# Patient Record
Sex: Female | Born: 1990 | Race: Black or African American | Hispanic: No | Smoking: Never smoker
Health system: Southern US, Community
[De-identification: ages and names within clinical notes are randomized; demographics above are authoritative.]

## PROBLEM LIST (undated history)

## (undated) DIAGNOSIS — E1110 Type 2 diabetes mellitus with ketoacidosis without coma: Secondary | ICD-10-CM

## (undated) DIAGNOSIS — I10 Essential (primary) hypertension: Secondary | ICD-10-CM

## (undated) DIAGNOSIS — T4145XA Adverse effect of unspecified anesthetic, initial encounter: Secondary | ICD-10-CM

## (undated) DIAGNOSIS — T8859XA Other complications of anesthesia, initial encounter: Secondary | ICD-10-CM

## (undated) DIAGNOSIS — N186 End stage renal disease: Secondary | ICD-10-CM

## (undated) DIAGNOSIS — I639 Cerebral infarction, unspecified: Secondary | ICD-10-CM

## (undated) DIAGNOSIS — Z9889 Other specified postprocedural states: Secondary | ICD-10-CM

## (undated) DIAGNOSIS — R112 Nausea with vomiting, unspecified: Secondary | ICD-10-CM

## (undated) DIAGNOSIS — E109 Type 1 diabetes mellitus without complications: Secondary | ICD-10-CM

## (undated) DIAGNOSIS — I469 Cardiac arrest, cause unspecified: Secondary | ICD-10-CM

## (undated) HISTORY — DX: Cardiac arrest, cause unspecified: I46.9

## (undated) HISTORY — PX: CATARACT EXTRACTION: SUR2

## (undated) HISTORY — DX: Type 2 diabetes mellitus with ketoacidosis without coma: E11.10

## (undated) HISTORY — PX: DIALYSIS FISTULA CREATION: SHX611

---

## 2011-05-07 ENCOUNTER — Emergency Department (INDEPENDENT_AMBULATORY_CARE_PROVIDER_SITE_OTHER)
Admission: EM | Admit: 2011-05-07 | Discharge: 2011-05-07 | Disposition: A | Discharge status: Home / Self Care | Payer: BC Managed Care – PPO | Source: Home / Self Care | Attending: Family Medicine | Admitting: Family Medicine

## 2011-05-07 ENCOUNTER — Emergency Department (INDEPENDENT_AMBULATORY_CARE_PROVIDER_SITE_OTHER): Payer: BC Managed Care – PPO

## 2011-05-07 DIAGNOSIS — M79609 Pain in unspecified limb: Secondary | ICD-10-CM

## 2011-05-07 DIAGNOSIS — M79671 Pain in right foot: Secondary | ICD-10-CM

## 2011-05-07 MED ORDER — HYDROCODONE-ACETAMINOPHEN 5-325 MG PO TABS: ORAL_TABLET | ORAL | Status: AC

## 2011-05-07 NOTE — ED Notes (Signed)
Pt states she had high heels on- stepped up on curb and rolled her rt foot last night.  C/o pain, bruising and swelling to rt lateral foot

## 2011-05-07 NOTE — ED Provider Notes (Signed)
History     CSN: [id number] Arrival date & time: 05/07/2011  5:29 PM   First MD Initiated Contact with Patient 05/07/11 1702      Chief Complaint  Patient presents with  . Foot Pain    (Consider location/radiation/quality/duration/timing/severity/associated sxs/prior treatment) Patient is a 20 y.o. female presenting with lower extremity pain. The history is provided by the patient.  Foot Pain This is a new problem. The current episode started 12 to 24 hours ago. The problem occurs constantly. The problem has not changed since onset.The symptoms are aggravated by walking, standing and exertion. The symptoms are relieved by nothing.    Past Medical History  Diagnosis Date  . Diabetes mellitus     Past Surgical History  Procedure Date  . Cataract extraction     No family history on file.  History  Substance Use Topics  . Smoking status: Never Smoker   . Smokeless tobacco: Not on file  . Alcohol Use: Yes     occasional    OB History    Grav Para Term Preterm Abortions TAB SAB Ect Mult Living                  Review of Systems  Constitutional: Negative.   HENT: Negative.   Eyes: Negative.   Respiratory: Negative.   Musculoskeletal: Positive for gait problem.  Neurological: Negative.     Allergies  Review of patient's allergies indicates no known allergies.  Home Medications   Current Outpatient Rx  Name Route Sig Dispense Refill  . IBUPROFEN 200 MG PO TABS Oral Take 400 mg by mouth as needed.        BP 115/79  Pulse 94  Temp(Src) 98.9 F (37.2 C) (Oral)  Resp 16  SpO2 96%  LMP 04/04/2011  Physical Exam  Constitutional: She is oriented to person, place, and time. She appears well-developed and well-nourished.  HENT:  Head: Normocephalic and atraumatic.  Eyes: EOM are normal.  Neck: Normal range of motion.  Musculoskeletal:       Right foot: She exhibits tenderness and bony tenderness. She exhibits normal range of motion.        Feet:  Neurological: She is alert and oriented to person, place, and time.  Skin: Skin is warm and dry.    ED Course  Procedures (including critical care time)  Labs Reviewed - No data to display No results found.   No diagnosis found.    MDM  RIGHT foot xray: negative for fracture or dislocation        Marcell Anger, MD 05/07/11 1836

## 2013-12-05 ENCOUNTER — Inpatient Hospital Stay (HOSPITAL_COMMUNITY)
Admission: EM | Admit: 2013-12-05 | Discharge: 2013-12-09 | DRG: 638 | Disposition: A | Discharge status: Home / Self Care | Payer: Medicaid Other | Attending: Internal Medicine | Admitting: Internal Medicine

## 2013-12-05 ENCOUNTER — Inpatient Hospital Stay (HOSPITAL_COMMUNITY): Payer: No Typology Code available for payment source

## 2013-12-05 ENCOUNTER — Encounter (HOSPITAL_COMMUNITY): Payer: Self-pay | Admitting: Emergency Medicine

## 2013-12-05 DIAGNOSIS — E872 Acidosis, unspecified: Secondary | ICD-10-CM | POA: Diagnosis not present

## 2013-12-05 DIAGNOSIS — E870 Hyperosmolality and hypernatremia: Secondary | ICD-10-CM | POA: Diagnosis present

## 2013-12-05 DIAGNOSIS — E1110 Type 2 diabetes mellitus with ketoacidosis without coma: Secondary | ICD-10-CM | POA: Diagnosis present

## 2013-12-05 DIAGNOSIS — D72829 Elevated white blood cell count, unspecified: Secondary | ICD-10-CM | POA: Diagnosis not present

## 2013-12-05 DIAGNOSIS — E8729 Other acidosis: Secondary | ICD-10-CM

## 2013-12-05 DIAGNOSIS — E1029 Type 1 diabetes mellitus with other diabetic kidney complication: Secondary | ICD-10-CM | POA: Diagnosis present

## 2013-12-05 DIAGNOSIS — E1310 Other specified diabetes mellitus with ketoacidosis without coma: Principal | ICD-10-CM

## 2013-12-05 DIAGNOSIS — R748 Abnormal levels of other serum enzymes: Secondary | ICD-10-CM

## 2013-12-05 DIAGNOSIS — N179 Acute kidney failure, unspecified: Secondary | ICD-10-CM | POA: Diagnosis not present

## 2013-12-05 DIAGNOSIS — E1065 Type 1 diabetes mellitus with hyperglycemia: Secondary | ICD-10-CM

## 2013-12-05 DIAGNOSIS — Z794 Long term (current) use of insulin: Secondary | ICD-10-CM

## 2013-12-05 DIAGNOSIS — N058 Unspecified nephritic syndrome with other morphologic changes: Secondary | ICD-10-CM | POA: Diagnosis present

## 2013-12-05 DIAGNOSIS — Z9119 Patient's noncompliance with other medical treatment and regimen: Secondary | ICD-10-CM

## 2013-12-05 DIAGNOSIS — E860 Dehydration: Secondary | ICD-10-CM

## 2013-12-05 DIAGNOSIS — E1165 Type 2 diabetes mellitus with hyperglycemia: Secondary | ICD-10-CM | POA: Diagnosis present

## 2013-12-05 DIAGNOSIS — N189 Chronic kidney disease, unspecified: Secondary | ICD-10-CM

## 2013-12-05 DIAGNOSIS — R7989 Other specified abnormal findings of blood chemistry: Secondary | ICD-10-CM

## 2013-12-05 DIAGNOSIS — IMO0002 Reserved for concepts with insufficient information to code with codable children: Secondary | ICD-10-CM | POA: Diagnosis present

## 2013-12-05 DIAGNOSIS — E1129 Type 2 diabetes mellitus with other diabetic kidney complication: Secondary | ICD-10-CM

## 2013-12-05 DIAGNOSIS — E876 Hypokalemia: Secondary | ICD-10-CM

## 2013-12-05 DIAGNOSIS — Z91199 Patient's noncompliance with other medical treatment and regimen due to unspecified reason: Secondary | ICD-10-CM

## 2013-12-05 DIAGNOSIS — R945 Abnormal results of liver function studies: Secondary | ICD-10-CM

## 2013-12-05 DIAGNOSIS — R0682 Tachypnea, not elsewhere classified: Secondary | ICD-10-CM | POA: Diagnosis present

## 2013-12-05 LAB — COMPREHENSIVE METABOLIC PANEL
ALBUMIN: 4.2 g/dL (ref 3.5–5.2)
ALK PHOS: 158 U/L — AB (ref 39–117)
ALT: 37 U/L — AB (ref 0–35)
AST: 38 U/L — AB (ref 0–37)
BUN: 17 mg/dL (ref 6–23)
CHLORIDE: 93 meq/L — AB (ref 96–112)
CREATININE: 1.4 mg/dL — AB (ref 0.50–1.10)
Calcium: 10.7 mg/dL — ABNORMAL HIGH (ref 8.4–10.5)
GFR calc Af Amer: 61 mL/min — ABNORMAL LOW (ref >90)
GFR, EST NON AFRICAN AMERICAN: 53 mL/min — AB (ref >90)
Glucose, Bld: 861 mg/dL (ref 70–99)
POTASSIUM: 5.1 meq/L (ref 3.7–5.3)
SODIUM: 142 meq/L (ref 137–147)
Total Protein: 8.8 g/dL — ABNORMAL HIGH (ref 6.0–8.3)

## 2013-12-05 LAB — CBG MONITORING, ED: Glucose-Capillary: >600 mg/dL (ref 70–99)

## 2013-12-05 LAB — URINALYSIS, ROUTINE W REFLEX MICROSCOPIC
LEUKOCYTES UA: NEGATIVE
Nitrite: NEGATIVE
PH: 5 (ref 5.0–8.0)
PROTEIN: 100 mg/dL — AB
Specific Gravity, Urine: 1.027 (ref 1.005–1.030)
Urobilinogen, UA: 0.2 mg/dL (ref 0.0–1.0)

## 2013-12-05 LAB — CBC WITH DIFFERENTIAL/PLATELET
BASOS ABS: 0 10*3/uL (ref 0.0–0.1)
BASOS PCT: 0 % (ref 0–1)
Eosinophils Absolute: 0 10*3/uL (ref 0.0–0.7)
Eosinophils Relative: 0 % (ref 0–5)
HEMATOCRIT: 36.4 % (ref 36.0–46.0)
Hemoglobin: 11.9 g/dL — ABNORMAL LOW (ref 12.0–15.0)
LYMPHS PCT: 7 % — AB (ref 12–46)
Lymphs Abs: 0.8 10*3/uL (ref 0.7–4.0)
MCH: 29.8 pg (ref 26.0–34.0)
MCHC: 32.7 g/dL (ref 30.0–36.0)
MCV: 91 fL (ref 78.0–100.0)
MONO ABS: 1.2 10*3/uL — AB (ref 0.1–1.0)
Monocytes Relative: 10 % (ref 3–12)
NEUTROS ABS: 9.9 10*3/uL — AB (ref 1.7–7.7)
NEUTROS PCT: 83 % — AB (ref 43–77)
Platelets: 431 10*3/uL — ABNORMAL HIGH (ref 150–400)
RBC: 4 MIL/uL (ref 3.87–5.11)
RDW: 13.5 % (ref 11.5–15.5)
WBC: 11.9 10*3/uL — AB (ref 4.0–10.5)

## 2013-12-05 LAB — CBC
HCT: 31.8 % — ABNORMAL LOW (ref 36.0–46.0)
HEMOGLOBIN: 10.3 g/dL — AB (ref 12.0–15.0)
MCH: 29.6 pg (ref 26.0–34.0)
MCHC: 32.4 g/dL (ref 30.0–36.0)
MCV: 91.4 fL (ref 78.0–100.0)
Platelets: 355 10*3/uL (ref 150–400)
RBC: 3.48 MIL/uL — AB (ref 3.87–5.11)
RDW: 13.7 % (ref 11.5–15.5)
WBC: 12.2 10*3/uL — ABNORMAL HIGH (ref 4.0–10.5)

## 2013-12-05 LAB — LIPASE, BLOOD: Lipase: 1455 U/L — ABNORMAL HIGH (ref 11–59)

## 2013-12-05 LAB — BASIC METABOLIC PANEL
BUN: 18 mg/dL (ref 6–23)
CO2: <7 mEq/L — CL (ref 19–32)
Calcium: 9.2 mg/dL (ref 8.4–10.5)
Chloride: 99 mEq/L (ref 96–112)
Creatinine, Ser: 1.29 mg/dL — ABNORMAL HIGH (ref 0.50–1.10)
GFR calc Af Amer: 68 mL/min — ABNORMAL LOW (ref >90)
GFR, EST NON AFRICAN AMERICAN: 58 mL/min — AB (ref >90)
Glucose, Bld: 840 mg/dL (ref 70–99)
POTASSIUM: 5.1 meq/L (ref 3.7–5.3)
Sodium: 144 mEq/L (ref 137–147)

## 2013-12-05 LAB — I-STAT VENOUS BLOOD GAS, ED
Acid-base deficit: 21 mmol/L — ABNORMAL HIGH (ref 0.0–2.0)
BICARBONATE: 5.9 meq/L — AB (ref 20.0–24.0)
O2 Saturation: 71 %
PCO2 VEN: 18 mmHg — AB (ref 45.0–50.0)
PH VEN: 7.126 — AB (ref 7.250–7.300)
TCO2: 6 mmol/L (ref 0–100)
pO2, Ven: 47 mmHg — ABNORMAL HIGH (ref 30.0–45.0)

## 2013-12-05 LAB — URINE MICROSCOPIC-ADD ON

## 2013-12-05 LAB — POC URINE PREG, ED: Preg Test, Ur: NEGATIVE

## 2013-12-05 LAB — ETHANOL: Alcohol, Ethyl (B): <11 mg/dL (ref 0–11)

## 2013-12-05 LAB — KETONES, QUALITATIVE

## 2013-12-05 MED ORDER — ACETAMINOPHEN 325 MG PO TABS: 650.0000 mg | ORAL_TABLET | Freq: Four times a day (QID) | ORAL | Status: DC | PRN

## 2013-12-05 MED ORDER — ONDANSETRON HCL 4 MG PO TABS: 4.0000 mg | ORAL_TABLET | Freq: Four times a day (QID) | ORAL | Status: DC | PRN

## 2013-12-05 MED ORDER — DEXTROSE-NACL 5-0.45 % IV SOLN: INTRAVENOUS | Status: DC

## 2013-12-05 MED ORDER — ONDANSETRON HCL 4 MG/2ML IJ SOLN
4.0000 mg | Freq: Four times a day (QID) | INTRAMUSCULAR | Status: DC | PRN
  Administered 2013-12-06 (×2): 4 mg via INTRAVENOUS
  Filled 2013-12-05 (×2): qty 2

## 2013-12-05 MED ORDER — MORPHINE SULFATE 2 MG/ML IJ SOLN
2.0000 mg | INTRAMUSCULAR | Status: DC | PRN
  Administered 2013-12-05: 2 mg via INTRAVENOUS
  Filled 2013-12-05: qty 1

## 2013-12-05 MED ORDER — SODIUM CHLORIDE 0.9 % IV BOLUS (SEPSIS)
1000.0000 mL | Freq: Once | INTRAVENOUS | Status: AC
  Administered 2013-12-05: 1000 mL via INTRAVENOUS

## 2013-12-05 MED ORDER — DEXTROSE 50 % IV SOLN: 25.0000 mL | INTRAVENOUS | Status: DC | PRN

## 2013-12-05 MED ORDER — SODIUM CHLORIDE 0.9 % IV SOLN
Freq: Once | INTRAVENOUS | Status: AC
  Administered 2013-12-05: 22:00:00 via INTRAVENOUS

## 2013-12-05 MED ORDER — ACETAMINOPHEN 650 MG RE SUPP: 650.0000 mg | Freq: Four times a day (QID) | RECTAL | Status: DC | PRN

## 2013-12-05 MED ORDER — ONDANSETRON HCL 4 MG/2ML IJ SOLN
4.0000 mg | Freq: Once | INTRAMUSCULAR | Status: AC
  Administered 2013-12-05: 4 mg via INTRAVENOUS
  Filled 2013-12-05: qty 2

## 2013-12-05 MED ORDER — SODIUM CHLORIDE 0.9 % IV BOLUS (SEPSIS)
1000.0000 mL | Freq: Once | INTRAVENOUS | Status: AC
Start: 2013-12-05 — End: 2013-12-05
  Administered 2013-12-05: 1000 mL via INTRAVENOUS

## 2013-12-05 MED ORDER — ONDANSETRON 4 MG PO TBDP
8.0000 mg | ORAL_TABLET | Freq: Once | ORAL | Status: AC
  Administered 2013-12-05: 8 mg via ORAL
  Filled 2013-12-05: qty 2

## 2013-12-05 MED ORDER — HYDROCODONE-ACETAMINOPHEN 5-325 MG PO TABS: 1.0000 | ORAL_TABLET | ORAL | Status: DC | PRN

## 2013-12-05 MED ORDER — SODIUM CHLORIDE 0.9 % IV SOLN
INTRAVENOUS | Status: DC
Start: 2013-12-05 — End: 2013-12-06
  Administered 2013-12-05: 23:00:00 via INTRAVENOUS

## 2013-12-05 MED ORDER — METOCLOPRAMIDE HCL 5 MG/ML IJ SOLN
10.0000 mg | Freq: Once | INTRAMUSCULAR | Status: AC
  Administered 2013-12-05: 10 mg via INTRAVENOUS
  Filled 2013-12-05: qty 2

## 2013-12-05 MED ORDER — DEXTROSE-NACL 5-0.45 % IV SOLN
INTRAVENOUS | Status: DC
  Administered 2013-12-06: 05:00:00 via INTRAVENOUS

## 2013-12-05 MED ORDER — SODIUM CHLORIDE 0.9 % IV SOLN
INTRAVENOUS | Status: DC
  Filled 2013-12-05: qty 1

## 2013-12-05 MED ORDER — SODIUM CHLORIDE 0.9 % IJ SOLN
3.0000 mL | Freq: Two times a day (BID) | INTRAMUSCULAR | Status: DC
Start: 2013-12-05 — End: 2013-12-09
  Administered 2013-12-06 – 2013-12-09 (×6): 3 mL via INTRAVENOUS

## 2013-12-05 MED ORDER — SODIUM CHLORIDE 0.9 % IV SOLN
INTRAVENOUS | Status: DC
  Administered 2013-12-05: 7.8 [IU]/h via INTRAVENOUS
  Administered 2013-12-06: 4.5 [IU]/h via INTRAVENOUS
  Filled 2013-12-05 (×3): qty 1

## 2013-12-05 MED ORDER — MORPHINE SULFATE 2 MG/ML IJ SOLN: 1.0000 mg | INTRAMUSCULAR | Status: DC | PRN

## 2013-12-05 NOTE — ED Notes (Signed)
Sent urine rapid drug screen down to the lab as an add on

## 2013-12-05 NOTE — ED Notes (Addendum)
Pt reports being diabetic and having n/v x 12 hours. cbg "high" at triage.

## 2013-12-05 NOTE — ED Notes (Signed)
Attempted blood draw at triage x 1 with no success.

## 2013-12-05 NOTE — ED Notes (Signed)
Pt placed into gown and on monitor upon arrival to room. Pt monitored by 5 lead, blood pressure, and pulse ox.  

## 2013-12-05 NOTE — ED Provider Notes (Signed)
CSN: [id number]     Arrival date & time 12/05/13  1809 History   First MD Initiated Contact with Patient 12/05/13 1902     Chief Complaint  Patient presents with  . Emesis      HPI  Patient presents with vomiting. She has a history of diabetes.  She does not have a local physician. She follows with an endocrinologist at [facility]. She reports not feeling well since yesterday with nausea. Denies pain. Started vomiting yesterday afternoon. The use of insulin last night. States her blood sugars over 500. She gave herself her irregular dosing, plus sliding scale. Awakened this morning after only about 2-3 hours of sleep secondary to refractory nausea and multiple episodes of vomiting. Her blood sugar was "high". She gave herself her morning insulin, plus another unspecified amount of regular insulin via her sliding scale. Nausea has persisted through the day with 3 or 4 episodes of additional vomiting. She is fatigued still nauseated. Said she feels like she has felt in the past when she has been in diabetic ketoacidosis.  Past Medical History  Diagnosis Date  . Diabetes mellitus    Past Surgical History  Procedure Laterality Date  . Cataract extraction     History reviewed. No pertinent family history. History  Substance Use Topics  . Smoking status: Never Smoker   . Smokeless tobacco: Not on file  . Alcohol Use: Yes     Comment: occasional   OB History   Grav Para Term Preterm Abortions TAB SAB Ect Mult Living                 Review of Systems  Constitutional: Positive for fatigue. Negative for fever, chills, diaphoresis and appetite change.  HENT: Negative for mouth sores, sore throat and trouble swallowing.   Eyes: Negative for visual disturbance.  Respiratory: Negative for cough, chest tightness, shortness of breath and wheezing.   Cardiovascular: Negative for chest pain.  Gastrointestinal: Positive for nausea and vomiting. Negative for abdominal pain, diarrhea and abdominal  distention.  Endocrine: Negative for polydipsia, polyphagia and polyuria.  Genitourinary: Negative for dysuria, frequency and hematuria.  Musculoskeletal: Negative for gait problem.  Skin: Negative for color change, pallor and rash.  Neurological: Positive for weakness. Negative for dizziness, syncope, light-headedness and headaches.  Hematological: Does not bruise/bleed easily.  Psychiatric/Behavioral: Negative for behavioral problems and confusion.      Allergies  Review of patient's allergies indicates no known allergies.  Home Medications   Prior to Admission medications   Medication Sig Start Date End Date Taking? Authorizing Provider  ibuprofen (ADVIL,MOTRIN) 200 MG tablet Take 400 mg by mouth as needed.     Yes Historical Provider, MD  insulin aspart (NOVOLOG) 100 UNIT/ML injection Inject into the skin 3 (three) times daily before meals. Sliding scale   Yes Historical Provider, MD  Insulin Aspart Prot & Aspart (NOVOLOG MIX 70/30 [place]) Inject 22-44 Units into the skin 2 (two) times daily. 44 units every morning and 22 units at bed time   Yes Historical Provider, MD   BP 128/77  Pulse 119  Temp(Src) 97.5 F (36.4 C) (Oral)  Resp 22  Ht 5\' 2"  (1.575 m)  SpO2 100%  LMP 12/05/2013 Physical Exam  Constitutional:  Pleasant thin female. Minimally tachypneic. Alert and oriented. Does not appear overtly ill.  Pulmonary/Chest:   Minimal tachypnea.  Abdominal:  Soft benign abdomen. Slightly hypoactive bowel sounds.    ED Course  CRITICAL CARE Performed by: JAMES, [place] by: Jeneen Rinks,  MARK Critical care start time: 12/05/2013 7:57 PM Critical care end time: 12/05/2013 8:57 PM Critical care time was exclusive of separately billable procedures and treating other patients. Critical care was necessary to treat or prevent imminent or life-threatening deterioration of the following conditions: metabolic crisis. Critical care was time spent personally by me on the following  activities: blood draw for specimens, development of treatment plan with patient or surrogate, discussions with consultants, evaluation of patient's response to treatment, examination of patient, obtaining history from patient or surrogate, ordering and review of laboratory studies and re-evaluation of patient's condition.   (including critical care time) Labs Review Labs Reviewed  CBC WITH DIFFERENTIAL - Abnormal; Notable for the following:    WBC 11.9 (*)    Hemoglobin 11.9 (*)    Platelets 431 (*)    Neutrophils Relative % 83 (*)    Neutro Abs 9.9 (*)    Lymphocytes Relative 7 (*)    Monocytes Absolute 1.2 (*)    All other components within normal limits  COMPREHENSIVE METABOLIC PANEL - Abnormal; Notable for the following:    Chloride 93 (*)    CO2 <7 (*)    Glucose, Bld 861 (*)    Creatinine, Ser 1.40 (*)    Calcium 10.7 (*)    Total Protein 8.8 (*)    AST 38 (*)    ALT 37 (*)    Alkaline Phosphatase 158 (*)    Total Bilirubin <0.2 (*)    GFR calc non Af Amer 53 (*)    GFR calc Af Amer 61 (*)    All other components within normal limits  URINALYSIS, ROUTINE W REFLEX MICROSCOPIC - Abnormal; Notable for the following:    Glucose, UA >1000 (*)    Hgb urine dipstick LARGE (*)    Bilirubin Urine SMALL (*)    Ketones, ur >80 (*)    Protein, ur 100 (*)    All other components within normal limits  LIPASE, BLOOD - Abnormal; Notable for the following:    Lipase 1455 (*)    All other components within normal limits  KETONES, QUALITATIVE - Abnormal; Notable for the following:    Acetone, Bld MODERATE (*)    All other components within normal limits  URINE MICROSCOPIC-ADD ON - Abnormal; Notable for the following:    Squamous Epithelial / LPF FEW (*)    Bacteria, UA FEW (*)    All other components within normal limits  CBG MONITORING, ED - Abnormal; Notable for the following:    Glucose-Capillary >600 (*)    All other components within normal limits  I-STAT VENOUS BLOOD GAS, ED  - Abnormal; Notable for the following:    pH, Ven 7.126 (*)    pCO2, Ven 18.0 (*)    pO2, Ven 47.0 (*)    Bicarbonate 5.9 (*)    Acid-base deficit 21.0 (*)    All other components within normal limits  BLOOD GAS, VENOUS  POC URINE PREG, ED    Imaging Review No results found.   EKG Interpretation None      MDM   Final diagnoses:  Ketoacidosis  Dehydration    Clinically patient is mentating well. Not encephalopathic. Not fatigue or altered. Minimally tachypneic. She has an anion gap of 42.  Has been given normal saline 1 L bolus x2, continues on normal saline infusion. Continues on an insulin drip per glucose stabilizer protocol. Plan is admission.    Tanna Furry, MD 12/05/13 2104

## 2013-12-05 NOTE — H&P (Signed)
[facility] History and Physical  [name] [id number] DOB: 02-11-1991 DOA: 12/05/2013  Referring physician: ER physician PCP: No primary provider on file.   Chief Complaint: "not feeling so good"  HPI:  23 year old female with past medical history of diabetes (has an endocrinologist at [facility]) who presented to [facility] ED 12/05/2013 with reports of nausea and vomiting started about 1 day prior to this admission. Her CBG at home was in 500 range when she checked it. She reported she used her insulin as prescribed but her CBG's still read high. She has very minimal PO intake and was severely nauseas. No chest pain, no shortness of breath or palpitations. No reprots of blood in stool or urine. No fevers or chills. NO GU complaints.    In ED, vitals are as follows: BP 114/71, HR 117; RR 18; T max 97.5 F and oxygen saturation 100% on room air. Blood work revealed glucose of 861, AG of 42, CBG > 600, venous blood gas with pH of 7.13 and bicarbonate 5.9; on BMP she was further found to have CO2 of less than 7, creatinine 1.4, calcium 10.7, AST 38, ALT 37, ALP 158, lipase 1455. UA showed glucose >1,000, ketones > 80, negative leukocytes or nitrites.   Assessment & Plan    Principal Problem:   DKA (diabetic ketoacidoses) - DKA order set in place; IV fluids (NS bolus given in ED), continue maintenance IV fluids with CGB's check Q 1 hour and BMP Q 2 hours until AG closes or bicarbonate improves - regular insulin drip started in ED - potassium not required since it is 5.1  - check A1c - admission to stepdown unit for further management of DKA   Active Problems:   Leukocytosis - likely reactive; UA unremarkable; no fevers - follow up CBC in am   Acute renal failure - secondary to prerenal etiology, dehydration - give IV fluids - follow up renal function test in am   Hypercalcemia - likely due to dehydration - will give IV fluids since she is being treated for DKA and see if any  improvement by am   Abnormal LFTs - unclear etiology; obtain abdominal US for further evaluation - follow up acute hepatitis panel; UDS, alcohol level - trend LFT's   Elevated lipase - lipase on admission 1455; did not get CT abdomen due to acute renal failure. Hopefully with hydration renal fxn will improve and allow CT abd with contrast for further evaluation of possible pancreatitis.    DVT prophylaxis: SCD's bilaterally   Radiological Exams on Admission: No results found.  Code Status: Full Family Communication: Plan of care discussed with the patient  Disposition Plan: Admit for further evaluation  Leisa Lenz, MD  [facility] Pager [phone]  Review of Systems:  Constitutional: Negative for fever, chills and positive for malaise/fatigue. Negative for diaphoresis.  HENT: Negative for hearing loss, ear pain, nosebleeds, congestion, sore throat, neck pain, tinnitus and ear discharge.   Eyes: Negative for blurred vision, double vision, photophobia, pain, discharge and redness.  Respiratory: Negative for cough, hemoptysis, sputum production, shortness of breath, wheezing and stridor.   Cardiovascular: Negative for chest pain, palpitations, orthopnea, claudication and leg swelling.  Gastrointestinal: positive for nausea, vomiting but no abdominal pain. Negative for heartburn, constipation, blood in stool and melena.  Genitourinary: Negative for dysuria, urgency, frequency, hematuria and flank pain.  Musculoskeletal: Negative for myalgias, back pain, joint pain and falls.  Skin: Negative for itching and rash.  Neurological: Negative for  dizziness and positive for weakness. Negative for tingling, tremors, sensory change, speech change, focal weakness, loss of consciousness and headaches.  Endo/Heme/Allergies: Negative for environmental allergies and polydipsia. Does not bruise/bleed easily.  Psychiatric/Behavioral: Negative for suicidal ideas. The patient is not nervous/anxious.       Past Medical History  Diagnosis Date  . Diabetes mellitus    Past Surgical History  Procedure Laterality Date  . Cataract extraction     Social History:  reports that she has never smoked. She does not have any smokeless tobacco history on file. She reports that she drinks alcohol. She reports that she does not use illicit drugs.  No Known Allergies  Family History: Family medical history significant for HTN, HLD   Prior to Admission medications   Medication Sig Start Date End Date Taking? Authorizing Provider  ibuprofen (ADVIL,MOTRIN) 200 MG tablet Take 400 mg by mouth as needed.     Yes Historical Provider, MD  insulin aspart (NOVOLOG) 100 UNIT/ML injection Inject into the skin 3 (three) times daily before meals. Sliding scale   Yes Historical Provider, MD  Insulin Aspart Prot & Aspart (NOVOLOG MIX 70/30 [place]) Inject 22-44 Units into the skin 2 (two) times daily. 44 units every morning and 22 units at bed time   Yes Historical Provider, MD   Physical Exam: Filed Vitals:   12/05/13 1849 12/05/13 1912 12/05/13 2045  BP: 114/71 119/76 128/77  Pulse: 120 119 119  Temp: 97.5 F (36.4 C)    TempSrc: Oral    Resp: 18 20 22   Height: 5\' 2"  (1.575 m)    SpO2: 100% 100% 100%    Physical Exam  Constitutional: Appears well-developed and well-nourished. No distress.  HENT: Normocephalic. No tonsillar erythema or exudates Eyes: Conjunctivae and EOM are normal. PERRLA, no scleral icterus.  Neck: Normal ROM. Neck supple. No JVD. No tracheal deviation. No thyromegaly.  CVS: RRR, S1/S2 +, no murmurs, no gallops, no carotid bruit.  Pulmonary: Effort and breath sounds normal, no stridor, rhonchi, wheezes, rales.  Abdominal: Soft. BS +,  no distension, tenderness, rebound or guarding.  Musculoskeletal: Normal range of motion. No edema and no tenderness.  Lymphadenopathy: No lymphadenopathy noted, cervical, inguinal. Neuro: Alert. Normal reflexes, muscle tone coordination. No focal  neurologic deficits. Skin: Skin is warm and dry. No rash noted. Not diaphoretic. No erythema. No pallor.  Psychiatric: Normal mood and affect. Behavior, judgment, thought content normal.   Labs on Admission:  Basic Metabolic Panel:  Recent Labs Lab 12/05/13 1937  NA 142  K 5.1  CL 93*  CO2 <7*  GLUCOSE 861*  BUN 17  CREATININE 1.40*  CALCIUM 10.7*   Liver Function Tests:  Recent Labs Lab 12/05/13 1937  AST 38*  ALT 37*  ALKPHOS 158*  BILITOT <0.2*  PROT 8.8*  ALBUMIN 4.2    Recent Labs Lab 12/05/13 1937  LIPASE 1455*   No results found for this basename: AMMONIA,  in the last 168 hours CBC:  Recent Labs Lab 12/05/13 1937  WBC 11.9*  NEUTROABS 9.9*  HGB 11.9*  HCT 36.4  MCV 91.0  PLT 431*   Cardiac Enzymes: No results found for this basename: CKTOTAL, CKMB, CKMBINDEX, TROPONINI,  in the last 168 hours BNP: No components found with this basename: POCBNP,  CBG:  Recent Labs Lab 12/05/13 1856  GLUCAP >600*    If 7PM-7AM, please contact night-coverage [email or web address] Password [email or web address] 12/05/2013, 9:07 PM

## 2013-12-06 DIAGNOSIS — Z9119 Patient's noncompliance with other medical treatment and regimen: Secondary | ICD-10-CM

## 2013-12-06 DIAGNOSIS — Z91199 Patient's noncompliance with other medical treatment and regimen due to unspecified reason: Secondary | ICD-10-CM

## 2013-12-06 DIAGNOSIS — R7989 Other specified abnormal findings of blood chemistry: Secondary | ICD-10-CM

## 2013-12-06 DIAGNOSIS — N179 Acute kidney failure, unspecified: Secondary | ICD-10-CM | POA: Diagnosis not present

## 2013-12-06 DIAGNOSIS — E872 Acidosis, unspecified: Secondary | ICD-10-CM | POA: Diagnosis not present

## 2013-12-06 DIAGNOSIS — E1310 Other specified diabetes mellitus with ketoacidosis without coma: Principal | ICD-10-CM

## 2013-12-06 DIAGNOSIS — R748 Abnormal levels of other serum enzymes: Secondary | ICD-10-CM

## 2013-12-06 HISTORY — DX: Patient's noncompliance with other medical treatment and regimen due to unspecified reason: Z91.199

## 2013-12-06 LAB — COMPREHENSIVE METABOLIC PANEL
ALBUMIN: 3.6 g/dL (ref 3.5–5.2)
ALK PHOS: 136 U/L — AB (ref 39–117)
ALT: 33 U/L (ref 0–35)
AST: 30 U/L (ref 0–37)
BUN: 18 mg/dL (ref 6–23)
CALCIUM: 9.4 mg/dL (ref 8.4–10.5)
CO2: <7 mEq/L — CL (ref 19–32)
Chloride: 111 mEq/L (ref 96–112)
Creatinine, Ser: 1.36 mg/dL — ABNORMAL HIGH (ref 0.50–1.10)
GFR calc Af Amer: 63 mL/min — ABNORMAL LOW (ref >90)
GFR calc non Af Amer: 55 mL/min — ABNORMAL LOW (ref >90)
Glucose, Bld: 446 mg/dL — ABNORMAL HIGH (ref 70–99)
POTASSIUM: 4.5 meq/L (ref 3.7–5.3)
Sodium: 154 mEq/L — ABNORMAL HIGH (ref 137–147)
Total Bilirubin: <0.2 mg/dL — ABNORMAL LOW (ref 0.3–1.2)
Total Protein: 7.6 g/dL (ref 6.0–8.3)

## 2013-12-06 LAB — BASIC METABOLIC PANEL
BUN: 17 mg/dL (ref 6–23)
BUN: 18 mg/dL (ref 6–23)
BUN: 18 mg/dL (ref 6–23)
BUN: 17 mg/dL (ref 6–23)
BUN: 16 mg/dL (ref 6–23)
BUN: 15 mg/dL (ref 6–23)
BUN: 15 mg/dL (ref 6–23)
BUN: 15 mg/dL (ref 6–23)
BUN: 14 mg/dL (ref 6–23)
BUN: 13 mg/dL (ref 6–23)
CALCIUM: 8.9 mg/dL (ref 8.4–10.5)
CALCIUM: 9.4 mg/dL (ref 8.4–10.5)
CALCIUM: 9.4 mg/dL (ref 8.4–10.5)
CALCIUM: 9.5 mg/dL (ref 8.4–10.5)
CALCIUM: 9.2 mg/dL (ref 8.4–10.5)
CALCIUM: 9.3 mg/dL (ref 8.4–10.5)
CHLORIDE: 115 meq/L — AB (ref 96–112)
CO2: <7 mEq/L — CL (ref 19–32)
CO2: <7 mEq/L — CL (ref 19–32)
CO2: <7 mEq/L — CL (ref 19–32)
CO2: 14 mEq/L — ABNORMAL LOW (ref 19–32)
CO2: 15 meq/L — AB (ref 19–32)
CO2: 19 meq/L (ref 19–32)
CO2: 18 mEq/L — ABNORMAL LOW (ref 19–32)
CO2: 18 mEq/L — ABNORMAL LOW (ref 19–32)
CO2: 16 mEq/L — ABNORMAL LOW (ref 19–32)
CO2: 20 mEq/L (ref 19–32)
CREATININE: 1.13 mg/dL — AB (ref 0.50–1.10)
CREATININE: 1.46 mg/dL — AB (ref 0.50–1.10)
CREATININE: 1.44 mg/dL — AB (ref 0.50–1.10)
CREATININE: 1.53 mg/dL — AB (ref 0.50–1.10)
CREATININE: 1.57 mg/dL — AB (ref 0.50–1.10)
Calcium: 9.4 mg/dL (ref 8.4–10.5)
Calcium: 9.6 mg/dL (ref 8.4–10.5)
Calcium: 9.4 mg/dL (ref 8.4–10.5)
Calcium: 9.2 mg/dL (ref 8.4–10.5)
Chloride: 100 mEq/L (ref 96–112)
Chloride: 105 mEq/L (ref 96–112)
Chloride: 110 mEq/L (ref 96–112)
Chloride: 115 mEq/L — ABNORMAL HIGH (ref 96–112)
Chloride: 115 mEq/L — ABNORMAL HIGH (ref 96–112)
Chloride: 115 mEq/L — ABNORMAL HIGH (ref 96–112)
Chloride: 111 mEq/L (ref 96–112)
Chloride: 111 mEq/L (ref 96–112)
Chloride: 105 mEq/L (ref 96–112)
Creatinine, Ser: 1.29 mg/dL — ABNORMAL HIGH (ref 0.50–1.10)
Creatinine, Ser: 1.39 mg/dL — ABNORMAL HIGH (ref 0.50–1.10)
Creatinine, Ser: 1.53 mg/dL — ABNORMAL HIGH (ref 0.50–1.10)
Creatinine, Ser: 1.58 mg/dL — ABNORMAL HIGH (ref 0.50–1.10)
Creatinine, Ser: 1.59 mg/dL — ABNORMAL HIGH (ref 0.50–1.10)
GFR calc Af Amer: 79 mL/min — ABNORMAL LOW (ref >90)
GFR calc Af Amer: 58 mL/min — ABNORMAL LOW (ref >90)
GFR calc Af Amer: 59 mL/min — ABNORMAL LOW (ref >90)
GFR calc Af Amer: 55 mL/min — ABNORMAL LOW (ref >90)
GFR calc Af Amer: 53 mL/min — ABNORMAL LOW (ref >90)
GFR calc Af Amer: 52 mL/min — ABNORMAL LOW (ref >90)
GFR calc non Af Amer: 68 mL/min — ABNORMAL LOW (ref >90)
GFR calc non Af Amer: 53 mL/min — ABNORMAL LOW (ref >90)
GFR calc non Af Amer: 50 mL/min — ABNORMAL LOW (ref >90)
GFR calc non Af Amer: 51 mL/min — ABNORMAL LOW (ref >90)
GFR calc non Af Amer: 47 mL/min — ABNORMAL LOW (ref >90)
GFR calc non Af Amer: 46 mL/min — ABNORMAL LOW (ref >90)
GFR calc non Af Amer: 45 mL/min — ABNORMAL LOW (ref >90)
GFR, EST AFRICAN AMERICAN: 68 mL/min — AB (ref >90)
GFR, EST AFRICAN AMERICAN: 62 mL/min — AB (ref >90)
GFR, EST AFRICAN AMERICAN: 55 mL/min — AB (ref >90)
GFR, EST AFRICAN AMERICAN: 53 mL/min — AB (ref >90)
GFR, EST NON AFRICAN AMERICAN: 58 mL/min — AB (ref >90)
GFR, EST NON AFRICAN AMERICAN: 46 mL/min — AB (ref >90)
GFR, EST NON AFRICAN AMERICAN: 47 mL/min — AB (ref >90)
GLUCOSE: 238 mg/dL — AB (ref 70–99)
GLUCOSE: 156 mg/dL — AB (ref 70–99)
GLUCOSE: 179 mg/dL — AB (ref 70–99)
GLUCOSE: 166 mg/dL — AB (ref 70–99)
Glucose, Bld: 790 mg/dL (ref 70–99)
Glucose, Bld: 610 mg/dL (ref 70–99)
Glucose, Bld: 452 mg/dL — ABNORMAL HIGH (ref 70–99)
Glucose, Bld: 197 mg/dL — ABNORMAL HIGH (ref 70–99)
Glucose, Bld: 169 mg/dL — ABNORMAL HIGH (ref 70–99)
Glucose, Bld: 197 mg/dL — ABNORMAL HIGH (ref 70–99)
POTASSIUM: 4.5 meq/L (ref 3.7–5.3)
POTASSIUM: 3.7 meq/L (ref 3.7–5.3)
Potassium: 5.4 mEq/L — ABNORMAL HIGH (ref 3.7–5.3)
Potassium: 4.4 mEq/L (ref 3.7–5.3)
Potassium: 3.8 mEq/L (ref 3.7–5.3)
Potassium: 3.7 mEq/L (ref 3.7–5.3)
Potassium: 3.9 mEq/L (ref 3.7–5.3)
Potassium: 3.5 mEq/L — ABNORMAL LOW (ref 3.7–5.3)
Potassium: 3.7 mEq/L (ref 3.7–5.3)
Potassium: 3 mEq/L — ABNORMAL LOW (ref 3.7–5.3)
SODIUM: 144 meq/L (ref 137–147)
Sodium: 141 mEq/L (ref 137–147)
Sodium: 148 mEq/L — ABNORMAL HIGH (ref 137–147)
Sodium: 152 mEq/L — ABNORMAL HIGH (ref 137–147)
Sodium: 153 mEq/L — ABNORMAL HIGH (ref 137–147)
Sodium: 151 mEq/L — ABNORMAL HIGH (ref 137–147)
Sodium: 149 mEq/L — ABNORMAL HIGH (ref 137–147)
Sodium: 149 mEq/L — ABNORMAL HIGH (ref 137–147)
Sodium: 147 mEq/L (ref 137–147)
Sodium: 144 mEq/L (ref 137–147)

## 2013-12-06 LAB — RAPID URINE DRUG SCREEN, HOSP PERFORMED
Amphetamines: NOT DETECTED
Barbiturates: NOT DETECTED
Benzodiazepines: NOT DETECTED
COCAINE: NOT DETECTED
OPIATES: NOT DETECTED
TETRAHYDROCANNABINOL: NOT DETECTED

## 2013-12-06 LAB — GLUCOSE, CAPILLARY
GLUCOSE-CAPILLARY: 141 mg/dL — AB (ref 70–99)
GLUCOSE-CAPILLARY: 149 mg/dL — AB (ref 70–99)
GLUCOSE-CAPILLARY: 163 mg/dL — AB (ref 70–99)
GLUCOSE-CAPILLARY: 210 mg/dL — AB (ref 70–99)
GLUCOSE-CAPILLARY: 219 mg/dL — AB (ref 70–99)
GLUCOSE-CAPILLARY: 429 mg/dL — AB (ref 70–99)
GLUCOSE-CAPILLARY: 546 mg/dL — AB (ref 70–99)
GLUCOSE-CAPILLARY: 564 mg/dL — AB (ref 70–99)
Glucose-Capillary: 342 mg/dL — ABNORMAL HIGH (ref 70–99)
Glucose-Capillary: 258 mg/dL — ABNORMAL HIGH (ref 70–99)
Glucose-Capillary: 207 mg/dL — ABNORMAL HIGH (ref 70–99)
Glucose-Capillary: 181 mg/dL — ABNORMAL HIGH (ref 70–99)
Glucose-Capillary: 149 mg/dL — ABNORMAL HIGH (ref 70–99)
Glucose-Capillary: 170 mg/dL — ABNORMAL HIGH (ref 70–99)
Glucose-Capillary: 133 mg/dL — ABNORMAL HIGH (ref 70–99)
Glucose-Capillary: 122 mg/dL — ABNORMAL HIGH (ref 70–99)
Glucose-Capillary: 162 mg/dL — ABNORMAL HIGH (ref 70–99)
Glucose-Capillary: 137 mg/dL — ABNORMAL HIGH (ref 70–99)
Glucose-Capillary: 169 mg/dL — ABNORMAL HIGH (ref 70–99)
Glucose-Capillary: 141 mg/dL — ABNORMAL HIGH (ref 70–99)
Glucose-Capillary: 174 mg/dL — ABNORMAL HIGH (ref 70–99)

## 2013-12-06 LAB — CBC WITH DIFFERENTIAL/PLATELET
Basophils Absolute: 0 10*3/uL (ref 0.0–0.1)
Basophils Relative: 0 % (ref 0–1)
Eosinophils Absolute: 0 10*3/uL (ref 0.0–0.7)
Eosinophils Relative: 0 % (ref 0–5)
HCT: 34.2 % — ABNORMAL LOW (ref 36.0–46.0)
Hemoglobin: 10.8 g/dL — ABNORMAL LOW (ref 12.0–15.0)
Lymphocytes Relative: 9 % — ABNORMAL LOW (ref 12–46)
Lymphs Abs: 1.5 10*3/uL (ref 0.7–4.0)
MCH: 29.1 pg (ref 26.0–34.0)
MCHC: 31.6 g/dL (ref 30.0–36.0)
MCV: 92.2 fL (ref 78.0–100.0)
MONOS PCT: 8 % (ref 3–12)
Monocytes Absolute: 1.3 10*3/uL — ABNORMAL HIGH (ref 0.1–1.0)
NEUTROS PCT: 83 % — AB (ref 43–77)
Neutro Abs: 13.5 10*3/uL — ABNORMAL HIGH (ref 1.7–7.7)
PLATELETS: 391 10*3/uL (ref 150–400)
RBC: 3.71 MIL/uL — AB (ref 3.87–5.11)
RDW: 13.5 % (ref 11.5–15.5)
WBC MORPHOLOGY: INCREASED
WBC: 16.3 10*3/uL — AB (ref 4.0–10.5)

## 2013-12-06 LAB — LIPID PANEL
CHOLESTEROL: 186 mg/dL (ref 0–200)
HDL: 97 mg/dL (ref >39)
LDL Cholesterol: 69 mg/dL (ref 0–99)
TRIGLYCERIDES: 101 mg/dL (ref <150)
Total CHOL/HDL Ratio: 1.9 RATIO
VLDL: 20 mg/dL (ref 0–40)

## 2013-12-06 LAB — PROTIME-INR
INR: 1.28 (ref 0.00–1.49)
Prothrombin Time: 15.7 seconds — ABNORMAL HIGH (ref 11.6–15.2)

## 2013-12-06 LAB — APTT

## 2013-12-06 LAB — CBC
HEMATOCRIT: 33.6 % — AB (ref 36.0–46.0)
Hemoglobin: 10.8 g/dL — ABNORMAL LOW (ref 12.0–15.0)
MCH: 29.3 pg (ref 26.0–34.0)
MCHC: 32.1 g/dL (ref 30.0–36.0)
MCV: 91.3 fL (ref 78.0–100.0)
Platelets: 378 10*3/uL (ref 150–400)
RBC: 3.68 MIL/uL — ABNORMAL LOW (ref 3.87–5.11)
RDW: 13.6 % (ref 11.5–15.5)
WBC: 15.2 10*3/uL — ABNORMAL HIGH (ref 4.0–10.5)

## 2013-12-06 LAB — HEPATITIS PANEL, ACUTE
HCV Ab: NEGATIVE
Hep A IgM: NONREACTIVE
Hep B C IgM: NONREACTIVE
Hepatitis B Surface Ag: NEGATIVE

## 2013-12-06 LAB — MRSA PCR SCREENING: MRSA by PCR: NEGATIVE

## 2013-12-06 LAB — HEMOGLOBIN A1C
Hgb A1c MFr Bld: 17 % — ABNORMAL HIGH (ref <5.7)
MEAN PLASMA GLUCOSE: 441 mg/dL — AB (ref <117)

## 2013-12-06 LAB — TSH: TSH: 1.23 u[IU]/mL (ref 0.350–4.500)

## 2013-12-06 LAB — LIPASE, BLOOD
LIPASE: 1591 U/L — AB (ref 11–59)
Lipase: 1192 U/L — ABNORMAL HIGH (ref 11–59)

## 2013-12-06 MED ORDER — DEXTROSE 5 % IV SOLN
INTRAVENOUS | Status: DC
  Administered 2013-12-06 (×2): via INTRAVENOUS

## 2013-12-06 MED ORDER — STERILE WATER FOR INJECTION IV SOLN
INTRAVENOUS | Status: DC
  Administered 2013-12-06 – 2013-12-07 (×2): via INTRAVENOUS
  Filled 2013-12-06 (×7): qty 850

## 2013-12-06 MED ORDER — PROMETHAZINE HCL 25 MG/ML IJ SOLN
12.5000 mg | Freq: Once | INTRAMUSCULAR | Status: AC
  Administered 2013-12-06: 12.5 mg via INTRAVENOUS
  Filled 2013-12-06: qty 1

## 2013-12-06 NOTE — Progress Notes (Signed)
Utilization review completed.  

## 2013-12-06 NOTE — Progress Notes (Signed)
Pt transferred From ED on monitor with RN, pt on insulin gtt at this time. Pt is sleepy but arousable and follows commands. Pt VSS, Mother at bedside and updated. Will continue to monitor.

## 2013-12-06 NOTE — Progress Notes (Signed)
[facility] TEAM 1 - Stepdown/ICU TEAM Progress Note  [name] [id number] DOB: 04/30/1991 DOA: 12/05/2013 PCP: No primary [name] on file.  Admit HPI / Brief Narrative: 23 yo BF PMHx Diabetes Type 2(has an endocrinologist at [facility]), noncompliance Presented to [facility] ED 12/05/2013 with reports of nausea and vomiting started about 1 day prior to this admission. Her CBG at home was in 500 range when she checked it. She reported she used her insulin as prescribed but her CBG's still read high. She has very minimal PO intake and was severely nauseas. No chest pain, no shortness of breath or palpitations. No reprots of blood in stool or urine. No fevers or chills. NO GU complaints.  In ED, vitals are as follows: BP 114/71, HR 117; RR 18; T max 97.5 F and oxygen saturation 100% on room air. Blood work revealed glucose of 861, AG of 42, CBG > 600, venous blood gas with pH of 7.13 and bicarbonate 5.9; on BMP she was further found to have CO2 of less than 7, creatinine 1.4, calcium 10.7, AST 38, ALT 37, ALP 158, lipase 1455. UA showed glucose >1,000, ketones > 80, negative leukocytes or nitrites.    HPI/Subjective: DKA (diabetic ketoacidoses)  - DKA order set in place; IV fluids (NS bolus given in ED), continue maintenance IV fluids with CGB's check Q 1 hour and BMP Q 2 hours until AG closes or bicarbonate improves  - regular insulin drip started in ED  - potassium not required since it is 5.1  - 6/18 hemoglobin A1c= 17 - 6/18 Bicarb in Sterile water 170ml/hr   Leukocytosis  - Obtain UA, blood culture -will monitor   Acute renal failure  - secondary to prerenal etiology, dehydration  - give IV fluids  - follow up renal function test in am   Hypercalcemia  - Resolved   Abnormal LFTs  - unclear etiology; obtain abdominal US for further evaluation  - Acute Hepatitis panel (negative) -UDS/alcohol level negative - trend LFT's   Elevated lipase  - lipase on admission 1455; 6/18  lipase = 1192  -Continue hydration, monitor for correction. -Would not obtain CT with contrast daily secondary to acute renal failure. If still concerned for chronic vs acute pancreatitis were obtained as outpatient after allowing patient's kidneys to fully recover.    Hypernatremia - D5W 13ml/hr   Assessment/Plan:    Code Status: FULL Family Communication: no family present at time of exam Disposition Plan: Resolution of DKA   Consultants: NA    Procedure/Significant Events: 6/17 Ultrasound abdomen complete; Hepatomegaly with echogenic liver compared to the right kidney compatible with hepatosteatosis. No mass lesion or intrahepatic biliary ductal dilation.  -Negative for cholelithiasis or cholecystitis.    Culture 6/18 urine pending 6/18 blood pending  Antibiotics: NA  DVT prophylaxis: SCD  Devices NA   LINES / TUBES:  6/17 8ga right antecubital 6/17 22ga right hand    Continuous Infusions: . sodium chloride 100 mL/hr at 12/05/13 2303  . dextrose 5 % and 0.45% NaCl 100 mL/hr at 12/06/13 0520  . insulin (NOVOLIN-R) infusion 2.9 Units/hr (12/06/13 1049)    Objective: VITAL SIGNS: Temp: 98.8 F (37.1 C) (06/18 0744) Temp src: Oral (06/18 0744) BP: 122/83 mmHg (06/18 0744) Pulse Rate: 117 (06/18 0744) SPO2; FIO2:   Intake/Output Summary (Last 24 hours) at 12/06/13 1133 Last data filed at 12/06/13 0451  Gross per 24 hour  Intake    500 ml  Output   1050 ml  Net   -  550 ml     Exam: General: A./O. x4, NAD, No acute respiratory distress Lungs: Clear to auscultation bilaterally without wheezes or crackles Cardiovascular: Regular rate and rhythm without murmur gallop or rub normal S1 and S2 Abdomen: Diffuse tenderness to palpation greatest in the epigastric region, , nondistended, soft, bowel sounds positive, no rebound, no ascites, no appreciable mass Extremities: No significant cyanosis, clubbing, or edema bilateral lower extremities  Data  Reviewed: Basic Metabolic Panel:  Recent Labs Lab 12/05/13 2312 12/06/13 0100 12/06/13 0230 12/06/13 0640 12/06/13 0840  NA 141 148* 152*  154* 153* 151*  K 5.4* 4.4 4.5  4.5 3.7 3.8  CL 100 105 110  111 115* 115*  CO2 <7* <7* <7*  <7* 14* 15*  GLUCOSE 790* 610* 452*  446* 238* 197*  BUN 17 18 18  18 17 16   CREATININE 1.13* 1.29* 1.39*  1.36* 1.46* 1.44*  CALCIUM 8.9 9.4 9.4  9.4 9.6 9.4   Liver Function Tests:  Recent Labs Lab 12/05/13 1937 12/06/13 0230  AST 38* 30  ALT 37* 33  ALKPHOS 158* 136*  BILITOT <0.2* <0.2*  PROT 8.8* 7.6  ALBUMIN 4.2 3.6    Recent Labs Lab 12/05/13 1937 12/06/13 0230  LIPASE 1455* 1591*   No results found for this basename: AMMONIA,  in the last 168 hours CBC:  Recent Labs Lab 12/05/13 1937 12/05/13 2110 12/06/13 0100 12/06/13 0230  WBC 11.9* 12.2* 16.3* 15.2*  NEUTROABS 9.9*  --  13.5*  --   HGB 11.9* 10.3* 10.8* 10.8*  HCT 36.4 31.8* 34.2* 33.6*  MCV 91.0 91.4 92.2 91.3  PLT 431* 355 391 378   Cardiac Enzymes: No results found for this basename: CKTOTAL, CKMB, CKMBINDEX, TROPONINI,  in the last 168 hours BNP (last 3 results) No results found for this basename: PROBNP,  in the last 8760 hours CBG:  Recent Labs Lab 12/06/13 0624 12/06/13 0736 12/06/13 0841 12/06/13 0945 12/06/13 1048  GLUCAP 207* 181* 149* 170* 133*    Recent Results (from the past 240 hour(s))  MRSA PCR SCREENING     Status: None   Collection Time    12/05/13 11:55 PM      Result Value Ref Range Status   MRSA by PCR NEGATIVE  NEGATIVE Final   Comment:            The GeneXpert MRSA Assay (FDA     approved for NASAL specimens     only), is one component of a     comprehensive MRSA colonization     surveillance program. It is not     intended to diagnose MRSA     infection nor to guide or     monitor treatment for     MRSA infections.     Studies:  Recent x-ray studies have been reviewed in detail by the Attending  Physician  Scheduled Meds:  Scheduled Meds: . sodium chloride  3 mL Intravenous Q12H    Time spent on care of this patient: 40 mins   Allie Bossier , MD   [facility] Office  [phone] Pager - [phone]  On-Call/Text Page:      [email or web address]      password [email or web address]  If 7PM-7AM, please contact night-coverage [email or web address] Password [facility] 12/06/2013, 11:33 AM   LOS: 1 day

## 2013-12-06 NOTE — Progress Notes (Signed)
Inpatient Diabetes Program Recommendations  AACE/ADA: New Consensus Statement on Inpatient Glycemic Control (2013)  Target Ranges:  Prepandial:   less than 140 mg/dL      Peak postprandial:   less than 180 mg/dL (1-2 hours)      Critically ill patients:  140 - 180 mg/dL     Results for RAEAH, STOUDER (MRN ID:[id number]) as of 12/06/2013 06:56  Ref. Range 12/05/2013 19:37  Sodium Latest Range: 137-147 mEq/L 142  Potassium Latest Range: 3.7-5.3 mEq/L 5.1  Chloride Latest Range: 96-112 mEq/L 93 (L)  CO2 Latest Range: 19-32 mEq/L <7 (LL)  BUN Latest Range: 6-23 mg/dL 17  Creatinine Latest Range: 0.50-1.10 mg/dL 1.40 (H)  Calcium Latest Range: 8.4-10.5 mg/dL 10.7 (H)  GFR calc non Af Amer Latest Range: >90 mL/min 53 (L)  GFR calc Af Amer Latest Range: >90 mL/min 61 (L)  Glucose Latest Range: 70-99 mg/dL 861 (HH)    Admitted with DKA.  Glucose 861 mg/dl, CO2 <7.  Home DM Meds:  70/30 insulin- 44 units AM/ 22 units PM + Novolog tid per SSI  Endocrinologist at [facility]- A1c pending   **Patient given IVF boluses in ED and started on IV insulin drip.  IVF changed to D5 1/2 NS at 100 cc/hour at 5am per DKA orders.  CO2 still less than 7 at 2:30 am today.    Will follow Wyn Quaker RN, MSN, CDE Diabetes Coordinator Inpatient Diabetes Program Team Pager: [phone] (8a-10p)

## 2013-12-07 DIAGNOSIS — N179 Acute kidney failure, unspecified: Secondary | ICD-10-CM | POA: Diagnosis not present

## 2013-12-07 DIAGNOSIS — R7989 Other specified abnormal findings of blood chemistry: Secondary | ICD-10-CM | POA: Diagnosis not present

## 2013-12-07 DIAGNOSIS — E872 Acidosis, unspecified: Secondary | ICD-10-CM | POA: Diagnosis not present

## 2013-12-07 DIAGNOSIS — E860 Dehydration: Secondary | ICD-10-CM | POA: Diagnosis not present

## 2013-12-07 DIAGNOSIS — E876 Hypokalemia: Secondary | ICD-10-CM | POA: Diagnosis present

## 2013-12-07 LAB — COMPREHENSIVE METABOLIC PANEL
ALT: 20 U/L (ref 0–35)
AST: 23 U/L (ref 0–37)
Albumin: 2.9 g/dL — ABNORMAL LOW (ref 3.5–5.2)
Alkaline Phosphatase: 98 U/L (ref 39–117)
BUN: 11 mg/dL (ref 6–23)
CALCIUM: 8.9 mg/dL (ref 8.4–10.5)
CO2: 23 mEq/L (ref 19–32)
CREATININE: 1.46 mg/dL — AB (ref 0.50–1.10)
Chloride: 100 mEq/L (ref 96–112)
GFR calc non Af Amer: 50 mL/min — ABNORMAL LOW (ref >90)
GFR, EST AFRICAN AMERICAN: 58 mL/min — AB (ref >90)
GLUCOSE: 145 mg/dL — AB (ref 70–99)
Potassium: 3.1 mEq/L — ABNORMAL LOW (ref 3.7–5.3)
SODIUM: 139 meq/L (ref 137–147)
TOTAL PROTEIN: 6.2 g/dL (ref 6.0–8.3)
Total Bilirubin: 0.3 mg/dL (ref 0.3–1.2)

## 2013-12-07 LAB — BASIC METABOLIC PANEL
BUN: 10 mg/dL (ref 6–23)
BUN: 10 mg/dL (ref 6–23)
BUN: 11 mg/dL (ref 6–23)
BUN: 12 mg/dL (ref 6–23)
BUN: 13 mg/dL (ref 6–23)
BUN: 7 mg/dL (ref 6–23)
BUN: 8 mg/dL (ref 6–23)
BUN: 8 mg/dL (ref 6–23)
BUN: 8 mg/dL (ref 6–23)
BUN: 9 mg/dL (ref 6–23)
BUN: 9 mg/dL (ref 6–23)
BUN: 9 mg/dL (ref 6–23)
CALCIUM: 8.6 mg/dL (ref 8.4–10.5)
CALCIUM: 9.1 mg/dL (ref 8.4–10.5)
CALCIUM: 8.6 mg/dL (ref 8.4–10.5)
CALCIUM: 8.8 mg/dL (ref 8.4–10.5)
CHLORIDE: 102 meq/L (ref 96–112)
CHLORIDE: 103 meq/L (ref 96–112)
CHLORIDE: 101 meq/L (ref 96–112)
CHLORIDE: 99 meq/L (ref 96–112)
CHLORIDE: 96 meq/L (ref 96–112)
CHLORIDE: 98 meq/L (ref 96–112)
CHLORIDE: 99 meq/L (ref 96–112)
CO2: 21 mEq/L (ref 19–32)
CO2: 23 mEq/L (ref 19–32)
CO2: 23 mEq/L (ref 19–32)
CO2: 26 mEq/L (ref 19–32)
CO2: 26 mEq/L (ref 19–32)
CO2: 27 mEq/L (ref 19–32)
CO2: 29 mEq/L (ref 19–32)
CO2: 29 meq/L (ref 19–32)
CO2: 29 meq/L (ref 19–32)
CO2: 31 mEq/L (ref 19–32)
CO2: 31 mEq/L (ref 19–32)
CO2: 31 meq/L (ref 19–32)
CREATININE: 1.47 mg/dL — AB (ref 0.50–1.10)
CREATININE: 1.21 mg/dL — AB (ref 0.50–1.10)
CREATININE: 1.2 mg/dL — AB (ref 0.50–1.10)
Calcium: 8.8 mg/dL (ref 8.4–10.5)
Calcium: 8.8 mg/dL (ref 8.4–10.5)
Calcium: 8.9 mg/dL (ref 8.4–10.5)
Calcium: 8.9 mg/dL (ref 8.4–10.5)
Calcium: 8.9 mg/dL (ref 8.4–10.5)
Calcium: 8.9 mg/dL (ref 8.4–10.5)
Calcium: 9 mg/dL (ref 8.4–10.5)
Calcium: 9.2 mg/dL (ref 8.4–10.5)
Chloride: 101 mEq/L (ref 96–112)
Chloride: 103 mEq/L (ref 96–112)
Chloride: 97 mEq/L (ref 96–112)
Chloride: 98 mEq/L (ref 96–112)
Chloride: 99 mEq/L (ref 96–112)
Creatinine, Ser: 1.09 mg/dL (ref 0.50–1.10)
Creatinine, Ser: 1.22 mg/dL — ABNORMAL HIGH (ref 0.50–1.10)
Creatinine, Ser: 1.24 mg/dL — ABNORMAL HIGH (ref 0.50–1.10)
Creatinine, Ser: 1.24 mg/dL — ABNORMAL HIGH (ref 0.50–1.10)
Creatinine, Ser: 1.3 mg/dL — ABNORMAL HIGH (ref 0.50–1.10)
Creatinine, Ser: 1.38 mg/dL — ABNORMAL HIGH (ref 0.50–1.10)
Creatinine, Ser: 1.44 mg/dL — ABNORMAL HIGH (ref 0.50–1.10)
Creatinine, Ser: 1.58 mg/dL — ABNORMAL HIGH (ref 0.50–1.10)
Creatinine, Ser: 1.58 mg/dL — ABNORMAL HIGH (ref 0.50–1.10)
GFR calc Af Amer: 53 mL/min — ABNORMAL LOW (ref >90)
GFR calc Af Amer: 59 mL/min — ABNORMAL LOW (ref >90)
GFR calc Af Amer: 58 mL/min — ABNORMAL LOW (ref >90)
GFR calc Af Amer: 62 mL/min — ABNORMAL LOW (ref >90)
GFR calc Af Amer: 71 mL/min — ABNORMAL LOW (ref >90)
GFR calc Af Amer: 83 mL/min — ABNORMAL LOW (ref >90)
GFR calc Af Amer: 74 mL/min — ABNORMAL LOW (ref >90)
GFR calc non Af Amer: 46 mL/min — ABNORMAL LOW (ref >90)
GFR calc non Af Amer: 51 mL/min — ABNORMAL LOW (ref >90)
GFR calc non Af Amer: 50 mL/min — ABNORMAL LOW (ref >90)
GFR calc non Af Amer: 62 mL/min — ABNORMAL LOW (ref >90)
GFR calc non Af Amer: 61 mL/min — ABNORMAL LOW (ref >90)
GFR calc non Af Amer: 63 mL/min — ABNORMAL LOW (ref >90)
GFR, EST AFRICAN AMERICAN: 53 mL/min — AB (ref >90)
GFR, EST AFRICAN AMERICAN: 67 mL/min — AB (ref >90)
GFR, EST AFRICAN AMERICAN: 71 mL/min — AB (ref >90)
GFR, EST AFRICAN AMERICAN: 72 mL/min — AB (ref >90)
GFR, EST AFRICAN AMERICAN: 73 mL/min — AB (ref >90)
GFR, EST NON AFRICAN AMERICAN: 46 mL/min — AB (ref >90)
GFR, EST NON AFRICAN AMERICAN: 54 mL/min — AB (ref >90)
GFR, EST NON AFRICAN AMERICAN: 58 mL/min — AB (ref >90)
GFR, EST NON AFRICAN AMERICAN: 61 mL/min — AB (ref >90)
GFR, EST NON AFRICAN AMERICAN: 64 mL/min — AB (ref >90)
GFR, EST NON AFRICAN AMERICAN: 71 mL/min — AB (ref >90)
GLUCOSE: 172 mg/dL — AB (ref 70–99)
GLUCOSE: 155 mg/dL — AB (ref 70–99)
Glucose, Bld: 121 mg/dL — ABNORMAL HIGH (ref 70–99)
Glucose, Bld: 125 mg/dL — ABNORMAL HIGH (ref 70–99)
Glucose, Bld: 136 mg/dL — ABNORMAL HIGH (ref 70–99)
Glucose, Bld: 148 mg/dL — ABNORMAL HIGH (ref 70–99)
Glucose, Bld: 158 mg/dL — ABNORMAL HIGH (ref 70–99)
Glucose, Bld: 159 mg/dL — ABNORMAL HIGH (ref 70–99)
Glucose, Bld: 160 mg/dL — ABNORMAL HIGH (ref 70–99)
Glucose, Bld: 195 mg/dL — ABNORMAL HIGH (ref 70–99)
Glucose, Bld: 205 mg/dL — ABNORMAL HIGH (ref 70–99)
Glucose, Bld: 241 mg/dL — ABNORMAL HIGH (ref 70–99)
POTASSIUM: 2.7 meq/L — AB (ref 3.7–5.3)
POTASSIUM: 2.8 meq/L — AB (ref 3.7–5.3)
POTASSIUM: 2.8 meq/L — AB (ref 3.7–5.3)
POTASSIUM: 2.9 meq/L — AB (ref 3.7–5.3)
POTASSIUM: 3.1 meq/L — AB (ref 3.7–5.3)
POTASSIUM: 3.2 meq/L — AB (ref 3.7–5.3)
POTASSIUM: 3.2 meq/L — AB (ref 3.7–5.3)
POTASSIUM: 3.3 meq/L — AB (ref 3.7–5.3)
Potassium: 2.7 mEq/L — CL (ref 3.7–5.3)
Potassium: 3 mEq/L — ABNORMAL LOW (ref 3.7–5.3)
Potassium: 3.1 mEq/L — ABNORMAL LOW (ref 3.7–5.3)
Potassium: 3.4 mEq/L — ABNORMAL LOW (ref 3.7–5.3)
SODIUM: 139 meq/L (ref 137–147)
SODIUM: 141 meq/L (ref 137–147)
SODIUM: 141 meq/L (ref 137–147)
SODIUM: 142 meq/L (ref 137–147)
SODIUM: 142 meq/L (ref 137–147)
SODIUM: 142 meq/L (ref 137–147)
SODIUM: 142 meq/L (ref 137–147)
SODIUM: 143 meq/L (ref 137–147)
Sodium: 140 mEq/L (ref 137–147)
Sodium: 141 mEq/L (ref 137–147)
Sodium: 141 mEq/L (ref 137–147)
Sodium: 142 mEq/L (ref 137–147)

## 2013-12-07 LAB — GLUCOSE, CAPILLARY
GLUCOSE-CAPILLARY: 137 mg/dL — AB (ref 70–99)
GLUCOSE-CAPILLARY: 141 mg/dL — AB (ref 70–99)
GLUCOSE-CAPILLARY: 150 mg/dL — AB (ref 70–99)
GLUCOSE-CAPILLARY: 159 mg/dL — AB (ref 70–99)
GLUCOSE-CAPILLARY: 165 mg/dL — AB (ref 70–99)
GLUCOSE-CAPILLARY: 177 mg/dL — AB (ref 70–99)
GLUCOSE-CAPILLARY: 177 mg/dL — AB (ref 70–99)
GLUCOSE-CAPILLARY: 209 mg/dL — AB (ref 70–99)
Glucose-Capillary: 130 mg/dL — ABNORMAL HIGH (ref 70–99)
Glucose-Capillary: 164 mg/dL — ABNORMAL HIGH (ref 70–99)
Glucose-Capillary: 166 mg/dL — ABNORMAL HIGH (ref 70–99)
Glucose-Capillary: 172 mg/dL — ABNORMAL HIGH (ref 70–99)
Glucose-Capillary: 218 mg/dL — ABNORMAL HIGH (ref 70–99)
Glucose-Capillary: 156 mg/dL — ABNORMAL HIGH (ref 70–99)
Glucose-Capillary: 115 mg/dL — ABNORMAL HIGH (ref 70–99)
Glucose-Capillary: 118 mg/dL — ABNORMAL HIGH (ref 70–99)
Glucose-Capillary: 198 mg/dL — ABNORMAL HIGH (ref 70–99)
Glucose-Capillary: 194 mg/dL — ABNORMAL HIGH (ref 70–99)
Glucose-Capillary: 123 mg/dL — ABNORMAL HIGH (ref 70–99)
Glucose-Capillary: 169 mg/dL — ABNORMAL HIGH (ref 70–99)

## 2013-12-07 LAB — URINE CULTURE
Colony Count: NO GROWTH
Culture: NO GROWTH

## 2013-12-07 LAB — MAGNESIUM: Magnesium: 1.7 mg/dL (ref 1.5–2.5)

## 2013-12-07 LAB — LIPASE, BLOOD: LIPASE: 894 U/L — AB (ref 11–59)

## 2013-12-07 MED ORDER — DEXTROSE-NACL 5-0.45 % IV SOLN
INTRAVENOUS | Status: DC
  Administered 2013-12-07: 19:00:00 via INTRAVENOUS

## 2013-12-07 MED ORDER — POTASSIUM CHLORIDE CRYS ER 20 MEQ PO TBCR
40.0000 meq | EXTENDED_RELEASE_TABLET | Freq: Two times a day (BID) | ORAL | Status: AC
  Administered 2013-12-07 (×2): 40 meq via ORAL
  Filled 2013-12-07 (×2): qty 2

## 2013-12-07 MED ORDER — POTASSIUM CHLORIDE 10 MEQ/100ML IV SOLN
10.0000 meq | INTRAVENOUS | Status: AC
  Administered 2013-12-07 (×5): 10 meq via INTRAVENOUS
  Filled 2013-12-07 (×5): qty 100

## 2013-12-07 MED ORDER — INSULIN GLARGINE 100 UNIT/ML ~~LOC~~ SOLN
26.0000 [IU] | Freq: Every day | SUBCUTANEOUS | Status: DC
  Administered 2013-12-07 – 2013-12-08 (×2): 26 [IU] via SUBCUTANEOUS
  Filled 2013-12-07 (×4): qty 0.26

## 2013-12-07 NOTE — Progress Notes (Signed)
[place] TEAM 1 - Stepdown/ICU TEAM Progress Note  [name] [id number] DOB: [birth year] DOA: 12/05/2013 PCP: No primary [name] on file.  Admit HPI / Brief Narrative: 23 yo BF PMHx Diabetes Type 2(has an endocrinologist at [facility]), noncompliance Presented to [facility] ED 12/05/2013 with reports of nausea and vomiting started about 1 day prior to this admission. Her CBG at home was in 500 range when she checked it. She reported she used her insulin as prescribed but her CBG's still read high. She has very minimal PO intake and was severely nauseas. No chest pain, no shortness of breath or palpitations. No reprots of blood in stool or urine. No fevers or chills. NO GU complaints.  In ED, vitals are as follows: BP 114/71, HR 117; RR 18; T max 97.5 F and oxygen saturation 100% on room air. Blood work revealed glucose of 861, AG of 42, CBG > 600, venous blood gas with pH of 7.13 and bicarbonate 5.9; on BMP she was further found to have CO2 of less than 7, creatinine 1.4, calcium 10.7, AST 38, ALT 37, ALP 158, lipase 1455. UA showed glucose >1,000, ketones > 80, negative leukocytes or nitrites.    HPI/Subjective: 6/19 patient sitting in chair much more comfortable and awake today. Freely admits that she does not stay on a diabetic diet, nor check her CBGs as a structured.    Assessment/Plan: DKA (diabetic ketoacidoses)  - DKA order set in place; IV fluids (NS bolus given in ED), continue maintenance IV fluids with CGB's check Q 1 hour and BMP Q 2 hours until AG closes or bicarbonate improves  - 6/18 hemoglobin A1c= 17 - 6/19 DC Bicarb in Sterile water 132ml/hr   Hypokalemia -K-Dur 40 mEq x2 doses, recheck in Am  Leukocytosis  - UA, blood culture, pending  -will monitor   Acute renal failure  - secondary to prerenal etiology, dehydration  - Continue  IV fluids  - Cr improving but still elevated    Hypercalcemia  - Resolved   Abnormal LFTs  - unclear etiology; abdominal US  nondiagnostic, improving with resolution of DKA, continue to monitor  - Acute Hepatitis panel (negative) -UDS/alcohol level negative - trend LFT's   Elevated lipase  - lipase on admission 1455; 6/18 lipase = 1192, 6/19 lipase = 894 -Continue hydration, monitor for correction. -Would not obtain CT with contrast daily secondary to acute renal failure. If still concerned for chronic vs acute pancreatitis were obtained as outpatient after allowing patient's kidneys to fully recover.    Hypernatremia -switch to D5W  1/2 NS 155ml/hr    Code Status: FULL Family Communication: no family present at time of exam Disposition Plan: Resolution of DKA   Consultants: NA    Procedure/Significant Events: 6/17 Ultrasound abdomen complete; Hepatomegaly with echogenic liver compared to the right kidney compatible with hepatosteatosis. No mass lesion or intrahepatic biliary ductal dilation.  -Negative for cholelithiasis or cholecystitis.    Culture 6/18 urine NTD  6/18 blood pending  Antibiotics: NA  DVT prophylaxis: SCD  Devices NA   LINES / TUBES:  6/17 8ga right antecubital 6/17 22ga right hand    Continuous Infusions: . dextrose 100 mL/hr at 12/06/13 2224  . insulin (NOVOLIN-R) infusion 1.8 Units/hr (12/07/13 1808)    Objective: VITAL SIGNS: Temp: 98.7 F (37.1 C) (06/19 1403) Temp src: Oral (06/19 1403) BP: 120/87 mmHg (06/19 1403) Pulse Rate: 100 (06/19 1403) SPO2; FIO2:   Intake/Output Summary (Last 24 hours) at 12/07/13 1828 Last data  filed at 12/07/13 0600  Gross per 24 hour  Intake 2628.23 ml  Output   1300 ml  Net 1328.23 ml     Exam: General: A./O. x4, NAD, No acute respiratory distress Lungs: Clear to auscultation bilaterally without wheezes or crackles Cardiovascular: Regular rate and rhythm without murmur gallop or rub normal S1 and S2 Abdomen: Nontender to palpation, nondistended, soft, bowel sounds positive, no rebound, no ascites, no  appreciable mass Extremities: No significant cyanosis, clubbing, or edema bilateral lower extremities  Data Reviewed: Basic Metabolic Panel:  Recent Labs Lab 12/07/13 0927 12/07/13 1155 12/07/13 1300 12/07/13 1548 12/07/13 1625  NA 142 142 141 142 141  K 2.9* 3.2* 2.8* 2.7* 3.0*  CL 101 99 98 98 96  CO2 26 29 27 31 29   GLUCOSE 158* 160* 205* 125* 121*  BUN 10 9 9 8 9   CREATININE 1.38* 1.30* 1.22* 1.24* 1.21*  CALCIUM 8.9 9.0 8.6 8.9 9.1  MG  --   --   --   --  1.7   Liver Function Tests:  Recent Labs Lab 12/05/13 1937 12/06/13 0230 12/07/13 0411  AST 38* 30 23  ALT 37* 33 20  ALKPHOS 158* 136* 98  BILITOT <0.2* <0.2* 0.3  PROT 8.8* 7.6 6.2  ALBUMIN 4.2 3.6 2.9*    Recent Labs Lab 12/05/13 1937 12/06/13 0230 12/06/13 1325 12/07/13 0411  LIPASE 1455* 1591* 1192* 894*   No results found for this basename: AMMONIA,  in the last 168 hours CBC:  Recent Labs Lab 12/05/13 1937 12/05/13 2110 12/06/13 0100 12/06/13 0230  WBC 11.9* 12.2* 16.3* 15.2*  NEUTROABS 9.9*  --  13.5*  --   HGB 11.9* 10.3* 10.8* 10.8*  HCT 36.4 31.8* 34.2* 33.6*  MCV 91.0 91.4 92.2 91.3  PLT 431* 355 391 378   Cardiac Enzymes: No results found for this basename: CKTOTAL, CKMB, CKMBINDEX, TROPONINI,  in the last 168 hours BNP (last 3 results) No results found for this basename: PROBNP,  in the last 8760 hours CBG:  Recent Labs Lab 12/07/13 1132 12/07/13 1231 12/07/13 1401 12/07/13 1526 12/07/13 1629  GLUCAP 118* 198* 194* 141* 123*    Recent Results (from the past 240 hour(s))  MRSA PCR SCREENING     Status: None   Collection Time    12/05/13 11:55 PM      Result Value Ref Range Status   MRSA by PCR NEGATIVE  NEGATIVE Final   Comment:            The GeneXpert MRSA Assay (FDA     approved for NASAL specimens     only), is one component of a     comprehensive MRSA colonization     surveillance program. It is not     intended to diagnose MRSA     infection nor to  guide or     monitor treatment for     MRSA infections.  URINE CULTURE     Status: None   Collection Time    12/06/13  4:52 AM      Result Value Ref Range Status   Specimen Description URINE, RANDOM   Final   Special Requests NONE   Final   Culture  Setup Time     Final   Value: 12/06/2013 05:00     Performed at [place]     Final   Value: NO GROWTH     Performed at [facility]  Final   Value: NO GROWTH     Performed at [facility]   Report Status 12/07/2013 FINAL   Final     Studies:  Recent x-ray studies have been reviewed in detail by the Attending Physician  Scheduled Meds:  Scheduled Meds: . potassium chloride  40 mEq Oral BID  . sodium chloride  3 mL Intravenous Q12H    Time spent on care of this patient: 40 mins   Allie Bossier , MD   [facility] Office  [phone] Pager - [phone]  On-Call/Text Page:      [email or web address]      password [email or web address]  If 7PM-7AM, please contact night-coverage [email or web address] Password [email or web address] 12/07/2013, 6:28 PM   LOS: 2 days

## 2013-12-07 NOTE — Progress Notes (Signed)
Inpatient Diabetes Program Recommendations  AACE/ADA: New Consensus Statement on Inpatient Glycemic Control (2013)  Target Ranges:  Prepandial:   less than 140 mg/dL      Peak postprandial:   less than 180 mg/dL (1-2 hours)      Critically ill patients:  140 - 180 mg/dL     Results for [name], [name] (MRN [id number]) as of 12/07/2013 11:10  Ref. Range 12/07/2013 09:27  Sodium Latest Range: 137-147 mEq/L 142  Potassium Latest Range: 3.7-5.3 mEq/L 2.9 (LL)  Chloride Latest Range: 96-112 mEq/L 101  CO2 Latest Range: 19-32 mEq/L 26  BUN Latest Range: 6-23 mg/dL 10  Creatinine Latest Range: 0.50-1.10 mg/dL 1.38 (H)  Calcium Latest Range: 8.4-10.5 mg/dL 8.9  GFR calc non Af Amer Latest Range: >90 mL/min 54 (L)  GFR calc Af Amer Latest Range: >90 mL/min 62 (L)  Glucose Latest Range: 70-99 mg/dL 158 (H)    Admitted with DKA. Glucose 861 mg/dl, CO2 <7.   Home DM Meds: 70/30 insulin- 44 units AM/ 22 units PM + Novolog tid (patient to take 3 units Novolog for every 50 mg/dl for CBG above 200 mg/dl)  Endocrinologist at [facility].      **Spoke with patient and her mother this morning about patient's A1c of 17%.  Patient told me she was seeing an Endocrinologist at [facility] but has made an appointment at an Endocrinology practice here in [place] on July 8th (patient not able to recall the name of the Endocrinology practice here in [place]).  Patient is a Ship broker at [employer] and wanted to establish care here since is it too far for her to drive to [facility] for MD appointments.  Patient did not seem terribly surprised or concerned about her A1c.  She told me she is checking her CBGs bid usually at home and does not have a problem taking her 70/30 insulin.  I asked patient how she doses her Novolog at home.  Patient seemed confused about how to dose her Novolog and would not give me a definite answer.  Mom and patient told me that patient has been on Levemir insulin, Lantus insulin, and Novolog  70/30 insulin before and has also taken a combination of long-acting and 70/30 insulin before as well.  Patient told me she feels like a "[place] pig" when it comes to her insulin regimen.  She also went on to tell me that the doctor seeing her here in the hospital mentioned something about discharging her home on Lantus and Novolog.  **Reminded patient that her goal A1c is 7% or less per ADA standards to prevent both acute and long-term complications.  Encouraged patient to check her CBGs at least tid before all meals and to record all CBGs in a logbook for her PCP/Endo to review.  Asked patient if she has a lot of hypoglycemia at home.  Patient told me she has occasional hypoglycemia but knows what to do when she is low.  Patient stated she is not afraid of having lows b/c she knows how to treat.  Patient went on to tell me that her current A1c is the highest it has ever been.    MD- When patient ready to transition off the IV insulin drip, recommend one of the two below regimens:  1) Start patient back on her Home dose of 70/30 insulin + Novolog Sensitive SSI  OR  2) Start patient on basal/bolus regimen based on her weight with follow up with her new Endo in July (1.0  units/kg total daily dosing with 1/2 basal and 1/2 bolus)  Lantus 26 units daily- Give Lantus 1-2 hours before IV insulin drip stopped Novolog Sensitive SSI Novolog 6 units tid with meals (Meal Coverage)    Will follow Wyn Quaker RN, MSN, CDE Diabetes Coordinator Inpatient Diabetes Program Team Pager: [phone] (8a-10p)

## 2013-12-07 NOTE — Progress Notes (Signed)
CRITICAL VALUE ALERT  Critical value received:  K+=2.7   Date of notification:  12/07/2013  Time of notification:  0220  Critical value read back:yes  Nurse who received alert:  L Hitt RN  MD notified (1st page):  NP Schorr  Time of first page:  0220  MD notified (2nd page):  Time of second page:  Responding MD:  NP Schorr  Time MD responded:  [id number] NP placed new orders for IV potassium. Pt VSS at this time, will continue to monitor.

## 2013-12-08 DIAGNOSIS — IMO0002 Reserved for concepts with insufficient information to code with codable children: Secondary | ICD-10-CM | POA: Diagnosis present

## 2013-12-08 DIAGNOSIS — R7989 Other specified abnormal findings of blood chemistry: Secondary | ICD-10-CM | POA: Diagnosis not present

## 2013-12-08 DIAGNOSIS — E860 Dehydration: Secondary | ICD-10-CM | POA: Diagnosis not present

## 2013-12-08 DIAGNOSIS — N179 Acute kidney failure, unspecified: Secondary | ICD-10-CM | POA: Diagnosis not present

## 2013-12-08 DIAGNOSIS — E872 Acidosis, unspecified: Secondary | ICD-10-CM | POA: Diagnosis not present

## 2013-12-08 DIAGNOSIS — E1065 Type 1 diabetes mellitus with hyperglycemia: Secondary | ICD-10-CM

## 2013-12-08 DIAGNOSIS — E1029 Type 1 diabetes mellitus with other diabetic kidney complication: Secondary | ICD-10-CM | POA: Diagnosis present

## 2013-12-08 DIAGNOSIS — E1129 Type 2 diabetes mellitus with other diabetic kidney complication: Secondary | ICD-10-CM

## 2013-12-08 DIAGNOSIS — E1165 Type 2 diabetes mellitus with hyperglycemia: Secondary | ICD-10-CM

## 2013-12-08 LAB — COMPREHENSIVE METABOLIC PANEL
ALT: 18 U/L (ref 0–35)
AST: 23 U/L (ref 0–37)
Albumin: 2.7 g/dL — ABNORMAL LOW (ref 3.5–5.2)
Alkaline Phosphatase: 104 U/L (ref 39–117)
BILIRUBIN TOTAL: 0.5 mg/dL (ref 0.3–1.2)
BUN: 7 mg/dL (ref 6–23)
CHLORIDE: 103 meq/L (ref 96–112)
CO2: 30 mEq/L (ref 19–32)
Calcium: 9 mg/dL (ref 8.4–10.5)
Creatinine, Ser: 1.11 mg/dL — ABNORMAL HIGH (ref 0.50–1.10)
GFR calc non Af Amer: 70 mL/min — ABNORMAL LOW (ref >90)
GFR, EST AFRICAN AMERICAN: 81 mL/min — AB (ref >90)
GLUCOSE: 104 mg/dL — AB (ref 70–99)
POTASSIUM: 3.8 meq/L (ref 3.7–5.3)
Sodium: 144 mEq/L (ref 137–147)
Total Protein: 6.2 g/dL (ref 6.0–8.3)

## 2013-12-08 LAB — CBC WITH DIFFERENTIAL/PLATELET
Basophils Absolute: 0 10*3/uL (ref 0.0–0.1)
Basophils Relative: 0 % (ref 0–1)
EOS PCT: 1 % (ref 0–5)
Eosinophils Absolute: 0.1 10*3/uL (ref 0.0–0.7)
HEMATOCRIT: 27 % — AB (ref 36.0–46.0)
HEMOGLOBIN: 8.9 g/dL — AB (ref 12.0–15.0)
LYMPHS PCT: 24 % (ref 12–46)
Lymphs Abs: 1.3 10*3/uL (ref 0.7–4.0)
MCH: 29.4 pg (ref 26.0–34.0)
MCHC: 33 g/dL (ref 30.0–36.0)
MCV: 89.1 fL (ref 78.0–100.0)
MONO ABS: 0.5 10*3/uL (ref 0.1–1.0)
Monocytes Relative: 9 % (ref 3–12)
NEUTROS ABS: 3.4 10*3/uL (ref 1.7–7.7)
Neutrophils Relative %: 66 % (ref 43–77)
Platelets: 219 10*3/uL (ref 150–400)
RBC: 3.03 MIL/uL — ABNORMAL LOW (ref 3.87–5.11)
RDW: 13.4 % (ref 11.5–15.5)
WBC: 5.2 10*3/uL (ref 4.0–10.5)

## 2013-12-08 LAB — GLUCOSE, CAPILLARY
GLUCOSE-CAPILLARY: 150 mg/dL — AB (ref 70–99)
GLUCOSE-CAPILLARY: 155 mg/dL — AB (ref 70–99)
GLUCOSE-CAPILLARY: 128 mg/dL — AB (ref 70–99)
GLUCOSE-CAPILLARY: 306 mg/dL — AB (ref 70–99)
Glucose-Capillary: 114 mg/dL — ABNORMAL HIGH (ref 70–99)
Glucose-Capillary: 127 mg/dL — ABNORMAL HIGH (ref 70–99)
Glucose-Capillary: 162 mg/dL — ABNORMAL HIGH (ref 70–99)
Glucose-Capillary: 348 mg/dL — ABNORMAL HIGH (ref 70–99)

## 2013-12-08 LAB — BASIC METABOLIC PANEL
BUN: 7 mg/dL (ref 6–23)
BUN: 7 mg/dL (ref 6–23)
CHLORIDE: 102 meq/L (ref 96–112)
CHLORIDE: 100 meq/L (ref 96–112)
CO2: 30 mEq/L (ref 19–32)
CO2: 30 mEq/L (ref 19–32)
CREATININE: 1.1 mg/dL (ref 0.50–1.10)
Calcium: 8.8 mg/dL (ref 8.4–10.5)
Calcium: 9 mg/dL (ref 8.4–10.5)
Creatinine, Ser: 1.15 mg/dL — ABNORMAL HIGH (ref 0.50–1.10)
GFR calc Af Amer: 78 mL/min — ABNORMAL LOW (ref >90)
GFR calc non Af Amer: 71 mL/min — ABNORMAL LOW (ref >90)
GFR, EST AFRICAN AMERICAN: 82 mL/min — AB (ref >90)
GFR, EST NON AFRICAN AMERICAN: 67 mL/min — AB (ref >90)
GLUCOSE: 110 mg/dL — AB (ref 70–99)
Glucose, Bld: 104 mg/dL — ABNORMAL HIGH (ref 70–99)
POTASSIUM: 3.9 meq/L (ref 3.7–5.3)
Potassium: 3.5 mEq/L — ABNORMAL LOW (ref 3.7–5.3)
SODIUM: 143 meq/L (ref 137–147)
Sodium: 144 mEq/L (ref 137–147)

## 2013-12-08 LAB — LIPASE, BLOOD: LIPASE: 289 U/L — AB (ref 11–59)

## 2013-12-08 LAB — MAGNESIUM: Magnesium: 1.8 mg/dL (ref 1.5–2.5)

## 2013-12-08 MED ORDER — INSULIN ASPART 100 UNIT/ML ~~LOC~~ SOLN
0.0000 [IU] | SUBCUTANEOUS | Status: DC
  Administered 2013-12-08: 11 [IU] via SUBCUTANEOUS
  Administered 2013-12-09: 2 [IU] via SUBCUTANEOUS

## 2013-12-08 MED ORDER — INSULIN ASPART 100 UNIT/ML ~~LOC~~ SOLN: 0.0000 [IU] | Freq: Every day | SUBCUTANEOUS | Status: DC

## 2013-12-08 MED ORDER — INSULIN ASPART 100 UNIT/ML ~~LOC~~ SOLN
6.0000 [IU] | Freq: Three times a day (TID) | SUBCUTANEOUS | Status: DC
  Administered 2013-12-08 – 2013-12-09 (×4): 6 [IU] via SUBCUTANEOUS

## 2013-12-08 MED ORDER — SODIUM CHLORIDE 0.9 % IV SOLN
INTRAVENOUS | Status: DC
  Administered 2013-12-08: 02:00:00 via INTRAVENOUS

## 2013-12-08 MED ORDER — INSULIN ASPART 100 UNIT/ML ~~LOC~~ SOLN
0.0000 [IU] | Freq: Three times a day (TID) | SUBCUTANEOUS | Status: DC
  Administered 2013-12-08: 2 [IU] via SUBCUTANEOUS

## 2013-12-08 NOTE — Progress Notes (Signed)
Pt transferred from 3S into 5W22.  Oriented patient to room.  Pt denied any needs at this time.

## 2013-12-08 NOTE — Progress Notes (Signed)
Patient's dinner tray was late this evening- 6 units of insulin was given at 2012. Patient now has 11 units of short acting and 26 units of lantus due. Np, Rogue Bussing was notified about giving insulin so close together. Will continue to monitor

## 2013-12-08 NOTE — Progress Notes (Addendum)
[place] TEAM 1 - Stepdown/ICU TEAM Progress Note  KIKO FICO [id number] DOB: 08-17-1990 DOA: 12/05/2013 PCP: No primary Andreea Arca on file.  Admit HPI / Brief Narrative: 23 yo BF PMHx Diabetes Type 2(has an endocrinologist at [facility]), noncompliance Presented to [facility] ED 12/05/2013 with reports of nausea and vomiting started about 1 day prior to this admission. Her CBG at home was in 500 range when she checked it. She reported she used her insulin as prescribed but her CBG's still read high. She has very minimal PO intake and was severely nauseas. No chest pain, no shortness of breath or palpitations. No reprots of blood in stool or urine. No fevers or chills. NO GU complaints.  In ED, vitals are as follows: BP 114/71, HR 117; RR 18; T max 97.5 F and oxygen saturation 100% on room air. Blood work revealed glucose of 861, AG of 42, CBG > 600, venous blood gas with pH of 7.13 and bicarbonate 5.9; on BMP she was further found to have CO2 of less than 7, creatinine 1.4, calcium 10.7, AST 38, ALT 37, ALP 158, lipase 1455. UA showed glucose >1,000, ketones > 80, negative leukocytes or nitrites.    HPI/Subjective: 6/20 lying in bed stating she feels significantly improved since admission. States negative N./V. postprandial.    Assessment/Plan: DKA (diabetic ketoacidoses)  - DKA order set in place; IV fluids (NS bolus given in ED), continue maintenance IV fluids with CGB's check Q 1 hour and BMP Q 2 hours until AG closes or bicarbonate improves  - 6/18 hemoglobin A1c= 17 - 6/19 DC Bicarb in Sterile water 162ml/hr -6/20 resolved by: Current AG= 11  Uncontrolled diabetes with renal manifestation -Continue Lantus 28 units daily -Continue NovoLog 6 units QAC - Moderate SSI  Hypokalemia -Resolved, continue to follow  Leukocytosis  - Resolved, UA, blood culture NTD -will monitor   Acute renal failure  - secondary to prerenal etiology, dehydration  - Resolved, Cr still mildly elevated  but significantly improved    Hypercalcemia  - Resolved   Abnormal LFTs  - unclear etiology; abdominal US nondiagnostic, improving with resolution of DKA, continue to monitor  - Acute Hepatitis panel (negative) -UDS/alcohol level negative - 6/20 resolved    Elevated lipase  - lipase on admission 1455; 6/18 lipase = 1192, 6/19 lipase = 894, 6/20 lipase = 289 -monitor for correction. -Would not obtain CT with contrast daily secondary to acute renal failure. If still concerned for chronic vs acute pancreatitis were obtained as outpatient after allowing patient's kidneys to fully recover.    Hypernatremia -resolved     Code Status: FULL Family Communication: no family present at time of exam Disposition Plan: Resolution of DKA   Consultants: NA    Procedure/Significant Events: 6/17 Ultrasound abdomen complete; Hepatomegaly with echogenic liver compared to the right kidney compatible with hepatosteatosis. No mass lesion or intrahepatic biliary ductal dilation.  -Negative for cholelithiasis or cholecystitis.    Culture 6/17 MRSA by PCR negative 6/18 urine NTD (final) 6/18 blood left arm/left hand NTD  Antibiotics: NA  DVT prophylaxis: SCD  Devices NA   LINES / TUBES:  6/17 8ga right antecubital 6/17 22ga right hand    Continuous Infusions: . sodium chloride 75 mL/hr at 12/08/13 0151    Objective: VITAL SIGNS: Temp: 99.1 F (37.3 C) (06/20 1640) Temp src: Oral (06/20 1640) BP: 111/68 mmHg (06/20 1640) Pulse Rate: 117 (06/20 1640) SPO2; FIO2:   Intake/Output Summary (Last 24 hours) at 12/08/13 1703 Last  data filed at 12/08/13 1340  Gross per 24 hour  Intake   3100 ml  Output   1400 ml  Net   1700 ml     Exam: General: A./O. x4, NAD, No acute respiratory distress Lungs: Clear to auscultation bilaterally without wheezes or crackles Cardiovascular: Regular rate and rhythm without murmur gallop or rub normal S1 and S2 Abdomen: Nontender to  palpation, nondistended, soft, bowel sounds positive, no rebound, no ascites, no appreciable mass Extremities: No significant cyanosis, clubbing, or edema bilateral lower extremities  Data Reviewed: Basic Metabolic Panel:  Recent Labs Lab 12/07/13 1548 12/07/13 1625  12/07/13 2108 12/07/13 2255 12/08/13 0056 12/08/13 0501 12/08/13 0502  NA 142 141  < > 142 143 143 144 144  K 2.7* 3.0*  < > 3.1* 3.4* 3.5* 3.9 3.8  CL 98 96  < > 99 99 100 102 103  CO2 31 29  < > 31 31 30 30 30   GLUCOSE 125* 121*  < > 159* 155* 110* 104* 104*  BUN 8 9  < > 8 7 7 7 7   CREATININE 1.24* 1.21*  < > 1.24* 1.20* 1.15* 1.10 1.11*  CALCIUM 8.9 9.1  < > 8.8 8.8 8.8 9.0 9.0  MG  --  1.7  --   --   --   --   --  1.8  < > = values in this interval not displayed. Liver Function Tests:  Recent Labs Lab 12/05/13 1937 12/06/13 0230 12/07/13 0411 12/08/13 0502  AST 38* 30 23 23   ALT 37* 33 20 18  ALKPHOS 158* 136* 98 104  BILITOT <0.2* <0.2* 0.3 0.5  PROT 8.8* 7.6 6.2 6.2  ALBUMIN 4.2 3.6 2.9* 2.7*    Recent Labs Lab 12/05/13 1937 12/06/13 0230 12/06/13 1325 12/07/13 0411 12/08/13 0502  LIPASE 1455* 1591* 1192* 894* 289*   No results found for this basename: AMMONIA,  in the last 168 hours CBC:  Recent Labs Lab 12/05/13 1937 12/05/13 2110 12/06/13 0100 12/06/13 0230 12/08/13 0502  WBC 11.9* 12.2* 16.3* 15.2* 5.2  NEUTROABS 9.9*  --  13.5*  --  3.4  HGB 11.9* 10.3* 10.8* 10.8* 8.9*  HCT 36.4 31.8* 34.2* 33.6* 27.0*  MCV 91.0 91.4 92.2 91.3 89.1  PLT 431* 355 391 378 219   Cardiac Enzymes: No results found for this basename: CKTOTAL, CKMB, CKMBINDEX, TROPONINI,  in the last 168 hours BNP (last 3 results) No results found for this basename: PROBNP,  in the last 8760 hours CBG:  Recent Labs Lab 12/08/13 0033 12/08/13 0137 12/08/13 0745 12/08/13 1105 12/08/13 1649  GLUCAP 127* 128* 162* 114* 306*    Recent Results (from the past 240 hour(s))  MRSA PCR SCREENING     Status:  None   Collection Time    12/05/13 11:55 PM      Result Value Ref Range Status   MRSA by PCR NEGATIVE  NEGATIVE Final   Comment:            The GeneXpert MRSA Assay (FDA     approved for NASAL specimens     only), is one component of a     comprehensive MRSA colonization     surveillance program. It is not     intended to diagnose MRSA     infection nor to guide or     monitor treatment for     MRSA infections.  URINE CULTURE     Status: None   Collection Time  12/06/13  4:52 AM      Result Value Ref Range Status   Specimen Description URINE, RANDOM   Final   Special Requests NONE   Final   Culture  Setup Time     Final   Value: 12/06/2013 05:00     Performed at [facility]     Final   Value: NO GROWTH     Performed at [facility]   Culture     Final   Value: NO GROWTH     Performed at [facility]   Report Status 12/07/2013 FINAL   Final  CULTURE, BLOOD (ROUTINE X 2)     Status: None   Collection Time    12/06/13  8:20 PM      Result Value Ref Range Status   Specimen Description BLOOD LEFT ARM   Final   Special Requests BOTTLES DRAWN AEROBIC ONLY 8CC   Final   Culture  Setup Time     Final   Value: 12/07/2013 00:08     Performed at [facility]   Culture     Final   Value:        BLOOD CULTURE RECEIVED NO GROWTH TO DATE CULTURE WILL BE HELD FOR 5 DAYS BEFORE ISSUING A FINAL NEGATIVE REPORT     Performed at [facility]   Report Status PENDING   Incomplete  CULTURE, BLOOD (ROUTINE X 2)     Status: None   Collection Time    12/06/13  8:30 PM      Result Value Ref Range Status   Specimen Description BLOOD LEFT HAND   Final   Special Requests BOTTLES DRAWN AEROBIC ONLY 6CC   Final   Culture  Setup Time     Final   Value: 12/07/2013 00:08     Performed at [facility]   Culture     Final   Value:        BLOOD CULTURE RECEIVED NO GROWTH TO DATE CULTURE WILL BE HELD FOR 5 DAYS BEFORE ISSUING A FINAL  NEGATIVE REPORT     Performed at [facility]   Report Status PENDING   Incomplete     Studies:  Recent x-ray studies have been reviewed in detail by the Attending Physician  Scheduled Meds:  Scheduled Meds: . insulin aspart  6 Units Subcutaneous TID WC  . insulin glargine  26 Units Subcutaneous QHS  . sodium chloride  3 mL Intravenous Q12H    Time spent on care of this patient: 40 mins   Allie Bossier , MD   [facility] Office  [phone] Pager [phone]  On-Call/Text Page:      [email or web address]      password [email or web address]  If 7PM-7AM, please contact night-coverage [email or web address] Password [email or web address] 12/08/2013, 5:03 PM   LOS: 3 days

## 2013-12-08 NOTE — Progress Notes (Signed)
Patient's right hand is slightly swollen then the left- warm compression has been given and arm has been elevated.

## 2013-12-09 DIAGNOSIS — E860 Dehydration: Secondary | ICD-10-CM | POA: Diagnosis not present

## 2013-12-09 DIAGNOSIS — R7989 Other specified abnormal findings of blood chemistry: Secondary | ICD-10-CM | POA: Diagnosis not present

## 2013-12-09 DIAGNOSIS — N179 Acute kidney failure, unspecified: Secondary | ICD-10-CM | POA: Diagnosis not present

## 2013-12-09 DIAGNOSIS — E872 Acidosis, unspecified: Secondary | ICD-10-CM | POA: Diagnosis not present

## 2013-12-09 LAB — GLUCOSE, CAPILLARY
GLUCOSE-CAPILLARY: 119 mg/dL — AB (ref 70–99)
GLUCOSE-CAPILLARY: 136 mg/dL — AB (ref 70–99)
Glucose-Capillary: 80 mg/dL (ref 70–99)
Glucose-Capillary: 188 mg/dL — ABNORMAL HIGH (ref 70–99)

## 2013-12-09 LAB — LIPASE, BLOOD: LIPASE: 456 U/L — AB (ref 11–59)

## 2013-12-09 MED ORDER — INSULIN GLARGINE 100 UNIT/ML SOLOSTAR PEN: 28.0000 [IU] | PEN_INJECTOR | Freq: Every day | SUBCUTANEOUS | Status: DC

## 2013-12-09 MED ORDER — INSULIN PEN NEEDLE 31G X 6 MM MISC: 28.0000 [IU] | Freq: Every day | Status: DC

## 2013-12-09 MED ORDER — INSULIN ASPART 100 UNIT/ML FLEXPEN: 6.0000 [IU] | PEN_INJECTOR | Freq: Three times a day (TID) | SUBCUTANEOUS | Status: DC

## 2013-12-09 NOTE — Progress Notes (Signed)
Patient was discharged home by MD order; discharged instructions review and give to patient with care notes and prescriptions; IV DIC; skin intact; patient will be escorted to the car by nurse tech via wheelchair.  

## 2013-12-09 NOTE — Discharge Summary (Addendum)
Physician Discharge Summary  KITZIA CAMUS OFB:[id number] DOB: Nov 25, 1990 DOA: 12/05/2013  PCP: No primary Dondra Rhett on file.  Admit date: 12/05/2013 Discharge date: 12/09/2013  Time spent: 40 minutes  Recommendations for Outpatient Follow-up: DKA (diabetic ketoacidoses)  - DKA order set in place; IV fluids (NS bolus given in ED), continue maintenance IV fluids with CGB's check Q 1 hour and BMP Q 2 hours until AG closes or bicarbonate improves  - 6/18 hemoglobin A1c= 17  - 6/19 DC Bicarb in Sterile water 134m/hr  -6/20 resolved by: Current AG= 11 -6/21 patient has been counseled extensively on how to better control her diabetes. This will include keeping a journal of her CBGs (min QAC/QHS), and all food consumed each day. Patient is to take this blood work to her followup appointment with her PCP.   Uncontrolled diabetes with renal manifestation  -Discharge on Lantus 28 units daily  -Discharge on  NovoLog 6 units QAC  -Followup with PCP for further adjustments to medication regimen; NOTE patient states has an appointment on 8 July with a new PCP does not have the name currently available.   Hypokalemia  -Resolved, continue to follow   Leukocytosis  - Resolved, UA, blood culture NTD   Acute renal failure  - secondary to prerenal etiology, dehydration  - Resolved, Cr still mildly elevated but significantly improved  -PCP to trend patient's renal function.  Abnormal LFTs  - unclear etiology; abdominal [place]ondiagnostic, improving with resolution of DKA., - Acute Hepatitis panel (negative)  -UDS/alcohol level negative  - 6/20 resolved -PCP to monitor   Elevated lipase  - lipase on admission 1455; although still elevated at discharge (456) clinically patient improved   -Would not obtain CT with contrast daily secondary to acute renal failure. If still concerned for chronic vs acute pancreatitis were obtained as outpatient after allowing patient's kidneys to fully recover.   -PCP to decide if further workup required an asymptomatic patient  Hypernatremia -resolved    Discharge Diagnoses:  Principal Problem:   DKA (diabetic ketoacidoses) Active Problems:   Leukocytosis   Acute renal failure   Hypercalcemia   Abnormal LFTs   Elevated lipase   Noncompliance   Hypokalemia   Type II or unspecified type diabetes mellitus with renal manifestations, uncontrolled   Discharge Condition: Stable  Diet recommendation: Diabetic  Filed Weights   12/07/13 0425 12/08/13 0400 12/09/13 0421  Weight: 53.1 kg (117 lb 1 oz) 52 kg (114 lb 10.2 oz) 54.885 kg (121 lb)    History of present illness:  23yo BF PMHx Diabetes Type 2[facility]  [facility] with reports of nausea and vomiting started about 1 day prior to this admission. Her CBG at home was in 500 range when she checked it. She reported she used her insulin as prescribed but her CBG's still read high. She has very minimal PO intake and was severely nauseas. No chest pain, no shortness of breath or palpitations. No reprots of blood in stool or urine. No fevers or chills. NO GU complaints.  In ED, vitals are as follows: BP 114/71, HR 117; RR 18; T max 97.5 F and oxygen saturation 100% on room air. Blood work revealed glucose of 861, AG of 42, CBG > 600, venous blood gas with pH of 7.13 and bicarbonate 5.9; on BMP she was further found to have CO2 of less than 7, creatinine 1.4, calcium 10.7, AST 38, ALT 37, ALP 158, lipase 1455. UA  showed glucose >1,000, ketones > 80, negative leukocytes or nitrites.  Patient's DKA has resolved, and a safe for discharge. During the stay patient met with the diabetic coordinator and was counseled on diabetic nutrition, the correct way to monitor her CBG (QAC/QHS).  Consultants:  NA    Procedure/Significant Events:  6/17 Ultrasound abdomen complete; Hepatomegaly with echogenic liver compared to the right kidney compatible with  hepatosteatosis. No mass lesion or intrahepatic biliary ductal dilation.  -Negative for cholelithiasis or cholecystitis.    Culture  6/17 MRSA by PCR negative  6/18 urine NTD (final)  6/18 blood left arm/left hand NTD    Antibiotics:  NA     Discharge Exam: Filed Vitals:   12/09/13 0417 12/09/13 0421 12/09/13 1009 12/09/13 1341  BP: 99/70  100/70 110/78  Pulse: 95  95 101  Temp: 97.9 F (36.6 C)  98.5 F (36.9 C) 98.2 F (36.8 C)  TempSrc: Oral  Oral Oral  Resp: '20  16 18  ' Height:      Weight:  54.885 kg (121 lb)    SpO2: 100%  100% 100%   General: A./O. x4, NAD, No acute respiratory distress  Lungs: Clear to auscultation bilaterally without wheezes or crackles  Cardiovascular: Regular rate and rhythm without murmur gallop or rub normal S1 and S2  Abdomen: Nontender to palpation, nondistended, soft, bowel sounds positive, no rebound, no ascites, no appreciable mass  Extremities: No significant cyanosis, clubbing, or edema bilateral lower extremities  Discharge Instructions     Medication List    STOP taking these medications       insulin aspart 100 UNIT/ML injection  Commonly known as:  novoLOG  Replaced by:  insulin aspart 100 UNIT/ML FlexPen     NOVOLOG MIX 70/30 [place]      TAKE these medications       ibuprofen 200 MG tablet  Commonly known as:  ADVIL,MOTRIN  Take 400 mg by mouth as needed.     insulin aspart 100 UNIT/ML FlexPen  Commonly known as:  NOVOLOG FLEXPEN  Inject 6 Units into the skin 3 (three) times daily with meals.     Insulin Glargine 100 UNIT/ML Solostar Pen  Commonly known as:  LANTUS SOLOSTAR  Inject 28 Units into the skin daily at 10 pm.     Insulin Pen Needle 31G X 6 MM Misc  28 Units by Does not apply route at bedtime.       No Known Allergies    The results of significant diagnostics from this hospitalization (including imaging, microbiology, ancillary and laboratory) are listed below for reference.    Significant  Diagnostic Studies: US Abdomen Complete  12/05/2013   CLINICAL DATA:  Abnormal liver function tests.  EXAM: ULTRASOUND ABDOMEN COMPLETE  COMPARISON:  None.  FINDINGS: Gallbladder:  No gallstones or wall thickening visualized. No sonographic Murphy sign noted.  Common bile duct:  Diameter: 3 mm, normal.  Liver:  Echogenic. Hepatomegaly. No focal mass lesion. Liver span is 20 cm.  IVC:  No abnormality visualized.  Pancreas:  Visualized portion unremarkable.  Spleen:  Size and appearance within normal limits.  Right Kidney:  Length: 12.8 cm. Probable extrarenal pelvis. No stones or hydronephrosis.  Left Kidney:  Length: 11.9 cm. Echogenicity within normal limits. No mass or hydronephrosis visualized.  Abdominal aorta:  No aneurysm visualized.  Other findings:  None.  IMPRESSION: 1. Hepatomegaly with echogenic liver compared to the right kidney compatible with hepatosteatosis. No mass lesion or intrahepatic biliary ductal dilation. 2.  Negative for cholelithiasis or cholecystitis.   Electronically Signed   By: Dereck Ligas M.D.   On: 12/05/2013 22:44    Microbiology: Recent Results (from the past 240 hour(s))  MRSA PCR SCREENING     Status: None   Collection Time    12/05/13 11:55 PM      Result Value Ref Range Status   MRSA by PCR NEGATIVE  NEGATIVE Final   Comment:            The GeneXpert MRSA Assay (FDA     approved for NASAL specimens     only), is one component of a     comprehensive MRSA colonization     surveillance program. It is not     intended to diagnose MRSA     infection nor to guide or     monitor treatment for     MRSA infections.  URINE CULTURE     Status: None   Collection Time    12/06/13  4:52 AM      Result Value Ref Range Status   Specimen Description URINE, RANDOM   Final   Special Requests NONE   Final   Culture  Setup Time     Final   Value: 12/06/2013 05:00     Performed at [facility] Count     Final   Value: NO GROWTH     Performed at  [facility]   Culture     Final   Value: NO GROWTH     Performed at [facility]   Report Status 12/07/2013 FINAL   Final  CULTURE, BLOOD (ROUTINE X 2)     Status: None   Collection Time    12/06/13  8:20 PM      Result Value Ref Range Status   Specimen Description BLOOD LEFT ARM   Final   Special Requests BOTTLES DRAWN AEROBIC ONLY 8CC   Final   Culture  Setup Time     Final   Value: 12/07/2013 00:08     Performed at [facility]   Culture     Final   Value:        BLOOD CULTURE RECEIVED NO GROWTH TO DATE CULTURE WILL BE HELD FOR 5 DAYS BEFORE ISSUING A FINAL NEGATIVE REPORT     Performed at [facility]   Report Status PENDING   Incomplete  CULTURE, BLOOD (ROUTINE X 2)     Status: None   Collection Time    12/06/13  8:30 PM      Result Value Ref Range Status   Specimen Description BLOOD LEFT HAND   Final   Special Requests BOTTLES DRAWN AEROBIC ONLY 6CC   Final   Culture  Setup Time     Final   Value: 12/07/2013 00:08     Performed at [facility]   Culture     Final   Value:        BLOOD CULTURE RECEIVED NO GROWTH TO DATE CULTURE WILL BE HELD FOR 5 DAYS BEFORE ISSUING A FINAL NEGATIVE REPORT     Performed at [facility]   Report Status PENDING   Incomplete     Labs: Basic Metabolic Panel:  Recent Labs Lab 12/07/13 1548 12/07/13 1625  12/07/13 2108 12/07/13 2255 12/08/13 0056 12/08/13 0501 12/08/13 0502  NA 142 141  < > 142 143 143 144 144  K 2.7* 3.0*  < > 3.1* 3.4* 3.5*  3.9 3.8  CL 98 96  < > 99 99 100 102 103  CO2 31 29  < > '31 31 30 30 30  ' GLUCOSE 125* 121*  < > 159* 155* 110* 104* 104*  BUN 8 9  < > '8 7 7 7 7  ' CREATININE 1.24* 1.21*  < > 1.24* 1.20* 1.15* 1.10 1.11*  CALCIUM 8.9 9.1  < > 8.8 8.8 8.8 9.0 9.0  MG  --  1.7  --   --   --   --   --  1.8  < > = values in this interval not displayed. Liver Function Tests:  Recent Labs Lab 12/05/13 1937 12/06/13 0230 12/07/13 0411 12/08/13 0502  AST 38* '30  23 23  ' ALT 37* 33 20 18  ALKPHOS 158* 136* 98 104  BILITOT <0.2* <0.2* 0.3 0.5  PROT 8.8* 7.6 6.2 6.2  ALBUMIN 4.2 3.6 2.9* 2.7*    Recent Labs Lab 12/06/13 0230 12/06/13 1325 12/07/13 0411 12/08/13 0502 12/09/13 0424  LIPASE 1591* 1192* 894* 289* 456*   No results found for this basename: AMMONIA,  in the last 168 hours CBC:  Recent Labs Lab 12/05/13 1937 12/05/13 2110 12/06/13 0100 12/06/13 0230 12/08/13 0502  WBC 11.9* 12.2* 16.3* 15.2* 5.2  NEUTROABS 9.9*  --  13.5*  --  3.4  HGB 11.9* 10.3* 10.8* 10.8* 8.9*  HCT 36.4 31.8* 34.2* 33.6* 27.0*  MCV 91.0 91.4 92.2 91.3 89.1  PLT 431* 355 391 378 219   Cardiac Enzymes: No results found for this basename: CKTOTAL, CKMB, CKMBINDEX, TROPONINI,  in the last 168 hours BNP: BNP (last 3 results) No results found for this basename: PROBNP,  in the last 8760 hours CBG:  Recent Labs Lab 12/08/13 2059 12/09/13 0018 12/09/13 0415 12/09/13 0753 12/09/13 1151  GLUCAP 348* 119* 136* 80 188*       Signed:  Dia Crawford, MD [facility] [phone] pager

## 2013-12-13 LAB — CULTURE, BLOOD (ROUTINE X 2)
Culture: NO GROWTH
Culture: NO GROWTH

## 2014-02-12 ENCOUNTER — Inpatient Hospital Stay (HOSPITAL_COMMUNITY): Payer: Medicaid Other

## 2014-02-12 ENCOUNTER — Encounter (HOSPITAL_COMMUNITY): Payer: Self-pay | Admitting: Emergency Medicine

## 2014-02-12 ENCOUNTER — Inpatient Hospital Stay (HOSPITAL_COMMUNITY)
Admission: EM | Admit: 2014-02-12 | Discharge: 2014-02-15 | DRG: 638 | Disposition: A | Discharge status: Home / Self Care | Payer: Medicaid Other | Attending: Internal Medicine | Admitting: Internal Medicine

## 2014-02-12 DIAGNOSIS — E1140 Type 2 diabetes mellitus with diabetic neuropathy, unspecified: Secondary | ICD-10-CM

## 2014-02-12 DIAGNOSIS — E1029 Type 1 diabetes mellitus with other diabetic kidney complication: Secondary | ICD-10-CM | POA: Diagnosis present

## 2014-02-12 DIAGNOSIS — E1065 Type 1 diabetes mellitus with hyperglycemia: Secondary | ICD-10-CM | POA: Diagnosis present

## 2014-02-12 DIAGNOSIS — E1049 Type 1 diabetes mellitus with other diabetic neurological complication: Secondary | ICD-10-CM | POA: Diagnosis present

## 2014-02-12 DIAGNOSIS — E1165 Type 2 diabetes mellitus with hyperglycemia: Secondary | ICD-10-CM

## 2014-02-12 DIAGNOSIS — Z794 Long term (current) use of insulin: Secondary | ICD-10-CM

## 2014-02-12 DIAGNOSIS — N189 Chronic kidney disease, unspecified: Secondary | ICD-10-CM | POA: Diagnosis present

## 2014-02-12 DIAGNOSIS — R000 Tachycardia, unspecified: Secondary | ICD-10-CM | POA: Diagnosis present

## 2014-02-12 DIAGNOSIS — Z9119 Patient's noncompliance with other medical treatment and regimen: Secondary | ICD-10-CM | POA: Diagnosis not present

## 2014-02-12 DIAGNOSIS — R0989 Other specified symptoms and signs involving the circulatory and respiratory systems: Secondary | ICD-10-CM | POA: Diagnosis present

## 2014-02-12 DIAGNOSIS — E860 Dehydration: Secondary | ICD-10-CM | POA: Diagnosis present

## 2014-02-12 DIAGNOSIS — R748 Abnormal levels of other serum enzymes: Secondary | ICD-10-CM

## 2014-02-12 DIAGNOSIS — R109 Unspecified abdominal pain: Secondary | ICD-10-CM | POA: Diagnosis not present

## 2014-02-12 DIAGNOSIS — N179 Acute kidney failure, unspecified: Secondary | ICD-10-CM | POA: Diagnosis not present

## 2014-02-12 DIAGNOSIS — E1142 Type 2 diabetes mellitus with diabetic polyneuropathy: Secondary | ICD-10-CM | POA: Diagnosis present

## 2014-02-12 DIAGNOSIS — Z91199 Patient's noncompliance with other medical treatment and regimen due to unspecified reason: Secondary | ICD-10-CM | POA: Diagnosis not present

## 2014-02-12 DIAGNOSIS — E1129 Type 2 diabetes mellitus with other diabetic kidney complication: Secondary | ICD-10-CM | POA: Diagnosis not present

## 2014-02-12 DIAGNOSIS — R0609 Other forms of dyspnea: Secondary | ICD-10-CM | POA: Diagnosis present

## 2014-02-12 DIAGNOSIS — E1010 Type 1 diabetes mellitus with ketoacidosis without coma: Principal | ICD-10-CM | POA: Diagnosis present

## 2014-02-12 DIAGNOSIS — R1084 Generalized abdominal pain: Secondary | ICD-10-CM

## 2014-02-12 DIAGNOSIS — E119 Type 2 diabetes mellitus without complications: Secondary | ICD-10-CM

## 2014-02-12 DIAGNOSIS — IMO0002 Reserved for concepts with insufficient information to code with codable children: Secondary | ICD-10-CM | POA: Diagnosis present

## 2014-02-12 DIAGNOSIS — IMO0001 Reserved for inherently not codable concepts without codable children: Secondary | ICD-10-CM | POA: Diagnosis present

## 2014-02-12 DIAGNOSIS — E876 Hypokalemia: Secondary | ICD-10-CM

## 2014-02-12 HISTORY — DX: Type 1 diabetes mellitus without complications: E10.9

## 2014-02-12 LAB — BASIC METABOLIC PANEL
ANION GAP: 18 — AB (ref 5–15)
ANION GAP: 16 — AB (ref 5–15)
ANION GAP: 12 (ref 5–15)
Anion gap: 30 — ABNORMAL HIGH (ref 5–15)
Anion gap: 23 — ABNORMAL HIGH (ref 5–15)
Anion gap: 16 — ABNORMAL HIGH (ref 5–15)
BUN: 34 mg/dL — ABNORMAL HIGH (ref 6–23)
BUN: 28 mg/dL — ABNORMAL HIGH (ref 6–23)
BUN: 25 mg/dL — ABNORMAL HIGH (ref 6–23)
BUN: 21 mg/dL (ref 6–23)
BUN: 20 mg/dL (ref 6–23)
BUN: 19 mg/dL (ref 6–23)
CALCIUM: 10.3 mg/dL (ref 8.4–10.5)
CHLORIDE: 108 meq/L (ref 96–112)
CHLORIDE: 112 meq/L (ref 96–112)
CHLORIDE: 110 meq/L (ref 96–112)
CHLORIDE: 112 meq/L (ref 96–112)
CO2: 11 meq/L — AB (ref 19–32)
CO2: 10 meq/L — AB (ref 19–32)
CO2: 13 mEq/L — ABNORMAL LOW (ref 19–32)
CO2: 15 mEq/L — ABNORMAL LOW (ref 19–32)
CO2: 17 mEq/L — ABNORMAL LOW (ref 19–32)
CO2: 14 mEq/L — ABNORMAL LOW (ref 19–32)
CREATININE: 1.46 mg/dL — AB (ref 0.50–1.10)
CREATININE: 1.31 mg/dL — AB (ref 0.50–1.10)
Calcium: 8.4 mg/dL (ref 8.4–10.5)
Calcium: 8.6 mg/dL (ref 8.4–10.5)
Calcium: 8.7 mg/dL (ref 8.4–10.5)
Calcium: 8.3 mg/dL — ABNORMAL LOW (ref 8.4–10.5)
Calcium: 8.7 mg/dL (ref 8.4–10.5)
Chloride: 88 mEq/L — ABNORMAL LOW (ref 96–112)
Chloride: 112 mEq/L (ref 96–112)
Creatinine, Ser: 1.96 mg/dL — ABNORMAL HIGH (ref 0.50–1.10)
Creatinine, Ser: 1.55 mg/dL — ABNORMAL HIGH (ref 0.50–1.10)
Creatinine, Ser: 1.36 mg/dL — ABNORMAL HIGH (ref 0.50–1.10)
Creatinine, Ser: 1.32 mg/dL — ABNORMAL HIGH (ref 0.50–1.10)
GFR calc Af Amer: 41 mL/min — ABNORMAL LOW (ref >90)
GFR calc Af Amer: 54 mL/min — ABNORMAL LOW (ref >90)
GFR calc Af Amer: 58 mL/min — ABNORMAL LOW (ref >90)
GFR calc non Af Amer: 35 mL/min — ABNORMAL LOW (ref >90)
GFR calc non Af Amer: 47 mL/min — ABNORMAL LOW (ref >90)
GFR calc non Af Amer: 50 mL/min — ABNORMAL LOW (ref >90)
GFR calc non Af Amer: 55 mL/min — ABNORMAL LOW (ref >90)
GFR calc non Af Amer: 57 mL/min — ABNORMAL LOW (ref >90)
GFR calc non Af Amer: 57 mL/min — ABNORMAL LOW (ref >90)
GFR, EST AFRICAN AMERICAN: 63 mL/min — AB (ref >90)
GFR, EST AFRICAN AMERICAN: 66 mL/min — AB (ref >90)
GFR, EST AFRICAN AMERICAN: 66 mL/min — AB (ref >90)
GLUCOSE: 393 mg/dL — AB (ref 70–99)
Glucose, Bld: 618 mg/dL (ref 70–99)
Glucose, Bld: 211 mg/dL — ABNORMAL HIGH (ref 70–99)
Glucose, Bld: 192 mg/dL — ABNORMAL HIGH (ref 70–99)
Glucose, Bld: 147 mg/dL — ABNORMAL HIGH (ref 70–99)
Glucose, Bld: 168 mg/dL — ABNORMAL HIGH (ref 70–99)
POTASSIUM: 4.5 meq/L (ref 3.7–5.3)
POTASSIUM: 3.8 meq/L (ref 3.7–5.3)
POTASSIUM: 4.7 meq/L (ref 3.7–5.3)
POTASSIUM: 4.4 meq/L (ref 3.7–5.3)
POTASSIUM: 4.2 meq/L (ref 3.7–5.3)
Potassium: 4.9 mEq/L (ref 3.7–5.3)
SODIUM: 141 meq/L (ref 137–147)
Sodium: 129 mEq/L — ABNORMAL LOW (ref 137–147)
Sodium: 143 mEq/L (ref 137–147)
Sodium: 143 mEq/L (ref 137–147)
Sodium: 139 mEq/L (ref 137–147)
Sodium: 142 mEq/L (ref 137–147)

## 2014-02-12 LAB — POC URINE PREG, ED: PREG TEST UR: NEGATIVE

## 2014-02-12 LAB — HCG, SERUM, QUALITATIVE: Preg, Serum: NEGATIVE

## 2014-02-12 LAB — CBC
HCT: 40.7 % (ref 36.0–46.0)
HEMOGLOBIN: 13.4 g/dL (ref 12.0–15.0)
MCH: 27.2 pg (ref 26.0–34.0)
MCHC: 32.9 g/dL (ref 30.0–36.0)
MCV: 82.6 fL (ref 78.0–100.0)
Platelets: 407 10*3/uL — ABNORMAL HIGH (ref 150–400)
RBC: 4.93 MIL/uL (ref 3.87–5.11)
RDW: 14.3 % (ref 11.5–15.5)
WBC: 5.5 10*3/uL (ref 4.0–10.5)

## 2014-02-12 LAB — URINALYSIS, ROUTINE W REFLEX MICROSCOPIC
Bilirubin Urine: NEGATIVE
Glucose, UA: >1000 mg/dL — AB
Ketones, ur: >80 mg/dL — AB
Leukocytes, UA: NEGATIVE
NITRITE: NEGATIVE
Protein, ur: 100 mg/dL — AB
SPECIFIC GRAVITY, URINE: 1.021 (ref 1.005–1.030)
Urobilinogen, UA: 0.2 mg/dL (ref 0.0–1.0)
pH: 5.5 (ref 5.0–8.0)

## 2014-02-12 LAB — COMPREHENSIVE METABOLIC PANEL
ALK PHOS: 114 U/L (ref 39–117)
ALT: 6 U/L (ref 0–35)
AST: 7 U/L (ref 0–37)
Albumin: 3.3 g/dL — ABNORMAL LOW (ref 3.5–5.2)
Anion gap: 28 — ABNORMAL HIGH (ref 5–15)
BILIRUBIN TOTAL: 0.2 mg/dL — AB (ref 0.3–1.2)
BUN: 31 mg/dL — ABNORMAL HIGH (ref 6–23)
CHLORIDE: 97 meq/L (ref 96–112)
CO2: 9 meq/L — AB (ref 19–32)
Calcium: 8.9 mg/dL (ref 8.4–10.5)
Creatinine, Ser: 1.69 mg/dL — ABNORMAL HIGH (ref 0.50–1.10)
GFR calc Af Amer: 49 mL/min — ABNORMAL LOW (ref >90)
GFR, EST NON AFRICAN AMERICAN: 42 mL/min — AB (ref >90)
Glucose, Bld: 549 mg/dL — ABNORMAL HIGH (ref 70–99)
Potassium: 5.1 mEq/L (ref 3.7–5.3)
SODIUM: 134 meq/L — AB (ref 137–147)
Total Protein: 7.7 g/dL (ref 6.0–8.3)

## 2014-02-12 LAB — RAPID URINE DRUG SCREEN, HOSP PERFORMED
AMPHETAMINES: NOT DETECTED
BARBITURATES: NOT DETECTED
BENZODIAZEPINES: NOT DETECTED
Cocaine: NOT DETECTED
Opiates: NOT DETECTED
Tetrahydrocannabinol: NOT DETECTED

## 2014-02-12 LAB — I-STAT VENOUS BLOOD GAS, ED
Acid-base deficit: 13 mmol/L — ABNORMAL HIGH (ref 0.0–2.0)
Bicarbonate: 13 mEq/L — ABNORMAL LOW (ref 20.0–24.0)
O2 Saturation: 50 %
PCO2 VEN: 28.8 mmHg — AB (ref 45.0–50.0)
PO2 VEN: 29 mmHg — AB (ref 30.0–45.0)
Patient temperature: 97.5
TCO2: 14 mmol/L (ref 0–100)
pH, Ven: 7.26 (ref 7.250–7.300)

## 2014-02-12 LAB — CBG MONITORING, ED
GLUCOSE-CAPILLARY: 328 mg/dL — AB (ref 70–99)
GLUCOSE-CAPILLARY: 230 mg/dL — AB (ref 70–99)
Glucose-Capillary: 527 mg/dL — ABNORMAL HIGH (ref 70–99)
Glucose-Capillary: 455 mg/dL — ABNORMAL HIGH (ref 70–99)
Glucose-Capillary: 439 mg/dL — ABNORMAL HIGH (ref 70–99)
Glucose-Capillary: 382 mg/dL — ABNORMAL HIGH (ref 70–99)
Glucose-Capillary: 188 mg/dL — ABNORMAL HIGH (ref 70–99)

## 2014-02-12 LAB — GLUCOSE, CAPILLARY
GLUCOSE-CAPILLARY: 192 mg/dL — AB (ref 70–99)
GLUCOSE-CAPILLARY: 154 mg/dL — AB (ref 70–99)
Glucose-Capillary: 185 mg/dL — ABNORMAL HIGH (ref 70–99)
Glucose-Capillary: 132 mg/dL — ABNORMAL HIGH (ref 70–99)
Glucose-Capillary: 154 mg/dL — ABNORMAL HIGH (ref 70–99)

## 2014-02-12 LAB — LACTIC ACID, PLASMA: Lactic Acid, Venous: 1.5 mmol/L (ref 0.5–2.2)

## 2014-02-12 LAB — ACETAMINOPHEN LEVEL: Acetaminophen (Tylenol), Serum: <15 ug/mL (ref 10–30)

## 2014-02-12 LAB — URINE MICROSCOPIC-ADD ON

## 2014-02-12 LAB — CREATININE, URINE, RANDOM: Creatinine, Urine: 38.33 mg/dL

## 2014-02-12 LAB — ETHANOL: Alcohol, Ethyl (B): <11 mg/dL (ref 0–11)

## 2014-02-12 LAB — MRSA PCR SCREENING: MRSA by PCR: NEGATIVE

## 2014-02-12 LAB — OSMOLALITY: Osmolality: 332 mOsm/kg — ABNORMAL HIGH (ref 275–300)

## 2014-02-12 LAB — TROPONIN I: Troponin I: <0.3 ng/mL (ref <0.30)

## 2014-02-12 LAB — KETONES, QUALITATIVE

## 2014-02-12 LAB — MAGNESIUM: MAGNESIUM: 2.1 mg/dL (ref 1.5–2.5)

## 2014-02-12 LAB — SALICYLATE LEVEL: Salicylate Lvl: <2 mg/dL — ABNORMAL LOW (ref 2.8–20.0)

## 2014-02-12 LAB — LIPASE, BLOOD: Lipase: 33 U/L (ref 11–59)

## 2014-02-12 LAB — PHOSPHORUS: Phosphorus: 4.3 mg/dL (ref 2.3–4.6)

## 2014-02-12 LAB — HIV ANTIBODY (ROUTINE TESTING W REFLEX): HIV 1&2 Ab, 4th Generation: NONREACTIVE

## 2014-02-12 LAB — SODIUM, URINE, RANDOM: Sodium, Ur: 46 mEq/L

## 2014-02-12 MED ORDER — SODIUM CHLORIDE 0.9 % IV BOLUS (SEPSIS): 1000.0000 mL | Freq: Once | INTRAVENOUS | Status: DC

## 2014-02-12 MED ORDER — POTASSIUM CHLORIDE 10 MEQ/100ML IV SOLN
10.0000 meq | INTRAVENOUS | Status: AC
  Administered 2014-02-12 (×2): 10 meq via INTRAVENOUS
  Filled 2014-02-12 (×2): qty 100

## 2014-02-12 MED ORDER — DEXTROSE-NACL 5-0.45 % IV SOLN
INTRAVENOUS | Status: DC
  Administered 2014-02-12: 200 mL/h via INTRAVENOUS

## 2014-02-12 MED ORDER — ONDANSETRON HCL 4 MG/2ML IJ SOLN
4.0000 mg | INTRAMUSCULAR | Status: DC | PRN
  Administered 2014-02-12 – 2014-02-13 (×3): 4 mg via INTRAVENOUS
  Filled 2014-02-12 (×3): qty 2

## 2014-02-12 MED ORDER — DEXTROSE 50 % IV SOLN: 25.0000 mL | INTRAVENOUS | Status: DC | PRN

## 2014-02-12 MED ORDER — SODIUM CHLORIDE 0.9 % IJ SOLN
3.0000 mL | Freq: Two times a day (BID) | INTRAMUSCULAR | Status: DC
  Administered 2014-02-13 – 2014-02-15 (×3): 3 mL via INTRAVENOUS

## 2014-02-12 MED ORDER — SODIUM CHLORIDE 0.9 % IV BOLUS (SEPSIS)
1000.0000 mL | Freq: Once | INTRAVENOUS | Status: AC
  Administered 2014-02-12: 1000 mL via INTRAVENOUS

## 2014-02-12 MED ORDER — ACETAMINOPHEN 650 MG RE SUPP: 650.0000 mg | Freq: Four times a day (QID) | RECTAL | Status: DC | PRN

## 2014-02-12 MED ORDER — SODIUM CHLORIDE 0.9 % IV SOLN
INTRAVENOUS | Status: DC
  Administered 2014-02-12: 3.8 [IU]/h via INTRAVENOUS
  Filled 2014-02-12 (×2): qty 2.5

## 2014-02-12 MED ORDER — MORPHINE SULFATE 2 MG/ML IJ SOLN
1.0000 mg | INTRAMUSCULAR | Status: DC | PRN
  Administered 2014-02-12 – 2014-02-13 (×4): 1 mg via INTRAVENOUS
  Filled 2014-02-12 (×4): qty 1

## 2014-02-12 MED ORDER — INSULIN REGULAR BOLUS VIA INFUSION
0.0000 [IU] | Freq: Three times a day (TID) | INTRAVENOUS | Status: DC
  Filled 2014-02-12: qty 10

## 2014-02-12 MED ORDER — ACETAMINOPHEN 325 MG PO TABS
650.0000 mg | ORAL_TABLET | Freq: Four times a day (QID) | ORAL | Status: DC | PRN
  Administered 2014-02-14: 650 mg via ORAL
  Filled 2014-02-12: qty 2

## 2014-02-12 MED ORDER — SODIUM CHLORIDE 0.9 % IV SOLN: INTRAVENOUS | Status: DC

## 2014-02-12 MED ORDER — ACETAMINOPHEN 325 MG PO TABS
650.0000 mg | ORAL_TABLET | Freq: Once | ORAL | Status: AC
  Administered 2014-02-12: 650 mg via ORAL
  Filled 2014-02-12: qty 2

## 2014-02-12 MED ORDER — HEPARIN SODIUM (PORCINE) 5000 UNIT/ML IJ SOLN
5000.0000 [IU] | Freq: Three times a day (TID) | INTRAMUSCULAR | Status: DC
  Administered 2014-02-13 – 2014-02-15 (×6): 5000 [IU] via SUBCUTANEOUS
  Filled 2014-02-12 (×10): qty 1

## 2014-02-12 MED ORDER — SODIUM CHLORIDE 0.9 % IV SOLN
INTRAVENOUS | Status: DC
  Administered 2014-02-12: 200 mL/h via INTRAVENOUS

## 2014-02-12 MED ORDER — DEXTROSE-NACL 5-0.45 % IV SOLN
INTRAVENOUS | Status: DC
  Administered 2014-02-12 – 2014-02-14 (×4): via INTRAVENOUS

## 2014-02-12 NOTE — ED Notes (Signed)
Critical Lab results was given to [place].

## 2014-02-12 NOTE — ED Notes (Signed)
Called floor Nurse assisting another patient.

## 2014-02-12 NOTE — Progress Notes (Signed)
Unit CM UR Completed by [facility] ED CM  W. Modesty Rudy RN  

## 2014-02-12 NOTE — ED Notes (Signed)
Pt stated she is unable to urinate at this time.

## 2014-02-12 NOTE — H&P (Signed)
Date: 02/12/2014               Patient Name:  [name] MRN: [id number]  DOB: 02/26/1991 Age / Sex: 23 y.o., female   PCP: No primary provider on file.         Medical Service: Internal Medicine Teaching Service         Attending Physician: Dr. Madilyn Fireman, MD    First Contact: Dr. Trudee Kuster Pager: [phone]  Second Contact: Dr. Alice Rieger Pager: [phone]       After Hours (After 5p/  First Contact Pager: [phone]  weekends / holidays): Second Contact Pager: [phone]   Chief Complaint: Vomiting x2 days  History of Present Illness: [name] is a 23 year old female with uncontrolled DM1 presenting to the ED today with intermittent nausea and vomiting since Sunday night. She reports feeling well during the day but started vomiting at night that has persisted to today. Her sugars around the time her symptoms started were around 300. Her emesis has been mainly her food that she ate prior to getting sick and clear liquid, but this morning turned green. She also endorses associated abdominal pain, non-radiating to the back, intermittent, 6/10, improved with tylenol but still present, and DOE along with feeling like she will faint while while walking. She denies syncope, headache, chest pain, dysuria, but does have urinate frequently and is thirsty.  She does not recall the name of her PCP office but has not been for a while and is using the lantus prescription from last hospitalization. She has been seen by endocrinology at [facility] in the past and that is where she was diagnosed. She denies recent sick contacts or feeling sick but has some chills today and feels weak. She denies any tobacco or ilicit drug use but does occasionally drink alcohol, none recently however.  She does feel like her heart is racing but feels much better than when she first came to the ER at this time.   She is sexually active, uses condoms, has regular periods that started at the age of 68, last one around this  time last month, denies chance of being pregnant, no prior miscarriages or pregnancies and no hematemesis.   Meds: Current Facility-Administered Medications  Medication Dose Route Frequency Provider Last Rate Last Dose  . dextrose 5 %-0.45 % sodium chloride infusion   Intravenous Continuous Drucilla Schmidt, MD      . insulin regular (NOVOLIN R,HUMULIN R) 250 Units in sodium chloride 0.9 % 250 mL (1 Units/mL) infusion   Intravenous Continuous Drucilla Schmidt, MD      . ondansetron [facility]) injection 4 mg  4 mg Intravenous PRN Drucilla Schmidt, MD   4 mg at 02/12/14 0749  . sodium chloride 0.9 % bolus 1,000 mL  1,000 mL Intravenous Once Wilber Oliphant, MD       Current Outpatient Prescriptions  Medication Sig Dispense Refill  . ibuprofen (ADVIL,MOTRIN) 200 MG tablet Take 400 mg by mouth as needed for headache or cramping.       . insulin aspart (NOVOLOG FLEXPEN) 100 UNIT/ML FlexPen Inject 6 Units into the skin 3 (three) times daily with meals.  15 mL  11  . Insulin Detemir (LEVEMIR FLEXPEN) 100 UNIT/ML Pen Inject 28 Units into the skin daily at 10 pm.      . Insulin Pen Needle 31G X 6 MM MISC 28 Units by Does not apply route at bedtime.  100 each  0   Allergies: Allergies  as of 02/12/2014  . (No Known Allergies)   Past Medical History  Diagnosis Date  . Diabetes mellitus type 1     2005 dx [facility]   Past Surgical History  Procedure Laterality Date  . Cataract extraction      6 years ago   Family History  Problem Relation Age of Onset  . Diabetes Maternal Grandmother   . Hypertension Mother   . Stroke Maternal Grandmother   . Cancer      aunt, passed last month, ?RCC   History   Social History  . Marital Status: Single    Spouse Name: N/A    Number of Children: N/A  . Years of Education: N/A   Occupational History  . student     business administration   Social History Main Topics  . Smoking status: Never Smoker   . Smokeless tobacco: Not on file  . Alcohol Use:  Yes     Comment: occasional  . Drug Use: No  . Sexual Activity: Yes    Partners: Male    Birth Control/ Protection: Condom     Comment: last encounter 4 months ago   Other Topics Concern  . Not on file   Social History Narrative  . No narrative on file   Review of Systems:  Constitutional:  Chills  HEENT:  Denies congestion, sore throat  Respiratory:  DOE  Cardiovascular:  Denies chest pain and leg swelling. Feels like heart is racing  Gastrointestinal:  Nausea, vomiting, and abdominal pain. Denies diarrhea.   Genitourinary:  Frequency but denies dysuria or hematuria  Musculoskeletal:  Leg pain b/l  Skin:  Denies pallor, rash and wound.   Neurological:  Lightheadedness and dizziness, loss of vision before she feels like she will faint briefly that resolves when she sits down   Physical Exam: Blood pressure 121/72, pulse 122, temperature 97.5 F (36.4 C), temperature source Oral, resp. rate 16, SpO2 100.00%. Vitals reviewed. General: resting in bed, NAD HEENT: pupils reactive to light, EOMI, +thrush Cardiac: tachycardia Pulm: clear to auscultation bilaterally Abd: soft, diffuse tenderness to light palpation, nondistended, BS present Ext: warm and well perfused, no pedal edema or  Tenderness to palpation, moving all 4 extremities Neuro: alert and oriented X3  Lab results: Basic Metabolic Panel:  Recent Labs  02/12/14 0732  NA 129*  K 4.9  CL 88*  CO2 11*  GLUCOSE 618*  BUN 34*  CREATININE 1.96*  CALCIUM 10.3   CBC:  Recent Labs  02/12/14 0732  WBC 5.5  HGB 13.4  HCT 40.7  MCV 82.6  PLT 407*   CBG:  Recent Labs  02/12/14 0727  GLUCAP 527*   Urine Drug Screen: Drugs of Abuse     Component Value Date/Time   LABOPIA NONE DETECTED 12/05/2013 2007   COCAINSCRNUR NONE DETECTED 12/05/2013 2007   LABBENZ NONE DETECTED 12/05/2013 2007   AMPHETMU NONE DETECTED 12/05/2013 2007   THCU NONE DETECTED 12/05/2013 2007   LABBARB NONE DETECTED 12/05/2013 2007      Imaging results:  CXR pending  Other results: EKG: Sinus tachycardia, HR 122, qtc 466, non-specific t wave changes   Assessment & Plan by Problem: Active Problems:   Acute on chronic renal failure   Type II or unspecified type diabetes mellitus with renal manifestations, uncontrolled   DKA, type 1   Tachycardia   DOE (dyspnea on exertion)   Abdominal pain  [name] is a 24 year old female with uncontrolled DM1 admitted  for DKA and acute on chronic renal failure.   DKA in uncontrolled type 1 DM with acute on chronic renal failure--AG 30 on admission with Bicarb of 11. VBG 7.2/28.02/17/12. Endorses intermittent nausea, vomiting, abdominal pain, polyuria, and polydipsia. Does admit to non-compliance with insulin in the recent past which is likely cause along with possible incorrect dosing of insulin as she is not regularly followed up by a pcp or endocrinologist. Of note, she does endorse hypoglycemia in the middle of the night with current dose of Lantus 28 units at 10pm every night and novolog 6 units with meals. Last HbA1C 17 12/06/13. Ultrasound on last admission in June showed R kidney 12.8cm with probable extra-renal pelvis and L kidney 11.9cm.  -admit to SDU -already received 2L NS bolus, will start 3rd liter NS and start insulin gtt protocol -bmet's q2h, start with cmet now, replace electrolytes as needed -cbg monitoring -transition to [place] insulin when AG closed -u/a, urine preg -serum osm, ketones, lactic acid, ethanol, salicylate, acetaminophen pending; calculate osmolar gap given renal failure and very high AG. -HIV -check mag and phos -add Kcl 80meq once insulin gtt started for now -urine Na and Cr -NPO, can tolerate ice chips -consider nystatin swish and swallow for thrush when tolerating po  Abdominal pain--likely in setting of DKA and vomiting. Acute on chronic pancreatitis in setting of uncontrolled DM is also included in the differential. Passing gas and last BM  over the weekend (?saturday) making SBO less likely. Denies being pregnant. Denies any dysuria or abnormal discharge making UTI or pelvic infections possibly less likely, however, she does wish to be screened. Will check lactic acid incase of mesenteric ischemia but lower suspicion at this time.  -check lipase (hx of severely elevated lipase in the past (June 1591-->456).  -NPO and IVF -pregnancy testing pending -checking HIV -will add gonorrhea and chlamydia urine probe -defer pelvic exam/pap-smear to primary team -serial abdominal exams -prn pain control, received some relief with tylenol, will allow day team to reassess and adjust pain medications accordingly  DOE--reports over the past week. Possible source of DKA if underlying infection, however no cough, fever, or leukocytosis at this time. She does endorse some leg soreness but no visible swelling or tenderness to palpation on exam and is tachycardic, thus PE could be in differential and will be intermediate probability based on geneva score due to HR, thus should be considered in the differential but less likely at this time.  However, if the dyspnea gets worse, then certainly will warrant further evaluation.  -start with CXR   Tachycardia--likely in setting of dehydration and DKA. Sinus tachycardia on EKG with non-specific t wave changes.  -AM EKG -trop x1  Diet: NPO, ice chips okay DVT Ppx: Heparin Dispo: Disposition is deferred at this time, awaiting improvement of current medical problems. Anticipated discharge in approximately 1-2 day(s).   The patient does have a current PCP (dpes not recall name) and does not need an [facility] hospital follow-up appointment after discharge.  The patient does not have transportation limitations that hinder transportation to clinic appointments.  Signed: Wilber Oliphant, MD 02/12/2014, 9:57 AM

## 2014-02-12 NOTE — ED Notes (Signed)
Admit Doctors at bedside. 

## 2014-02-12 NOTE — ED Notes (Signed)
Pt c/o N/V and possible hyperglycemia; pt with hx of DM and DKA

## 2014-02-12 NOTE — ED Provider Notes (Signed)
CSN: [id number]     Arrival date & time 02/12/14  0714 History   First MD Initiated Contact with Patient 02/12/14 0720     Chief Complaint  Patient presents with  . 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 The vomiting woke her from sleep on Sunday night. She has not been able to eat much since that time; she has continued to take her nightly lantus, but has skipped her pre-meal novolog since that time. She has been having some lower abdominal pain and cramping. She denies sick contacts, recent travel, changes in bowel movements.   Of note, her insulin regimen changed about 2 months ago; she states that her FS have been better since that time.  Past Medical History  Diagnosis Date  . Diabetes mellitus    Past Surgical History  Procedure Laterality Date  . Cataract extraction     History reviewed. No pertinent family history. History  Substance Use Topics  . Smoking status: Never Smoker   . Smokeless tobacco: Not on file  . Alcohol Use: Yes     Comment: occasional   OB History   Grav Para Term Preterm Abortions TAB SAB Ect Mult Living                 Review of Systems General: no recent illness Skin: no rashes or lesions HEENT: no headaches, cataract surgery in both eyes several years ago, no recent vision changes Cardiac: no chest pain, no palpitations Respiratory: no shortness of breath GI: see HPI, lower abdominal pain, vomiting, no changes in BMs Urinary: no dysuria or hematuria Msk: no joint or muscular pain Endocrine: last A1c 17% in June, 2015, no temperature intolerance, no weight change Psychiatric: about to start senior year at [employer], unclear whether this is a major stressor   Allergies  Review of patient's allergies indicates no known allergies.  Home Medications   Prior to Admission medications   Medication Sig Start Date End Date Taking? Authorizing Provider  ibuprofen (ADVIL,MOTRIN) 200 MG  tablet Take 400 mg by mouth as needed.      Historical Provider, MD  insulin aspart (NOVOLOG FLEXPEN) 100 UNIT/ML FlexPen Inject 6 Units into the skin 3 (three) times daily with meals. 12/09/13   Allie Bossier, MD  Insulin Glargine (LANTUS SOLOSTAR) 100 UNIT/ML Solostar Pen Inject 28 Units into the skin daily at 10 pm. 12/09/13   Allie Bossier, MD  Insulin Pen Needle 31G X 6 MM MISC 28 Units by Does not apply route at bedtime. 12/09/13   Allie Bossier, MD   BP 111/81  Pulse 128  Temp(Src) 97.5 F (36.4 C) (Oral)  Resp 20  SpO2 100% Physical Exam Appearance: in NAD, sitting up in bed with cousins at bedside HEENT: PERRL, EOMi, lens opacification OS Heart: tachycardic, NSR, no MRG Lungs: CTAB Abdomen: BS+, thin abdomen, diffusely tender to palpation  Musculoskeletal: no joint swelling Extremities: no edema Neurologic: A&Ox3, grossly intact Skin: no changes, no acanthosis nigricans   ED Course  Procedures (including critical care time) Labs Review Labs Reviewed  CBG MONITORING, ED - Abnormal; Notable for the following:    Glucose-Capillary 527 (*)    All other components within normal limits    Imaging Review No results found.   EKG Interpretation None      MDM   Final diagnoses:  None    Ms. Vanderkolk is a 23 yo  woman with type 1 DM who is presenting in DKA. Her finger stick was 527, anion gap was 30 and her venous blood gas showed 7.26 / 28.8 / 29.0 / 13.0. She was admitted to inpatient medicine for resolution of her DKA.    Drucilla Schmidt, MD 02/15/14 [phone]

## 2014-02-12 NOTE — ED Notes (Signed)
CBG 230 

## 2014-02-12 NOTE — ED Notes (Signed)
Pain general lower middle abdomen 6/10 achy.

## 2014-02-12 NOTE — ED Notes (Signed)
Admitting Doctor at bedside 

## 2014-02-12 NOTE — Discharge Planning (Signed)
[place] [facility] [place] to patient regarding primary care resources and the importance of establishing care with a provider. Patient states she has coventry but coverage is coming up inactive. This liaison informed patient to call insurance carrier to find out about current benefits. Patient was given the orange card application and resource guide. Patient was instructed to contact me with assistance in finding a provider once aware of coverage. My contact information provided for any future questions or concerns. No other Community Liaison needs identified at this time.

## 2014-02-12 NOTE — ED Notes (Signed)
Per admitting Dr. Alice Rieger increase NS fluids to 200 ml per hour for 8hr and monitor I/O strictly.

## 2014-02-13 DIAGNOSIS — R109 Unspecified abdominal pain: Secondary | ICD-10-CM

## 2014-02-13 LAB — GC/CHLAMYDIA PROBE AMP
CT Probe RNA: NEGATIVE
GC PROBE AMP APTIMA: NEGATIVE

## 2014-02-13 LAB — BASIC METABOLIC PANEL
ANION GAP: 13 (ref 5–15)
ANION GAP: 16 — AB (ref 5–15)
ANION GAP: 13 (ref 5–15)
ANION GAP: 13 (ref 5–15)
Anion gap: 13 (ref 5–15)
Anion gap: 17 — ABNORMAL HIGH (ref 5–15)
Anion gap: 14 (ref 5–15)
BUN: 17 mg/dL (ref 6–23)
BUN: 15 mg/dL (ref 6–23)
BUN: 13 mg/dL (ref 6–23)
BUN: 12 mg/dL (ref 6–23)
BUN: 11 mg/dL (ref 6–23)
BUN: 10 mg/dL (ref 6–23)
BUN: 9 mg/dL (ref 6–23)
CALCIUM: 8.9 mg/dL (ref 8.4–10.5)
CALCIUM: 9.1 mg/dL (ref 8.4–10.5)
CALCIUM: 9.3 mg/dL (ref 8.4–10.5)
CALCIUM: 8.8 mg/dL (ref 8.4–10.5)
CHLORIDE: 109 meq/L (ref 96–112)
CHLORIDE: 112 meq/L (ref 96–112)
CHLORIDE: 113 meq/L — AB (ref 96–112)
CO2: 17 mEq/L — ABNORMAL LOW (ref 19–32)
CO2: 14 mEq/L — ABNORMAL LOW (ref 19–32)
CO2: 18 mEq/L — ABNORMAL LOW (ref 19–32)
CO2: 17 mEq/L — ABNORMAL LOW (ref 19–32)
CO2: 14 mEq/L — ABNORMAL LOW (ref 19–32)
CO2: 17 mEq/L — ABNORMAL LOW (ref 19–32)
CO2: 17 mEq/L — ABNORMAL LOW (ref 19–32)
CREATININE: 1.28 mg/dL — AB (ref 0.50–1.10)
CREATININE: 1.23 mg/dL — AB (ref 0.50–1.10)
CREATININE: 1.22 mg/dL — AB (ref 0.50–1.10)
CREATININE: 1.24 mg/dL — AB (ref 0.50–1.10)
CREATININE: 1.22 mg/dL — AB (ref 0.50–1.10)
Calcium: 8.6 mg/dL (ref 8.4–10.5)
Calcium: 8.9 mg/dL (ref 8.4–10.5)
Calcium: 9 mg/dL (ref 8.4–10.5)
Chloride: 112 mEq/L (ref 96–112)
Chloride: 109 mEq/L (ref 96–112)
Chloride: 111 mEq/L (ref 96–112)
Chloride: 110 mEq/L (ref 96–112)
Creatinine, Ser: 1.27 mg/dL — ABNORMAL HIGH (ref 0.50–1.10)
Creatinine, Ser: 1.2 mg/dL — ABNORMAL HIGH (ref 0.50–1.10)
GFR calc Af Amer: 69 mL/min — ABNORMAL LOW (ref >90)
GFR calc Af Amer: 68 mL/min — ABNORMAL LOW (ref >90)
GFR calc Af Amer: 72 mL/min — ABNORMAL LOW (ref >90)
GFR calc Af Amer: 72 mL/min — ABNORMAL LOW (ref >90)
GFR calc Af Amer: 74 mL/min — ABNORMAL LOW (ref >90)
GFR calc non Af Amer: 59 mL/min — ABNORMAL LOW (ref >90)
GFR calc non Af Amer: 61 mL/min — ABNORMAL LOW (ref >90)
GFR calc non Af Amer: 62 mL/min — ABNORMAL LOW (ref >90)
GFR, EST AFRICAN AMERICAN: 72 mL/min — AB (ref >90)
GFR, EST AFRICAN AMERICAN: 71 mL/min — AB (ref >90)
GFR, EST NON AFRICAN AMERICAN: 59 mL/min — AB (ref >90)
GFR, EST NON AFRICAN AMERICAN: 62 mL/min — AB (ref >90)
GFR, EST NON AFRICAN AMERICAN: 62 mL/min — AB (ref >90)
GFR, EST NON AFRICAN AMERICAN: 64 mL/min — AB (ref >90)
GLUCOSE: 140 mg/dL — AB (ref 70–99)
Glucose, Bld: 219 mg/dL — ABNORMAL HIGH (ref 70–99)
Glucose, Bld: 124 mg/dL — ABNORMAL HIGH (ref 70–99)
Glucose, Bld: 122 mg/dL — ABNORMAL HIGH (ref 70–99)
Glucose, Bld: 229 mg/dL — ABNORMAL HIGH (ref 70–99)
Glucose, Bld: 177 mg/dL — ABNORMAL HIGH (ref 70–99)
Glucose, Bld: 119 mg/dL — ABNORMAL HIGH (ref 70–99)
POTASSIUM: 3.7 meq/L (ref 3.7–5.3)
POTASSIUM: 4 meq/L (ref 3.7–5.3)
POTASSIUM: 3.7 meq/L (ref 3.7–5.3)
Potassium: 3.5 mEq/L — ABNORMAL LOW (ref 3.7–5.3)
Potassium: 3.8 mEq/L (ref 3.7–5.3)
Potassium: 4.3 mEq/L (ref 3.7–5.3)
Potassium: 3.9 mEq/L (ref 3.7–5.3)
SODIUM: 142 meq/L (ref 137–147)
SODIUM: 141 meq/L (ref 137–147)
Sodium: 139 mEq/L (ref 137–147)
Sodium: 143 mEq/L (ref 137–147)
Sodium: 143 mEq/L (ref 137–147)
Sodium: 140 mEq/L (ref 137–147)
Sodium: 141 mEq/L (ref 137–147)

## 2014-02-13 LAB — GLUCOSE, CAPILLARY
GLUCOSE-CAPILLARY: 115 mg/dL — AB (ref 70–99)
GLUCOSE-CAPILLARY: 119 mg/dL — AB (ref 70–99)
GLUCOSE-CAPILLARY: 137 mg/dL — AB (ref 70–99)
GLUCOSE-CAPILLARY: 145 mg/dL — AB (ref 70–99)
GLUCOSE-CAPILLARY: 161 mg/dL — AB (ref 70–99)
GLUCOSE-CAPILLARY: 181 mg/dL — AB (ref 70–99)
GLUCOSE-CAPILLARY: 182 mg/dL — AB (ref 70–99)
GLUCOSE-CAPILLARY: 186 mg/dL — AB (ref 70–99)
GLUCOSE-CAPILLARY: 192 mg/dL — AB (ref 70–99)
GLUCOSE-CAPILLARY: 195 mg/dL — AB (ref 70–99)
Glucose-Capillary: 115 mg/dL — ABNORMAL HIGH (ref 70–99)
Glucose-Capillary: 125 mg/dL — ABNORMAL HIGH (ref 70–99)
Glucose-Capillary: 125 mg/dL — ABNORMAL HIGH (ref 70–99)
Glucose-Capillary: 129 mg/dL — ABNORMAL HIGH (ref 70–99)
Glucose-Capillary: 130 mg/dL — ABNORMAL HIGH (ref 70–99)
Glucose-Capillary: 137 mg/dL — ABNORMAL HIGH (ref 70–99)
Glucose-Capillary: 139 mg/dL — ABNORMAL HIGH (ref 70–99)
Glucose-Capillary: 143 mg/dL — ABNORMAL HIGH (ref 70–99)
Glucose-Capillary: 153 mg/dL — ABNORMAL HIGH (ref 70–99)
Glucose-Capillary: 157 mg/dL — ABNORMAL HIGH (ref 70–99)
Glucose-Capillary: 177 mg/dL — ABNORMAL HIGH (ref 70–99)
Glucose-Capillary: 177 mg/dL — ABNORMAL HIGH (ref 70–99)
Glucose-Capillary: 188 mg/dL — ABNORMAL HIGH (ref 70–99)
Glucose-Capillary: 194 mg/dL — ABNORMAL HIGH (ref 70–99)

## 2014-02-13 LAB — CBC
HEMATOCRIT: 29.3 % — AB (ref 36.0–46.0)
Hemoglobin: 9.6 g/dL — ABNORMAL LOW (ref 12.0–15.0)
MCH: 26.4 pg (ref 26.0–34.0)
MCHC: 32.8 g/dL (ref 30.0–36.0)
MCV: 80.7 fL (ref 78.0–100.0)
PLATELETS: 306 10*3/uL (ref 150–400)
RBC: 3.63 MIL/uL — ABNORMAL LOW (ref 3.87–5.11)
RDW: 14.2 % (ref 11.5–15.5)
WBC: 6 10*3/uL (ref 4.0–10.5)

## 2014-02-13 MED ORDER — MORPHINE SULFATE 2 MG/ML IJ SOLN
1.0000 mg | Freq: Four times a day (QID) | INTRAMUSCULAR | Status: DC | PRN
  Administered 2014-02-13 – 2014-02-14 (×3): 1 mg via INTRAVENOUS
  Filled 2014-02-13 (×3): qty 1

## 2014-02-13 MED ORDER — POTASSIUM CHLORIDE 10 MEQ/100ML IV SOLN: 10.0000 meq | INTRAVENOUS | Status: DC

## 2014-02-13 MED ORDER — POTASSIUM CHLORIDE 10 MEQ/100ML IV SOLN
10.0000 meq | INTRAVENOUS | Status: AC
  Administered 2014-02-13: 10 meq via INTRAVENOUS
  Filled 2014-02-13 (×2): qty 100

## 2014-02-13 MED ORDER — WHITE PETROLATUM GEL
Status: AC
  Administered 2014-02-13: 18:00:00
  Filled 2014-02-13: qty 5

## 2014-02-13 MED ORDER — POTASSIUM CHLORIDE 10 MEQ/100ML IV SOLN
10.0000 meq | INTRAVENOUS | Status: AC
  Administered 2014-02-13 (×2): 10 meq via INTRAVENOUS
  Filled 2014-02-13: qty 100

## 2014-02-13 NOTE — Progress Notes (Addendum)
Inpatient Diabetes Program Recommendations  AACE/ADA: New Consensus Statement on Inpatient Glycemic Control (2013)  Target Ranges:  Prepandial:   less than 140 mg/dL      Peak postprandial:   less than 180 mg/dL (1-2 hours)      Critically ill patients:  140 - 180 mg/dL   Reason for Assessment:  Patient admitted with DKA.  Note that she was here in June 2015 and A1C was 17.0%.  Patient states that her CBG's had been better since starting Levemir/ Novolog regimen which was started during last admission.  She states that she ate something that did not agree with her and therefore began having abdominal pain/nausea and vomiting.   She states that she does have insurance but that her co-pay has been $400 for her insulin.  Based on note from the ED, there is questions regarding whether the patient has insurance coverage.  She states that she did see a Dr. In July at "the clinic".  She states that she plans to follow-up at the internal medicine clinic.  It would be helpful to know her A1C to determine how her new basal/bolus regimen has been working.  She likely still needs adjustments and possibly the addition of a Novolog correction scale.  Also she would benefit from outpatient follow-up with CDE for further diabetes education.   Consider Levemir 20 units 2 hours prior to discontinuation of insulin drip (once AG closed).  Also consider Novolog sensitive correction q 4 hours and Novolog meal coverage 4 units tid with meals (Hold if patient eats less than 50%). Will follow.  Thanks, Adah Perl, RN, BC-ADM Inpatient Diabetes Coordinator Pager [phone]   8/27- Correction: Note that patient was taking Lantus prior to admit and not Levemir.

## 2014-02-13 NOTE — Progress Notes (Signed)
Subjective:    Currently, the patient reports feeling significantly better with resolution of her abdominal pain and nausea.  She is no longer feeling lightheaded or dizzy and is starting to get hungry.  She reports that her heart rate is consistently elevated in the 100s at baseline.  She denies chest pain, SOB, or headache.  Interval Events: -AG 13 early this morning and glucose was 137, so insulin drip was turned off by nursing staff. -At 6 am, AG 16 with bicarb of 14 and insulin drip resumed. -On D5 1/2 NS at 100 cc/h -Repeat EKG unchanged from prior.   Objective:    Vital Signs:   Temp:  [98 F (36.7 C)-98.7 F (37.1 C)] 98 F (36.7 C) (08/26 0332) Pulse Rate:  [108-125] 110 (08/26 0900) Resp:  [10-23] 13 (08/26 0900) BP: (107-130)/(63-86) 119/82 mmHg (08/26 0843) SpO2:  [98 %-100 %] 100 % (08/26 0900) Weight:  [110 lb 10.7 oz (50.2 kg)] 110 lb 10.7 oz (50.2 kg) (08/25 1730)    24-hour weight change: Weight change:   Intake/Output:   Intake/Output Summary (Last 24 hours) at 02/13/14 1033 Last data filed at 02/13/14 0830  Gross per 24 hour  Intake 1460.33 ml  Output   1350 ml  Net 110.33 ml      Physical Exam: General: Vital signs reviewed and noted. Well-developed, well-nourished, in no acute distress; alert, appropriate and cooperative throughout examination.  Lungs:  Normal respiratory effort. Clear to auscultation BL without crackles or wheezes.  Heart: Tachycardic. S1 and S2 normal without gallop, murmur, or rubs.  Abdomen:  BS normoactive. Soft, Nondistended, non-tender.  No masses or organomegaly.  Extremities: No pretibial edema.     Labs:  Basic Metabolic Panel:  Recent Labs Lab 02/12/14 0732 02/12/14 0910  02/12/14 1939 02/12/14 2130 02/12/14 2319 02/13/14 0235 02/13/14 0610  NA 129*  --   < > 143 139 142 142 139  K 4.9  --   < > 4.7 4.4 4.2 3.7 4.0  CL 88*  --   < > 112 110 112 112 109  CO2 11*  --   < > 15* 17* 14* 17* 14*  GLUCOSE  618*  --   < > 192* 147* 168* 140* 219*  BUN 34*  --   < > 21 20 19 17 15   CREATININE 1.96*  --   < > 1.36* 1.32* 1.31* 1.27* 1.28*  CALCIUM 10.3  --   < > 8.7 8.3* 8.7 8.6 8.9  MG  --  2.1  --   --   --   --   --   --   PHOS  --  4.3  --   --   --   --   --   --   < > = values in this interval not displayed.  Liver Function Tests:  Recent Labs Lab 02/12/14 0939  AST 7  ALT 6  ALKPHOS 114  BILITOT 0.2*  PROT 7.7  ALBUMIN 3.3*    Recent Labs Lab 02/12/14 0910  LIPASE 33   CBC:  Recent Labs Lab 02/12/14 0732 02/13/14 0109  WBC 5.5 6.0  HGB 13.4 9.6*  HCT 40.7 29.3*  MCV 82.6 80.7  PLT 407* 306    Cardiac Enzymes:  Recent Labs Lab 02/12/14 0939  TROPONINI <0.30   CBG:  Recent Labs Lab 02/13/14 0533 02/13/14 0628 02/13/14 0744 02/13/14 0834 02/13/14 0941  GLUCAP 177* 182* 195* 177* 129*    Microbiology: Results  for orders placed during the hospital encounter of 02/12/14  GC/CHLAMYDIA PROBE AMP     Status: None   Collection Time    02/12/14 10:05 AM      Result Value Ref Range Status   CT Probe RNA NEGATIVE  NEGATIVE Final   GC Probe RNA NEGATIVE  NEGATIVE Final   Comment: (NOTE)                                                                                               **Normal Reference Range: Negative**          Assay performed using the Gen-Probe APTIMA COMBO2 (R) Assay.     Acceptable specimen types for this assay include APTIMA Swabs (Unisex,     endocervical, urethral, or vaginal), first void urine, and ThinPrep     liquid based cytology samples.     Performed at [facility] PCR SCREENING     Status: None   Collection Time    02/12/14  5:31 PM      Result Value Ref Range Status   MRSA by PCR NEGATIVE  NEGATIVE Final   Comment:            The GeneXpert MRSA Assay (FDA     approved for NASAL specimens     only), is one component of a     comprehensive MRSA colonization     surveillance program. It is not      intended to diagnose MRSA     infection nor to guide or     monitor treatment for     MRSA infections.   Other Labs: HIV unreactive, lactate, ASA, acetaminophen negative.  Other results: EKG: unchanged from previous tracings, sinus tachycardia, non-specific TWI in III, V1, V2.  Imaging: Dg Chest 2 View  02/12/2014   CLINICAL DATA:  Difficulty breathing  EXAM: CHEST  2 VIEW  COMPARISON:  None.  FINDINGS: Lungs are clear. Heart size and pulmonary vascularity are normal. No adenopathy. No adenopathy. There is mild lower thoracic dextroscoliosis.  IMPRESSION: No edema or consolidation.   Electronically Signed   By: Lowella Grip M.D.   On: 02/12/2014 10:42     Medications:    Infusions: . sodium chloride    . dextrose 5 % and 0.45% NaCl Stopped (02/12/14 1846)  . dextrose 5 % and 0.45% NaCl 100 mL/hr at 02/13/14 0833  . insulin (NOVOLIN-R) infusion 2.9 Units/hr (02/13/14 0830)    Scheduled Medications: . heparin  5,000 Units Subcutaneous 3 times per day  . insulin regular  0-10 Units Intravenous TID WC  . potassium chloride  10 mEq Intravenous Q1 Hr x 2  . sodium chloride  1,000 mL Intravenous Once  . sodium chloride  3 mL Intravenous Q12H    PRN Medications: acetaminophen, acetaminophen, dextrose, morphine injection, ondansetron (ZOFRAN) IV   Assessment/ Plan:    Principal Problem:   DKA, type 1 Active Problems:   Acute on chronic renal failure   Type II or unspecified type diabetes mellitus with renal manifestations, uncontrolled   Tachycardia   DOE (dyspnea on exertion)  Abdominal pain   DKA, type 1, not at goal  #Diabetic ketoacidosis in uncontrolled DM1 Anion gap closing, but still elevated at 16 and acidotic with CO2 of 14.  This will take awhile given her large gap and acidosis on presentation, but she is improving today.  The cause is likely med non-compliance because she reports missing her meal time insulin. She does associate her nausea and vomiting with  a frozen dinner, so food poisoning could have contributed as well.  Also reports hypoglycemia during the night, which is symptomatic.  The majority of her insulin is being administered as Lantus 28 units, so could decrease Lantus and increase meal-time insulin (only getting 6 units with meals = 18).  This will increase her compliance because she will have fewer hypoglycemic episodes.  She will need close outpatient follow up and to be set up with an endocrinologist. K 4.0 this morning, will supplement. -NPO. -Continue insulin gtt until bicarb improves and gap closes. -Will start Lantus at reduced dose at that time (~23 units) and resume diet. -Continue D5 1/2 NS at 100 ml/hr until eating. -BMP q2h. -CBG hourly. -20 mEq K IV supplementation (2 10 mEq in 100 ml).  #Acute on chronic renal failure Baseline creatinine 1.1, up to 1.96 on admission.  Chronic renal failure due to diabetic nephropathy with acute failure due to dehydration from nausea and vomiting plus DKA. Creatinine improved to 1.28 today. -Continue to rehydrate as above. -Monitor BMP.  #Tachycardia  Reports baseline HR in the 100s. EKG with non-specific T wave inversion, unchanged today.  Dizziness, pre-syncope, and DOE has resolved, and was likely related to dehydration from vomiting. -Orthostatics this morning. -Continue to rehydrate as above.  #Abdominal pain Now resolved.  Likely was from DKA with possible contribution of food poisoning.  No vaginal discharge or complaints currently, so no need for pelvic exam.  GC/CT RNA probes negative.  No nausea or vomiting currently. -No further work-up unless symptoms return. -Zofran 4 mg q4h PRN. -Morphine 1 mg q6h PRN.  #White tongue Likely related to recent vomiting, does not scrape off tongue.  Unlikely to be thrush since patient is not immunocompromised. -No treatment required.    DVT PPX - heparin  CODE STATUS - Full code.  CONSULTS PLACED - None.  DISPO - Disposition is  deferred at this time, awaiting improvement of DKA.   Anticipated discharge in approximately 1-2 day(s).   The patient does not have a current PCP (No primary provider on file.) and does need an [facility] hospital follow-up appointment after discharge.    Is the [facility] hospital follow-up appointment a one-time only appointment? yes.  Does the patient have transportation limitations that hinder transportation to clinic appointments? yes   SERVICE NEEDED AT [place]         Y = Yes, Blank = No PT:   OT:   RN:   Equipment:   Other:      Length of Stay: 1 day(s)   Signed: Arman Filter, MD  PGY-1, Internal Medicine Resident Pager: [phone] (7AM-5PM) 02/13/2014, 10:33 AM

## 2014-02-13 NOTE — Progress Notes (Signed)
I have reviewed and discussed the plan with Darcus Austin, MS3.  Please see my separate note.  Arman Filter, MD, PhD Internal Medicine Intern Pager: [phone] 02/13/2014,2:46 PM

## 2014-02-13 NOTE — H&P (Signed)
INTERNAL MEDICINE TEACHING ATTENDING NOTE  Day 1 of stay  Patient name: [name]  MRN: [id number] Date of birth: 1990/08/05   Key clinical points and exam                                                           23 y.o.with DKA secondary to non-compliance with correct dosing of her insulin. I have read the HPI by Dr Eula Fried and I concur with her findings. Overnight, most of her blood work has trended favorably. She reports feeling better, has no nausea or vomiting or abdominal pain. She does not complain of shortness of breath. She does not complain of dizziness while sitting or standing up. Vitals stable.   General: Resting in bed. HEENT: PERRL, EOMI, no scleral icterus. Heart: RRR, no rubs, murmurs or gallops. Lungs: Clear to auscultation bilaterally, no wheezes, rales, or rhonchi. Abdomen: Soft, nontender, nondistended, BS present. Extremities: Warm, no pedal edema. Neuro: Alert and oriented X3, grossly intact.    I have reviewed the chart, lab results, EKG, imaging and relevant notes of this patient.   Assessment and Plan                                                                       DKA - gap not closed, still acidotic. Continue insulin drip. Overlap with subcu and begin trial of food when CO2>18, gap <12.  Acute on CKD - hydrate, continue to monitor.   Dyspnea - No objective evidence, patient without oxygen and comfortable. EKG no evidence of acute anomaly. No signs of CHF. CXR clear. Would consider the temporary symptoms likely due to vomiting and overall exhaustion.   I have seen and evaluated this patient and discussed it with my IM resident team.  Please see the rest of the plan per resident note from today.   [place], [place] 02/13/2014, 12:28 PM.

## 2014-02-13 NOTE — Progress Notes (Signed)
Subjective: [name] is a 23 YO woman with a history of DM1 who presented to the ED yesterday with a 2 day history of nausea, vomiting, dizziness, and abdominal pain. She was found to be hyperglycemic with a blood glucose of 618 and an anion gap of 30. She was admitted for DKA. This morning, she reports feeling much better. Her nausea, vomiting, dizziness, and abdominal pain have completely resolved. She does complain of BL painful tingling starting in her toes and extending to the mid shin. This began in June after she was switched to Lantus insulin therapy. She denies SOB, chest pain, and fever. She is ambulating to the bathroom and around the room.  Objective: Vital signs in last 24 hours: Filed Vitals:   02/13/14 0700 02/13/14 0800 02/13/14 0843 02/13/14 0900  BP:   119/82   Pulse: 118 109 108 110  Temp:      TempSrc:      Resp: 23 10 13 13   Height:      Weight:      SpO2: 98% 100% 100% 100%   Weight change:   Intake/Output Summary (Last 24 hours) at 02/13/14 1026 Last data filed at 02/13/14 0830  Gross per 24 hour  Intake 1460.33 ml  Output   1350 ml  Net 110.33 ml   BP 119/82  Pulse 110  Temp(Src) 98 F (36.7 C) (Oral)  Resp 13  Ht 5\' 1"  (1.549 m)  Wt 50.2 kg (110 lb 10.7 oz)  BMI 20.92 kg/m2  SpO2 100%  LMP 01/09/2014 General appearance: alert, cooperative and no distress Lungs: clear to auscultation bilaterally Heart: Tachycardic, NL S1/S2, no murmurs, rubs, or gallops Abdomen: soft, non-tender; bowel sounds normal; no masses,  no organomegaly Extremities: extremities normal, atraumatic, no cyanosis or edema Neurologic: Grossly normal with tingling present from distal foot to mid-shin. No loss of sensation.  Lab Results: Basic Metabolic Panel:  Recent Labs Lab 02/12/14 0732 02/12/14 0910  02/13/14 0235 02/13/14 0610  NA 129*  --   < > 142 139  K 4.9  --   < > 3.7 4.0  CL 88*  --   < > 112 109  CO2 11*  --   < > 17* 14*  GLUCOSE 618*  --   < >  140* 219*  BUN 34*  --   < > 17 15  CREATININE 1.96*  --   < > 1.27* 1.28*  CALCIUM 10.3  --   < > 8.6 8.9  MG  --  2.1  --   --   --   PHOS  --  4.3  --   --   --   < > = values in this interval not displayed. Liver Function Tests:  Recent Labs Lab 02/12/14 0939  AST 7  ALT 6  ALKPHOS 114  BILITOT 0.2*  PROT 7.7  ALBUMIN 3.3*    Recent Labs Lab 02/12/14 0910  LIPASE 33   CBC:  Recent Labs Lab 02/12/14 0732 02/13/14 0109  WBC 5.5 6.0  HGB 13.4 9.6*  HCT 40.7 29.3*  MCV 82.6 80.7  PLT 407* 306   CBG:  Recent Labs Lab 02/13/14 0431 02/13/14 0533 02/13/14 0628 02/13/14 0744 02/13/14 0834 02/13/14 0941  GLUCAP 139* 177* 182* 195* 177* 129*   Urine Drug Screen: Drugs of Abuse     Component Value Date/Time   LABOPIA NONE DETECTED 02/12/2014 1005   COCAINSCRNUR NONE DETECTED 02/12/2014 1005   LABBENZ NONE DETECTED 02/12/2014 1005  AMPHETMU NONE DETECTED 02/12/2014 1005   THCU NONE DETECTED 02/12/2014 1005   LABBARB NONE DETECTED 02/12/2014 1005    Alcohol Level:  Recent Labs Lab 02/12/14 0910  ETH <11   Urinalysis:  Recent Labs Lab 02/12/14 1005  COLORURINE COLORLESS*  LABSPEC 1.021  PHURINE 5.5  GLUCOSEU >1000*  HGBUR TRACE*  BILIRUBINUR NEGATIVE  KETONESUR >80*  PROTEINUR 100*  UROBILINOGEN 0.2  NITRITE NEGATIVE  LEUKOCYTESUR NEGATIVE   Misc. Labs: UPT negative  Micro Results: Recent Results (from the past 240 hour(s))  GC/CHLAMYDIA PROBE AMP     Status: None   Collection Time    02/12/14 10:05 AM      Result Value Ref Range Status   CT Probe RNA NEGATIVE  NEGATIVE Final   GC Probe RNA NEGATIVE  NEGATIVE Final   Comment: (NOTE)                                                                                               **Normal Reference Range: Negative**          Assay performed using the Gen-Probe APTIMA COMBO2 (R) Assay.     Acceptable specimen types for this assay include APTIMA Swabs (Unisex,     endocervical, urethral,  or vaginal), first void urine, and ThinPrep     liquid based cytology samples.     Performed at [place] PCR SCREENING     Status: None   Collection Time    02/12/14  5:31 PM      Result Value Ref Range Status   MRSA by PCR NEGATIVE  NEGATIVE Final   Comment:            The GeneXpert MRSA Assay (FDA     approved for NASAL specimens     only), is one component of a     comprehensive MRSA colonization     surveillance program. It is not     intended to diagnose MRSA     infection nor to guide or     monitor treatment for     MRSA infections.   Studies/Results: Dg Chest 2 View  02/12/2014   CLINICAL DATA:  Difficulty breathing  EXAM: CHEST  2 VIEW  COMPARISON:  None.  FINDINGS: Lungs are clear. Heart size and pulmonary vascularity are normal. No adenopathy. No adenopathy. There is mild lower thoracic dextroscoliosis.  IMPRESSION: No edema or consolidation.   Electronically Signed   By: Lowella Grip M.D.   On: 02/12/2014 10:42   Medications: I have reviewed the patient's current medications. Scheduled Meds: . heparin  5,000 Units Subcutaneous 3 times per day  . insulin regular  0-10 Units Intravenous TID WC  . potassium chloride  10 mEq Intravenous Q1 Hr x 2  . sodium chloride  1,000 mL Intravenous Once  . sodium chloride  3 mL Intravenous Q12H   Continuous Infusions: . sodium chloride    . dextrose 5 % and 0.45% NaCl Stopped (02/12/14 1846)  . dextrose 5 % and 0.45% NaCl 100 mL/hr at 02/13/14 0833  . insulin (NOVOLIN-R) infusion 2.9 Units/hr (02/13/14  0830)   PRN Meds:.acetaminophen, acetaminophen, dextrose, morphine injection, ondansetron (ZOFRAN) IV Assessment/Plan: Active Problems:   Acute on chronic renal failure   Type II or unspecified type diabetes mellitus with renal manifestations, uncontrolled   DKA, type 1   Tachycardia   DOE (dyspnea on exertion)   Abdominal pain   DKA, type 1, not at goal  DKA in uncontrolled type 1 DM with acute on  chronic renal failure--AG 30, bicarb of 11, VBG 7.2/28.02/17/12 on admission. Currently, patient's symptoms of nausea, vomiting, abdominal pain, and weakness have resolved. Most recent AG 16, bicarb of 14. She received 3L NS bolus, 1L NS at 200 mL/hr, and 1L D5 at 200 mL/hr yesterday. Currently, patient is on D5/NS 0.45 at 100 mL/hr and 250 units regular insulin in 0.9% NaCl continuous IV since her BG levels are less than 250. Most recent BG level is 129 at 0941. In the past 12 hours, patient's BG levels ranged from 125-195. AG remains elevated at 16 and bicarb is 14. Will continue to administer fluids and collect BMET q2h. Patient reports bilateral tingling and pain in her feet to her mid-calves. She says this pain began in June, around the time she was switched to Lantus. Likely diabetic neuropathy. Will recommend that the patient work on obtaining better glucose control prior to initiation of medication. If glucose control does not resolve the pain, consider starting amitriptyline.   Prior to presentation, patient endorses hypoglycemia in the middle of the night with current dose of Lantus 28 units at 10pm every night and novolog 6 units with meals. Last HbA1C 17 12/06/13. Will consult with diabetes educator while inpatient and look to schedule follow up with endocrine to better control her average glucose levels. -3rd liter NS and start insulin gtt protocol  -bmet's q2h, replace electrolytes as needed  -cbg monitoring  -transition to [place] insulin when AG closed  -calculate osmolar gap given renal failure and very high AG.  -Kcl 74meq added once insulin gtt started for now  -urine Na and Cr  -NPO, can tolerate ice chips until AG as closed -Consult diabetic coordinator  Abdominal pain-- completely resolved. Likely due to vomiting.  DOE-- resolved. CXR negative. Likely due to hypovolemic state.  Tachycardia--likely in setting of dehydration and DKA. Sinus tachycardia on EKG with non-specific t wave  changes. HR ~110's this morning. Patient reports that her baseline HR is tachycardic, normally averaging around 110 bpm. -repeat AM EKG  -orthostatics ordered  Diet: NPO, ice chips okay   DVT Ppx: Heparin 5000 U subcut q8h  Dispo: Disposition is deferred at this time, awaiting improvement of current medical problems. Anticipated discharge in approximately 1-2 day(s).   The patient does have a current PCP (dpes not recall name) and does not need an [facility] hospital follow-up appointment after discharge.   The patient does not have transportation limitations that hinder transportation to clinic appointments.  This is a Careers information officer Note.  The care of the patient was discussed with Dr. Trudee Kuster and the assessment and plan formulated with their assistance.  Please see their attached note for official documentation of the daily encounter.   LOS: 1 day   Darcus Austin, Med Student 02/13/2014, 10:26 AM

## 2014-02-13 NOTE — Care Management Note (Signed)
    Page 1 of 2   02/15/2014     11:41:24 AM CARE MANAGEMENT NOTE 02/15/2014  Patient:  [name], [name]   Account Number:  [id number]  Date Initiated:  02/13/2014  Documentation initiated by:  Marvetta Gibbons  Subjective/Objective Assessment:   Pt admitted with DKA     Action/Plan:   PTA pt lived at home alone   Anticipated DC Date:  02/15/2014   Anticipated DC Plan:  [place]  CM consult  Follow-up appt scheduled  [facility]      Choice offered to / List presented to:             Status of service:  Completed, signed off Medicare Important Message given?  NO (If response is "NO", the following Medicare IM given date fields will be blank) Date Medicare IM given:   Medicare IM given by:   Date Additional Medicare IM given:   Additional Medicare IM given by:    Discharge Disposition:    Per UR Regulation:  Reviewed for med. necessity/level of care/duration of stay  If discussed at [place] of Stay Meetings, dates discussed:    Comments:  Medicare IM- N/A  02/15/14- 73- Marvetta Gibbons RN, BSN [phone] Pt for d/c today, have arranged f/u with [facility]- for Tues. Sept. 1 at 9:00- pt given info on clinic and appointment along with info on dm test supplies that may be cheaper for her. Pt has vouchers for insulin- and will stay on same insulin that she has at home- so will not need MATCH assistance at discharge.  02/14/14- 1530- Marvetta Gibbons RN, BSN [phone] Pt's mother here at bedside- confirmed that pt does not have insurance coverage at this time- pt may need assistance with medications at discharge- will provide pt with [facility] letter if needed- NCM to continue to follow-   02/13/14- [place] RN, BSN [phone] referral received regarding insurance and insulin needs- pt was showing that she had [employer] primary with [employer] secondary- now not showing any coverage in epic- spoke with FC-Michele to verify  coverage- and per conversation pt's insurance shows inactive when ran for verification- primary inactive as of June/2015 and secondary inactive as of May/2015- spoke with pt to confirm with her insurance situation- pt's dad also at bedside per conversation with pt she states that her copay for insulin use to be less until this summer when her coverage stopped- she states that she was on her mothers insurance which her mom is changing- and she has been paying $400 for insulin- believe that this is due to pt having no coverage since May and she has been paying out of pocket cost for insulin- pt and her dad report that mom could have a new card for coverage- encourage them to check with mom to see and if so to bring new insurance card in so that we can verify coverage. If pt does indeed not have any insurance coverage then she could be eligible for [facility] program at discharge for medication assistance- plan is to f/u with the outpt  clinic- and she could apply for orange card through clinic-  per pt she has 2 boxes of both Lantus and Novolog insulin at home already along with syringes. NCM to continue to follow for d/c needs

## 2014-02-13 NOTE — Progress Notes (Signed)
Nutrition Brief Note  Patient identified on the Malnutrition Screening Tool (MST) Report for unintentional weight loss of ~10 lb. Patient reports that she has been eating adequately at home. Suspect weight loss related to uncontrolled blood glucose. Patient declined diet education, states that she has had diabetes for 10 years and has no questions regarding diet or CHO counting.  Wt Readings from Last 15 Encounters:  02/12/14 110 lb 10.7 oz (50.2 kg)  12/09/13 121 lb (54.885 kg)    Body mass index is 20.92 kg/(m^2). Patient meets criteria for normal weight based on current BMI.   Current diet order is NPO, insulin drip was just discontinued. Labs and medications reviewed.   No nutrition interventions warranted at this time. If nutrition issues arise, please consult RD.   Molli Barrows, RD, LDN, [place] Pager [phone] After Hours Pager [phone]

## 2014-02-14 DIAGNOSIS — E1149 Type 2 diabetes mellitus with other diabetic neurological complication: Secondary | ICD-10-CM

## 2014-02-14 DIAGNOSIS — E109 Type 1 diabetes mellitus without complications: Secondary | ICD-10-CM | POA: Insufficient documentation

## 2014-02-14 DIAGNOSIS — R110 Nausea: Secondary | ICD-10-CM

## 2014-02-14 DIAGNOSIS — E1140 Type 2 diabetes mellitus with diabetic neuropathy, unspecified: Secondary | ICD-10-CM

## 2014-02-14 DIAGNOSIS — E1142 Type 2 diabetes mellitus with diabetic polyneuropathy: Secondary | ICD-10-CM

## 2014-02-14 LAB — BASIC METABOLIC PANEL
ANION GAP: 12 (ref 5–15)
ANION GAP: 14 (ref 5–15)
ANION GAP: 14 (ref 5–15)
Anion gap: 16 — ABNORMAL HIGH (ref 5–15)
Anion gap: 10 (ref 5–15)
BUN: 7 mg/dL (ref 6–23)
BUN: 6 mg/dL (ref 6–23)
BUN: 7 mg/dL (ref 6–23)
BUN: 7 mg/dL (ref 6–23)
BUN: 6 mg/dL (ref 6–23)
CALCIUM: 9.1 mg/dL (ref 8.4–10.5)
CHLORIDE: 104 meq/L (ref 96–112)
CHLORIDE: 107 meq/L (ref 96–112)
CO2: 19 mEq/L (ref 19–32)
CO2: 17 mEq/L — ABNORMAL LOW (ref 19–32)
CO2: 17 mEq/L — ABNORMAL LOW (ref 19–32)
CO2: 17 mEq/L — ABNORMAL LOW (ref 19–32)
CO2: 20 meq/L (ref 19–32)
CREATININE: 1.12 mg/dL — AB (ref 0.50–1.10)
CREATININE: 1.08 mg/dL (ref 0.50–1.10)
Calcium: 9.1 mg/dL (ref 8.4–10.5)
Calcium: 9.1 mg/dL (ref 8.4–10.5)
Calcium: 8.9 mg/dL (ref 8.4–10.5)
Calcium: 8.9 mg/dL (ref 8.4–10.5)
Chloride: 110 mEq/L (ref 96–112)
Chloride: 104 mEq/L (ref 96–112)
Chloride: 104 mEq/L (ref 96–112)
Creatinine, Ser: 1.11 mg/dL — ABNORMAL HIGH (ref 0.50–1.10)
Creatinine, Ser: 1.14 mg/dL — ABNORMAL HIGH (ref 0.50–1.10)
Creatinine, Ser: 1.17 mg/dL — ABNORMAL HIGH (ref 0.50–1.10)
GFR calc Af Amer: 81 mL/min — ABNORMAL LOW (ref >90)
GFR calc Af Amer: 78 mL/min — ABNORMAL LOW (ref >90)
GFR calc Af Amer: 76 mL/min — ABNORMAL LOW (ref >90)
GFR calc Af Amer: 84 mL/min — ABNORMAL LOW (ref >90)
GFR calc non Af Amer: 70 mL/min — ABNORMAL LOW (ref >90)
GFR calc non Af Amer: 72 mL/min — ABNORMAL LOW (ref >90)
GFR, EST AFRICAN AMERICAN: 80 mL/min — AB (ref >90)
GFR, EST NON AFRICAN AMERICAN: 69 mL/min — AB (ref >90)
GFR, EST NON AFRICAN AMERICAN: 68 mL/min — AB (ref >90)
GFR, EST NON AFRICAN AMERICAN: 66 mL/min — AB (ref >90)
GLUCOSE: 410 mg/dL — AB (ref 70–99)
GLUCOSE: 164 mg/dL — AB (ref 70–99)
Glucose, Bld: 163 mg/dL — ABNORMAL HIGH (ref 70–99)
Glucose, Bld: 391 mg/dL — ABNORMAL HIGH (ref 70–99)
Glucose, Bld: 428 mg/dL — ABNORMAL HIGH (ref 70–99)
POTASSIUM: 3.7 meq/L (ref 3.7–5.3)
Potassium: 3.4 mEq/L — ABNORMAL LOW (ref 3.7–5.3)
Potassium: 4.1 mEq/L (ref 3.7–5.3)
Potassium: 4 mEq/L (ref 3.7–5.3)
Potassium: 4.4 mEq/L (ref 3.7–5.3)
SODIUM: 135 meq/L — AB (ref 137–147)
Sodium: 141 mEq/L (ref 137–147)
Sodium: 135 mEq/L — ABNORMAL LOW (ref 137–147)
Sodium: 137 mEq/L (ref 137–147)
Sodium: 137 mEq/L (ref 137–147)

## 2014-02-14 LAB — GLUCOSE, CAPILLARY
GLUCOSE-CAPILLARY: 174 mg/dL — AB (ref 70–99)
GLUCOSE-CAPILLARY: 165 mg/dL — AB (ref 70–99)
GLUCOSE-CAPILLARY: 165 mg/dL — AB (ref 70–99)
GLUCOSE-CAPILLARY: 137 mg/dL — AB (ref 70–99)
GLUCOSE-CAPILLARY: 165 mg/dL — AB (ref 70–99)
GLUCOSE-CAPILLARY: 403 mg/dL — AB (ref 70–99)
GLUCOSE-CAPILLARY: 167 mg/dL — AB (ref 70–99)
GLUCOSE-CAPILLARY: 136 mg/dL — AB (ref 70–99)
Glucose-Capillary: 156 mg/dL — ABNORMAL HIGH (ref 70–99)
Glucose-Capillary: 158 mg/dL — ABNORMAL HIGH (ref 70–99)
Glucose-Capillary: 160 mg/dL — ABNORMAL HIGH (ref 70–99)
Glucose-Capillary: 163 mg/dL — ABNORMAL HIGH (ref 70–99)
Glucose-Capillary: 266 mg/dL — ABNORMAL HIGH (ref 70–99)
Glucose-Capillary: 401 mg/dL — ABNORMAL HIGH (ref 70–99)

## 2014-02-14 MED ORDER — INSULIN ASPART 100 UNIT/ML ~~LOC~~ SOLN
0.0000 [IU] | SUBCUTANEOUS | Status: DC | PRN
  Administered 2014-02-14: 9 [IU] via SUBCUTANEOUS

## 2014-02-14 MED ORDER — SODIUM CHLORIDE 0.9 % IV SOLN: INTRAVENOUS | Status: DC

## 2014-02-14 MED ORDER — TRAMADOL HCL 50 MG PO TABS
50.0000 mg | ORAL_TABLET | Freq: Four times a day (QID) | ORAL | Status: DC | PRN
  Administered 2014-02-14: 50 mg via ORAL
  Filled 2014-02-14: qty 1

## 2014-02-14 MED ORDER — INSULIN GLARGINE 100 UNIT/ML ~~LOC~~ SOLN
5.0000 [IU] | Freq: Once | SUBCUTANEOUS | Status: AC
  Administered 2014-02-14: 5 [IU] via SUBCUTANEOUS
  Filled 2014-02-14: qty 0.05

## 2014-02-14 MED ORDER — POTASSIUM CHLORIDE 10 MEQ/100ML IV SOLN
10.0000 meq | INTRAVENOUS | Status: AC
  Administered 2014-02-14 (×2): 10 meq via INTRAVENOUS

## 2014-02-14 MED ORDER — INSULIN GLARGINE 100 UNIT/ML ~~LOC~~ SOLN
10.0000 [IU] | Freq: Every day | SUBCUTANEOUS | Status: DC
Start: 2014-02-14 — End: 2014-02-14
  Filled 2014-02-14: qty 0.1

## 2014-02-14 MED ORDER — INSULIN ASPART 100 UNIT/ML ~~LOC~~ SOLN
0.0000 [IU] | SUBCUTANEOUS | Status: DC
Start: 2014-02-14 — End: 2014-02-15
  Administered 2014-02-14 – 2014-02-15 (×4): 2 [IU] via SUBCUTANEOUS

## 2014-02-14 MED ORDER — POTASSIUM CHLORIDE 10 MEQ/100ML IV SOLN
10.0000 meq | INTRAVENOUS | Status: AC
  Filled 2014-02-14: qty 100

## 2014-02-14 MED ORDER — SODIUM CHLORIDE 0.9 % IV SOLN
INTRAVENOUS | Status: AC
  Administered 2014-02-14 – 2014-02-15 (×2): via INTRAVENOUS

## 2014-02-14 MED ORDER — INSULIN GLARGINE 100 UNIT/ML ~~LOC~~ SOLN
15.0000 [IU] | Freq: Every day | SUBCUTANEOUS | Status: DC
  Filled 2014-02-14 (×2): qty 0.15

## 2014-02-14 MED ORDER — INSULIN ASPART 100 UNIT/ML ~~LOC~~ SOLN: 0.0000 [IU] | Freq: Three times a day (TID) | SUBCUTANEOUS | Status: DC

## 2014-02-14 MED ORDER — INSULIN GLARGINE 100 UNIT/ML ~~LOC~~ SOLN
10.0000 [IU] | Freq: Every day | SUBCUTANEOUS | Status: DC
  Administered 2014-02-14: 10 [IU] via SUBCUTANEOUS
  Filled 2014-02-14: qty 0.1

## 2014-02-14 NOTE — Progress Notes (Signed)
Subjective:    [name] continues to do well this morning without abdominal pain, nausea, or light-headedness.  She feels hungry and says she is ready to eat.  She has been having numbness and pain in her bilateral lower extremities to the mid-calf since June.  She says that she does not currently have insurance because her mother has not been paying for it, but that her mother is planning on reinitiating the insurance soon. Her mother will be here today to visit her in the hospital.  Interval Events: -Insulin drip stopped twice yesterday due to protocol with low glucose, AG had closed then reopened. -This morning AG 12 with bicarb of 10. -K supplemented with 3 10 mEq runs yesterday, K 3.4 this morning. -On D5 1/2 NS at 100 cc/h   Objective:    Vital Signs:   Temp:  [98.4 F (36.9 C)-98.7 F (37.1 C)] 98.4 F (36.9 C) (08/27 0700) Pulse Rate:  [101-114] 101 (08/27 0712) Resp:  [9-24] 13 (08/27 0712) BP: (92-126)/(49-88) 115/85 mmHg (08/27 0712) SpO2:  [99 %-100 %] 100 % (08/27 0712) Last BM Date: 02/09/14  24-hour weight change: Weight change:   Intake/Output:   Intake/Output Summary (Last 24 hours) at 02/14/14 0929 Last data filed at 02/14/14 0900  Gross per 24 hour  Intake 2915.3 ml  Output    250 ml  Net 2665.3 ml      Physical Exam: General: Vital signs reviewed and noted. Well-developed, well-nourished, in no acute distress; alert, appropriate and cooperative throughout examination.  Lungs:  Normal respiratory effort. Clear to auscultation BL without crackles or wheezes.  Heart: Tachycardic. S1 and S2 normal without gallop, murmur, or rubs.  Abdomen:  BS normoactive. Soft, Nondistended, non-tender.  No masses or organomegaly.  Extremities: No pretibial edema.     Labs:  Basic Metabolic Panel:  Recent Labs Lab 02/12/14 0732 02/12/14 0910  02/13/14 1312 02/13/14 1500 02/13/14 1708 02/13/14 1935 02/14/14 0555  NA 129*  --   < > 143 140 141 141  141  K 4.9  --   < > 3.8 4.3 3.9 3.7 3.4*  CL 88*  --   < > 113* 109 111 110 110  CO2 11*  --   < > 17* 14* 17* 17* 19  GLUCOSE 618*  --   < > 122* 229* 177* 119* 163*  BUN 34*  --   < > 12 11 10 9 7   CREATININE 1.96*  --   < > 1.22* 1.24* 1.22* 1.20* 1.12*  CALCIUM 10.3  --   < > 9.1 9.3 8.8 9.0 9.1  MG  --  2.1  --   --   --   --   --   --   PHOS  --  4.3  --   --   --   --   --   --   < > = values in this interval not displayed.  Liver Function Tests:  Recent Labs Lab 02/12/14 0939  AST 7  ALT 6  ALKPHOS 114  BILITOT 0.2*  PROT 7.7  ALBUMIN 3.3*    Recent Labs Lab 02/12/14 0910  LIPASE 33   CBC:  Recent Labs Lab 02/12/14 0732 02/13/14 0109  WBC 5.5 6.0  HGB 13.4 9.6*  HCT 40.7 29.3*  MCV 82.6 80.7  PLT 407* 306    Cardiac Enzymes:  Recent Labs Lab 02/12/14 0939  TROPONINI <0.30   CBG:  Recent Labs Lab 02/14/14 0302 02/14/14  0411 02/14/14 0511 02/14/14 0617 02/14/14 0715  GLUCAP 165* 160* 165* 9* 165*    Microbiology: Results for orders placed during the hospital encounter of 02/12/14  GC/CHLAMYDIA PROBE AMP     Status: None   Collection Time    02/12/14 10:05 AM      Result Value Ref Range Status   CT Probe RNA NEGATIVE  NEGATIVE Final   GC Probe RNA NEGATIVE  NEGATIVE Final   Comment: (NOTE)                                                                                               **Normal Reference Range: Negative**          Assay performed using the Gen-Probe APTIMA COMBO2 (R) Assay.     Acceptable specimen types for this assay include APTIMA Swabs (Unisex,     endocervical, urethral, or vaginal), first void urine, and ThinPrep     liquid based cytology samples.     Performed at [place] PCR SCREENING     Status: None   Collection Time    02/12/14  5:31 PM      Result Value Ref Range Status   MRSA by PCR NEGATIVE  NEGATIVE Final   Comment:            The GeneXpert MRSA Assay (FDA     approved for NASAL  specimens     only), is one component of a     comprehensive MRSA colonization     surveillance program. It is not     intended to diagnose MRSA     infection nor to guide or     monitor treatment for     MRSA infections.   Imaging: Dg Chest 2 View  02/12/2014   CLINICAL DATA:  Difficulty breathing  EXAM: CHEST  2 VIEW  COMPARISON:  None.  FINDINGS: Lungs are clear. Heart size and pulmonary vascularity are normal. No adenopathy. No adenopathy. There is mild lower thoracic dextroscoliosis.  IMPRESSION: No edema or consolidation.   Electronically Signed   By: Lowella Grip M.D.   On: 02/12/2014 10:42     Medications:    Infusions: . dextrose 5 % and 0.45% NaCl 100 mL/hr at 02/14/14 0900  . insulin (NOVOLIN-R) infusion 0.1 Units/hr (02/13/14 2251)    Scheduled Medications: . heparin  5,000 Units Subcutaneous 3 times per day  . insulin regular  0-10 Units Intravenous TID WC  . potassium chloride  10 mEq Intravenous Q1 Hr x 2  . 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  Will wait for one more BMP  before transitioning to subq insulin, given wide gap at presentation and risk of gap reopening if transitioned too early.  Access to medications appears to be an issue for her diabetes control as she does not currently have insurance.  Her mother will be here today to discuss with case management, but we will have to consider cost when choosing her insulin regimen.  Given her poor compliance, we will start low with  long-acting insulin to avoid hypoglycemia.  Patient is very thin and likely very insulin responsive. -NPO until transitioned off insulin gtt. -Continue insulin gtt until gap closed and bicarb ~20. -Will start Lantus at 10 units subq when comes off with SSI sensitive. -Continue D5 1/2 NS at 100 ml/hr until transitioned, then switch to NS. -BMP q2h. -CBG hourly. -20 mEq K IV supplementation (2 10 mEq in 100 ml).  #Acute on chronic renal failure Back to baseline of 1.12 this morning.  Was likely elevated due to dehydration with CKD due to diabetic nephropathy. -Monitor BMP. -Improve glucose control.  #Tachycardia  Back to baseline with HR 102 this morning.  Patient not volume down currently with resolution presyncope symptoms. -No further treatment.  #Abdominal pain and nausea No Zofran in last 24 hours, used morphine twice in last 24 hours, but likely taking for leg pain.  Does not complain of symptoms this morning -No further work-up unless symptoms return. -Zofran 4 mg q4h PRN. -Morphine 1 mg q6h PRN.  #Peripheral neuropathy Leg symptoms likely due to diabetic neuropathy. She has had very poor glucose control. Her symptoms may resolve with improved blood sugars.  Since this has been going on for a couple of months, we will try to control her sugars and see if that improves her symptoms.  If she continues to have problems, may be worth trying amitriptyline or Lyrica as an outpatient. -Improve glucose control. -Possibly start amitriptyline as an outpatient if symptoms continue with better sugar control.   DVT PPX - heparin  CODE STATUS - Full code.  CONSULTS PLACED - None.  DISPO - Disposition is deferred at this time, awaiting improvement of DKA.   Anticipated discharge in approximately 1-2 day(s).   The patient does not have a current PCP (No primary provider on file.) and does need an [facility] hospital follow-up appointment after discharge.    Is the [facility] hospital follow-up  appointment a one-time only appointment? yes.  Does the patient have transportation limitations that hinder transportation to clinic appointments? yes   SERVICE NEEDED AT [place]         Y = Yes, Blank = No PT:   OT:   RN:   Equipment:   Other:      Length of Stay: 2 day(s)   Signed: Arman Filter, MD  PGY-1, Internal Medicine Resident Pager: [phone] (7AM-5PM) 02/14/2014, 9:29 AM

## 2014-02-14 NOTE — Progress Notes (Signed)
Inpatient Diabetes Program Recommendations  AACE/ADA: New Consensus Statement on Inpatient Glycemic Control (2013)  Target Ranges:  Prepandial:   less than 140 mg/dL      Peak postprandial:   less than 180 mg/dL (1-2 hours)      Critically ill patients:  140 - 180 mg/dL   Reason for Assessment:  Spoke to RN regarding patient's CBG's and drip rate on GlucoStabilizer.  Patient transitioning off insulin drip today.  It appears that insulin drip was at 0.1 units/hr from 0800 til 1300 due to drip rate being 0 on GlucoStabilizer.   Note that patient received Lantus 10 units at 1148 pm. Blood sugar up to 401 at 1310 and patient received 9 units of Novolog.  At 1444, CBG down to 266 mg/dL.  Patient received an additional 5 units of Lantus at 1438. Discussed with MD and medical student.  Agree with the increase in Lantus and Novolog sensitive q 4 hours.  Once patient is eating, she will also need additional meal coverage 4 units tid with meals (hold if patient eats less than 50%).  Spoke at length to patient, mother and father.  Patient states that sometimes she does not take her Lantus because she is not eating or CBG is low.  She has had some lows in the middle of the night and early morning.  Explained that this could be due to too much basal insulin, however told her that she should need basal insulin daily due to Type 1 diabetes.  Briefly discussed physiology of Type 1 diabetes and the concept of thinking like a pancreas.  She is a very intelligent young lady, and would likely do well with CHO counting, correction etc. In order to have more control of her diabetes.  Mom states that she was diagnosed at age 69 and went to [facility] endocrinology.  She has not been able to afford to pay her medical insurance premiums due to the high cost of her insulin this summer ($700).  Patient states that she still has 4 pens left of each type of insulin.  Discussed with patient and MD's possibility of her going to [facility] so she  can get assistance with medications and see MD.  She states that she went to a clinic in July but that they were not as familiar with her diabetes and referred her to a specialist.  Will request case manager to see patient regarding being set up at clinic.  Also discussed that she may need generic glucose meter so that she can monitor more frequently.  She admits that she does not monitor often due to cost of strips.  Mom states that patient has had a rough summer and has been sad due to the pain in her legs, feet and back.  She states that the patient is usually very active but that she did very little this summer.  Explained importance of improved glycemic control.  Mother states that she has tried to allow patient to take responsibility of her health, however a lack of resources has been very difficult.  Will give them vouchers for future insulin purchase which is only good for one prescription of Lantus and Humalog.  Will also order outpatient diabetes education follow-up due to history of Type 1 diabetes.    Thanks, Adah Perl, RN, BC-ADM Inpatient Diabetes Coordinator Pager [phone]

## 2014-02-14 NOTE — Progress Notes (Signed)
I have reviewed and discussed the plan with Darcus Austin, MS3.  Please see my separate note.  Arman Filter, MD, PhD Internal Medicine Intern Pager: [phone] 02/14/2014,11:47 AM

## 2014-02-14 NOTE — Progress Notes (Signed)
Subjective: [name] is a 23 YO woman with a history of DM1 on hospital day 2 who was admitted for DKA with a BG of 619 and anion gap of 30. This morning, she states that she feels better. Her nausea, vomiting, dizziness, and abdominal pain have completely resolved. Her BL tingling leg pain is her only complaint. She denies SOB, chest pain, and fever. She is ambulating to the bathroom and around the room. She states that she feels hungry and hopes to eat sometime today.   Objective: Vital signs in last 24 hours: Filed Vitals:   02/13/14 1924 02/13/14 2358 02/14/14 0413 02/14/14 0700  BP: 122/84 117/74 117/80   Pulse: 114 105 102   Temp: 98.7 F (37.1 C) 98.5 F (36.9 C) 98.7 F (37.1 C) 98.4 F (36.9 C)  TempSrc: Oral Oral Oral Oral  Resp: 15 22    Height:      Weight:      SpO2: 100% 100% 100%    Weight change:   Intake/Output Summary (Last 24 hours) at 02/14/14 0852 Last data filed at 02/13/14 1924  Gross per 24 hour  Intake 1465.3 ml  Output    250 ml  Net 1215.3 ml   BP 117/80  Pulse 102  Temp(Src) 98.4 F (36.9 C) (Oral)  Resp 22  Ht 5\' 1"  (1.549 m)  Wt 50.2 kg (110 lb 10.7 oz)  BMI 20.92 kg/m2  SpO2 100%  LMP 01/09/2014 General appearance: alert, cooperative and no distress Head: Normocephalic, without obvious abnormality, atraumatic Lungs: clear to auscultation bilaterally Heart: tachycardic, NL S1/S2, no murmurs, rubs, or gallops Abdomen: soft, non-tender; bowel sounds normal; no masses,  no organomegaly Extremities: extremities normal, atraumatic, no cyanosis or edema with reported tingling and discomfort up to the mid-shin bilaterally  Lab Results: Basic Metabolic Panel:  Recent Labs Lab 02/12/14 0732 02/12/14 0910  02/13/14 1935 02/14/14 0555  NA 129*  --   < > 141 141  K 4.9  --   < > 3.7 3.4*  CL 88*  --   < > 110 110  CO2 11*  --   < > 17* 19  GLUCOSE 618*  --   < > 119* 163*  BUN 34*  --   < > 9 7  CREATININE 1.96*  --   < > 1.20*  1.12*  CALCIUM 10.3  --   < > 9.0 9.1  MG  --  2.1  --   --   --   PHOS  --  4.3  --   --   --   < > = values in this interval not displayed.  CBC:  Recent Labs Lab 02/12/14 0732 02/13/14 0109  WBC 5.5 6.0  HGB 13.4 9.6*  HCT 40.7 29.3*  MCV 82.6 80.7  PLT 407* 306   CBG:  Recent Labs Lab 02/14/14 0156 02/14/14 0302 02/14/14 0411 02/14/14 0511 02/14/14 0617 02/14/14 0715  GLUCAP 174* 165* 160* 165* 137* 165*    Medications: I have reviewed the patient's current medications. Scheduled Meds: . heparin  5,000 Units Subcutaneous 3 times per day  . insulin regular  0-10 Units Intravenous TID WC  . potassium chloride  10 mEq Intravenous Q1 Hr x 2  . sodium chloride  3 mL Intravenous Q12H   Continuous Infusions: . dextrose 5 % and 0.45% NaCl 100 mL/hr at 02/14/14 0417  . insulin (NOVOLIN-R) infusion 0.1 Units/hr (02/13/14 2251)   PRN Meds:.acetaminophen, acetaminophen, dextrose, morphine injection, ondansetron (ZOFRAN)  IV Assessment/Plan: Principal Problem:   DKA, type 1 Active Problems:   Acute on chronic renal failure   Type II or unspecified type diabetes mellitus with renal manifestations, uncontrolled   Tachycardia   DOE (dyspnea on exertion)   Abdominal pain   DKA, type 1, not at goal  DKA in uncontrolled type 1 DM with acute on chronic renal failure--AG 30, bicarb of 11, VBG 7.2/28.02/17/12 on admission. Currently, patient's symptoms of nausea, vomiting, abdominal pain, and weakness have resolved. Most recent AG 12, bicarb of 19 at [facility]. Currently, patient is on D5/ 1/2 NS at 100 mL/hr and 250 units regular insulin in 0.9% NaCl continuous IV since her BG levels are less than 250. Most recent BG level is 165 at 0715. In the past 12 hours, patient's BG levels ranged from 137-192. Waiting for next BMET to see if AG remains closed and bicarb is improving. If AG is closed and HCO3 is improving or stable, we will stop her insulin drip and start subcut long acting  insulin 10-15 units. 1 hour after the drip has stopped, we will transition her to NS. She will be on sliding scale insulin with meals as we advance her diet. After she starts eating, we will discontinue fluids and monitor her BG levels closely. BUN:Crt 7/1.12 this morning.  Prior to presentation, patient endorses hypoglycemic episodes in the middle of the night with current dose of Lantus 28 units at 10pm every night and novolog 6 units with meals. Last HbA1C 17 12/06/13. Will consult with diabetes educator while inpatient and look to schedule follow up with endocrine to better control her average glucose levels. Patient states that she sometimes has trouble getting her medications at home because they are expensive. She is going to start a new insurance plan soon, so we will follow up with her to see if she knows more information about the policy and coverage of diabetic supplies. -bmet's q2h, replace electrolytes as needed  -cbg monitoring  -transition to [place] insulin 10-15 U when AG closed  -1 hour after insulin drip stopped, switch to NS -ADAT once AG is closed -Sliding scale insulin with meals -Once patient is eating, stop fluids and monitor BG levels closely -Kcl 68meq added once insulin gtt started for now  -NPO, can tolerate ice chips until AG as closed  -Consult diabetic coordinator    Diabetic neuropathy - Patient states that her BL leg tingling/pain started in June and that sometimes the pain wakes her up at night. Pain begins at toes and radiates up to the mid-shin bilaterally. Discussed patient's diabetic neuropathy with patient and Dr. Trudee Kuster this morning. Recommended that the patient work on obtaining better glucose control prior to initiation of medication. Patient agreed with this plan. If glucose control does not resolve the pain, consider starting amitriptyline with PCP.  Abdominal pain-- completely resolved. Likely due to vomiting.   DOE-- resolved. CXR negative. Likely due to  hypovolemic state.   Tachycardia--likely in setting of dehydration and DKA. Sinus tachycardia on EKG with non-specific t wave changes. HR ~100's this morning. Patient reports that her baseline HR is tachycardic, normally averaging around 110 bpm.   Diet: NPO, ice chips okay   DVT Ppx: Heparin 5000 U subcut q8h   Dispo: Disposition is deferred at this time, awaiting improvement of current medical problems. Anticipated discharge in approximately 1-2 day(s).   The patient does have a current PCP (dpes not recall name) and does not need an [facility] hospital follow-up appointment after  discharge.   The patient does not have transportation limitations that hinder transportation to clinic appointments.  This is a Careers information officer Note.  The care of the patient was discussed with Dr. Trudee Kuster and the assessment and plan formulated with their assistance.  Please see their attached note for official documentation of the daily encounter.   LOS: 2 days   Darcus Austin, Med Student 02/14/2014, 8:52 AM

## 2014-02-15 DIAGNOSIS — G609 Hereditary and idiopathic neuropathy, unspecified: Secondary | ICD-10-CM

## 2014-02-15 DIAGNOSIS — E1165 Type 2 diabetes mellitus with hyperglycemia: Secondary | ICD-10-CM

## 2014-02-15 DIAGNOSIS — E1129 Type 2 diabetes mellitus with other diabetic kidney complication: Secondary | ICD-10-CM

## 2014-02-15 LAB — BASIC METABOLIC PANEL
ANION GAP: 9 (ref 5–15)
BUN: 6 mg/dL (ref 6–23)
CO2: 21 meq/L (ref 19–32)
CREATININE: 1.18 mg/dL — AB (ref 0.50–1.10)
Calcium: 8.3 mg/dL — ABNORMAL LOW (ref 8.4–10.5)
Chloride: 109 mEq/L (ref 96–112)
GFR calc Af Amer: 75 mL/min — ABNORMAL LOW (ref >90)
GFR calc non Af Amer: 65 mL/min — ABNORMAL LOW (ref >90)
GLUCOSE: 119 mg/dL — AB (ref 70–99)
Potassium: 3.7 mEq/L (ref 3.7–5.3)
Sodium: 139 mEq/L (ref 137–147)

## 2014-02-15 LAB — HEMOGLOBIN A1C
Hgb A1c MFr Bld: 12.3 % — ABNORMAL HIGH (ref <5.7)
MEAN PLASMA GLUCOSE: 306 mg/dL — AB (ref <117)

## 2014-02-15 LAB — GLUCOSE, CAPILLARY
Glucose-Capillary: 161 mg/dL — ABNORMAL HIGH (ref 70–99)
Glucose-Capillary: 110 mg/dL — ABNORMAL HIGH (ref 70–99)
Glucose-Capillary: 266 mg/dL — ABNORMAL HIGH (ref 70–99)
Glucose-Capillary: 235 mg/dL — ABNORMAL HIGH (ref 70–99)

## 2014-02-15 MED ORDER — INSULIN ASPART 100 UNIT/ML ~~LOC~~ SOLN
0.0000 [IU] | Freq: Three times a day (TID) | SUBCUTANEOUS | Status: DC
  Administered 2014-02-15: 5 [IU] via SUBCUTANEOUS

## 2014-02-15 MED ORDER — POLYETHYLENE GLYCOL 3350 17 G PO PACK
17.0000 g | PACK | Freq: Once | ORAL | Status: AC
  Administered 2014-02-15: 17 g via ORAL
  Filled 2014-02-15 (×2): qty 1

## 2014-02-15 MED ORDER — INSULIN GLARGINE 100 UNIT/ML ~~LOC~~ SOLN
15.0000 [IU] | Freq: Every day | SUBCUTANEOUS | Status: DC
  Administered 2014-02-15: 15 [IU] via SUBCUTANEOUS
  Filled 2014-02-15: qty 0.15

## 2014-02-15 MED ORDER — INSULIN ASPART 100 UNIT/ML ~~LOC~~ SOLN: 3.0000 [IU] | Freq: Three times a day (TID) | SUBCUTANEOUS | Status: DC

## 2014-02-15 MED ORDER — INSULIN GLARGINE 100 UNIT/ML ~~LOC~~ SOLN: 15.0000 [IU] | Freq: Every morning | SUBCUTANEOUS | Status: DC

## 2014-02-15 NOTE — Progress Notes (Signed)
Called and spoke to patient by phone regarding A1C which is down to 12.3%.  Also noted that patient is going home on less insulin then she was on at admit.  Called and discussed with resident.  May benefit from Novolog correction scale that starts at 151 mg/dL or at least a phone number for a MD so that if CBG goes up she can get instructions on what to do about her insulin doses.  Note that she will follow-up at [facility] on Tuesday.  Will follow.   Adah Perl, RN, BC-ADM Inpatient Diabetes Coordinator Pager [phone]

## 2014-02-15 NOTE — Progress Notes (Signed)
Subjective:    [name] continues to do well this morning without nausea or light-headedness.  She had some abdominal cramping last night because she started her period.  She at dinner last night without issue.  She denies headaches, SOB, or chest tightness.  Interval Events: -Transitioned to subq insulin. -Received 15 units Lantus and has received 17 units of Aspart as sliding scale. -AG 9 with CO2 21 this morning.   Objective:    Vital Signs:   Temp:  [98.3 F (36.8 C)-99 F (37.2 C)] 99 F (37.2 C) (08/28 0432) Pulse Rate:  [103-108] 103 (08/28 0432) Resp:  [12-26] 23 (08/28 0432) BP: (106-133)/(63-96) 106/63 mmHg (08/28 0432) SpO2:  [100 %] 100 % (08/28 0432) Last BM Date: 02/09/14  24-hour weight change: Weight change:   Intake/Output:   Intake/Output Summary (Last 24 hours) at 02/15/14 [id number] Last data filed at 02/15/14 0300  Gross per 24 hour  Intake   3860 ml  Output    900 ml  Net   2960 ml      Physical Exam: General: Vital signs reviewed and noted. Well-developed, well-nourished, in no acute distress; alert, appropriate and cooperative throughout examination.  Lungs:  Normal respiratory effort. Clear to auscultation BL without crackles or wheezes.  Heart: Tachycardic. S1 and S2 normal without gallop, murmur, or rubs.  Abdomen:  Mild tenderness to suprapubic palpation. BS normoactive. Soft, Nondistended, non-tender.  No masses or organomegaly.  Extremities: No pretibial edema.     Labs:  Basic Metabolic Panel:  Recent Labs Lab 02/12/14 0732 02/12/14 0910  02/14/14 1040 02/14/14 1130 02/14/14 1330 02/14/14 1900 02/15/14 0105  NA 129*  --   < > 135* 135* 137 137 139  K 4.9  --   < > 4.1 4.0 4.4 3.7 3.7  CL 88*  --   < > 104 104 104 107 109  CO2 11*  --   < > 17* 17* 17* 20 21  GLUCOSE 618*  --   < > 391* 428* 410* 164* 119*  BUN 34*  --   < > 6 7 7 6 6   CREATININE 1.96*  --   < > 1.11* 1.14* 1.17* 1.08 1.18*  CALCIUM 10.3  --   < > 9.1  9.1 8.9 8.9 8.3*  MG  --  2.1  --   --   --   --   --   --   PHOS  --  4.3  --   --   --   --   --   --   < > = values in this interval not displayed.  Liver Function Tests:  Recent Labs Lab 02/12/14 0939  AST 7  ALT 6  ALKPHOS 114  BILITOT 0.2*  PROT 7.7  ALBUMIN 3.3*    Recent Labs Lab 02/12/14 0910  LIPASE 33   CBC:  Recent Labs Lab 02/12/14 0732 02/13/14 0109  WBC 5.5 6.0  HGB 13.4 9.6*  HCT 40.7 29.3*  MCV 82.6 80.7  PLT 407* 306    Cardiac Enzymes:  Recent Labs Lab 02/12/14 0939  TROPONINI <0.30   CBG:  Recent Labs Lab 02/14/14 1444 02/14/14 1611 02/14/14 1752 02/14/14 1931 02/14/14 2346  GLUCAP 266* 167* 136* 158* 156*    Microbiology: Results for orders placed during the hospital encounter of 02/12/14  GC/CHLAMYDIA PROBE AMP     Status: None   Collection Time    02/12/14 10:05 AM      Result  Value Ref Range Status   CT Probe RNA NEGATIVE  NEGATIVE Final   GC Probe RNA NEGATIVE  NEGATIVE Final   Comment: (NOTE)                                                                                               **Normal Reference Range: Negative**          Assay performed using the Gen-Probe APTIMA COMBO2 (R) Assay.     Acceptable specimen types for this assay include APTIMA Swabs (Unisex,     endocervical, urethral, or vaginal), first void urine, and ThinPrep     liquid based cytology samples.     Performed at [facility] PCR SCREENING     Status: None   Collection Time    02/12/14  5:31 PM      Result Value Ref Range Status   MRSA by PCR NEGATIVE  NEGATIVE Final   Comment:            The GeneXpert MRSA Assay (FDA     approved for NASAL specimens     only), is one component of a     comprehensive MRSA colonization     surveillance program. It is not     intended to diagnose MRSA     infection nor to guide or     monitor treatment for     MRSA infections.     Medications:    Infusions:    Scheduled  Medications: . heparin  5,000 Units Subcutaneous 3 times per day  . insulin aspart  0-9 Units Subcutaneous 6 times per day  . insulin glargine  15 Units Subcutaneous QHS  . 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  Diabetes coordinator will provide with vouchers for Lantus and Humalog upon discharge. Will establish care at [facility] where they will be able to help with insulin needs. -Switch to home insulin regimen, Lantus 15 units in the morning with 3 units Novalog with meals. -Stop IVF. -Carb modified diet. -CBG ACHS. -SSI sensitive AC.  #Acute on chronic renal failure Creatinine at baseline with chronic renal failure due to diabetes. -Monitor BMP. -Improve glucose control.  #Tachycardia  At her baseline of 103. -Continue to monitor.  #Abdominal pain and nausea New menstrual cramping unresponsive to Tylenol.  Given tramadol 50 mg q6h PRN by night team. Nausea has resolved, not taking Zofran. -Continue tramadol while inpatient, will not write on discharge.  #Peripheral neuropathy Stable, will try to treat with improved glucose control. -Improve glucose control. -Possibly start amitriptyline as an  outpatient if symptoms continue with better sugar control.   DVT PPX - heparin  CODE STATUS - Full code.  CONSULTS PLACED - None.  DISPO - Discharge home today.  The patient does not have a current PCP (No primary provider on file.) and does need  an [facility] hospital follow-up appointment after discharge.    Is the [facility] hospital follow-up appointment a one-time only appointment? yes.  Does the patient have transportation limitations that hinder transportation to clinic appointments? yes   SERVICE NEEDED AT [facility]         Y = Yes, Blank = No PT:   OT:   RN:   Equipment:   Other:      Length of Stay: 3 day(s)  Signed: Arman Filter, MD  PGY-1, Internal Medicine Resident Pager: [phone] (7AM-5PM) 02/15/2014, 8:22 AM

## 2014-02-15 NOTE — Discharge Summary (Signed)
Name: [name] MRN: [id number] DOB: 12-10-1990 23 y.o. PCP: No primary provider on file.  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     insulin aspart 100 UNIT/ML injection  Commonly known as:  novoLOG  Inject 3 Units into the skin 3 (three) times daily with meals. Do not use if your blood sugar is <100 or you eat <50% of your meal.     insulin glargine 100 UNIT/ML injection  Commonly known as:  LANTUS  Inject 0.15 mLs (15 Units total) into the skin every morning.        Disposition and follow-up:   [name] was discharged from [facility] in Good condition.  At the hospital follow up visit please address:  1.  Review blood sugars and adjust insulin regimen, make sure she has established with a PCP and is able to get her insulin, consider prescribing amitriptyline for peripheral neuropathy.  2.  Labs / imaging needed at time of follow-up: BMP.  3.  Pending labs/ test needing follow-up: None.  Follow-up Appointments:     Follow-up Information   Follow up with [facility]    . (Schedule an appointment in the next week.)    Contact information:   [place] [address] [phone]      Discharge Instructions:  Thank you for allowing [place] to be involved in your healthcare while you were hospitalized at [facility].   Please note that there have been changes to your home medications.  --> PLEASE LOOK AT YOUR DISCHARGE MEDICATION LIST FOR DETAILS.   Please call your PCP if you have any questions or concerns, or any difficulty getting any of your medications.  Please return to the ER if you have worsening of your symptoms or new severe symptoms arise.  Make sure to get the insulin vouchers from the diabetes coordinator before your leave.  Pick up you insulin.  We recommend that you use your Lantus in the morning so it is easier to use.  Check your sugar 4 times a day so you can see if your new insulin regimen is working.  Make sure to establish with a new PCP in the [place], they will be able to help you get your insulin until you get your insurance.  Call our clinic at [phone] if you have any trouble getting in to see a doctor or if your blood sugars start running too high and are not responding to your insulin regimen. Discharge Instructions   Ambulatory referral  to Nutrition and Diabetic Education    Complete by:  As directed   A1C=17 in June, 2015.  Type 1 diabetes.     Call MD for:  difficulty breathing, headache or visual disturbances    Complete by:  As directed      Call MD for:  extreme fatigue    Complete by:  As directed      Call MD for:  persistant dizziness or light-headedness    Complete by:  As directed      Call MD for:  persistant nausea and vomiting    Complete by:  As directed      Call MD for:  severe uncontrolled pain    Complete by:  As directed      Call MD for:  temperature >100.4    Complete by:  As directed      Diet - low sodium heart healthy    Complete by:  As directed      Increase activity slowly    Complete by:  As  directed            Consultations: Diabetic coordinator.  Procedures Performed:  Dg Chest 2 View  02/12/2014   CLINICAL DATA:  Difficulty breathing  EXAM: CHEST  2 VIEW  COMPARISON:  None.  FINDINGS: Lungs are clear. Heart size and pulmonary vascularity are normal. No adenopathy. No adenopathy. There is mild lower thoracic dextroscoliosis.  IMPRESSION: No edema or consolidation.   Electronically Signed   By: Lowella Grip M.D.   On: 02/12/2014 10:42   Admission HPI:  [name] is a 23 year old female with uncontrolled DM1 presenting to the ED today with intermittent nausea and vomiting since Sunday night. She reports feeling well during the day but started vomiting at night that has persisted to today. Her sugars around the time her symptoms started were around 300. Her emesis has been mainly her food that she ate prior to getting sick and clear liquid, but this morning turned green. She also endorses associated abdominal pain, non-radiating to the back, intermittent, 6/10, improved with tylenol but still present, and DOE along with feeling like she will faint while while walking. She denies syncope, headache, chest pain, dysuria, but does have urinate frequently and is thirsty. She does not recall the name of her PCP office but has not been for a while and is using the lantus prescription from last hospitalization. She has been seen by endocrinology at [facility] in the past and that is where she was diagnosed. She denies recent sick contacts or feeling sick but has some chills today and feels weak. She denies any tobacco or ilicit drug use but does occasionally drink alcohol, none recently however. She does feel like her heart is racing but feels much better than when she first came to the ER at this time.   She is sexually active, uses condoms, has regular periods that started at the age of 73, last one around this time last month, denies chance of being pregnant, no prior miscarriages or  pregnancies and no hematemesis.   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  She reports eating a frozen dinner that may have given her food poisoning, but also admitted to missing some of her insulin doses.  She has had trouble accessing her insulin lately because her insurance coverage has stopped, and her mother has a difficult time paying for the insulin.  She reported taking Lantus 28 units at bedtime and Novolog 6 units with meals.  Her Hgb A1C was noted to be 12.3 during this admission, down from 17.2 months ago.  On admission, she was noted to have an anion gap of 30 with a bicarb of 11.  She was given normal saline boluses and started on an insulin drip.  When her blood sugar improved to <250, she was transitioned to D5 1/2 normal saline with potassium supplementation as needed.  Her gap was slowly closed over the course of two days, and she was transitioned to subcutaneous insulin. Her diet was advanced as tolerated, and she was discharged home on Lantus 15 units with 3 units Novalog with meals.  Diabetes management was consulted and provided the patient with insulin vouchers.  Case management arranged for Mr. Valentine to establish a PCP at the [place] where they can help her get access to the medications that she needs.  At discharge, her abdominal symptoms and light-headedness had completely resolved.  #Acute on chronic renal failure  [name] has chronic renal failure due to her diabetes with a baseline creatinine of 1.1. On admission, her creatinine  was elevated to 1.96.  This was thought to be due to dehydration from nausea and vomiting along with her DKA.  After rehydration with normal saline and correction of her anion gap, her creatinine came back to her baseline.  #Peripheral neuropathy [name] reports numbness and tingling in her lower extremities bilaterally to the mid calf since June.  She was told that this is most likely neuropathy due to poor blood sugar control.  It is likely that this will improve with better glucose control, so we decided not to initiate treatment at this time.  Amitriptyline or Lyrica could be considered as an outpatient if her neuropathy doesn't improve with better control of her blood sugar.  #Tachycardia [name] was noted to have tachycardia to the 120s on admission.  This improved slightly to the 100s with fluid replacement.  She reports having tachycardia at baseline, and this is consistent with her previous hospitalizations.  Discharge Vitals:   BP 106/63  Pulse 103  Temp(Src) 99 F (37.2 C) (Oral)  Resp 23  Ht 5\' 1"  (1.549 m)  Wt 110 lb 10.7 oz (50.2 kg)  BMI 20.92 kg/m2  SpO2 100%  LMP 01/09/2014  Discharge Labs:  Results for orders placed during the hospital encounter of 02/12/14 (from the past 24 hour(s))  BASIC METABOLIC PANEL     Status: Abnormal   Collection Time    02/14/14 11:30 AM      Result Value Ref Range   Sodium 135 (*) 137 - 147 mEq/L   Potassium 4.0  3.7 - 5.3 mEq/L   Chloride 104  96 - 112 mEq/L   CO2 17 (*) 19 - 32 mEq/L   Glucose, Bld 428 (*) 70 - 99 mg/dL   BUN 7  6 - 23 mg/dL   Creatinine, Ser 1.14 (*) 0.50 - 1.10 mg/dL   Calcium 9.1  8.4 - 10.5 mg/dL   GFR calc non Af Amer 68 (*) >90 mL/min  GFR calc Af Amer 78 (*) >90 mL/min   Anion gap 14  5 - 15  GLUCOSE, CAPILLARY     Status: Abnormal   Collection Time    02/14/14 12:43 PM      Result Value Ref Range   Glucose-Capillary 403 (*) 70 - 99 mg/dL   Comment 1 Documented in Chart     Comment 2  Notify RN    GLUCOSE, CAPILLARY     Status: Abnormal   Collection Time    02/14/14  1:10 PM      Result Value Ref Range   Glucose-Capillary 401 (*) 70 - 99 mg/dL   Comment 1 Documented in Chart     Comment 2 Notify RN    BASIC METABOLIC PANEL     Status: Abnormal   Collection Time    02/14/14  1:30 PM      Result Value Ref Range   Sodium 137  137 - 147 mEq/L   Potassium 4.4  3.7 - 5.3 mEq/L   Chloride 104  96 - 112 mEq/L   CO2 17 (*) 19 - 32 mEq/L   Glucose, Bld 410 (*) 70 - 99 mg/dL   BUN 7  6 - 23 mg/dL   Creatinine, Ser 1.17 (*) 0.50 - 1.10 mg/dL   Calcium 8.9  8.4 - 10.5 mg/dL   GFR calc non Af Amer 66 (*) >90 mL/min   GFR calc Af Amer 76 (*) >90 mL/min   Anion gap 16 (*) 5 - 15  GLUCOSE, CAPILLARY     Status: Abnormal   Collection Time    02/14/14  2:44 PM      Result Value Ref Range   Glucose-Capillary 266 (*) 70 - 99 mg/dL   Comment 1 Documented in Chart     Comment 2 Notify RN    GLUCOSE, CAPILLARY     Status: Abnormal   Collection Time    02/14/14  4:11 PM      Result Value Ref Range   Glucose-Capillary 167 (*) 70 - 99 mg/dL   Comment 1 Documented in Chart     Comment 2 Notify RN    GLUCOSE, CAPILLARY     Status: Abnormal   Collection Time    02/14/14  5:52 PM      Result Value Ref Range   Glucose-Capillary 136 (*) 70 - 99 mg/dL   Comment 1 Documented in Chart     Comment 2 Notify RN    BASIC METABOLIC PANEL     Status: Abnormal   Collection Time    02/14/14  7:00 PM      Result Value Ref Range   Sodium 137  137 - 147 mEq/L   Potassium 3.7  3.7 - 5.3 mEq/L   Chloride 107  96 - 112 mEq/L   CO2 20  19 - 32 mEq/L   Glucose, Bld 164 (*) 70 - 99 mg/dL   BUN 6  6 - 23 mg/dL   Creatinine, Ser 1.08  0.50 - 1.10 mg/dL   Calcium 8.9  8.4 - 10.5 mg/dL   GFR calc non Af Amer 72 (*) >90 mL/min   GFR calc Af Amer 84 (*) >90 mL/min   Anion gap 10  5 - 15  GLUCOSE, CAPILLARY     Status: Abnormal   Collection Time    02/14/14  7:31 PM      Result Value Ref Range     Glucose-Capillary 158 (*) 70 - 99 mg/dL  GLUCOSE, CAPILLARY     Status: Abnormal   Collection Time    02/14/14 11:46 PM      Result Value Ref Range   Glucose-Capillary 156 (*) 70 - 99 mg/dL  BASIC METABOLIC PANEL     Status: Abnormal   Collection Time    02/15/14  1:05 AM      Result Value Ref Range   Sodium 139  137 - 147 mEq/L   Potassium 3.7  3.7 - 5.3 mEq/L   Chloride 109  96 - 112 mEq/L   CO2 21  19 - 32 mEq/L   Glucose, Bld 119 (*) 70 - 99 mg/dL   BUN 6  6 - 23 mg/dL   Creatinine, Ser 1.18 (*) 0.50 - 1.10 mg/dL   Calcium 8.3 (*) 8.4 - 10.5 mg/dL   GFR calc non Af Amer 65 (*) >90 mL/min   GFR calc Af Amer 75 (*) >90 mL/min   Anion gap 9  5 - 15  GLUCOSE, CAPILLARY     Status: Abnormal   Collection Time    02/15/14  4:31 AM      Result Value Ref Range   Glucose-Capillary 161 (*) 70 - 99 mg/dL  GLUCOSE, CAPILLARY     Status: Abnormal   Collection Time    02/15/14  8:10 AM      Result Value Ref Range   Glucose-Capillary 110 (*) 70 - 99 mg/dL    Signed: Arman Filter, MD 02/15/2014, 10:48 AM    Services Ordered on Discharge: None. Equipment Ordered on Discharge: None.

## 2014-02-15 NOTE — Progress Notes (Signed)
Spoke with Dr. Trudee Kuster via phone, advised LBM 02/09/14, requested medication for assistance in moving bowels. Orders to come today per Dr. Trudee Kuster.

## 2014-02-15 NOTE — Progress Notes (Signed)
Reviewed dc instructions with pt, she verbalized understanding along with mom. All belongings returned to pt, VSS. Pt discharged to home with mom.

## 2014-02-15 NOTE — Progress Notes (Addendum)
Inpatient Diabetes Program Recommendations  AACE/ADA: New Consensus Statement on Inpatient Glycemic Control (2013)  Target Ranges:  Prepandial:   less than 140 mg/dL      Peak postprandial:   less than 180 mg/dL (1-2 hours)      Critically ill patients:  140 - 180 mg/dL   Reason for Assessment:  CBG's improved over night  Diabetes history: Type 1 diabetes Outpatient Diabetes medications: Lantus 28 units q HS, Novolog 6 units tid with meals. Current orders for Inpatient glycemic control: Lantus 15 units q HS, Novolog sensitive q 4 hours  Note CBG's improved.  Patient did receive Novolog correction q 4 hours last night.  Please consider changing correction schedule to tid with meals with HS/bedtime scale.  Also consider adding Novolog meal coverage 3 units tid with meals (hold if patient eats less than 50%).    Will talk with patient again today and give her vouchers for insulin.  Will need close follow-up at [facility].    Thanks, Adah Perl, RN, BC-ADM Inpatient Diabetes Coordinator Pager [phone]     Addendum:  Spoke with patient and discussed again the importance of getting CBG's in control.  Discussed the goal for A1C levels and reduction of complications.  She needs "problem solving" skills regarding decreasing and increasing insulin doses, etc.  She states that her A1C has been as low as 10% but never at goal.  She worries about the amount of money that her Mom spends on her insulin.  She reports a 15 pound weight loss.  Discussed the action of insulin and the effects of high CBG's on weight loss.

## 2014-02-15 NOTE — Discharge Instructions (Addendum)
·   Thank you for allowing [place] to be involved in your healthcare while you were hospitalized at [facility].   Please note that there have been changes to your home medications.  --> PLEASE LOOK AT YOUR DISCHARGE MEDICATION LIST FOR DETAILS.   Please call your PCP if you have any questions or concerns, or any difficulty getting any of your medications.  Please return to the ER if you have worsening of your symptoms or new severe symptoms arise.  Make sure to get the insulin vouchers from the diabetes coordinator before your leave.  Pick up you insulin.  We recommend that you use your Lantus in the morning so it is easier to use.  Check your sugar 4 times a day so you can see if your new insulin regimen is working.  Make sure to establish with a new PCP in the [place], they will be able to help you get your insulin until you get your insurance.  Call our clinic at [phone] if you have any trouble getting in to see a doctor or if your blood sugars start running too high and are not responding to your insulin regimen.

## 2014-02-19 NOTE — ED Provider Notes (Signed)
I saw and evaluated the patient, reviewed the resident's note and I agree with the findings and plan.   EKG Interpretation   Date/Time:  Tuesday February 12 2014 09:32:42 EDT Ventricular Rate:  122 PR Interval:  113 QRS Duration: 76 QT Interval:  327 QTC Calculation: 466 R Axis:   84 Text Interpretation:  Sinus tachycardia Borderline T wave abnormalities  Baseline wander in lead(s) V1 No old tracing to compare Confirmed by  Rupinder Livingston  MD, Kahlie Deutscher ([id number]) on 02/12/2014 9:59:06 AM        Patient with recurrent DKA, pH 7.25. Given fluid boluses and will start insulin drip and admit to medicine.  CRITICAL CARE Performed by: Sherwood Gambler T   Total critical care time: 30 minutes  Critical care time was exclusive of separately billable procedures and treating other patients.  Critical care was necessary to treat or prevent imminent or life-threatening deterioration.  Critical care was time spent personally by me on the following activities: development of treatment plan with patient and/or surrogate as well as nursing, discussions with consultants, evaluation of patient's response to treatment, examination of patient, obtaining history from patient or surrogate, ordering and performing treatments and interventions, ordering and review of laboratory studies, ordering and review of radiographic studies, pulse oximetry and re-evaluation of patient's condition.   Ephraim Hamburger, MD 02/19/14 [phone]

## 2014-02-22 ENCOUNTER — Ambulatory Visit: Payer: No Typology Code available for payment source | Admitting: Internal Medicine

## 2014-03-06 ENCOUNTER — Ambulatory Visit: Payer: Medicaid Other | Attending: Internal Medicine | Admitting: Internal Medicine

## 2014-03-06 ENCOUNTER — Encounter: Payer: Self-pay | Admitting: Internal Medicine

## 2014-03-06 VITALS — BP 120/80 | HR 110 | Temp 98.1°F | Resp 18 | Ht 62.0 in | Wt 109.6 lb

## 2014-03-06 DIAGNOSIS — R945 Abnormal results of liver function studies: Secondary | ICD-10-CM

## 2014-03-06 DIAGNOSIS — Z794 Long term (current) use of insulin: Secondary | ICD-10-CM | POA: Insufficient documentation

## 2014-03-06 DIAGNOSIS — E089 Diabetes mellitus due to underlying condition without complications: Secondary | ICD-10-CM

## 2014-03-06 DIAGNOSIS — N179 Acute kidney failure, unspecified: Secondary | ICD-10-CM | POA: Diagnosis not present

## 2014-03-06 DIAGNOSIS — E1142 Type 2 diabetes mellitus with diabetic polyneuropathy: Secondary | ICD-10-CM | POA: Insufficient documentation

## 2014-03-06 DIAGNOSIS — E139 Other specified diabetes mellitus without complications: Secondary | ICD-10-CM

## 2014-03-06 DIAGNOSIS — E1049 Type 1 diabetes mellitus with other diabetic neurological complication: Secondary | ICD-10-CM | POA: Insufficient documentation

## 2014-03-06 DIAGNOSIS — R7989 Other specified abnormal findings of blood chemistry: Secondary | ICD-10-CM | POA: Diagnosis not present

## 2014-03-06 DIAGNOSIS — E1065 Type 1 diabetes mellitus with hyperglycemia: Principal | ICD-10-CM

## 2014-03-06 DIAGNOSIS — Z139 Encounter for screening, unspecified: Secondary | ICD-10-CM

## 2014-03-06 DIAGNOSIS — N189 Chronic kidney disease, unspecified: Secondary | ICD-10-CM

## 2014-03-06 LAB — COMPLETE METABOLIC PANEL WITH GFR
ALBUMIN: 4 g/dL (ref 3.5–5.2)
AST: 11 U/L (ref 0–37)
Alkaline Phosphatase: 95 U/L (ref 39–117)
BILIRUBIN TOTAL: 0.4 mg/dL (ref 0.2–1.2)
BUN: 11 mg/dL (ref 6–23)
CALCIUM: 10.1 mg/dL (ref 8.4–10.5)
CHLORIDE: 101 meq/L (ref 96–112)
CO2: 26 mEq/L (ref 19–32)
Creat: 1.15 mg/dL — ABNORMAL HIGH (ref 0.50–1.10)
GFR, EST AFRICAN AMERICAN: 78 mL/min
GFR, Est Non African American: 68 mL/min
Glucose, Bld: 301 mg/dL — ABNORMAL HIGH (ref 70–99)
Potassium: 4.7 mEq/L (ref 3.5–5.3)
Sodium: 136 mEq/L (ref 135–145)
Total Protein: 7.4 g/dL (ref 6.0–8.3)

## 2014-03-06 LAB — GLUCOSE, POCT (MANUAL RESULT ENTRY): POC GLUCOSE: 260 mg/dL — AB (ref 70–99)

## 2014-03-06 MED ORDER — GABAPENTIN 100 MG PO CAPS: 100.0000 mg | ORAL_CAPSULE | Freq: Every day | ORAL | Status: DC

## 2014-03-06 NOTE — Progress Notes (Signed)
Pt here for hospital follow-up Pt is diabetic and complains of aching nerve pain in both calves

## 2014-03-06 NOTE — Progress Notes (Signed)
Patient Demographics  [name], is a 23 y.o. female  [id number]  [id number]  DOB - 31-Oct-1990  CC:  Chief Complaint  Patient presents with  . Hospitalization Follow-up       HPI: [name] is a 23 y.o. female here today to establish medical care. Patient has history of type 1 diabetes, she was recently hospitalized with nausea vomiting dizziness, EMR reviewed her patient was found to be in DKA, she was treated with IV normal saline insulin drip her, she symptomatically improved and then she was transitioned to subcutaneous insulin currently patient is on Lantus 15 units each bedtime and NovoLog 3 units with meals, blood work also showed acute on chronic renal insufficiency which improved after IV hydration patient also has peripheral neuropathy secondary to uncontrolled diabetes, last hemoglobin A1c was 12.3%. As per patient prior to this hospitalization she used to be on NovoLog 70/30 but now since she has been switched to Lantus she reports improvement in her sugar level but still her fasting sugar is more than 1 50 mg/dL and her postprandial blood sugars more than 2 50 mg/dL. Patient has No headache, No chest pain, No abdominal pain - No Nausea, No new weakness tingling or numbness, No Cough - SOB.  No Known Allergies Past Medical History  Diagnosis Date  . Diabetes mellitus type 1     2005 dx [facility]   Current Outpatient Prescriptions on File Prior to Visit  Medication Sig Dispense Refill  . ibuprofen (ADVIL,MOTRIN) 200 MG tablet Take 400 mg by mouth as needed for headache or cramping.       . insulin aspart (NOVOLOG) 100 UNIT/ML injection Inject 3 Units into the skin 3 (three) times daily with meals. Do not use if your blood sugar is <100 or you eat <50% of your meal.  10 mL  3  . insulin glargine (LANTUS) 100 UNIT/ML injection Inject 0.15 mLs (15 Units total) into the skin every morning.  10 mL  3   No current facility-administered  medications on file prior to visit.   Family History  Problem Relation Age of Onset  . Diabetes Maternal Grandmother   . Stroke Maternal Grandmother   . Cancer      aunt, passed last month, ?RCC   History   Social History  . Marital Status: Single    Spouse Name: N/A    Number of Children: N/A  . Years of Education: N/A   Occupational History  . student     business administration   Social History Main Topics  . Smoking status: Never Smoker   . Smokeless tobacco: Not on file  . Alcohol Use: Yes     Comment: occasional  . Drug Use: No  . Sexual Activity: Yes    Partners: Male    Birth Control/ Protection: Condom     Comment: last encounter 4 months ago   Other Topics Concern  . Not on file   Social History Narrative  . No narrative on file    Review of Systems: Constitutional: Negative for fever, chills, diaphoresis, activity change, appetite change and fatigue. HENT: Negative for ear pain, nosebleeds, congestion, facial swelling, rhinorrhea, neck pain, neck stiffness and ear discharge.  Eyes: Negative for pain, discharge, redness, itching and visual disturbance. Respiratory: Negative for cough, choking, chest tightness, shortness of breath, wheezing and stridor.  Cardiovascular: Negative for chest pain, palpitations and leg swelling. Gastrointestinal: Negative for abdominal distention. Genitourinary: Negative for dysuria, urgency, frequency, hematuria,  flank pain, decreased urine volume, difficulty urinating and dyspareunia.  Musculoskeletal: Negative for back pain, joint swelling, arthralgia and gait problem. Neurological: Negative for dizziness, tremors, seizures, syncope, facial asymmetry, speech difficulty, weakness, light-headedness, numbness and headaches.  Hematological: Negative for adenopathy. Does not bruise/bleed easily. Psychiatric/Behavioral: Negative for hallucinations, behavioral problems, confusion, dysphoric mood, decreased concentration and  agitation.    Objective:   Filed Vitals:   03/06/14 1122  BP: 120/80  Pulse:   Temp:   Resp:     Physical Exam: Constitutional: Patient appears well-developed and well-nourished. No distress. HENT: Normocephalic, atraumatic, External right and left ear normal. Oropharynx is clear and moist.  Eyes: Conjunctivae and EOM are normal. PERRLA, no scleral icterus. Neck: Normal ROM. Neck supple. No JVD. No tracheal deviation. No thyromegaly. CVS: RRR, S1/S2 +, no murmurs, no gallops, no carotid bruit.  Pulmonary: Effort and breath sounds normal, no stridor, rhonchi, wheezes, rales.  Abdominal: Soft. BS +, no distension, tenderness, rebound or guarding.  Musculoskeletal: Normal range of motion. No edema and no tenderness. Bilateral feet no ulcer or callus Neuro: Alert. Normal reflexes, muscle tone coordination. No cranial nerve deficit. Skin: Skin is warm and dry. No rash noted. Not diaphoretic. No erythema. No pallor. Psychiatric: Normal mood and affect. Behavior, judgment, thought content normal.  Lab Results  Component Value Date   WBC 6.0 02/13/2014   HGB 9.6* 02/13/2014   HCT 29.3* 02/13/2014   MCV 80.7 02/13/2014   PLT 306 02/13/2014   Lab Results  Component Value Date   CREATININE 1.18* 02/15/2014   BUN 6 02/15/2014   NA 139 02/15/2014   K 3.7 02/15/2014   CL 109 02/15/2014   CO2 21 02/15/2014    Lab Results  Component Value Date   HGBA1C 12.3* 02/14/2014   Lipid Panel     Component Value Date/Time   CHOL 186 12/06/2013 1325   TRIG 101 12/06/2013 1325   HDL 97 12/06/2013 1325   CHOLHDL 1.9 12/06/2013 1325   VLDL 20 12/06/2013 1325   LDLCALC 69 12/06/2013 1325       Assessment and plan:    2. 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  - COMPLETE METABOLIC PANEL WITH GFR - Prescribed her gabapentin (NEURONTIN) 100 MG capsule; Take 1 capsule (100 mg total) by mouth at bedtime.  Dispense: 90 capsule; Refill: 3  3. Acute on chronic renal failure Will repeat blood chemistry.  4. Abnormal LFTs Hepatitis panel is negative her abdominal ultrasound reported hepatosteatosis.  5. Screening  - Vit D  25 hydroxy (rtn osteoporosis monitoring)  Patient declined for flu shot   Return in about 3 months (around 06/05/2014) for diabetes.   Lorayne Marek, MD

## 2014-03-06 NOTE — Patient Instructions (Signed)
Diabetes Mellitus and Food It is important for you to manage your blood sugar (glucose) level. Your blood glucose level can be greatly affected by what you eat. Eating healthier foods in the appropriate amounts throughout the day at about the same time each day will help you control your blood glucose level. It can also help slow or prevent worsening of your diabetes mellitus. Healthy eating may even help you improve the level of your blood pressure and reach or maintain a healthy weight.  HOW CAN FOOD AFFECT ME? Carbohydrates Carbohydrates affect your blood glucose level more than any other type of food. Your dietitian will help you determine how many carbohydrates to eat at each meal and teach you how to count carbohydrates. Counting carbohydrates is important to keep your blood glucose at a healthy level, especially if you are using insulin or taking certain medicines for diabetes mellitus. Alcohol Alcohol can cause sudden decreases in blood glucose (hypoglycemia), especially if you use insulin or take certain medicines for diabetes mellitus. Hypoglycemia can be a life-threatening condition. Symptoms of hypoglycemia (sleepiness, dizziness, and disorientation) are similar to symptoms of having too much alcohol.  If your health care provider has given you approval to drink alcohol, do so in moderation and use the following guidelines:  Women should not have more than one drink per day, and men should not have more than two drinks per day. One drink is equal to:  12 oz of beer.  5 oz of wine.  1 oz of hard liquor.  Do not drink on an empty stomach.  Keep yourself hydrated. Have water, diet soda, or unsweetened iced tea.  Regular soda, juice, and other mixers might contain a lot of carbohydrates and should be counted. WHAT FOODS ARE NOT RECOMMENDED? As you make food choices, it is important to remember that all foods are not the same. Some foods have fewer nutrients per serving than other  foods, even though they might have the same number of calories or carbohydrates. It is difficult to get your body what it needs when you eat foods with fewer nutrients. Examples of foods that you should avoid that are high in calories and carbohydrates but low in nutrients include:  Trans fats (most processed foods list trans fats on the Nutrition Facts label).  Regular soda.  Juice.  Candy.  Sweets, such as cake, pie, doughnuts, and cookies.  Fried foods. WHAT FOODS CAN I EAT? Have nutrient-rich foods, which will nourish your body and keep you healthy. The food you should eat also will depend on several factors, including:  The calories you need.  The medicines you take.  Your weight.  Your blood glucose level.  Your blood pressure level.  Your cholesterol level. You also should eat a variety of foods, including:  Protein, such as meat, poultry, fish, tofu, nuts, and seeds (lean animal proteins are best).  Fruits.  Vegetables.  Dairy products, such as milk, cheese, and yogurt (low fat is best).  Breads, grains, pasta, cereal, rice, and beans.  Fats such as olive oil, trans fat-free margarine, canola oil, avocado, and olives. DOES EVERYONE WITH DIABETES MELLITUS HAVE THE SAME MEAL PLAN? Because every person with diabetes mellitus is different, there is not one meal plan that works for everyone. It is very important that you meet with a dietitian who will help you create a meal plan that is just right for you. Document Released: 03/04/2005 Document Revised: 06/12/2013 Document Reviewed: 05/04/2013 Exit[employer] Patient Information 2015 [employer]. This   information is not intended to replace advice given to you by your health care provider. Make sure you discuss any questions you have with your health care provider.  

## 2014-03-07 LAB — VITAMIN D 25 HYDROXY (VIT D DEFICIENCY, FRACTURES): VIT D 25 HYDROXY: 13 ng/mL — AB (ref 30–89)

## 2014-03-08 ENCOUNTER — Telehealth: Payer: Self-pay | Admitting: *Deleted

## 2014-03-08 NOTE — Telephone Encounter (Signed)
Left message to return my call.  

## 2014-03-21 DIAGNOSIS — I469 Cardiac arrest, cause unspecified: Secondary | ICD-10-CM

## 2014-03-21 DIAGNOSIS — I639 Cerebral infarction, unspecified: Secondary | ICD-10-CM

## 2014-03-21 HISTORY — DX: Cerebral infarction, unspecified: I63.9

## 2014-03-21 HISTORY — DX: Cardiac arrest, cause unspecified: I46.9

## 2014-04-07 ENCOUNTER — Encounter (HOSPITAL_COMMUNITY): Payer: Self-pay | Admitting: Emergency Medicine

## 2014-04-07 ENCOUNTER — Inpatient Hospital Stay (HOSPITAL_COMMUNITY)
Admission: EM | Admit: 2014-04-07 | Discharge: 2014-04-16 | DRG: 637 | Disposition: A | Discharge status: Home Health | Payer: Medicaid Other | Attending: Internal Medicine | Admitting: Internal Medicine

## 2014-04-07 ENCOUNTER — Emergency Department (HOSPITAL_COMMUNITY): Payer: Medicaid Other

## 2014-04-07 DIAGNOSIS — I10 Essential (primary) hypertension: Secondary | ICD-10-CM | POA: Diagnosis present

## 2014-04-07 DIAGNOSIS — K5900 Constipation, unspecified: Secondary | ICD-10-CM | POA: Diagnosis not present

## 2014-04-07 DIAGNOSIS — I4940 Unspecified premature depolarization: Secondary | ICD-10-CM | POA: Diagnosis present

## 2014-04-07 DIAGNOSIS — R059 Cough, unspecified: Secondary | ICD-10-CM

## 2014-04-07 DIAGNOSIS — Z9119 Patient's noncompliance with other medical treatment and regimen: Secondary | ICD-10-CM

## 2014-04-07 DIAGNOSIS — R7989 Other specified abnormal findings of blood chemistry: Secondary | ICD-10-CM

## 2014-04-07 DIAGNOSIS — E1029 Type 1 diabetes mellitus with other diabetic kidney complication: Secondary | ICD-10-CM

## 2014-04-07 DIAGNOSIS — E1040 Type 1 diabetes mellitus with diabetic neuropathy, unspecified: Secondary | ICD-10-CM | POA: Diagnosis present

## 2014-04-07 DIAGNOSIS — Z9114 Patient's other noncompliance with medication regimen: Secondary | ICD-10-CM | POA: Diagnosis present

## 2014-04-07 DIAGNOSIS — N189 Chronic kidney disease, unspecified: Secondary | ICD-10-CM | POA: Diagnosis not present

## 2014-04-07 DIAGNOSIS — I469 Cardiac arrest, cause unspecified: Secondary | ICD-10-CM

## 2014-04-07 DIAGNOSIS — G931 Anoxic brain damage, not elsewhere classified: Secondary | ICD-10-CM | POA: Diagnosis present

## 2014-04-07 DIAGNOSIS — Z9229 Personal history of other drug therapy: Secondary | ICD-10-CM | POA: Diagnosis not present

## 2014-04-07 DIAGNOSIS — R52 Pain, unspecified: Secondary | ICD-10-CM

## 2014-04-07 DIAGNOSIS — J9690 Respiratory failure, unspecified, unspecified whether with hypoxia or hypercapnia: Secondary | ICD-10-CM | POA: Diagnosis not present

## 2014-04-07 DIAGNOSIS — N179 Acute kidney failure, unspecified: Secondary | ICD-10-CM | POA: Diagnosis present

## 2014-04-07 DIAGNOSIS — Z823 Family history of stroke: Secondary | ICD-10-CM | POA: Diagnosis not present

## 2014-04-07 DIAGNOSIS — I639 Cerebral infarction, unspecified: Secondary | ICD-10-CM

## 2014-04-07 DIAGNOSIS — J9601 Acute respiratory failure with hypoxia: Secondary | ICD-10-CM

## 2014-04-07 DIAGNOSIS — J690 Pneumonitis due to inhalation of food and vomit: Secondary | ICD-10-CM | POA: Diagnosis not present

## 2014-04-07 DIAGNOSIS — E875 Hyperkalemia: Secondary | ICD-10-CM | POA: Diagnosis present

## 2014-04-07 DIAGNOSIS — J9600 Acute respiratory failure, unspecified whether with hypoxia or hypercapnia: Secondary | ICD-10-CM | POA: Diagnosis present

## 2014-04-07 DIAGNOSIS — E861 Hypovolemia: Secondary | ICD-10-CM | POA: Diagnosis not present

## 2014-04-07 DIAGNOSIS — E876 Hypokalemia: Secondary | ICD-10-CM | POA: Diagnosis not present

## 2014-04-07 DIAGNOSIS — I959 Hypotension, unspecified: Secondary | ICD-10-CM | POA: Diagnosis present

## 2014-04-07 DIAGNOSIS — R05 Cough: Secondary | ICD-10-CM

## 2014-04-07 DIAGNOSIS — E1011 Type 1 diabetes mellitus with ketoacidosis with coma: Secondary | ICD-10-CM

## 2014-04-07 DIAGNOSIS — R509 Fever, unspecified: Secondary | ICD-10-CM

## 2014-04-07 DIAGNOSIS — Z833 Family history of diabetes mellitus: Secondary | ICD-10-CM

## 2014-04-07 DIAGNOSIS — R0789 Other chest pain: Secondary | ICD-10-CM

## 2014-04-07 DIAGNOSIS — Z91199 Patient's noncompliance with other medical treatment and regimen due to unspecified reason: Secondary | ICD-10-CM

## 2014-04-07 DIAGNOSIS — D6489 Other specified anemias: Secondary | ICD-10-CM | POA: Diagnosis not present

## 2014-04-07 DIAGNOSIS — D509 Iron deficiency anemia, unspecified: Secondary | ICD-10-CM | POA: Diagnosis present

## 2014-04-07 DIAGNOSIS — IMO0002 Reserved for concepts with insufficient information to code with codable children: Secondary | ICD-10-CM

## 2014-04-07 DIAGNOSIS — R531 Weakness: Secondary | ICD-10-CM

## 2014-04-07 DIAGNOSIS — R195 Other fecal abnormalities: Secondary | ICD-10-CM | POA: Diagnosis not present

## 2014-04-07 DIAGNOSIS — E1010 Type 1 diabetes mellitus with ketoacidosis without coma: Secondary | ICD-10-CM | POA: Diagnosis not present

## 2014-04-07 DIAGNOSIS — E1065 Type 1 diabetes mellitus with hyperglycemia: Secondary | ICD-10-CM

## 2014-04-07 DIAGNOSIS — E1110 Type 2 diabetes mellitus with ketoacidosis without coma: Secondary | ICD-10-CM | POA: Diagnosis present

## 2014-04-07 DIAGNOSIS — E1140 Type 2 diabetes mellitus with diabetic neuropathy, unspecified: Secondary | ICD-10-CM | POA: Diagnosis present

## 2014-04-07 DIAGNOSIS — M25469 Effusion, unspecified knee: Secondary | ICD-10-CM

## 2014-04-07 DIAGNOSIS — R945 Abnormal results of liver function studies: Secondary | ICD-10-CM

## 2014-04-07 DIAGNOSIS — M6289 Other specified disorders of muscle: Secondary | ICD-10-CM | POA: Diagnosis not present

## 2014-04-07 LAB — POCT I-STAT, CHEM 8
BUN: 50 mg/dL — ABNORMAL HIGH (ref 6–23)
CALCIUM ION: 1.2 mmol/L (ref 1.12–1.23)
CREATININE: 2.7 mg/dL — AB (ref 0.50–1.10)
Chloride: 107 mEq/L (ref 96–112)
Glucose, Bld: >700 mg/dL (ref 70–99)
HCT: 31 % — ABNORMAL LOW (ref 36.0–46.0)
Hemoglobin: 10.5 g/dL — ABNORMAL LOW (ref 12.0–15.0)
Potassium: 3.8 mEq/L (ref 3.7–5.3)
Sodium: 134 mEq/L — ABNORMAL LOW (ref 137–147)
TCO2: 10 mmol/L (ref 0–100)

## 2014-04-07 LAB — BASIC METABOLIC PANEL
ANION GAP: 33 — AB (ref 5–15)
ANION GAP: 34 — AB (ref 5–15)
Anion gap: 30 — ABNORMAL HIGH (ref 5–15)
Anion gap: 23 — ABNORMAL HIGH (ref 5–15)
BUN: 61 mg/dL — AB (ref 6–23)
BUN: 60 mg/dL — AB (ref 6–23)
BUN: 58 mg/dL — AB (ref 6–23)
BUN: 53 mg/dL — AB (ref 6–23)
CALCIUM: 7.3 mg/dL — AB (ref 8.4–10.5)
CALCIUM: 7.4 mg/dL — AB (ref 8.4–10.5)
CO2: 9 mEq/L — CL (ref 19–32)
CO2: 7 mEq/L — CL (ref 19–32)
CO2: 10 mEq/L — CL (ref 19–32)
CO2: 14 mEq/L — ABNORMAL LOW (ref 19–32)
CREATININE: 2.56 mg/dL — AB (ref 0.50–1.10)
CREATININE: 2.53 mg/dL — AB (ref 0.50–1.10)
Calcium: 7.7 mg/dL — ABNORMAL LOW (ref 8.4–10.5)
Calcium: 7.4 mg/dL — ABNORMAL LOW (ref 8.4–10.5)
Chloride: 94 mEq/L — ABNORMAL LOW (ref 96–112)
Chloride: 93 mEq/L — ABNORMAL LOW (ref 96–112)
Chloride: 97 mEq/L (ref 96–112)
Chloride: 103 mEq/L (ref 96–112)
Creatinine, Ser: 2.57 mg/dL — ABNORMAL HIGH (ref 0.50–1.10)
Creatinine, Ser: 2.41 mg/dL — ABNORMAL HIGH (ref 0.50–1.10)
GFR calc Af Amer: 29 mL/min — ABNORMAL LOW (ref >90)
GFR calc Af Amer: 32 mL/min — ABNORMAL LOW (ref >90)
GFR calc non Af Amer: 26 mL/min — ABNORMAL LOW (ref >90)
GFR calc non Af Amer: 27 mL/min — ABNORMAL LOW (ref >90)
GFR, EST AFRICAN AMERICAN: 29 mL/min — AB (ref >90)
GFR, EST AFRICAN AMERICAN: 30 mL/min — AB (ref >90)
GFR, EST NON AFRICAN AMERICAN: 25 mL/min — AB (ref >90)
GFR, EST NON AFRICAN AMERICAN: 25 mL/min — AB (ref >90)
Glucose, Bld: 1274 mg/dL (ref 70–99)
Glucose, Bld: 1371 mg/dL (ref 70–99)
Glucose, Bld: 1110 mg/dL (ref 70–99)
Glucose, Bld: 797 mg/dL (ref 70–99)
POTASSIUM: 4.3 meq/L (ref 3.7–5.3)
Potassium: 4.4 mEq/L (ref 3.7–5.3)
Potassium: 3.8 mEq/L (ref 3.7–5.3)
Potassium: 3.7 mEq/L (ref 3.7–5.3)
Sodium: 136 mEq/L — ABNORMAL LOW (ref 137–147)
Sodium: 134 mEq/L — ABNORMAL LOW (ref 137–147)
Sodium: 137 mEq/L (ref 137–147)
Sodium: 140 mEq/L (ref 137–147)

## 2014-04-07 LAB — POCT I-STAT 3, VENOUS BLOOD GAS (G3P V)
Acid-base deficit: 16 mmol/L — ABNORMAL HIGH (ref 0.0–2.0)
Bicarbonate: 9.8 mEq/L — ABNORMAL LOW (ref 20.0–24.0)
O2 SAT: 63 %
TCO2: 10 mmol/L (ref 0–100)
pCO2, Ven: 22.6 mmHg — ABNORMAL LOW (ref 45.0–50.0)
pH, Ven: 7.238 — ABNORMAL LOW (ref 7.250–7.300)
pO2, Ven: 35 mmHg (ref 30.0–45.0)

## 2014-04-07 LAB — I-STAT ARTERIAL BLOOD GAS, ED
ACID-BASE DEFICIT: 21 mmol/L — AB (ref 0.0–2.0)
Bicarbonate: 9.2 mEq/L — ABNORMAL LOW (ref 20.0–24.0)
O2 Saturation: 100 %
PCO2 ART: 36.6 mmHg (ref 35.0–45.0)
PO2 ART: 567 mmHg — AB (ref 80.0–100.0)
Patient temperature: 98.6
TCO2: 10 mmol/L (ref 0–100)
pH, Arterial: 7.006 — CL (ref 7.350–7.450)

## 2014-04-07 LAB — URINALYSIS, ROUTINE W REFLEX MICROSCOPIC
Bilirubin Urine: NEGATIVE
KETONES UR: 40 mg/dL — AB
LEUKOCYTES UA: NEGATIVE
NITRITE: NEGATIVE
PH: 5 (ref 5.0–8.0)
Protein, ur: NEGATIVE mg/dL
Specific Gravity, Urine: 1.025 (ref 1.005–1.030)
Urobilinogen, UA: 0.2 mg/dL (ref 0.0–1.0)

## 2014-04-07 LAB — I-STAT CHEM 8, ED
BUN: 74 mg/dL — AB (ref 6–23)
CALCIUM ION: 2.15 mmol/L — AB (ref 1.12–1.23)
CHLORIDE: 92 meq/L — AB (ref 96–112)
Creatinine, Ser: 3.4 mg/dL — ABNORMAL HIGH (ref 0.50–1.10)
HCT: 34 % — ABNORMAL LOW (ref 36.0–46.0)
Hemoglobin: 11.6 g/dL — ABNORMAL LOW (ref 12.0–15.0)
Potassium: 8 mEq/L (ref 3.7–5.3)
Sodium: 122 mEq/L — ABNORMAL LOW (ref 137–147)
TCO2: 16 mmol/L (ref 0–100)

## 2014-04-07 LAB — CBG MONITORING, ED
Glucose-Capillary: >600 mg/dL (ref 70–99)
Glucose-Capillary: >600 mg/dL (ref 70–99)

## 2014-04-07 LAB — URINE MICROSCOPIC-ADD ON

## 2014-04-07 LAB — I-STAT VENOUS BLOOD GAS, ED
Acid-base deficit: 23 mmol/L — ABNORMAL HIGH (ref 0.0–2.0)
Bicarbonate: 10.3 mEq/L — ABNORMAL LOW (ref 20.0–24.0)
O2 Saturation: 66 %
PH VEN: 6.896 — AB (ref 7.250–7.300)
PO2 VEN: 57 mmHg — AB (ref 30.0–45.0)
TCO2: 12 mmol/L (ref 0–100)
pCO2, Ven: 53.1 mmHg — ABNORMAL HIGH (ref 45.0–50.0)

## 2014-04-07 LAB — COMPREHENSIVE METABOLIC PANEL
ALBUMIN: 2.3 g/dL — AB (ref 3.5–5.2)
ALT: 198 U/L — ABNORMAL HIGH (ref 0–35)
AST: 337 U/L — AB (ref 0–37)
Alkaline Phosphatase: 113 U/L (ref 39–117)
Anion gap: 39 — ABNORMAL HIGH (ref 5–15)
BUN: 71 mg/dL — AB (ref 6–23)
CO2: 10 mEq/L — CL (ref 19–32)
CREATININE: 3.11 mg/dL — AB (ref 0.50–1.10)
Chloride: 73 mEq/L — ABNORMAL LOW (ref 96–112)
GFR calc Af Amer: 23 mL/min — ABNORMAL LOW (ref >90)
GFR calc non Af Amer: 20 mL/min — ABNORMAL LOW (ref >90)
Glucose, Bld: 2000 mg/dL (ref 70–99)
Potassium: >7.7 mEq/L (ref 3.7–5.3)
Sodium: 122 mEq/L — ABNORMAL LOW (ref 137–147)
TOTAL PROTEIN: 5.7 g/dL — AB (ref 6.0–8.3)
Total Bilirubin: <0.2 mg/dL — ABNORMAL LOW (ref 0.3–1.2)

## 2014-04-07 LAB — CBC WITH DIFFERENTIAL/PLATELET
BASOS ABS: 0 10*3/uL (ref 0.0–0.1)
BASOS PCT: 0 % (ref 0–1)
EOS ABS: 0 10*3/uL (ref 0.0–0.7)
EOS PCT: 0 % (ref 0–5)
HEMATOCRIT: 38.7 % (ref 36.0–46.0)
HEMOGLOBIN: 10.2 g/dL — AB (ref 12.0–15.0)
Lymphocytes Relative: 34 % (ref 12–46)
Lymphs Abs: 4 10*3/uL (ref 0.7–4.0)
MCH: 26.4 pg (ref 26.0–34.0)
MCHC: 26.4 g/dL — AB (ref 30.0–36.0)
MCV: 100.3 fL — AB (ref 78.0–100.0)
MONO ABS: 0.5 10*3/uL (ref 0.1–1.0)
MONOS PCT: 4 % (ref 3–12)
Neutro Abs: 7.1 10*3/uL (ref 1.7–7.7)
Neutrophils Relative %: 62 % (ref 43–77)
Platelets: 235 10*3/uL (ref 150–400)
RBC: 3.86 MIL/uL — ABNORMAL LOW (ref 3.87–5.11)
RDW: 13.6 % (ref 11.5–15.5)
WBC: 11.7 10*3/uL — ABNORMAL HIGH (ref 4.0–10.5)

## 2014-04-07 LAB — CBC
HCT: 25.5 % — ABNORMAL LOW (ref 36.0–46.0)
HEMOGLOBIN: 9.2 g/dL — AB (ref 12.0–15.0)
MCH: 26.4 pg (ref 26.0–34.0)
MCHC: 36.1 g/dL — AB (ref 30.0–36.0)
MCV: 73.1 fL — ABNORMAL LOW (ref 78.0–100.0)
PLATELETS: 210 10*3/uL (ref 150–400)
RBC: 3.49 MIL/uL — ABNORMAL LOW (ref 3.87–5.11)
WBC: 11.8 10*3/uL — ABNORMAL HIGH (ref 4.0–10.5)

## 2014-04-07 LAB — RAPID URINE DRUG SCREEN, HOSP PERFORMED
Amphetamines: NOT DETECTED
BENZODIAZEPINES: NOT DETECTED
Barbiturates: NOT DETECTED
Cocaine: NOT DETECTED
Opiates: NOT DETECTED
Tetrahydrocannabinol: NOT DETECTED

## 2014-04-07 LAB — LACTIC ACID, PLASMA: Lactic Acid, Venous: 4.1 mmol/L — ABNORMAL HIGH (ref 0.5–2.2)

## 2014-04-07 LAB — MRSA PCR SCREENING: MRSA by PCR: NEGATIVE

## 2014-04-07 LAB — PREGNANCY, URINE: PREG TEST UR: NEGATIVE

## 2014-04-07 LAB — I-STAT CG4 LACTIC ACID, ED: LACTIC ACID, VENOUS: 6.87 mmol/L — AB (ref 0.5–2.2)

## 2014-04-07 MED ORDER — POTASSIUM CHLORIDE 10 MEQ/100ML IV SOLN: 10.0000 meq | INTRAVENOUS | Status: AC

## 2014-04-07 MED ORDER — MIDAZOLAM HCL 2 MG/2ML IJ SOLN
INTRAMUSCULAR | Status: AC
  Administered 2014-04-07: 2 mg via INTRAVENOUS
  Filled 2014-04-07: qty 4

## 2014-04-07 MED ORDER — DEXTROSE 50 % IV SOLN
25.0000 mL | INTRAVENOUS | Status: DC | PRN
  Administered 2014-04-08: 13 mL via INTRAVENOUS
  Filled 2014-04-07 (×2): qty 50

## 2014-04-07 MED ORDER — PANTOPRAZOLE SODIUM 40 MG IV SOLR
40.0000 mg | INTRAVENOUS | Status: DC
  Administered 2014-04-07 – 2014-04-08 (×2): 40 mg via INTRAVENOUS
  Filled 2014-04-07 (×4): qty 40

## 2014-04-07 MED ORDER — SODIUM BICARBONATE 8.4 % IV SOLN
INTRAVENOUS | Status: DC
  Administered 2014-04-07: 16:00:00 via INTRAVENOUS
  Filled 2014-04-07 (×2): qty 850

## 2014-04-07 MED ORDER — INSULIN ASPART 100 UNIT/ML IV SOLN
INTRAVENOUS | Status: AC
  Filled 2014-04-07: qty 1

## 2014-04-07 MED ORDER — PHENYLEPHRINE HCL 10 MG/ML IJ SOLN
30.0000 ug/min | INTRAMUSCULAR | Status: DC
  Administered 2014-04-08: 30 ug/min via INTRAVENOUS
  Filled 2014-04-07: qty 1

## 2014-04-07 MED ORDER — SODIUM BICARBONATE 8.4 % IV SOLN
INTRAVENOUS | Status: AC | PRN
  Administered 2014-04-07 (×2): 50 meq via INTRAVENOUS

## 2014-04-07 MED ORDER — MIDAZOLAM HCL 2 MG/2ML IJ SOLN
INTRAMUSCULAR | Status: AC
  Administered 2014-04-07: 2 mg via INTRAVENOUS
  Filled 2014-04-07: qty 2

## 2014-04-07 MED ORDER — CALCIUM CHLORIDE 10 % IV SOLN
INTRAVENOUS | Status: AC | PRN
  Administered 2014-04-07: 1 g via INTRAVENOUS

## 2014-04-07 MED ORDER — INSULIN ASPART 100 UNIT/ML ~~LOC~~ SOLN
10.0000 [IU] | Freq: Once | SUBCUTANEOUS | Status: AC
  Administered 2014-04-07: 10 [IU] via SUBCUTANEOUS

## 2014-04-07 MED ORDER — STERILE WATER FOR INJECTION IV SOLN
Freq: Once | INTRAVENOUS | Status: AC
  Administered 2014-04-07: 15:00:00 via INTRAVENOUS
  Filled 2014-04-07: qty 850

## 2014-04-07 MED ORDER — SODIUM CHLORIDE 0.9 % IV SOLN
INTRAVENOUS | Status: AC | PRN
  Administered 2014-04-07 (×2): 999 mL/h via INTRAVENOUS

## 2014-04-07 MED ORDER — DEXTROSE-NACL 5-0.45 % IV SOLN: INTRAVENOUS | Status: DC

## 2014-04-07 MED ORDER — MIDAZOLAM HCL 2 MG/2ML IJ SOLN
2.0000 mg | INTRAMUSCULAR | Status: DC | PRN
  Filled 2014-04-07: qty 2

## 2014-04-07 MED ORDER — CETYLPYRIDINIUM CHLORIDE 0.05 % MT LIQD
7.0000 mL | Freq: Four times a day (QID) | OROMUCOSAL | Status: DC
  Administered 2014-04-08 – 2014-04-09 (×7): 7 mL via OROMUCOSAL

## 2014-04-07 MED ORDER — CHLORHEXIDINE GLUCONATE 0.12 % MT SOLN
15.0000 mL | Freq: Two times a day (BID) | OROMUCOSAL | Status: DC
  Administered 2014-04-07 – 2014-04-09 (×4): 15 mL via OROMUCOSAL
  Filled 2014-04-07 (×4): qty 15

## 2014-04-07 MED ORDER — FENTANYL CITRATE 0.05 MG/ML IJ SOLN
INTRAMUSCULAR | Status: AC
  Administered 2014-04-07: 50 ug via INTRAVENOUS
  Filled 2014-04-07: qty 2

## 2014-04-07 MED ORDER — FENTANYL CITRATE 0.05 MG/ML IJ SOLN
100.0000 ug | INTRAMUSCULAR | Status: DC | PRN
  Filled 2014-04-07: qty 2

## 2014-04-07 MED ORDER — MIDAZOLAM HCL 2 MG/2ML IJ SOLN
2.0000 mg | INTRAMUSCULAR | Status: DC | PRN
  Administered 2014-04-07 (×4): 2 mg via INTRAVENOUS
  Filled 2014-04-07: qty 2

## 2014-04-07 MED ORDER — POTASSIUM CHLORIDE IN NACL 20-0.9 MEQ/L-% IV SOLN
INTRAVENOUS | Status: DC
  Administered 2014-04-07 – 2014-04-08 (×2): 125 mL via INTRAVENOUS
  Filled 2014-04-07 (×4): qty 1000

## 2014-04-07 MED ORDER — SODIUM CHLORIDE 0.9 % IV SOLN
INTRAVENOUS | Status: DC
  Administered 2014-04-07: 10.8 [IU]/h via INTRAVENOUS
  Filled 2014-04-07 (×2): qty 2.5

## 2014-04-07 MED ORDER — SODIUM CHLORIDE 0.9 % IV SOLN
INTRAVENOUS | Status: DC
  Administered 2014-04-07: 5.4 [IU]/h via INTRAVENOUS
  Filled 2014-04-07: qty 2.5

## 2014-04-07 MED ORDER — LACTATED RINGERS IV BOLUS (SEPSIS)
1000.0000 mL | Freq: Once | INTRAVENOUS | Status: AC
  Administered 2014-04-07: 1000 mL via INTRAVENOUS

## 2014-04-07 MED ORDER — EPINEPHRINE HCL 0.1 MG/ML IJ SOSY
PREFILLED_SYRINGE | INTRAMUSCULAR | Status: AC | PRN
  Administered 2014-04-07 (×2): 1 via INTRAVENOUS

## 2014-04-07 MED ORDER — SODIUM CHLORIDE 0.9 % IV SOLN: INTRAVENOUS | Status: DC

## 2014-04-07 MED ORDER — FENTANYL CITRATE 0.05 MG/ML IJ SOLN
100.0000 ug | INTRAMUSCULAR | Status: DC | PRN
  Administered 2014-04-07: 50 ug via INTRAVENOUS
  Filled 2014-04-07 (×2): qty 2

## 2014-04-07 MED ORDER — FENTANYL CITRATE 0.05 MG/ML IJ SOLN
50.0000 ug | INTRAMUSCULAR | Status: DC | PRN
  Administered 2014-04-07 (×4): 50 ug via INTRAVENOUS

## 2014-04-07 MED ORDER — SODIUM CHLORIDE 0.9 % IV BOLUS (SEPSIS)
1000.0000 mL | Freq: Once | INTRAVENOUS | Status: AC
  Administered 2014-04-07: 1000 mL via INTRAVENOUS

## 2014-04-07 NOTE — Progress Notes (Deleted)
[facility] Progress Note Patient Name: SAYLOR SOLIZ DOB: 04-27-1991 MRN: [id number]   Date of Service  04/07/2014  HPI/Events of Note  Called by bedside nurse to review abd film after ng placement.  eICU Interventions  Initial film shot did not show the stomach, asked x-ray to reshoot the film.  2nd x-ray showed tube in place in stomach.         Reginia Forts K. 04/07/2014, 6:03 PM

## 2014-04-07 NOTE — ED Notes (Signed)
Critical Care at bedside.  

## 2014-04-07 NOTE — Progress Notes (Signed)
Attempted to place arterial line once pt arrived to 2S01. I attempted L radial x2. First attempt I was able to get a flash, but unable to thread catheter. Second attempt, I was unable to hit artery, withdrew catheter, and held pressure. A second RT attempted line placement R radial, with same results. RN notified, will also notify MD. RT will continue to monitor.

## 2014-04-07 NOTE — Procedures (Signed)
PROCEDURE NOTE: CVL PLACEMENT  INDICATION:    Monitoring of central venous pressures and/or administration of medications optimally administered in central vein  CONSENT:   Procedure performed emergently  PROCEDURE  Sterile technique was used including antiseptics, cap, gloves, gown, hand hygiene, mask and full body sheet.  Skin prep: Chlorhexidine; local anesthetic administered  A triple lumen catheter was placed in the L [place] vein using the Seldinger technique.   EVALUATION:  Blood flow good  Complications: No apparent complications  Patient tolerated the procedure well.  Chest X-ray revealed proper position without pneumothorax   Merton Border, MD PCCM service

## 2014-04-07 NOTE — Progress Notes (Signed)
Called to ED for CPR in progress. Located  pt's family cousins and roommates in waiting room. Called pt's mom, in [place], [place], and explained that pt was in ED and asked her to come to hospital. She had rec'd a call from family and was on the way. Pt's mom arrived a couple hours later. Upon arrival, physical updated mom and family of pt's condition. Pt's mom and family members were very expressively distraught after hearing of pt's critical status. After gaining composure, pt's family gathered in a circle of prayer and sang a song. Chaplain escorted pt's mother to pt bedside for a brief visit in ED until pt was transported to ICU. Pt's family was very appreciated of Chaplain support, updates, nutrition provided, help and prayers. Ernest Haber Chaplain  04/07/14 1700  Clinical Encounter Type  Visited With Patient

## 2014-04-07 NOTE — ED Notes (Signed)
Per [facility]: Pt from home. Per family, pt last seen yesterday. At 1350 today family "kicked down door because she wouldn't answer". Patient was found unresponsive on bed. On EMS arrival, pt found with weak thready carotid pulses. Pt initial rhythm was irregular rhythm. Pt had agonal respirations. Pt then moved to truck and at 1400 pulses were lost, CPR initiated. Pt noted to be asystole on monitor. 6 epi's given. 2 Narcan found with no response. On arrival to ED, pt on [place]. Pt intubated with 7.0 ETT. CBG >600. Hx: diabetes. Per family, pt hasn't been feeling well for last several days.

## 2014-04-07 NOTE — ED Notes (Signed)
Pt noted to be blinking. Not making any purposeful movement.

## 2014-04-07 NOTE — Code Documentation (Signed)
CPR paused. No pulses. Compressions resumed.

## 2014-04-07 NOTE — ED Notes (Signed)
CRITICAL VALUE ALERT  Critical value received:   K: >7.7 CO2: 10 Glucose: 2,000 Calcium: 16.3  Date of notification:  04/07/2014  Time of notification:  1602  Critical value read back: Yes  Nurse who received alert: Elyn Peers   MD notified : Dr. Rosita Fire

## 2014-04-07 NOTE — Code Documentation (Addendum)
CPR paused. Pt placed on [place]. Intermittent right femoral pulse per MD Wofford. Cardiac activity noted on ultrasound. ROSC.

## 2014-04-07 NOTE — ED Notes (Signed)
Pt noted to be moving all extremities. Md Wofford made aware.

## 2014-04-07 NOTE — ED Notes (Signed)
Mother at bedside updated on patients status.

## 2014-04-07 NOTE — ED Provider Notes (Signed)
CSN: [id number]     Arrival date & time 04/07/14  1418 History   First MD Initiated Contact with Patient 04/07/14 1443     Chief Complaint  Patient presents with  . Cardiac Arrest     (Consider location/radiation/quality/duration/timing/severity/associated sxs/prior Treatment) HPI Comments: 23 yo female with a history of diabetes who presented via EMS after being found unresponsive.  According to EMS, patient was last seen by family last night. They had to kick open her door today, and when they did they found her to be unresponsive. At time of EMS arrival, she was found to be agonal he breathing and pulseless with asystole on the monitor. They administered epinephrine and Narcan and performed CPR for approximately 25 minutes without a change in her status.  No additional information is available at this time.  Level V caveat applies secondary to patient's unresponsiveness   Past Medical History  Diagnosis Date  . Diabetes mellitus type 1     2005 dx [facility]   Past Surgical History  Procedure Laterality Date  . Cataract extraction      6 years ago   Family History  Problem Relation Age of Onset  . Diabetes Maternal Grandmother   . Stroke Maternal Grandmother   . Cancer      aunt, passed last month, ?RCC   History  Substance Use Topics  . Smoking status: Never Smoker   . Smokeless tobacco: Not on file  . Alcohol Use: Yes     Comment: occasional   OB History   Grav Para Term Preterm Abortions TAB SAB Ect Mult Living                 Review of Systems  Unable to perform ROS: Mental status change      Allergies  Review of patient's allergies indicates no known allergies.  Home Medications   Prior to Admission medications   Medication Sig Start Date End Date Taking? Authorizing Provider  gabapentin (NEURONTIN) 100 MG capsule Take 1 capsule (100 mg total) by mouth at bedtime. 03/06/14  Yes Deepak Advani, MD  insulin glargine (LANTUS) 100 UNIT/ML injection  Inject 0.15 mLs (15 Units total) into the skin every morning. 02/15/14  Yes Arman Filter, MD  ibuprofen (ADVIL,MOTRIN) 200 MG tablet Take 400 mg by mouth as needed for headache or cramping.     Historical Provider, MD  insulin aspart (NOVOLOG) 100 UNIT/ML injection Inject 3 Units into the skin 3 (three) times daily with meals. Do not use if your blood sugar is <100 or you eat <50% of your meal. 02/15/14   Arman Filter, MD   BP 80/42  Pulse 99  Resp 22  SpO2 100% Physical Exam  Nursing note and vitals reviewed. Constitutional: She appears well-developed and well-nourished.  HENT:  Head: Normocephalic and atraumatic.  ET Tube in oropharynx  Eyes: No scleral icterus.  Pupils 77mm and unreactive  Neck: Neck supple.  Cardiovascular:  No peripheral or central pulses  Pulmonary/Chest: No stridor.  Course mechanical breath sounds  Abdominal: Soft. She exhibits no distension.  Musculoskeletal: Normal range of motion.  IO in place in left proximal tibia  Neurological: She is unresponsive. GCS eye subscore is 1. GCS verbal subscore is 1. GCS motor subscore is 1.  Skin: Skin is warm and dry. No rash noted.  Psychiatric: She has a normal mood and affect. Her behavior is normal.    ED Course  CRITICAL CARE Performed by: Artis Delay Authorized by: Artis Delay  Total critical care time: 60 minutes Critical care time was exclusive of separately billable procedures and treating other patients. Critical care was necessary to treat or prevent imminent or life-threatening deterioration of the following conditions: metabolic crisis, circulatory failure and CNS failure or compromise. Critical care was time spent personally by me on the following activities: development of treatment plan with patient or surrogate, discussions with consultants, evaluation of patient's response to treatment, examination of patient, obtaining history from patient or surrogate, ordering and performing treatments and  interventions, ordering and review of laboratory studies, ordering and review of radiographic studies, pulse oximetry, re-evaluation of patient's condition and review of old charts.  Angiocath insertion Date/Time: 04/07/2014 4:55 PM Performed by: Artis Delay Authorized by: Artis Delay Consent: The procedure was performed in an emergent situation. Comments: 18g angiocath inserted by me into her left external jugular vein with good return of dark, nonpulsatile blood.  Flushed easily.     Cardiopulmonary Resuscitation (CPR) Procedure Note Directed/Performed by: Serita Grit DAVID III I personally directed ancillary staff and/or performed CPR in an effort to regain return of spontaneous circulation and to maintain cardiac, neuro and systemic perfusion.      (including critical care time) Labs Review Labs Reviewed  I-STAT CG4 LACTIC ACID, ED - Abnormal; Notable for the following:    Lactic Acid, Venous 6.87 (*)    All other components within normal limits  I-STAT CHEM 8, ED - Abnormal; Notable for the following:    Sodium 122 (*)    Potassium 8.0 (*)    Chloride 92 (*)    BUN 74 (*)    Creatinine, Ser 3.40 (*)    Glucose, Bld >700 (*)    Calcium, Ion 2.15 (*)    Hemoglobin 11.6 (*)    HCT 34.0 (*)    All other components within normal limits  I-STAT VENOUS BLOOD GAS, ED - Abnormal; Notable for the following:    pH, Ven 6.896 (*)    pCO2, Ven 53.1 (*)    pO2, Ven 57.0 (*)    Bicarbonate 10.3 (*)    Acid-base deficit 23.0 (*)    All other components within normal limits  I-STAT ARTERIAL BLOOD GAS, ED - Abnormal; Notable for the following:    pH, Arterial 7.006 (*)    pO2, Arterial 567.0 (*)    Bicarbonate 9.2 (*)    Acid-base deficit 21.0 (*)    All other components within normal limits  CBC WITH DIFFERENTIAL  COMPREHENSIVE METABOLIC PANEL  URINALYSIS, ROUTINE W REFLEX MICROSCOPIC  URINE RAPID DRUG SCREEN (HOSP PERFORMED)  PREGNANCY, URINE    Imaging Review  Dg  Chest Portable 1 View  04/07/2014   CLINICAL DATA:  Status post seed P are.  Status post intubation.  EXAM: PORTABLE CHEST - 1 VIEW  COMPARISON:  02/12/2014  FINDINGS: The ET tube tip is in satisfactory position above the carina. The nasogastric tube is in the stomach. Heart size is normal. No pleural effusion or edema.  IMPRESSION: Satisfactory position of the ET tube and nasogastric tube.   Electronically Signed   By: Kerby Moors M.D.   On: 04/07/2014 15:28   All radiology studies independently viewed by me.      EKG Interpretation   Date/Time:  Sunday April 07 2014 14:34:09 EDT Ventricular Rate:  121 PR Interval:    QRS Duration: 93 QT Interval:  409 QTC Calculation: 580 R Axis:   76 Text Interpretation:  Atrial fibrillation Repol abnrm suggests ischemia,  diffuse  leads Prolonged QT interval Baseline wander in lead(s) V2 compared  to prior, now in a fib with diffuse st/t changes Confirmed by [facility]  MD,  TREY ([id number]) on 04/07/2014 4:50:03 PM      MDM   Final diagnoses:  Cardiac arrest  Diabetic ketoacidosis with coma associated with type 1 diabetes mellitus    23 yo female presenting in cardiac arrest.  She had approximately 20 minutes of asystole and CPR prior to arrival.  She was given narcan and epi prior to arrival. She was intubated prior to arrival.  Blood sugar was undetectably high and she has had multiple admissions for DKA per her chart.  She was given bicarb, insulin, calcium, fluids, and epi during her code once she got here.  Ultimately, she regained pulses.  She was very acidotic.  She was started on bicarb gtt and insulin gtt.  She was fluid resuscitated.  Ultimately, she began to move her extremities.  Critical care consulted, evaluated in the ED, and will admit.      Artis Delay, MD 04/07/14 [phone]

## 2014-04-07 NOTE — Progress Notes (Signed)
Dr. Doy Mince MD notified of panic ABG values at 1450

## 2014-04-07 NOTE — Progress Notes (Signed)
[facility] Progress Note Patient Name: [name] DOB: October 16, 1990 MRN: [id number]   Date of Service  04/07/2014  HPI/Events of Note  Potassium 4.4  eICU Interventions  Stopped bicarb fluids.  Changed to NS +79mEqKCl     Intervention Category Major Interventions: Electrolyte abnormality - evaluation and management  Rodert Hinch K. 04/07/2014, 6:40 PM

## 2014-04-07 NOTE — H&P (Signed)
PULMONARY / CRITICAL CARE MEDICINE   Name: [name] MRN: [id number] DOB: Mar 01, 1991    ADMISSION DATE:  04/07/2014   REFERRING MD :  EDP  CHIEF COMPLAINT:  Post arrest   INITIAL PRESENTATION:  73F admitted with very severe DKA and s/p prolonged cardiac arrest (reportedly > 20 mins CPR) due to hyperkalemia  STUDIES:    SIGNIFICANT EVENTS:   INDWELLING DEVICES:: ETT 10/18 >>  L [place] CVL 10/18 >>  Radial A line (ordered) 10/18 >>   MICRO DATA:   ANTIMICROBIALS:    HISTORY OF PRESENT ILLNESS:   23yo female with type 1 DM and prior episodes of DKA reportedly feeling poorly for 5-6 days PTA. She was last seen by family and friends on the evening prior to admission. Her roommates were unable to get in touch with her in the morning and ultimately had to force their way into her room where they found her on the floor unresponsive with agonal respirations. A friend initiated chest compressions and EMS was dispatched. Upon their arrival, she was pulseless with initial rhythm of asystole. She was intubated and reportedly required > 20 mins CPR prior to restoration of spontaneous circulation. In the ED, she was found to be severely acidotic, hyperglycemic and hyperkalemic. A bicarbonate infusion and an insulin infusion were initiated by the ER MD. PCCM was asked to admit.  PAST MEDICAL HISTORY :   has a past medical history of Diabetes mellitus type 1.  has past surgical history that includes Cataract extraction. Prior to Admission medications   Medication Sig Start Date End Date Taking? Authorizing Provider  gabapentin (NEURONTIN) 100 MG capsule Take 1 capsule (100 mg total) by mouth at bedtime. 03/06/14   Lorayne Marek, MD  ibuprofen (ADVIL,MOTRIN) 200 MG tablet Take 400 mg by mouth as needed for headache or cramping.     Historical Provider, MD  insulin aspart (NOVOLOG) 100 UNIT/ML injection Inject 3 Units into the skin 3 (three) times daily with meals. Do not use if your  blood sugar is <100 or you eat <50% of your meal. 02/15/14   Arman Filter, MD  insulin glargine (LANTUS) 100 UNIT/ML injection Inject 0.15 mLs (15 Units total) into the skin every morning. 02/15/14   Arman Filter, MD   No Known Allergies  FAMILY HISTORY:  has no family status information on file.  SOCIAL HISTORY:  reports that she has never smoked. She does not have any smokeless tobacco history on file. She reports that she drinks alcohol. She reports that she does not use illicit drugs.  REVIEW OF SYSTEMS:  Unable   SUBJECTIVE:   VITAL SIGNS: Pulse Rate:  [77-117] 77 (10/18 1510) Resp:  [22-28] 22 (10/18 1505) BP: (59-117)/(38-87) 91/38 mmHg (10/18 1510) SpO2:  [95 %-100 %] 100 % (10/18 1505) FiO2 (%):  [100 %] 100 % (10/18 1445) HEMODYNAMICS:   VENTILATOR SETTINGS: Vent Mode:  [-] PRVC FiO2 (%):  [100 %] 100 % Set Rate:  [22 bmp] 22 bmp Vt Set:  [500 mL] 500 mL PEEP:  [5 cmH20] 5 cmH20 Plateau Pressure:  [23 cmH20] 23 cmH20 INTAKE / OUTPUT:  Intake/Output Summary (Last 24 hours) at 04/07/14 1512 Last data filed at 04/07/14 1505  Gross per 24 hour  Intake    600 ml  Output      0 ml  Net    600 ml    PHYSICAL EXAMINATION: General: WDWN, intubated, RASS -4 Neuro: nonpurposeful movements, MAEs, PERRL HEENT: NCAT Cardiovascular: Reg with extrasystoles,  hyperdynamic, no M noted Lungs: clear to auscultation Abdomen: Soft, NT, diminished BS Ext: no edema, symmetric pulses Skin: no lesions noted  LABS:  CBC  Recent Labs Lab 04/07/14 1433  HGB 11.6*  HCT 34.0*   Coag's No results found for this basename: APTT, INR,  in the last 168 hours BMET  Recent Labs Lab 04/07/14 1433  NA 122*  K 8.0*  CL 92*  BUN 74*  CREATININE 3.40*  GLUCOSE >700*   Electrolytes No results found for this basename: CALCIUM, MG, PHOS,  in the last 168 hours Sepsis Markers  Recent Labs Lab 04/07/14 1433  LATICACIDVEN 6.87*   ABG  Recent Labs Lab 04/07/14 1442   PHART 7.006*  PCO2ART 36.6  PO2ART 567.0*   Liver Enzymes No results found for this basename: AST, ALT, ALKPHOS, BILITOT, ALBUMIN,  in the last 168 hours Cardiac Enzymes No results found for this basename: TROPONINI, PROBNP,  in the last 168 hours Glucose No results found for this basename: GLUCAP,  in the last 168 hours  Imaging No results found.   ASSESSMENT / PLAN: ENDOCRINE A: DM 1 Very severe DKA suspect due to poor DM management P:   DKA protocol  CARDIOVASCULAR Prolonged asystolic arrest - likely due to hyperkalemia Transient AF after ROSC > sinus rhythm with extrasystoles P:  Monitor Echocardiogram ordered  RENAL Severe AG acidosis due to DKA, lactic acidosis AKI, nonoliguric Severe hyperkalemia Hypercalcemia P:   Monitor BMET per DKA protocol Monitor I/Os Correct electrolytes as indicated HCO3 gtt ordered due to hyperK+  PULMONARY Acute respiratory failure post cardiac arrest High Ve demands due to severe met acidosis P:   Cont full vent support - settings reviewed and/or adjusted Cont vent bundle Daily SBT if/when meets criteria Frequent ABGs  GASTROINTESTINAL A: ? Bloody gastric aspirate  P:   SUP: IV PPI   HEMATOLOGIC A: Very mild anemia P:  DVT px: SCDs Monitor CBC intermittently Transfuse per usual ICU guidelines  INFECTIOUS A: No overt infection P:   Monitor off abx  NEUROLOGIC A: Anoxic encephalopathy P:   RASS goal: -1, -2 Sedation protocol while on vent  Daily WUA  Further eval to be dictated by clinical course  Family updated: mother and several cousins/friends updated in detail 10/18    TODAY'S SUMMARY:   I have personally obtained a history, examined the patient, evaluated laboratory and imaging results, formulated the assessment and plan and placed orders. CRITICAL CARE: The patient is critically ill with multiple organ systems failure and requires high complexity decision making for assessment and support,  frequent evaluation and titration of therapies, application of advanced monitoring technologies and extensive interpretation of multiple databases. Critical Care Time devoted to patient care services described in this note is 60 minutes.    Merton Border, MD ; [facility] [phone].  After 5:30 PM or weekends, call [phone]

## 2014-04-07 NOTE — ED Notes (Signed)
POC lab results reported to Dr.Wofford. ED-Lab.

## 2014-04-08 ENCOUNTER — Inpatient Hospital Stay (HOSPITAL_COMMUNITY): Payer: Medicaid Other

## 2014-04-08 DIAGNOSIS — I469 Cardiac arrest, cause unspecified: Secondary | ICD-10-CM

## 2014-04-08 LAB — POCT I-STAT 3, VENOUS BLOOD GAS (G3P V)
ACID-BASE DEFICIT: 6 mmol/L — AB (ref 0.0–2.0)
Acid-base deficit: 8 mmol/L — ABNORMAL HIGH (ref 0.0–2.0)
BICARBONATE: 18 meq/L — AB (ref 20.0–24.0)
Bicarbonate: 16.2 mEq/L — ABNORMAL LOW (ref 20.0–24.0)
O2 SAT: 49 %
O2 Saturation: 45 %
PH VEN: 7.39 — AB (ref 7.250–7.300)
Patient temperature: 98.9
TCO2: 17 mmol/L (ref 0–100)
TCO2: 19 mmol/L (ref 0–100)
pCO2, Ven: 26.9 mmHg — ABNORMAL LOW (ref 45.0–50.0)
pCO2, Ven: 27.3 mmHg — ABNORMAL LOW (ref 45.0–50.0)
pH, Ven: 7.427 — ABNORMAL HIGH (ref 7.250–7.300)
pO2, Ven: 27 mmHg — CL (ref 30.0–45.0)
pO2, Ven: 24 mmHg — CL (ref 30.0–45.0)

## 2014-04-08 LAB — GLUCOSE, CAPILLARY
GLUCOSE-CAPILLARY: 397 mg/dL — AB (ref 70–99)
GLUCOSE-CAPILLARY: 225 mg/dL — AB (ref 70–99)
GLUCOSE-CAPILLARY: 180 mg/dL — AB (ref 70–99)
Glucose-Capillary: >600 mg/dL (ref 70–99)
Glucose-Capillary: >600 mg/dL (ref 70–99)
Glucose-Capillary: >600 mg/dL (ref 70–99)
Glucose-Capillary: >600 mg/dL (ref 70–99)
Glucose-Capillary: >600 mg/dL (ref 70–99)
Glucose-Capillary: 290 mg/dL — ABNORMAL HIGH (ref 70–99)
Glucose-Capillary: 298 mg/dL — ABNORMAL HIGH (ref 70–99)
Glucose-Capillary: 325 mg/dL — ABNORMAL HIGH (ref 70–99)
Glucose-Capillary: 421 mg/dL — ABNORMAL HIGH (ref 70–99)
Glucose-Capillary: 494 mg/dL — ABNORMAL HIGH (ref 70–99)

## 2014-04-08 LAB — BASIC METABOLIC PANEL
ANION GAP: 19 — AB (ref 5–15)
Anion gap: 20 — ABNORMAL HIGH (ref 5–15)
Anion gap: 11 (ref 5–15)
Anion gap: 12 (ref 5–15)
Anion gap: 13 (ref 5–15)
Anion gap: 11 (ref 5–15)
BUN: 50 mg/dL — ABNORMAL HIGH (ref 6–23)
BUN: 48 mg/dL — AB (ref 6–23)
BUN: 46 mg/dL — ABNORMAL HIGH (ref 6–23)
BUN: 38 mg/dL — ABNORMAL HIGH (ref 6–23)
BUN: 35 mg/dL — ABNORMAL HIGH (ref 6–23)
BUN: 32 mg/dL — AB (ref 6–23)
CALCIUM: 7.6 mg/dL — AB (ref 8.4–10.5)
CALCIUM: 7.3 mg/dL — AB (ref 8.4–10.5)
CALCIUM: 7.4 mg/dL — AB (ref 8.4–10.5)
CO2: 17 mEq/L — ABNORMAL LOW (ref 19–32)
CO2: 19 mEq/L (ref 19–32)
CO2: 24 mEq/L (ref 19–32)
CO2: 20 mEq/L (ref 19–32)
CO2: 19 mEq/L (ref 19–32)
CO2: 20 mEq/L (ref 19–32)
CREATININE: 2.27 mg/dL — AB (ref 0.50–1.10)
Calcium: 7.4 mg/dL — ABNORMAL LOW (ref 8.4–10.5)
Calcium: 7.7 mg/dL — ABNORMAL LOW (ref 8.4–10.5)
Calcium: 7.6 mg/dL — ABNORMAL LOW (ref 8.4–10.5)
Chloride: 110 mEq/L (ref 96–112)
Chloride: 112 mEq/L (ref 96–112)
Chloride: 120 mEq/L — ABNORMAL HIGH (ref 96–112)
Chloride: 118 mEq/L — ABNORMAL HIGH (ref 96–112)
Chloride: 115 mEq/L — ABNORMAL HIGH (ref 96–112)
Chloride: 115 mEq/L — ABNORMAL HIGH (ref 96–112)
Creatinine, Ser: 2.33 mg/dL — ABNORMAL HIGH (ref 0.50–1.10)
Creatinine, Ser: 2.27 mg/dL — ABNORMAL HIGH (ref 0.50–1.10)
Creatinine, Ser: 1.94 mg/dL — ABNORMAL HIGH (ref 0.50–1.10)
Creatinine, Ser: 1.89 mg/dL — ABNORMAL HIGH (ref 0.50–1.10)
Creatinine, Ser: 1.76 mg/dL — ABNORMAL HIGH (ref 0.50–1.10)
GFR calc Af Amer: 34 mL/min — ABNORMAL LOW (ref >90)
GFR calc Af Amer: 41 mL/min — ABNORMAL LOW (ref >90)
GFR calc Af Amer: 42 mL/min — ABNORMAL LOW (ref >90)
GFR calc Af Amer: 46 mL/min — ABNORMAL LOW (ref >90)
GFR, EST AFRICAN AMERICAN: 33 mL/min — AB (ref >90)
GFR, EST AFRICAN AMERICAN: 34 mL/min — AB (ref >90)
GFR, EST NON AFRICAN AMERICAN: 28 mL/min — AB (ref >90)
GFR, EST NON AFRICAN AMERICAN: 29 mL/min — AB (ref >90)
GFR, EST NON AFRICAN AMERICAN: 29 mL/min — AB (ref >90)
GFR, EST NON AFRICAN AMERICAN: 35 mL/min — AB (ref >90)
GFR, EST NON AFRICAN AMERICAN: 36 mL/min — AB (ref >90)
GFR, EST NON AFRICAN AMERICAN: 40 mL/min — AB (ref >90)
GLUCOSE: 129 mg/dL — AB (ref 70–99)
GLUCOSE: 127 mg/dL — AB (ref 70–99)
GLUCOSE: 201 mg/dL — AB (ref 70–99)
GLUCOSE: 146 mg/dL — AB (ref 70–99)
Glucose, Bld: 586 mg/dL (ref 70–99)
Glucose, Bld: 427 mg/dL — ABNORMAL HIGH (ref 70–99)
POTASSIUM: 3.5 meq/L — AB (ref 3.7–5.3)
POTASSIUM: 3.5 meq/L — AB (ref 3.7–5.3)
Potassium: 3.2 mEq/L — ABNORMAL LOW (ref 3.7–5.3)
Potassium: 4.2 mEq/L (ref 3.7–5.3)
Potassium: 3.9 mEq/L (ref 3.7–5.3)
Potassium: 3.3 mEq/L — ABNORMAL LOW (ref 3.7–5.3)
SODIUM: 150 meq/L — AB (ref 137–147)
SODIUM: 147 meq/L (ref 137–147)
SODIUM: 146 meq/L (ref 137–147)
Sodium: 147 mEq/L (ref 137–147)
Sodium: 150 mEq/L — ABNORMAL HIGH (ref 137–147)
Sodium: 155 mEq/L — ABNORMAL HIGH (ref 137–147)

## 2014-04-08 LAB — TROPONIN I: Troponin I: 0.47 ng/mL (ref <0.30)

## 2014-04-08 LAB — CBC
HCT: 22.8 % — ABNORMAL LOW (ref 36.0–46.0)
Hemoglobin: 8 g/dL — ABNORMAL LOW (ref 12.0–15.0)
MCH: 25.6 pg — AB (ref 26.0–34.0)
MCHC: 35.1 g/dL (ref 30.0–36.0)
MCV: 72.8 fL — ABNORMAL LOW (ref 78.0–100.0)
Platelets: 178 10*3/uL (ref 150–400)
RBC: 3.13 MIL/uL — ABNORMAL LOW (ref 3.87–5.11)
RDW: 12.9 % (ref 11.5–15.5)
WBC: 7 10*3/uL (ref 4.0–10.5)

## 2014-04-08 MED ORDER — SODIUM CHLORIDE 0.45 % IV BOLUS
1000.0000 mL | INTRAVENOUS | Status: AC
  Administered 2014-04-08 (×2): 1000 mL via INTRAVENOUS

## 2014-04-08 MED ORDER — FENTANYL CITRATE 0.05 MG/ML IJ SOLN
25.0000 ug | INTRAMUSCULAR | Status: DC | PRN
  Administered 2014-04-08: 100 ug via INTRAVENOUS

## 2014-04-08 MED ORDER — MIDAZOLAM HCL 2 MG/2ML IJ SOLN
1.0000 mg | INTRAMUSCULAR | Status: DC | PRN
  Administered 2014-04-08 (×2): 1 mg via INTRAVENOUS
  Administered 2014-04-08: 2 mg via INTRAVENOUS
  Filled 2014-04-08 (×2): qty 2

## 2014-04-08 MED ORDER — POTASSIUM CHLORIDE 10 MEQ/50ML IV SOLN
10.0000 meq | INTRAVENOUS | Status: AC
  Administered 2014-04-08 (×3): 10 meq via INTRAVENOUS
  Filled 2014-04-08 (×3): qty 50

## 2014-04-08 MED ORDER — DEXTROSE-NACL 5-0.45 % IV SOLN
INTRAVENOUS | Status: DC
  Administered 2014-04-08: 10:00:00 via INTRAVENOUS

## 2014-04-08 MED ORDER — FENTANYL CITRATE 0.05 MG/ML IJ SOLN
12.5000 ug | INTRAMUSCULAR | Status: DC | PRN
  Administered 2014-04-09 (×3): 25 ug via INTRAVENOUS
  Filled 2014-04-08 (×2): qty 2

## 2014-04-08 MED ORDER — PHENYLEPHRINE HCL 10 MG/ML IJ SOLN
30.0000 ug/min | INTRAVENOUS | Status: DC
  Administered 2014-04-08: 45 ug/min via INTRAVENOUS
  Filled 2014-04-08: qty 4

## 2014-04-08 MED ORDER — DEXTROSE 50 % IV SOLN
12.0000 mL | Freq: Once | INTRAVENOUS | Status: AC | PRN
  Administered 2014-04-08: 12 mL via INTRAVENOUS

## 2014-04-08 MED FILL — Medication: Qty: 1 | Status: AC

## 2014-04-08 NOTE — Progress Notes (Signed)
eLink Physician-Brief Progress Note Patient Name: [name] DOB: 1990-07-23 MRN: [id number]   Date of Service  04/08/2014  HPI/Events of Note    eICU Interventions  Periods of hypoglycemia, will increase d5 0.45ns to 125cc/h. Expect that she will come off insulin gtt at her next blood draw     Intervention Category Minor Interventions: Routine modifications to care plan (e.g. PRN medications for pain, fever)  Jillene Wehrenberg S. 04/08/2014, 4:21 PM

## 2014-04-08 NOTE — Progress Notes (Signed)
  Hypoglycemic Event  CBG: 69  Treatment: D50 71ml  Symptoms: None  Follow-up CBG: Time:1230 CBG Result:137  Possible Reasons for Event: Other: insulin drip     Lenox Ahr  Remember to initiate Hypoglycemia Order Set & complete

## 2014-04-08 NOTE — Progress Notes (Signed)
Echocardiogram 2D Echocardiogram has been performed.  Takashi Korol 04/08/2014, 1:56 PM

## 2014-04-08 NOTE — Progress Notes (Signed)
PULMONARY / CRITICAL CARE MEDICINE   Name: [name] MRN: [id number] DOB: [birth year]    ADMISSION DATE:  04/07/2014   REFERRING MD :  EDP  CHIEF COMPLAINT:  Post arrest   INITIAL PRESENTATION:  80F admitted with very severe DKA and s/p prolonged cardiac arrest (reportedly > 20 mins CPR) due to hyperkalemia  STUDIES:   SIGNIFICANT EVENTS:  INDWELLING DEVICES:: ETT 10/18 >>  L [place] CVL 10/18 >>   MICRO DATA:   ANTIMICROBIALS:    SUBJECTIVE:  Following commands on WUA tolerating PSV Off Na Bicarb Remains on phenylephrine    VITAL SIGNS: Temp:  [91.7 F (33.2 C)-99.7 F (37.6 C)] 98.4 F (36.9 C) (10/19 0845) Pulse Rate:  [75-125] 114 (10/19 0845) Resp:  [14-33] 16 (10/19 0845) BP: (59-125)/(33-87) 125/69 mmHg (10/19 0845) SpO2:  [95 %-100 %] 100 % (10/19 0845) FiO2 (%):  [30 %-100 %] 30 % (10/19 0845) Weight:  [52.4 kg (115 lb 8.3 oz)] 52.4 kg (115 lb 8.3 oz) (10/19 0600) HEMODYNAMICS:   VENTILATOR SETTINGS: Vent Mode:  [-] CPAP;PSV FiO2 (%):  [30 %-100 %] 30 % Set Rate:  [20 bmp-22 bmp] 20 bmp Vt Set:  [500 mL] 500 mL PEEP:  [5 cmH20] 5 cmH20 Pressure Support:  [5 cmH20] 5 cmH20 Plateau Pressure:  [13 cmH20-23 cmH20] 13 cmH20 INTAKE / OUTPUT:  Intake/Output Summary (Last 24 hours) at 04/08/14 0907 Last data filed at 04/08/14 0800  Gross per 24 hour  Intake 5022.37 ml  Output   3810 ml  Net 1212.37 ml    PHYSICAL EXAMINATION: General: WDWN, intubated, RASS 0 Neuro:  Awake and following commands, MAEs, PERRL HEENT: NCAT Cardiovascular: Regular, hyperdynamic, no M Lungs: clear to auscultation Abdomen: tender to palp in all quad's, no rebound  Ext: no edema, symmetric pulses; L LE tender to palp pre-tibial region.  Skin: no lesions noted  LABS:  CBC  Recent Labs Lab 04/07/14 1436 04/07/14 1554 04/07/14 1941 04/08/14 0400  WBC 11.7* 11.8*  --  7.0  HGB 10.2* 9.2* 10.5* 8.0*  HCT 38.7 25.5* 31.0* 22.8*  PLT 235 210  --  178    Coag's No results found for this basename: APTT, INR,  in the last 168 hours BMET  Recent Labs Lab 04/07/14 2314 04/08/14 0200 04/08/14 0400  NA 140 147 150*  K 3.7 3.5* 3.5*  CL 103 110 112  CO2 14* 17* 19  BUN 53* 50* 48*  CREATININE 2.41* 2.33* 2.27*  GLUCOSE 797* 586* 427*   Electrolytes  Recent Labs Lab 04/07/14 2314 04/08/14 0200 04/08/14 0400  CALCIUM 7.4* 7.4* 7.7*   Sepsis Markers  Recent Labs Lab 04/07/14 1433 04/07/14 2314  LATICACIDVEN 6.87* 4.1*   ABG  Recent Labs Lab 04/07/14 1442  PHART 7.006*  PCO2ART 36.6  PO2ART 567.0*   Liver Enzymes  Recent Labs Lab 04/07/14 1436  AST 337*  ALT 198*  ALKPHOS 113  BILITOT <0.2*  ALBUMIN 2.3*   Cardiac Enzymes  Recent Labs Lab 04/08/14 0400  TROPONINI 0.47*   Glucose  Recent Labs Lab 04/08/14 0314 04/08/14 0423 04/08/14 0524 04/08/14 0617 04/08/14 0701 04/08/14 0805  GLUCAP 421* 397* 325* 290* 298* 225*    Imaging Dg Chest Portable 1 View  04/07/2014   CLINICAL DATA:  Status post cardiac arrest.  Central line placement.  EXAM: PORTABLE CHEST - 1 VIEW  COMPARISON:  Earlier today.  FINDINGS: Interval left subclavian catheter with its tip in the upper right atrium. No pneumothorax. Nasogastric  tube tip in the mid to distal stomach. Normal sized heart. Clear lungs. Endotracheal tube in satisfactory position. No acute bony abnormality.  IMPRESSION: 1. Left subclavian catheter tip in the upper right atrium. It is recommended that this be retracted 2 cm for placement in the superior vena cava. 2. No pneumothorax.   Electronically Signed   By: Enrique Sack M.D.   On: 04/07/2014 16:00   Dg Chest Portable 1 View  04/07/2014   CLINICAL DATA:  Status post seed P are.  Status post intubation.  EXAM: PORTABLE CHEST - 1 VIEW  COMPARISON:  02/12/2014  FINDINGS: The ET tube tip is in satisfactory position above the carina. The nasogastric tube is in the stomach. Heart size is normal. No pleural  effusion or edema.  IMPRESSION: Satisfactory position of the ET tube and nasogastric tube.   Electronically Signed   By: Kerby Moors M.D.   On: 04/07/2014 15:28     ASSESSMENT / PLAN: ENDOCRINE A: DM 1 Very severe DKA suspect due to poor DM management P:   DKA protocol initiated Continue aggressive fluids, will change to 0.45NS boluses + D5 0.45NS '@100'   CARDIOVASCULAR Prolonged asystolic arrest - likely due to hyperkalemia Transient AF after ROSC > sinus rhythm with extrasystoles Shock, suspect hypovolemic but consider cardiogenic given arrest and possible ischemic injury P:  Volume resuscitation as above Set up CVP's to guide volume Echocardiogram ordered and pending  RENAL Severe AG acidosis due to DKA, lactic acidosis AKI, nonoliguric Severe hyperkalemia Hypercalcemia P:   Monitor BMET per DKA protocol, currently q4h Monitor I/Os Correct electrolytes as indicated; will take KCL out of IVF 10/19 am since pH corrected and K 3.5 HCO3 gtt stopped 10/18 pm  PULMONARY Acute respiratory failure post cardiac arrest High Ve demands due to severe met acidosis P:   Cont full vent support - settings reviewed and/or adjusted Cont vent bundle Daily SBT, suspect she will extubate 10/19  GASTROINTESTINAL A: ? Bloody gastric aspirate  P:   SUP: IV PPI   HEMATOLOGIC A: Very mild anemia P:  DVT px: SCDs Monitor CBC intermittently Transfuse per usual ICU guidelines  INFECTIOUS A: No overt infection P:   Monitor off abx  NEUROLOGIC A: Anoxic encephalopathy P:   RASS goal: 0 Sedation protocol while on vent    Family updated: mother and several cousins/friends updated in detail by Dr Alva Garnet 10/18   TODAY'S SUMMARY: Severe DKA with improving acidosis and MS. Will extubate 10/19, continue to get frequent labs and correct metabolic disarray. Continue insulin until we meet AG and CO2 goals.   I have personally obtained a history, examined the patient, evaluated  laboratory and imaging results, formulated the assessment and plan and placed orders.  CRITICAL CARE: The patient is critically ill with multiple organ systems failure and requires high complexity decision making for assessment and support, frequent evaluation and titration of therapies, application of advanced monitoring technologies and extensive interpretation of multiple databases. Critical Care Time devoted to patient care services by me described in this note is 45 minutes.   Baltazar Apo, MD, PhD 04/08/2014, 9:23 AM [facility] [phone] or if no answer [phone]

## 2014-04-08 NOTE — Progress Notes (Signed)
Nutrition Brief Note  Patient identified on the Malnutrition Screening Tool (MST) Report for recent weight lost without trying (patient is unsure).  Per wt readings below, patient's weight has trended up.  Wt Readings from Last 15 Encounters:  04/08/14 115 lb 8.3 oz (52.4 kg)  03/06/14 109 lb 9.6 oz (49.714 kg)  02/12/14 110 lb 10.7 oz (50.2 kg)  12/09/13 121 lb (54.885 kg)    Body mass index is 21.84 kg/(m^2). Patient meets criteria for Normal based on current BMI.   Current diet order is NPO.  Labs and medications reviewed.   No nutrition interventions warranted at this time. If nutrition issues arise, please consult RD.   Arthur Holms, RD, LDN Pager #: [phone] After-Hours Pager #: [phone]

## 2014-04-08 NOTE — Clinical Documentation Improvement (Signed)
Possible Clinical Conditions?     >Hypernatremia                                >Other Condition               >Cannot Clinically Determine   Lab: 10/19:  Sodium: 155.   Thank You, Theron Arista, Clinical Documentation Specialist:  [phone]  [facility] Information [facility]

## 2014-04-08 NOTE — Procedures (Signed)
Extubation Procedure Note  Patient Details:   Name: [name] DOB: Jul 07, 1990 MRN: [id number]   Airway Documentation:     Evaluation  O2 sats: stable throughout Complications: No apparent complications Patient did tolerate procedure well. Bilateral Breath Sounds: Clear;Diminished Suctioning: Airway Yes Patient extubated to [name]. No complications. Vital signs stable at this time. RT will continue to monitor.  Mcneil Sober 04/08/2014, 9:54 AM

## 2014-04-08 NOTE — Progress Notes (Signed)
Inpatient Diabetes Program Recommendations  AACE/ADA: New Consensus Statement on Inpatient Glycemic Control (2013)  Target Ranges:  Prepandial:   less than 140 mg/dL      Peak postprandial:   less than 180 mg/dL (1-2 hours)      Critically ill patients:  140 - 180 mg/dL   Consult received for poor diabetes control. Pt in [place]. Have reviewed home meds. Typically according to her weight, she should need at least 20 units lantus unless she has some renal issues. Noted pt has renal insufficiency presently, but this most probably due to the acidosis and dehydration. Pt was seen on last admission by one of our diabetes coordinators. At that time, pt was having financial problems with buying her insulin, however our coordinator had her set-up [facility] on Sept 16th. Pt was given vouchers for getting her insulin as well. Coordinator spent countless hours with this patient and her mother.  Will see patient this admission and attempt assessment of potential underlying issues.  Will follow as well. Thank you, Rosita Kea, RN, CNS, Diabetes Coordinator [phone])

## 2014-04-08 NOTE — Progress Notes (Signed)
CRITICAL VALUE ALERT  Critical value received:  Troponin .47 Date of notification:  04-08-14  Time of notification:  0510  Critical value read back: yes  Nurse who received alert:  Patricia Nettle MD notified (1st page):  MD Nelda Marseille  Time of first page:  [phone]  MD notified (2nd page):  Time of second page:  Responding MD:  MD Nelda Marseille  Time MD responded:  [phone]

## 2014-04-08 NOTE — Progress Notes (Signed)
Called to place pt on sitter list. Pt confused and attempting to get out of bed. Currently mother at bedside attempting to keep pt in bed. Bed alarm is on. Will cont to monitor and assess patient

## 2014-04-08 NOTE — Progress Notes (Signed)
Hypoglycemic Event  CBG: 67   Treatment: D50 IV 26ml  Symptoms: None  Follow-up CBG: Time:1505 CBG Result:106  Possible Reasons for Event: Medication regimen: insulin drip      Lenox Ahr  Remember to initiate Hypoglycemia Order Set & complete

## 2014-04-09 ENCOUNTER — Inpatient Hospital Stay (HOSPITAL_COMMUNITY): Payer: Medicaid Other

## 2014-04-09 LAB — BASIC METABOLIC PANEL
ANION GAP: 10 (ref 5–15)
ANION GAP: 10 (ref 5–15)
ANION GAP: 15 (ref 5–15)
Anion gap: 11 (ref 5–15)
BUN: 29 mg/dL — ABNORMAL HIGH (ref 6–23)
BUN: 25 mg/dL — ABNORMAL HIGH (ref 6–23)
BUN: 22 mg/dL (ref 6–23)
BUN: 21 mg/dL (ref 6–23)
CHLORIDE: 120 meq/L — AB (ref 96–112)
CO2: 20 mEq/L (ref 19–32)
CO2: 20 mEq/L (ref 19–32)
CO2: 20 meq/L (ref 19–32)
CO2: 15 meq/L — AB (ref 19–32)
CREATININE: 1.71 mg/dL — AB (ref 0.50–1.10)
CREATININE: 1.61 mg/dL — AB (ref 0.50–1.10)
CREATININE: 1.49 mg/dL — AB (ref 0.50–1.10)
Calcium: 7.6 mg/dL — ABNORMAL LOW (ref 8.4–10.5)
Calcium: 7.7 mg/dL — ABNORMAL LOW (ref 8.4–10.5)
Calcium: 7.9 mg/dL — ABNORMAL LOW (ref 8.4–10.5)
Calcium: 7.9 mg/dL — ABNORMAL LOW (ref 8.4–10.5)
Chloride: 117 mEq/L — ABNORMAL HIGH (ref 96–112)
Chloride: 120 mEq/L — ABNORMAL HIGH (ref 96–112)
Chloride: 113 mEq/L — ABNORMAL HIGH (ref 96–112)
Creatinine, Ser: 1.65 mg/dL — ABNORMAL HIGH (ref 0.50–1.10)
GFR calc Af Amer: 48 mL/min — ABNORMAL LOW (ref >90)
GFR calc Af Amer: 51 mL/min — ABNORMAL LOW (ref >90)
GFR calc non Af Amer: 41 mL/min — ABNORMAL LOW (ref >90)
GFR calc non Af Amer: 44 mL/min — ABNORMAL LOW (ref >90)
GFR calc non Af Amer: 49 mL/min — ABNORMAL LOW (ref >90)
GFR, EST AFRICAN AMERICAN: 50 mL/min — AB (ref >90)
GFR, EST AFRICAN AMERICAN: 56 mL/min — AB (ref >90)
GFR, EST NON AFRICAN AMERICAN: 43 mL/min — AB (ref >90)
GLUCOSE: 117 mg/dL — AB (ref 70–99)
Glucose, Bld: 118 mg/dL — ABNORMAL HIGH (ref 70–99)
Glucose, Bld: 115 mg/dL — ABNORMAL HIGH (ref 70–99)
Glucose, Bld: 262 mg/dL — ABNORMAL HIGH (ref 70–99)
POTASSIUM: 3.6 meq/L — AB (ref 3.7–5.3)
Potassium: 3.6 mEq/L — ABNORMAL LOW (ref 3.7–5.3)
Potassium: 3.5 mEq/L — ABNORMAL LOW (ref 3.7–5.3)
Potassium: 4.2 mEq/L (ref 3.7–5.3)
SODIUM: 148 meq/L — AB (ref 137–147)
Sodium: 150 mEq/L — ABNORMAL HIGH (ref 137–147)
Sodium: 150 mEq/L — ABNORMAL HIGH (ref 137–147)
Sodium: 143 mEq/L (ref 137–147)

## 2014-04-09 LAB — CBC
HCT: 20.1 % — ABNORMAL LOW (ref 36.0–46.0)
HEMOGLOBIN: 7.2 g/dL — AB (ref 12.0–15.0)
MCH: 26.8 pg (ref 26.0–34.0)
MCHC: 35.8 g/dL (ref 30.0–36.0)
MCV: 74.7 fL — ABNORMAL LOW (ref 78.0–100.0)
PLATELETS: 136 10*3/uL — AB (ref 150–400)
RBC: 2.69 MIL/uL — ABNORMAL LOW (ref 3.87–5.11)
RDW: 13.3 % (ref 11.5–15.5)
WBC: 8.8 10*3/uL (ref 4.0–10.5)

## 2014-04-09 LAB — GLUCOSE, CAPILLARY
GLUCOSE-CAPILLARY: 137 mg/dL — AB (ref 70–99)
GLUCOSE-CAPILLARY: 70 mg/dL (ref 70–99)
GLUCOSE-CAPILLARY: 136 mg/dL — AB (ref 70–99)
GLUCOSE-CAPILLARY: 154 mg/dL — AB (ref 70–99)
GLUCOSE-CAPILLARY: 156 mg/dL — AB (ref 70–99)
GLUCOSE-CAPILLARY: 119 mg/dL — AB (ref 70–99)
GLUCOSE-CAPILLARY: 106 mg/dL — AB (ref 70–99)
GLUCOSE-CAPILLARY: 198 mg/dL — AB (ref 70–99)
Glucose-Capillary: 101 mg/dL — ABNORMAL HIGH (ref 70–99)
Glucose-Capillary: 106 mg/dL — ABNORMAL HIGH (ref 70–99)
Glucose-Capillary: 110 mg/dL — ABNORMAL HIGH (ref 70–99)
Glucose-Capillary: 112 mg/dL — ABNORMAL HIGH (ref 70–99)
Glucose-Capillary: 113 mg/dL — ABNORMAL HIGH (ref 70–99)
Glucose-Capillary: 114 mg/dL — ABNORMAL HIGH (ref 70–99)
Glucose-Capillary: 119 mg/dL — ABNORMAL HIGH (ref 70–99)
Glucose-Capillary: 121 mg/dL — ABNORMAL HIGH (ref 70–99)
Glucose-Capillary: 122 mg/dL — ABNORMAL HIGH (ref 70–99)
Glucose-Capillary: 128 mg/dL — ABNORMAL HIGH (ref 70–99)
Glucose-Capillary: 131 mg/dL — ABNORMAL HIGH (ref 70–99)
Glucose-Capillary: 132 mg/dL — ABNORMAL HIGH (ref 70–99)
Glucose-Capillary: 136 mg/dL — ABNORMAL HIGH (ref 70–99)
Glucose-Capillary: 141 mg/dL — ABNORMAL HIGH (ref 70–99)
Glucose-Capillary: 153 mg/dL — ABNORMAL HIGH (ref 70–99)
Glucose-Capillary: 165 mg/dL — ABNORMAL HIGH (ref 70–99)
Glucose-Capillary: 166 mg/dL — ABNORMAL HIGH (ref 70–99)
Glucose-Capillary: 173 mg/dL — ABNORMAL HIGH (ref 70–99)
Glucose-Capillary: 183 mg/dL — ABNORMAL HIGH (ref 70–99)
Glucose-Capillary: 67 mg/dL — ABNORMAL LOW (ref 70–99)
Glucose-Capillary: 69 mg/dL — ABNORMAL LOW (ref 70–99)
Glucose-Capillary: 73 mg/dL (ref 70–99)
Glucose-Capillary: 98 mg/dL (ref 70–99)

## 2014-04-09 MED ORDER — INSULIN ASPART 100 UNIT/ML ~~LOC~~ SOLN
4.0000 [IU] | Freq: Three times a day (TID) | SUBCUTANEOUS | Status: DC
  Administered 2014-04-09 – 2014-04-10 (×3): 4 [IU] via SUBCUTANEOUS

## 2014-04-09 MED ORDER — SODIUM CHLORIDE 0.9 % IJ SOLN
10.0000 mL | Freq: Two times a day (BID) | INTRAMUSCULAR | Status: DC
  Administered 2014-04-09 (×2): 10 mL

## 2014-04-09 MED ORDER — INFLUENZA VAC SPLIT QUAD 0.5 ML IM SUSY
0.5000 mL | PREFILLED_SYRINGE | INTRAMUSCULAR | Status: AC
  Administered 2014-04-11: 0.5 mL via INTRAMUSCULAR
  Filled 2014-04-09: qty 0.5

## 2014-04-09 MED ORDER — INSULIN ASPART 100 UNIT/ML ~~LOC~~ SOLN
0.0000 [IU] | Freq: Three times a day (TID) | SUBCUTANEOUS | Status: DC
  Administered 2014-04-09: 3 [IU] via SUBCUTANEOUS
  Administered 2014-04-10: 8 [IU] via SUBCUTANEOUS
  Administered 2014-04-10: 15 [IU] via SUBCUTANEOUS

## 2014-04-09 MED ORDER — POTASSIUM CHLORIDE 10 MEQ/50ML IV SOLN
10.0000 meq | INTRAVENOUS | Status: AC
  Administered 2014-04-09 (×4): 10 meq via INTRAVENOUS
  Filled 2014-04-09 (×3): qty 50

## 2014-04-09 MED ORDER — SODIUM CHLORIDE 0.9 % IJ SOLN
10.0000 mL | INTRAMUSCULAR | Status: DC | PRN
  Administered 2014-04-10: 10 mL
  Administered 2014-04-10: 30 mL
  Administered 2014-04-10 – 2014-04-14 (×2): 10 mL

## 2014-04-09 MED ORDER — INSULIN GLARGINE 100 UNIT/ML ~~LOC~~ SOLN
9.0000 [IU] | Freq: Every day | SUBCUTANEOUS | Status: DC
  Administered 2014-04-10: 9 [IU] via SUBCUTANEOUS
  Filled 2014-04-09: qty 0.09

## 2014-04-09 MED ORDER — INSULIN GLARGINE 100 UNIT/ML ~~LOC~~ SOLN
14.0000 [IU] | Freq: Every day | SUBCUTANEOUS | Status: DC
  Administered 2014-04-09: 14 [IU] via SUBCUTANEOUS
  Filled 2014-04-09: qty 0.14

## 2014-04-09 NOTE — Care Management Note (Signed)
    Page 1 of 1   04/09/2014     3:28:34 PM CARE MANAGEMENT NOTE 04/09/2014  Patient:  [name], [name]   Account Number:  [id number]  Date Initiated:  04/09/2014  Documentation initiated by:  Montrice Montuori  Subjective/Objective Assessment:   dx DKA/hyperkalemia/cardiac arrest    PCP  [place]  CM consult      Status of service:  In process, will continue to follow  Per UR Regulation:  Reviewed for med. 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  Provided note as requested.

## 2014-04-09 NOTE — Progress Notes (Signed)
  Spoke with patient regarding sequence of events leading up to this incident/admission for DKA.[name] Pt very quiet and answers pretty much, "I don;t know" to my questions. When asked how often she checks her cbg's, she states usually 4-5 times per day. She states they usually run in the 300's to 400's. Asked her about using correction insulin, and she answered "I don't know."  Asked her if she had all her insulins, and she stated she used the discount card given to her last admission. She states she has all she needs. Asked her if she had been feeling bad the previous days and did her glucose rise higher-she stated she 'didn't know'. Asked who she calls when she feels bad, and she answers that she calls her mother. Her " boyfriend"  (as he states himself to be) states that she didn't say anything about feeling bad prior to admission. Boyfriend acted annoyed with my questions and looked away from me without further conversation.  Asked pt if she went to her appt with [facility] and she states that she did. Chart review shows that she did go to her appt on 04/04/14. Followed by telephone appt the following day.  If pt is running in 300's to 400's continuously without correction,  she would be very apt to develop DKA rather easily and quickly. Noted pt ordered lantus 15 units and 3 units meal coverage tidwc. No note of any instruction or correction scale for elevated glucose. Mother not in the room. Truly unable to obtain much information at all. Will follow and assist in any way. Glad to assist with a home correction insulin scale if she truly does not have one or use one.  Thank you, Rosita Kea, RN, CNS, Diabetes Coordinator [phone])

## 2014-04-09 NOTE — Progress Notes (Signed)
Pt was lying in bed and awake when I arrived. Pt's father was bedside. Spoke briefly w/pt introducing myself and told her I was on-call when she came in and that her mother and I grew up in church together. Celebrated with pt that she is better (w/o vent) and encouraged her to continue to get btr and take care of herself. Pt's father shook his head in agreement. Pt shook her head, ok. Visit was brief as pt seemed tired. Ernest Haber Chaplain  04/09/14 1800  Clinical Encounter Type  Visited With Patient and family together

## 2014-04-09 NOTE — Progress Notes (Signed)
Dr Melvyn Novas called with anion gap 15 .orders to hold on transfer

## 2014-04-09 NOTE — Progress Notes (Signed)
Received report @ 2150. Patient transfer was held up because MD wanted to make sure patient was suitable for 5west floor.

## 2014-04-09 NOTE — Progress Notes (Signed)
  Pt admitted to the unit. Pt is stable, alert and oriented per baseline. Oriented to room, staff, and call bell. Educated to call for any assistance. Bed in lowest position, call bell within reach- will continue to monitor. 

## 2014-04-09 NOTE — Progress Notes (Signed)
[facility] Progress Note Patient Name: [name] DOB: [birth year] MRN: [id number]   Date of Service  04/09/2014  HPI/Events of Note  Hypokalemia  eICU Interventions  Potassium replaced     Intervention Category Intermediate Interventions: Electrolyte abnormality - evaluation and management  DETERDING,ELIZABETH 04/09/2014, 12:25 AM

## 2014-04-09 NOTE — Progress Notes (Signed)
[facility] ADULT ICU REPLACEMENT PROTOCOL FOR AM LAB REPLACEMENT ONLY  The patient does not apply for the [facility] Adult ICU Electrolyte Replacment Protocol based on the criteria listed below:   IV Potassium infusion still in process, total to give incomplete at time of draw.    Abnormal electrolyte(s):K3.6   If a panic level lab has been reported, has the CCM MD in charge been notified? Yes.  .   Physician:  E Deterding,MD  Vear Clock 04/09/2014 5:33 AM

## 2014-04-09 NOTE — Progress Notes (Signed)
PULMONARY / CRITICAL CARE MEDICINE   Name: [name] MRN: [id number] DOB: Nov 11, 1990    ADMISSION DATE:  04/07/2014   REFERRING MD :  EDP  CHIEF COMPLAINT:  Post arrest   INITIAL PRESENTATION:  73F admitted with very severe DKA and s/p prolonged cardiac arrest (reportedly > 20 mins CPR) due to hyperkalemia  STUDIES:  TTE 10/19 >> normal LV fxn, normal PAP's  SIGNIFICANT EVENTS:  INDWELLING DEVICES:: ETT 10/18 >>  L [place] CVL 10/18 >>   MICRO DATA:   ANTIMICROBIALS:    SUBJECTIVE:  More awake Hungry Denies pain   VITAL SIGNS: Temp:  [96.7 F (35.9 C)-99.5 F (37.5 C)] 99.5 F (37.5 C) (10/20 0900) Pulse Rate:  [104-115] 108 (10/20 0900) Resp:  [10-31] 12 (10/20 0900) BP: (72-119)/(44-89) 102/57 mmHg (10/20 0900) SpO2:  [92 %-100 %] 97 % (10/20 0900) Weight:  [52.1 kg (114 lb 13.8 oz)] 52.1 kg (114 lb 13.8 oz) (10/20 0600) HEMODYNAMICS: CVP:  [0 mmHg-9 mmHg] 3 mmHg VENTILATOR SETTINGS:   INTAKE / OUTPUT:  Intake/Output Summary (Last 24 hours) at 04/09/14 0946 Last data filed at 04/09/14 0900  Gross per 24 hour  Intake 3116.31 ml  Output   1110 ml  Net 2006.31 ml    PHYSICAL EXAMINATION: General: WDWN, comfortable, more interactive Neuro:  Awake and following commands, MAEs, PERRL HEENT: NCAT Cardiovascular: Regular, hyperdynamic, no M Lungs: clear to auscultation Abdomen: tender to palp in all quad's, no rebound  Ext: no edema, symmetric pulses; L LE tender to palp pre-tibial region.  Skin: no lesions noted  LABS:  CBC  Recent Labs Lab 04/07/14 1554 04/07/14 1941 04/08/14 0400 04/09/14 0600  WBC 11.8*  --  7.0 8.8  HGB 9.2* 10.5* 8.0* 7.2*  HCT 25.5* 31.0* 22.8* 20.1*  PLT 210  --  178 136*   Coag's No results found for this basename: APTT, INR,  in the last 168 hours BMET  Recent Labs Lab 04/08/14 2200 04/09/14 0200 04/09/14 0600  NA 146 150* 148*  K 3.3* 3.6* 3.6*  CL 115* 120* 117*  CO2 20 20 20   BUN 32* 29*  25*  CREATININE 1.76* 1.71* 1.65*  GLUCOSE 146* 118* 117*   Electrolytes  Recent Labs Lab 04/08/14 2200 04/09/14 0200 04/09/14 0600  CALCIUM 7.6* 7.6* 7.7*   Sepsis Markers  Recent Labs Lab 04/07/14 1433 04/07/14 2314  LATICACIDVEN 6.87* 4.1*   ABG  Recent Labs Lab 04/07/14 1442  PHART 7.006*  PCO2ART 36.6  PO2ART 567.0*   Liver Enzymes  Recent Labs Lab 04/07/14 1436  AST 337*  ALT 198*  ALKPHOS 113  BILITOT <0.2*  ALBUMIN 2.3*   Cardiac Enzymes  Recent Labs Lab 04/08/14 0400  TROPONINI 0.47*   Glucose  Recent Labs Lab 04/09/14 0105 04/09/14 0207 04/09/14 0310 04/09/14 0408 04/09/14 0517 04/09/14 0709  GLUCAP 128* 122* 110* 119* 119* 106*    Imaging Dg Chest Port 1 View  04/08/2014   CLINICAL DATA:  Respiratory failure, followup, personal history of diabetes mellitus  EXAM: PORTABLE CHEST - 1 VIEW  COMPARISON:  Portable exam 0551 hr compared to 04/07/2014  FINDINGS: Tip of endotracheal tube projects 4.8 cm above carina.  Nasogastric tube extends into stomach.  LEFT subclavian central venous catheter tip projects over mid SVC.  Numerous EKG leads and external pacing leads project over chest.  Normal heart size, mediastinal contours, and pulmonary vascularity.  Lungs clear.  Skin fold projects over LEFT chest.  No pleural effusion or pneumothorax.  IMPRESSION: Line and tube positions as above.  No acute abnormalities.   Electronically Signed   By: Lavonia Dana M.D.   On: 04/08/2014 07:53     ASSESSMENT / PLAN: ENDOCRINE A: DM 1 Very severe DKA suspect due to poor DM management, improved P:   DKA protocol finishing, AG closed > transition to 1/2 of her home regimen (usually on lantus 17u) until good PO intake Decrease IVF and start diet 10/20  CARDIOVASCULAR Prolonged asystolic arrest - likely due to hyperkalemia Transient AF after ROSC > sinus rhythm with extrasystoles Shock, suspect hypovolemic. TTE reassuring 10/19 P:  Volume  resuscitation as above Turn phenylephrine off 10/20  RENAL Severe AG acidosis due to DKA, lactic acidosis AKI, nonoliguric >  Severe hyperkalemia, resolved Hypercalcemia P:   Monitor BMET q12h Monitor I/Os  PULMONARY Acute respiratory failure post cardiac arrest P:   Tolerated extubation, pulm toilet  GASTROINTESTINAL A: ? Bloody gastric aspirate, resolved P:   SUP: IV PPI   HEMATOLOGIC A: Very mild anemia P:  DVT px: SCDs until ambulating Monitor CBC intermittently Transfuse per usual ICU guidelines  INFECTIOUS A: No overt infection P:   Monitoring off abx  NEUROLOGIC A: Anoxic encephalopathy, improved.  P:   RASS goal: 0   Family updated: spoke with parents on 10/19   TODAY'S SUMMARY: Severe DKA, hyperkalemia, s/p arrest. Markedly improved. Transitioning off insulin gtt 10/20, transfer to floor bed 10/20 and hopefully home soon.   I have personally obtained a history, examined the patient, evaluated laboratory and imaging results, formulated the assessment and plan and placed orders.     Baltazar Apo, MD, PhD 04/09/2014, 9:46 AM [facility] [phone] or if no answer [phone]

## 2014-04-09 NOTE — Evaluation (Signed)
Physical Therapy Evaluation Patient Details Name: [name] MRN: [id number] DOB: 1991-03-24 Today's Date: 04/09/2014   History of Present Illness  14F admitted with very severe DKA and s/p prolonged cardiac arrest (reportedly > 20 mins CPR) due to hyperkalemia  Clinical Impression  Pt very soft spoken with limited desire for mobility at this time coupled with symptomatic orthostatic hypotension. Pt with decreased mobility, strength, gait and function who will benefit from acute therapy to maximize mobility and independence to decrease burden of care. Pt encouraged to be OOB for meals and use of RW to off weight painful RLE. Hopeful for quick progression with mobility.     Follow Up Recommendations Home health PT;Supervision/Assistance - 24 hour    Equipment Recommendations  Rolling walker with 5" wheels    Recommendations for Other Services OT consult     Precautions / Restrictions Precautions Precautions: Fall Precaution Comments: watch bp      Mobility  Bed Mobility Overal bed mobility: Needs Assistance Bed Mobility: Sit to Supine       Sit to supine: Min assist   General bed mobility comments: cues for sequence with assist to clear bil LE into bed  Transfers Overall transfer level: Needs assistance   Transfers: Sit to/from Stand;Stand Pivot Transfers Sit to Stand: Min guard         General transfer comment: cues for hand placement, posture, position in RW and use of RW to off weight RLE . Pt with orthostatic hypotension in standing  Ambulation/Gait Ambulation/Gait assistance:  (unable secondary to bP)              Stairs            Wheelchair Mobility    Modified Rankin (Stroke Patients Only)       Balance Overall balance assessment: Needs assistance   Sitting balance-Leahy Scale: Good       Standing balance-Leahy Scale: Fair                               Pertinent Vitals/Pain Pain Assessment: 0-10 Pain  Score: 4  Pain Location: chest pain and right ankle in standing Pain Descriptors / Indicators: Aching Pain Intervention(s): Repositioned;Premedicated before session BP in sitting 98/60 Standing 85/43 Standing after 2 min 81/50 Return to sitting 92/56    Home Living Family/patient expects to be discharged to:: Private residence Living Arrangements: Non-relatives/Friends Available Help at Discharge: Family;Available 24 hours/day (for limited time) Type of Home: Apartment Home Access: Level entry     Home Layout: One level Home Equipment: None Additional Comments: Pt lives in an apt with roommates first floor, parents house is single story with 2-3 steps    Prior Function Level of Independence: Independent               Hand Dominance        Extremity/Trunk Assessment   Upper Extremity Assessment: Generalized weakness           Lower Extremity Assessment: Generalized weakness      Cervical / Trunk Assessment: Normal  Communication   Communication: No difficulties (very soft spoken)  Cognition Arousal/Alertness: Awake/alert Behavior During Therapy: Flat affect Overall Cognitive Status: Impaired/Different from baseline Area of Impairment: Orientation Orientation Level: Time                  General Comments      Exercises        Assessment/Plan  PT Assessment Patient needs continued PT services  PT Diagnosis Difficulty walking;Generalized weakness   PT Problem List Decreased strength;Decreased activity tolerance;Decreased mobility;Cardiopulmonary status limiting activity;Pain;Decreased knowledge of use of DME  PT Treatment Interventions Gait training;DME instruction;Functional mobility training;Stair training;Therapeutic activities;Therapeutic exercise;Patient/family education   PT Goals (Current goals can be found in the Care Plan section) Acute Rehab PT Goals Patient Stated Goal: return to finish school PT Goal Formulation: With  patient Time For Goal Achievement: 04/23/14 Potential to Achieve Goals: Good    Frequency Min 3X/week   Barriers to discharge Decreased caregiver support      Co-evaluation               End of Session Equipment Utilized During Treatment: Gait belt Activity Tolerance: Patient limited by fatigue;Treatment limited secondary to medical complications (Comment) Patient left: in bed;with call bell/phone within [name];with family/visitor present Nurse Communication: Mobility status;Precautions         Time: 1230-1250 PT Time Calculation (min): 20 min   Charges:   PT Evaluation $Initial PT Evaluation Tier I: 1 Procedure PT Treatments $Therapeutic Activity: 8-22 mins   PT G CodesMelford [name] 04/09/2014, 12:59 PM [name], [place]

## 2014-04-10 LAB — BASIC METABOLIC PANEL
ANION GAP: 16 — AB (ref 5–15)
ANION GAP: 14 (ref 5–15)
Anion gap: 14 (ref 5–15)
Anion gap: 14 (ref 5–15)
Anion gap: 10 (ref 5–15)
BUN: 19 mg/dL (ref 6–23)
BUN: 19 mg/dL (ref 6–23)
BUN: 18 mg/dL (ref 6–23)
BUN: 17 mg/dL (ref 6–23)
BUN: 15 mg/dL (ref 6–23)
CALCIUM: 8.4 mg/dL (ref 8.4–10.5)
CHLORIDE: 110 meq/L (ref 96–112)
CHLORIDE: 112 meq/L (ref 96–112)
CHLORIDE: 114 meq/L — AB (ref 96–112)
CO2: 18 mEq/L — ABNORMAL LOW (ref 19–32)
CO2: 17 mEq/L — ABNORMAL LOW (ref 19–32)
CO2: 18 meq/L — AB (ref 19–32)
CO2: 20 meq/L (ref 19–32)
CO2: 22 meq/L (ref 19–32)
Calcium: 8.1 mg/dL — ABNORMAL LOW (ref 8.4–10.5)
Calcium: 8.5 mg/dL (ref 8.4–10.5)
Calcium: 8.1 mg/dL — ABNORMAL LOW (ref 8.4–10.5)
Calcium: 8.3 mg/dL — ABNORMAL LOW (ref 8.4–10.5)
Chloride: 111 mEq/L (ref 96–112)
Chloride: 111 mEq/L (ref 96–112)
Creatinine, Ser: 1.45 mg/dL — ABNORMAL HIGH (ref 0.50–1.10)
Creatinine, Ser: 1.43 mg/dL — ABNORMAL HIGH (ref 0.50–1.10)
Creatinine, Ser: 1.4 mg/dL — ABNORMAL HIGH (ref 0.50–1.10)
Creatinine, Ser: 1.38 mg/dL — ABNORMAL HIGH (ref 0.50–1.10)
Creatinine, Ser: 1.34 mg/dL — ABNORMAL HIGH (ref 0.50–1.10)
GFR calc Af Amer: 61 mL/min — ABNORMAL LOW (ref >90)
GFR calc Af Amer: 62 mL/min — ABNORMAL LOW (ref >90)
GFR calc Af Amer: 64 mL/min — ABNORMAL LOW (ref >90)
GFR calc non Af Amer: 52 mL/min — ABNORMAL LOW (ref >90)
GFR calc non Af Amer: 53 mL/min — ABNORMAL LOW (ref >90)
GFR calc non Af Amer: 55 mL/min — ABNORMAL LOW (ref >90)
GFR, EST AFRICAN AMERICAN: 58 mL/min — AB (ref >90)
GFR, EST AFRICAN AMERICAN: 59 mL/min — AB (ref >90)
GFR, EST NON AFRICAN AMERICAN: 50 mL/min — AB (ref >90)
GFR, EST NON AFRICAN AMERICAN: 51 mL/min — AB (ref >90)
GLUCOSE: 315 mg/dL — AB (ref 70–99)
GLUCOSE: 269 mg/dL — AB (ref 70–99)
GLUCOSE: 144 mg/dL — AB (ref 70–99)
Glucose, Bld: 356 mg/dL — ABNORMAL HIGH (ref 70–99)
Glucose, Bld: 411 mg/dL — ABNORMAL HIGH (ref 70–99)
POTASSIUM: 4.1 meq/L (ref 3.7–5.3)
POTASSIUM: 3.6 meq/L — AB (ref 3.7–5.3)
Potassium: 4.4 mEq/L (ref 3.7–5.3)
Potassium: 3.9 mEq/L (ref 3.7–5.3)
Potassium: 4 mEq/L (ref 3.7–5.3)
SODIUM: 144 meq/L (ref 137–147)
SODIUM: 142 meq/L (ref 137–147)
SODIUM: 146 meq/L (ref 137–147)
SODIUM: 146 meq/L (ref 137–147)
Sodium: 143 mEq/L (ref 137–147)

## 2014-04-10 LAB — GLUCOSE, CAPILLARY
GLUCOSE-CAPILLARY: 411 mg/dL — AB (ref 70–99)
GLUCOSE-CAPILLARY: 317 mg/dL — AB (ref 70–99)
Glucose-Capillary: 343 mg/dL — ABNORMAL HIGH (ref 70–99)
Glucose-Capillary: 282 mg/dL — ABNORMAL HIGH (ref 70–99)
Glucose-Capillary: 269 mg/dL — ABNORMAL HIGH (ref 70–99)

## 2014-04-10 MED ORDER — OXYCODONE-ACETAMINOPHEN 5-325 MG PO TABS
1.0000 | ORAL_TABLET | Freq: Four times a day (QID) | ORAL | Status: DC | PRN
  Administered 2014-04-10 – 2014-04-15 (×8): 1 via ORAL
  Filled 2014-04-10 (×8): qty 1

## 2014-04-10 MED ORDER — DEXTROSE 50 % IV SOLN: 25.0000 mL | INTRAVENOUS | Status: DC | PRN

## 2014-04-10 MED ORDER — HEPARIN SODIUM (PORCINE) 5000 UNIT/ML IJ SOLN
5000.0000 [IU] | Freq: Three times a day (TID) | INTRAMUSCULAR | Status: DC
  Administered 2014-04-10 – 2014-04-11 (×2): 5000 [IU] via SUBCUTANEOUS
  Filled 2014-04-10 (×4): qty 1

## 2014-04-10 MED ORDER — SODIUM CHLORIDE 0.9 % IV SOLN
INTRAVENOUS | Status: DC
  Administered 2014-04-10: 2.6 [IU]/h via INTRAVENOUS
  Filled 2014-04-10 (×2): qty 2.5

## 2014-04-10 MED ORDER — POTASSIUM CHLORIDE 10 MEQ/100ML IV SOLN
10.0000 meq | INTRAVENOUS | Status: AC
  Filled 2014-04-10 (×2): qty 100

## 2014-04-10 MED ORDER — PANTOPRAZOLE SODIUM 40 MG IV SOLR
40.0000 mg | INTRAVENOUS | Status: DC
  Filled 2014-04-10 (×2): qty 40

## 2014-04-10 MED ORDER — ACETAMINOPHEN 325 MG PO TABS
650.0000 mg | ORAL_TABLET | Freq: Four times a day (QID) | ORAL | Status: DC | PRN
  Administered 2014-04-10 – 2014-04-12 (×2): 650 mg via ORAL
  Filled 2014-04-10 (×2): qty 2

## 2014-04-10 MED ORDER — SODIUM CHLORIDE 0.9 % IV SOLN
INTRAVENOUS | Status: DC
  Administered 2014-04-10: 17:00:00 via INTRAVENOUS

## 2014-04-10 MED ORDER — DEXTROSE-NACL 5-0.45 % IV SOLN
INTRAVENOUS | Status: DC
  Administered 2014-04-10 – 2014-04-12 (×3): via INTRAVENOUS

## 2014-04-10 NOTE — Progress Notes (Addendum)
Inpatient Diabetes Program Recommendations  AACE/ADA: New Consensus Statement on Inpatient Glycemic Control (2013)  Target Ranges:  Prepandial:   less than 140 mg/dL      Peak postprandial:   less than 180 mg/dL (1-2 hours)      Critically ill patients:  140 - 180 mg/dL   Reason for Assessment:  Results for [name], [name] (MRN ID:[id number]) as of 04/10/2014 15:06  Ref. Range 04/10/2014 05:31 04/10/2014 08:06 04/10/2014 09:31 04/10/2014 12:14  Glucose-Capillary Latest Range: 70-99 mg/dL 343 (H) 411 (H)  282 (H)  Note plans for patient to be placed back on insulin drip due to widening of AG.  Spoke to patient and mother at bedside regarding events that led up too DKA/arrest.  According to patient's mother, [name] had not been able to eat for a week or more and thus did not take insulin?  After further questioning the patient said that she did take the Lantus but not the Novolog because she could not eat.  She states that her CBG's were up and down.  Patient was vague with answers and made very little eye contact.  Mother states that patient will be taking a leave of absence from school and going home with her family to [place] until next semester.  She states that she did see patient on Saturday, October 17 and that she was very weak.  She states that she was unable to get a blood sugar on her daughter bc she could not get any blood.  Note that patient was found on Sunday, October 18. Briefly discussed importance of monitoring and taking insulin even when sick. Patient still does not have insurance and therefor plans to follow-up at the [facility].  Mother states that they did call the clinic last week when [name] was unable to keep food down, however did not get a call back? She has 2 Lantus pens left.  Will need to make sure that she has access to medications at discharge also. May benefit from psych consult while in the hospital?  Discussed with PA.  Will follow.  This family needs lots of support and  education to make sure that patient is caring for self appropriately.    Adah Perl, RN, BC-ADM Inpatient Diabetes Coordinator Pager [phone]   Addendum: Upon transition off insulin drip, consider adding another dose of Lantus 10 units 2 hours prior to discontinuation insulin drip (this would total 19 units of Lantus for 04/10/14).  Also consider q 4 hour sensitive correction once insulin drip stopped.  Also will need Novolog meal coverage for meals.

## 2014-04-10 NOTE — Progress Notes (Signed)
PULMONARY / CRITICAL CARE MEDICINE   Name: [name] MRN: [id number] DOB: 05-29-1991    ADMISSION DATE:  04/07/2014   REFERRING MD :  EDP  CHIEF COMPLAINT:  Post arrest   INITIAL PRESENTATION:  54F admitted with very severe DKA and s/p prolonged cardiac arrest (reportedly > 20 mins CPR) due to hyperkalemia.  STUDIES:  TTE 10/19 >> normal LV fxn, normal PAP's  SIGNIFICANT EVENTS: 10/18 - admitted after cardiac arrest in setting hyperkalemia secondary to severe DKA 10/19 - extubated 10/20 - transferred out of ICU to regular floor. 10/21 - started back on insulin gtt due to AG increasing  INDWELLING DEVICES:: ETT 10/18 >>  L [place] CVL 10/18 >>   MICRO DATA:   ANTIMICROBIALS:    SUBJECTIVE:  Awake, no complaints.  Slightly hypotensive (90/40) manually.  Pt asymptomatic.  Some chest discomfort after CPR.  Denies SOB, N/V/D, abdominal pain.  Does report some ankle pain after suffering fall prior to admit (Xray negative for fracture).  VITAL SIGNS: Temp:  [98.6 F (37 C)-100.5 F (38.1 C)] 100.5 F (38.1 C) (10/21 1344) Pulse Rate:  [105-119] 105 (10/21 1344) Resp:  [16-26] 20 (10/21 1344) BP: (76-109)/(34-71) 90/40 mmHg (10/21 1353) SpO2:  [95 %-99 %] 97 % (10/21 1344) Weight:  [52.1 kg (114 lb 13.8 oz)] 52.1 kg (114 lb 13.8 oz) (10/20 2240) HEMODYNAMICS:   VENTILATOR SETTINGS:   INTAKE / OUTPUT:  Intake/Output Summary (Last 24 hours) at 04/10/14 1405 Last data filed at 04/10/14 0950  Gross per 24 hour  Intake    370 ml  Output   1202 ml  Net   -832 ml    PHYSICAL EXAMINATION: General: Young female, WDWN, in NAD, flat affect. Neuro:  Awake and following commands, MAEs HEENT: NCAT, PERRL, MM dry Cardiovascular: Tachy but regular, no M/R/G. Lungs: CTA, no W/R/R. Abdomen: BS x 4, soft, NT/ND. Ext: no edema, symmetric pulses; R LE tender at lateral malleolus Skin: Warm, dry, no rashes.  LABS:  CBC  Recent Labs Lab 04/07/14 1554 04/07/14 1941  04/08/14 0400 04/09/14 0600  WBC 11.8*  --  7.0 8.8  HGB 9.2* 10.5* 8.0* 7.2*  HCT 25.5* 31.0* 22.8* 20.1*  PLT 210  --  178 136*   Coag's No results found for this basename: APTT, INR,  in the last 168 hours BMET  Recent Labs Lab 04/09/14 1716 04/10/14 0420 04/10/14 0931  NA 143 143 144  K 4.2 4.4 3.9  CL 113* 111 111  CO2 15* 18* 17*  BUN 21 19 19   CREATININE 1.49* 1.45* 1.43*  GLUCOSE 262* 356* 411*   Electrolytes  Recent Labs Lab 04/09/14 1716 04/10/14 0420 04/10/14 0931  CALCIUM 7.9* 8.1* 8.5   Sepsis Markers  Recent Labs Lab 04/07/14 1433 04/07/14 2314  LATICACIDVEN 6.87* 4.1*   ABG  Recent Labs Lab 04/07/14 1442  PHART 7.006*  PCO2ART 36.6  PO2ART 567.0*   Liver Enzymes  Recent Labs Lab 04/07/14 1436  AST 337*  ALT 198*  ALKPHOS 113  BILITOT <0.2*  ALBUMIN 2.3*   Cardiac Enzymes  Recent Labs Lab 04/08/14 0400  TROPONINI 0.47*   Glucose  Recent Labs Lab 04/09/14 1211 04/09/14 1618 04/09/14 2157 04/10/14 0531 04/10/14 0806 04/10/14 1214  GLUCAP 165* 198* 183* 343* 411* 282*    Imaging Dg Ankle Right Port  04/09/2014   CLINICAL DATA:  Pain and swelling involving lateral malleolus of right ankle after fall 2 days ago.  EXAM: PORTABLE RIGHT ANKLE -  2 VIEW  COMPARISON:  None.  FINDINGS: No acute fracture or dislocation is identified. The ankle mortise shows normal alignment. Mild soft tissue swelling present. No significant arthropathy. No bony lesions or destruction.  IMPRESSION: No acute fracture.   Electronically Signed   By: Aletta Edouard M.D.   On: 04/09/2014 20:21    ASSESSMENT / PLAN:  ENDOCRINE A: DM 1 Very severe DKA suspect due to poor DM management - had been improving; however, AG increasing as of 10/21. P:   Needs to be placed back on insulin gtt so will restart DKA protocol. Fluids per DKA protocol.  CARDIOVASCULAR Prolonged asystolic arrest - likely due to severe hyperkalemia secondary to  DKA Transient AF after ROSC > sinus rhythm with extrasystoles Shock, suspect hypovolemic. TTE reassuring 10/19 Hypotension 10/21 - suspect likely from hypovolemia P:  Fluids per DKA protocol. Monitor hemodynamics.  RENAL Severe AG acidosis due to DKA, lactic acidosis AKI, nonoliguric  Severe hyperkalemia, resolved Hypercalcemia P:   BMP's q2hrs while on DKA protocol. Monitor I/Os.  PULMONARY Acute respiratory failure post cardiac arrest - resolved P:   Continue pulmonary toiletry / pulmonary hygiene.  GASTROINTESTINAL A: ? Bloody gastric aspirate, resolved GI prophylaxis P:   Protonix.  HEMATOLOGIC A: Anemia VTE prophylaxis P:  Transfuse for Hgb < 7. SCDs / heparin. CBC in AM.  INFECTIOUS A: Fever  P:   Monitor clinically, no abx for now.  NEUROLOGIC A: Anoxic encephalopathy, improving though still has very flat affect on exam. P:   Consider CT if no improvement (per diabetes educator, pt appears very different this admission compared to when she last saw her).   Family updated: spoke with mother and sisters at bedside 10/21.   TODAY'S SUMMARY: Severe DKA, hyperkalemia, s/p arrest. Markedly improved 10/20 and transferred to floor.  AG started to increase again and worse on 10/21 therefore placed back on insulin gtt and DKA protocol.  Will tx back to SDU. Home regimen needs to be adjusted prior to d/c.   Montey Hora, [facility] Pgr: [phone]  or [phone] 04/10/2014, 2:34 PM  Baltazar Apo, MD, PhD 04/10/2014, 3:06 PM [facility] [phone] or if no answer [phone]

## 2014-04-10 NOTE — Progress Notes (Signed)
CARE MANAGEMENT NOTE 04/10/2014  Patient:  [name], [name]   Account Number:  [id number]  Date Initiated:  04/09/2014  Documentation initiated by:  MAYO,HENRIETTA  Subjective/Objective Assessment:   dx DKA/hyperkalemia/cardiac arrest    PCP  Lorayne Marek     Action/Plan:   Anticipated DC Date:  04/11/2014   Anticipated DC Plan:  [place]  CM consult  Follow-up appt scheduled      Choice offered to / List presented to:             Status of service:  In process, will continue to follow Medicare Important Message given?  NO (If response is "NO", the following Medicare IM given date fields will be blank) Date Medicare IM given:   Medicare IM given by:   Date Additional Medicare IM given:   Additional Medicare IM given by:    Discharge Disposition:    Per UR Regulation:  Reviewed for med. necessity/level of care/duration of stay  If discussed at [place] of Stay Meetings, dates discussed:    Comments:  04/10/14 2022 Tomi Bamberger RN BSN [phone] patient has f/u apt at [facility] clinic.  She will be able to pick up mediations at the [facility] clinic at dc if before weekend.  04/09/14 1200 [place] MSN BSN CCM Pt is a student @ [employer] and mother requests note on [facility] letterhead re pt's hospitalization so she will be excused from classes.  Provided note as requested.

## 2014-04-10 NOTE — Progress Notes (Signed)
Inpatient Diabetes Program Recommendations  AACE/ADA: New Consensus Statement on Inpatient Glycemic Control (2013)  Target Ranges:  Prepandial:   less than 140 mg/dL      Peak postprandial:   less than 180 mg/dL (1-2 hours)      Critically ill patients:  140 - 180 mg/dL   Reason for Assessment:  Results for [name], [name] (MRN [id number]) as of 04/10/2014 09:09  Ref. Range 04/09/2014 21:57 04/10/2014 04:20 04/10/2014 05:31 04/10/2014 08:06  Glucose-Capillary Latest Range: 70-99 mg/dL 183 (H)  343 (H) 411 (H)   Note that patient was transitioned off insulin drip on 04/09/14.  She received 14 units of Lantus 2 hours prior to d/c insulin drip.  Please consider increasing Lantus to 20 units daily (Consider starting this morning).  Also consider reducing correction to sensitive but increase frequency to every 4 hours. Will speak with patient and hopefully mother today regarding plan of care for diabetes.  Thanks, Adah Perl, RN, BC-ADM Inpatient Diabetes Coordinator Pager [phone]

## 2014-04-10 NOTE — Clinical Documentation Improvement (Signed)
04/10/14 Possible Clinical Conditions?    Expected Acute Blood Loss Anemia  Acute Blood Loss Anemia  Acute on chronic blood loss anemia  Chronic blood loss anemia  Precipitous drop in Hematocrit  Other Condition  Cannot Clinically Determine   Risk Factors:  Very mild anemia per 10/20 progress notes.   Diagnostics: H&H on 10/20:  7.2/20.1. H&H on 10/18: 11.6/34.0    Thank You, Darrel Hoover, BSN, Clinical Documentation Specialist:  [place] [facility]

## 2014-04-10 NOTE — Progress Notes (Signed)
Pt transferred to 2c01 via bed. Pt transferred with belongings. Luellen Pucker, dayshift RN called report to [facility] nurse. Ranelle Oyster, RN Hydrographic surveyor)

## 2014-04-10 NOTE — Progress Notes (Signed)
Paged pccm regarding update on cbg

## 2014-04-10 NOTE — Progress Notes (Signed)
Utilization review completed.  

## 2014-04-11 DIAGNOSIS — M25469 Effusion, unspecified knee: Secondary | ICD-10-CM

## 2014-04-11 DIAGNOSIS — R509 Fever, unspecified: Secondary | ICD-10-CM

## 2014-04-11 LAB — MAGNESIUM: Magnesium: 1.7 mg/dL (ref 1.5–2.5)

## 2014-04-11 LAB — GLUCOSE, CAPILLARY
GLUCOSE-CAPILLARY: 160 mg/dL — AB (ref 70–99)
GLUCOSE-CAPILLARY: 215 mg/dL — AB (ref 70–99)
GLUCOSE-CAPILLARY: 152 mg/dL — AB (ref 70–99)
GLUCOSE-CAPILLARY: 137 mg/dL — AB (ref 70–99)
GLUCOSE-CAPILLARY: 166 mg/dL — AB (ref 70–99)
GLUCOSE-CAPILLARY: 263 mg/dL — AB (ref 70–99)
GLUCOSE-CAPILLARY: 225 mg/dL — AB (ref 70–99)
Glucose-Capillary: 128 mg/dL — ABNORMAL HIGH (ref 70–99)
Glucose-Capillary: 129 mg/dL — ABNORMAL HIGH (ref 70–99)
Glucose-Capillary: 154 mg/dL — ABNORMAL HIGH (ref 70–99)
Glucose-Capillary: 175 mg/dL — ABNORMAL HIGH (ref 70–99)
Glucose-Capillary: 193 mg/dL — ABNORMAL HIGH (ref 70–99)
Glucose-Capillary: 196 mg/dL — ABNORMAL HIGH (ref 70–99)
Glucose-Capillary: 191 mg/dL — ABNORMAL HIGH (ref 70–99)
Glucose-Capillary: 206 mg/dL — ABNORMAL HIGH (ref 70–99)
Glucose-Capillary: 261 mg/dL — ABNORMAL HIGH (ref 70–99)

## 2014-04-11 LAB — BASIC METABOLIC PANEL
ANION GAP: 12 (ref 5–15)
ANION GAP: 10 (ref 5–15)
Anion gap: 9 (ref 5–15)
BUN: 14 mg/dL (ref 6–23)
BUN: 13 mg/dL (ref 6–23)
BUN: 13 mg/dL (ref 6–23)
CALCIUM: 8.1 mg/dL — AB (ref 8.4–10.5)
CALCIUM: 8 mg/dL — AB (ref 8.4–10.5)
CHLORIDE: 114 meq/L — AB (ref 96–112)
CHLORIDE: 110 meq/L (ref 96–112)
CO2: 22 meq/L (ref 19–32)
CO2: 20 mEq/L (ref 19–32)
CO2: 22 mEq/L (ref 19–32)
CREATININE: 1.24 mg/dL — AB (ref 0.50–1.10)
Calcium: 8.2 mg/dL — ABNORMAL LOW (ref 8.4–10.5)
Chloride: 109 mEq/L (ref 96–112)
Creatinine, Ser: 1.19 mg/dL — ABNORMAL HIGH (ref 0.50–1.10)
Creatinine, Ser: 1.2 mg/dL — ABNORMAL HIGH (ref 0.50–1.10)
GFR calc Af Amer: 70 mL/min — ABNORMAL LOW (ref >90)
GFR calc Af Amer: 74 mL/min — ABNORMAL LOW (ref >90)
GFR calc Af Amer: 73 mL/min — ABNORMAL LOW (ref >90)
GFR calc non Af Amer: 61 mL/min — ABNORMAL LOW (ref >90)
GFR calc non Af Amer: 64 mL/min — ABNORMAL LOW (ref >90)
GFR calc non Af Amer: 63 mL/min — ABNORMAL LOW (ref >90)
Glucose, Bld: 165 mg/dL — ABNORMAL HIGH (ref 70–99)
Glucose, Bld: 296 mg/dL — ABNORMAL HIGH (ref 70–99)
Glucose, Bld: 227 mg/dL — ABNORMAL HIGH (ref 70–99)
Potassium: 3.5 mEq/L — ABNORMAL LOW (ref 3.7–5.3)
Potassium: 3.6 mEq/L — ABNORMAL LOW (ref 3.7–5.3)
Potassium: 3.9 mEq/L (ref 3.7–5.3)
SODIUM: 142 meq/L (ref 137–147)
SODIUM: 141 meq/L (ref 137–147)
Sodium: 145 mEq/L (ref 137–147)

## 2014-04-11 LAB — CBC
HEMATOCRIT: 18.5 % — AB (ref 36.0–46.0)
HEMOGLOBIN: 6.3 g/dL — AB (ref 12.0–15.0)
MCH: 25.8 pg — ABNORMAL LOW (ref 26.0–34.0)
MCHC: 34.1 g/dL (ref 30.0–36.0)
MCV: 75.8 fL — AB (ref 78.0–100.0)
Platelets: 103 10*3/uL — ABNORMAL LOW (ref 150–400)
RBC: 2.44 MIL/uL — AB (ref 3.87–5.11)
RDW: 14.5 % (ref 11.5–15.5)
WBC: 5.7 10*3/uL (ref 4.0–10.5)

## 2014-04-11 LAB — SAVE SMEAR

## 2014-04-11 LAB — RETICULOCYTES
RBC.: 2.35 MIL/uL — ABNORMAL LOW (ref 3.87–5.11)
Retic Count, Absolute: 16.5 10*3/uL — ABNORMAL LOW (ref 19.0–186.0)
Retic Ct Pct: 0.7 % (ref 0.4–3.1)

## 2014-04-11 LAB — ABO/RH: ABO/RH(D): B POS

## 2014-04-11 LAB — PREPARE RBC (CROSSMATCH)

## 2014-04-11 LAB — FERRITIN: Ferritin: 514 ng/mL — ABNORMAL HIGH (ref 10–291)

## 2014-04-11 LAB — PHOSPHORUS: PHOSPHORUS: 2.7 mg/dL (ref 2.3–4.6)

## 2014-04-11 MED ORDER — SODIUM CHLORIDE 0.9 % IV SOLN: Freq: Once | INTRAVENOUS | Status: DC

## 2014-04-11 MED ORDER — FERROUS SULFATE 300 (60 FE) MG/5ML PO SYRP
300.0000 mg | ORAL_SOLUTION | Freq: Three times a day (TID) | ORAL | Status: DC
  Administered 2014-04-11 – 2014-04-16 (×16): 300 mg via ORAL
  Filled 2014-04-11 (×19): qty 5

## 2014-04-11 MED ORDER — INSULIN GLARGINE 100 UNIT/ML ~~LOC~~ SOLN
17.0000 [IU] | Freq: Every day | SUBCUTANEOUS | Status: DC
  Administered 2014-04-11 – 2014-04-12 (×2): 17 [IU] via SUBCUTANEOUS
  Filled 2014-04-11 (×2): qty 0.17

## 2014-04-11 MED ORDER — INSULIN ASPART 100 UNIT/ML ~~LOC~~ SOLN
0.0000 [IU] | Freq: Three times a day (TID) | SUBCUTANEOUS | Status: DC
  Administered 2014-04-11: 5 [IU] via SUBCUTANEOUS
  Administered 2014-04-11 (×2): 3 [IU] via SUBCUTANEOUS
  Administered 2014-04-12: 5 [IU] via SUBCUTANEOUS
  Administered 2014-04-12: 3 [IU] via SUBCUTANEOUS
  Administered 2014-04-12: 9 [IU] via SUBCUTANEOUS

## 2014-04-11 MED ORDER — PANTOPRAZOLE SODIUM 40 MG PO TBEC
40.0000 mg | DELAYED_RELEASE_TABLET | Freq: Every day | ORAL | Status: DC
  Filled 2014-04-11: qty 1

## 2014-04-11 NOTE — Progress Notes (Signed)
Physical Therapy Treatment Patient Details Name: [name] MRN: [id number] DOB: 08/30/1990 Today's Date: 04/11/2014    History of Present Illness 68F admitted with very severe DKA and s/p prolonged cardiac arrest (reportedly > 20 mins CPR) due to hyperkalemia    PT Comments    Pt moves well, but is limited by Orthostatic BP.  Supine BP 94/58, sitting 78/53, standing 65/39 with c/o dizziness, and after returning to sitting 95/65.  Pt indicated dizziness only in standing.  RN made aware of Orthostasis.  Will continue to follow.    Follow Up Recommendations  Home health PT;Supervision/Assistance - 24 hour     Equipment Recommendations  Rolling walker with 5" wheels    Recommendations for Other Services OT consult     Precautions / Restrictions Precautions Precautions: Fall Restrictions Weight Bearing Restrictions: No    Mobility  Bed Mobility Overal bed mobility: Needs Assistance Bed Mobility: Supine to Sit     Supine to sit: Supervision     General bed mobility comments: pt needs increased time, but was able to complete without A.    Transfers Overall transfer level: Needs assistance Equipment used: Rolling walker (2 wheeled) Transfers: Sit to/from [facility] Sit to Stand: Min guard Stand pivot transfers: Min guard       General transfer comment: cues for UE use and movement through pivot.  pt Orthostatic limiting further mobility.    Ambulation/Gait                 Stairs            Wheelchair Mobility    Modified Rankin (Stroke Patients Only)       Balance Overall balance assessment: Needs assistance Sitting-balance support: No upper extremity supported;Feet supported Sitting balance-Leahy Scale: Good     Standing balance support: Single extremity supported Standing balance-Leahy Scale: Poor Standing balance comment: pt utilizes UE support 2/2 ankle pain and dizziness.                       Cognition Arousal/Alertness: Awake/alert Behavior During Therapy: Flat affect Overall Cognitive Status: Within Functional Limits for tasks assessed                      Exercises      General Comments        Pertinent Vitals/Pain Pain Assessment: 0-10 Pain Score: 4  Pain Location: R ankle Pain Descriptors / Indicators: Aching Pain Intervention(s): Repositioned;Premedicated before session    Home Living                      Prior Function            PT Goals (current goals can now be found in the care plan section) Acute Rehab PT Goals Patient Stated Goal: return to finish school PT Goal Formulation: With patient Time For Goal Achievement: 04/23/14 Potential to Achieve Goals: Good Progress towards PT goals: Progressing toward goals    Frequency  Min 3X/week    PT Plan Current plan remains appropriate    Co-evaluation             End of Session Equipment Utilized During Treatment: Gait belt Activity Tolerance: Treatment limited secondary to medical complications (Comment) (Orthostatic) Patient left: in chair;with call bell/phone within reach;with family/visitor present     Time: 1201-1224 PT Time Calculation (min): 23 min  Charges:  $Therapeutic Activity: 23-37 mins  G CodesCatarina Hartshorn, [place] 04/11/2014, 12:49 PM

## 2014-04-11 NOTE — Progress Notes (Signed)
Inpatient Diabetes Program Recommendations  AACE/ADA: New Consensus Statement on Inpatient Glycemic Control (2013)  Target Ranges:  Prepandial:   less than 140 mg/dL      Peak postprandial:   less than 180 mg/dL (1-2 hours)      Critically ill patients:  140 - 180 mg/dL   Reason for Assessment:  Results for [name], [name] (MRN ID:[id number]) as of 04/11/2014 16:30  Ref. Range 04/11/2014 07:54 04/11/2014 09:25 04/11/2014 12:35  Glucose-Capillary Latest Range: 70-99 mg/dL 191 (H) 263 (H) 225 (H)   Diabetes history: Type 1 diabetes  Due to history of Type 1 Diabetes, please consider adding Novolog meal coverage 3 units tid with meals.  Briefly spoke to patient.  Her affect remains flat. She states she has no needs at this time.  Thanks,  Adah Perl, RN, BC-ADM Inpatient Diabetes Coordinator Pager [phone]

## 2014-04-11 NOTE — Progress Notes (Signed)
PULMONARY / CRITICAL CARE MEDICINE   Name: [name] MRN: [id number] DOB: [birth year]    ADMISSION DATE:  04/07/2014   REFERRING MD :  EDP  CHIEF COMPLAINT:  Post arrest   INITIAL PRESENTATION:  53F admitted with very severe DKA and s/p prolonged cardiac arrest (reportedly > 20 mins CPR) due to hyperkalemia.  STUDIES:  TTE 10/19 >> normal LV fxn, normal PAP's  SIGNIFICANT EVENTS: 10/18 - admitted after cardiac arrest in setting hyperkalemia secondary to severe DKA 10/19 - extubated 10/20 - transferred out of ICU to regular floor. 10/21 - started back on insulin gtt due to [place] increasing 10/22 - AG improved 10/22 - Hb 6.3 10/22, 6.1 10/23, pt refusing blood transfusion  INDWELLING DEVICES:: ETT 10/18 >> 10/19 L [place] CVL 10/18 >>   MICRO DATA:   ANTIMICROBIALS:    SUBJECTIVE:  Pt sleepy, reports pain in left neck and chest wall, requesting pain medication.  Feels hot and sweaty.  Reports eating breakfast and a lot of oral fluids but mouth still feels dry.  RT reported near-syncope with orthostatic drop in SBP (to ~60) when placed on bedside commode, however pt reports able to use bedside commode with increased OOB strength, reports no lightheadedness, no SOB, or increased fatigue.  Pt has not had a BM, not N/V/D or abdominal pain.    VITAL SIGNS: Temp:  [98.6 F (37 C)-100.1 F (37.8 C)] 98.6 F (37 C) (10/23 0747) Pulse Rate:  [103-107] 103 (10/22 2343) Resp:  [14-20] 14 (10/22 2343) BP: (94-112)/(58-69) 112/69 mmHg (10/23 0747) SpO2:  [89 %-100 %] 89 % (10/22 2343) HEMODYNAMICS:   VENTILATOR SETTINGS:  n/a   INTAKE / OUTPUT:  Intake/Output Summary (Last 24 hours) at 04/12/14 1038 Last data filed at 04/12/14 0926  Gross per 24 hour  Intake   1429 ml  Output    600 ml  Net    829 ml    PHYSICAL EXAMINATION: General: Young female, WDWN, appears fatigued, but in NAD Neuro:  Sleepy but following commands, flat affect HEENT: NCAT, PERRL, EOMi, dry  oral mucosa, left neck ttp, no erythema, edema or crepitus Cardiovascular: Tachy but regular, no M/R/G - left anterior chest wall extremely ttp, no erythema, edema or discharge from line Lungs: CTA A&P no W/R/R,  Abdomen: +BS x4, soft, NT/ND. Ext: no edema, radial pulse RUE 2+, LUE 1+, LE 2+ and symmetric Skin: Warm, sweating, no pitting edema, no rashes. MSK:  Bilateral knees swollen, no erythema, non-tender, not warm  LABS:  CBC  Recent Labs Lab 04/09/14 0600 04/11/14 0400 04/12/14 0715  WBC 8.8 5.7 2.9*  HGB 7.2* 6.3* 6.1*  HCT 20.1* 18.5* 17.9*  PLT 136* 103* 126*   Coag's No results found for this basename: APTT, INR,  in the last 168 hours  BMET  Recent Labs Lab 04/11/14 0400 04/11/14 0813 04/11/14 1413  NA 145 142 141  K 3.5* 3.6* 3.9  CL 114* 110 109  CO2 22 20 22   BUN 14 13 13   CREATININE 1.24* 1.19* 1.20*  GLUCOSE 165* 296* 227*    Electrolytes  Recent Labs Lab 04/11/14 0400 04/11/14 0813 04/11/14 1413  CALCIUM 8.2* 8.1* 8.0*  MG 1.7  --   --   PHOS 2.7  --   --    Sepsis Markers  Recent Labs Lab 04/07/14 1433 04/07/14 2314  LATICACIDVEN 6.87* 4.1*   ABG  Recent Labs Lab 04/07/14 1442  PHART 7.006*  PCO2ART 36.6  PO2ART 567.0*  Liver Enzymes  Recent Labs Lab 04/07/14 1436  AST 337*  ALT 198*  ALKPHOS 113  BILITOT <0.2*  ALBUMIN 2.3*   Cardiac Enzymes  Recent Labs Lab 04/08/14 0400  TROPONINI 0.47*   Glucose  Recent Labs Lab 04/11/14 0754 04/11/14 0925 04/11/14 1235 04/11/14 1627 04/11/14 2203 04/12/14 0732  GLUCAP 191* 263* 225* 206* 261* 397*    Imaging No results found.  ASSESSMENT / PLAN:  ENDOCRINE A: Very severe DKA suspect due to poor DM management  - 10/23 > AG 10, closed x 2 days P:   Increase Lantus 22 U/day + SSI Increase basal insulin dose to try and maintain CBG <180   CARDIOVASCULAR A: Prolonged asystolic arrest - due to severe hyperkalemia secondary to DKA > resolved Transient  AF after ROSC > sinus rhythm > resolved Shock, suspect hypovolemic. TTE reassuring 10/19 > resolved Hypotension 10/21 - mild, resolved Mild tachycardia - likely due to anemia and/or hypovolemia P:  IVF KVO Con't to push PO fluid intake and monitor I/O's Monitor hemodynamics, get orthostatics with next OOB with assistance  RENAL Severe AG acidosis due to DKA, lactic acidosis AKI, nonoliguric, gradually improving with increasing GFR (48 on 10/20, 73 10/22, with decreasing sCr) Severe hyperkalemia, resolved Non-gap acidosis> most likely due to saline administration P:   IVF at above Monitor I/Os Trend BMP  PULMONARY A: Acute respiratory failure post cardiac arrest - resolved Chest Wall Pain - noted 10/23 with auscultation, dramatic response to assessment  P:   Continue pulmonary toiletry / pulmonary hygiene. PRN percocet for pain   GASTROINTESTINAL A: ? Bloody gastric aspirate, resolved GI prophylaxis no longer indicated Constipation P:   Pt tolerating diet No BM 10/22-10/23, with iron supplementation and increasing diet, add miralax and colace  HEMATOLOGIC A: Microcytic Anemia - H/H 6.1, no evidence of frank bleed, FOB still pending.   VTE prophylaxis Thromybocytopenia - improving Pancytopenia - ??  With all cell lines decreased  (WBC 2.9, H/H 6.1/17.9, Plt 126) and low retic's Ddx:  Bone marrow dysfunction, viral infection with low grade fever, vs other inflammatory process.  Blood smear shows decreased RBC and retic count, ferritin elevated before iron supplementation, unlikely to be iron-deficient P:  Con't SCDs Occult blood stool still needed Minimize phelotomy, may be contributory to decreasing H/H given low retic count and freq labs CBC in am   INFECTIOUS A: Fever - low grade temps 10/23, Tmax 100.1 P:   Follow fever curve PRN tylenol for fever/pain   NEUROLOGIC A: Anoxic encephalopathy - improving, with intermittently flat affect (may have psych  component) Labile Affect - ranges from manic to flat P:   Continue to monitor clinically Consider PSY eval   Rheumatology:  A:  Bilateral knee effusion> uncertain etiology, gout? Lupus?  P:  Serum ANA - neg    Family updated: father at bedside   TODAY'S SUMMARY:  Severe DKA, hyperkalemia, s/p arrest. Markedly improved 10/20 and transferred to floor.  However was sent back to SDU for insulin gtt on 10/22; now has closed gap.  Pt eating, off DKA protocol, with elevated CBG's in 200's, will increase basal insulin. Concern for anemia without frank bleed and does not appear to be iron-deficient per ferritin level.  Fecal occult blood still pending. Will try to minimize phlebotomy while monitoring.  PT continues to refuse PRBC transfusion for anemia, may need further workup for pancytopenia vs inflammatory process/infection?.  Query if she understands the consequences of refusing PRBC's.    PCCM will  transfer primary SVC to 10/23 as of 0700.    Delsa Grana PA-S2  Noe Gens, NP-C [facility] Pgr: [phone] or [phone]  Attending:  I have seen and examined the patient with nurse practitioner/resident and agree with the note above.   On exam> flat affect, lungs clear, mildly tachycardic She denies bleeding of any kind She is still refusing blood transfusion, stating that no information has been provided for her regarding risk of transfusion when the paperwork is in fact right in front of her.  From a metabolic standpoint she has recovered, eating OK  This anemia is bothersome.  Low reticulocyte count, overall most consistent with marrow suppression which clinically is most consistent with shock from the cardiac arrest.  My team (myself included both today and yesterday) have tried at length to educate her on the benefits of blood transfusion with her dangerously low Hgb level.  I also discussed with her mother yesterday and tried to call her mother today but had  to leave a message.  Will continue to provide education and convince her to take the transfusion.  Transfer to [place], MD [place] PCCM Pager: [phone] Cell: [phone] If no response, call [phone]    04/12/2014, 10:38 AM

## 2014-04-11 NOTE — Progress Notes (Signed)
Patients hemoglobin this am was 6.3. Notified elink Dr Nelda Marseille. Ordered 1 unit of PBRCs.  After speaking with patient to obtain informed consent, patient stated that she was not interested in receiving blood products at this time. Educated patient of importance of hemoglobin in blood and the benefits of blood. Educated patient on small risk of infection and reaction, and that benefits outweigh risks. Asked patient if she would like more information to read and she was not interested. Patient stated that her decision was not religious, but that she does not want blood. Patients mother at bedside. Notified Dr Nelda Marseille of patient's decision.

## 2014-04-11 NOTE — Progress Notes (Signed)
PULMONARY / CRITICAL CARE MEDICINE   Name: BECKLEY PRZYBYLSKI MRN: [id number] DOB: [birth year]    ADMISSION DATE:  04/07/2014   REFERRING MD :  EDP  CHIEF COMPLAINT:  Post arrest   INITIAL PRESENTATION:  51F admitted with very severe DKA and s/p prolonged cardiac arrest (reportedly > 20 mins CPR) due to hyperkalemia.  STUDIES:  TTE 10/19 >> normal LV fxn, normal PAP's  SIGNIFICANT EVENTS: 10/18 - admitted after cardiac arrest in setting hyperkalemia secondary to severe DKA 10/19 - extubated 10/20 - transferred out of ICU to regular floor. 10/21 - started back on insulin gtt due to AG increasing  INDWELLING DEVICES:: ETT 10/18 >>  L [place] CVL 10/18 >>   MICRO DATA:   ANTIMICROBIALS:    SUBJECTIVE:  Awake and alert this morning, hungry, reports no bleeding.  VITAL SIGNS: Temp:  [98.2 F (36.8 C)-100.5 F (38.1 C)] 99.2 F (37.3 C) (10/22 0755) Pulse Rate:  [97-117] 103 (10/22 0755) Resp:  [11-22] 14 (10/22 0755) BP: (76-115)/(34-77) 112/77 mmHg (10/22 0755) SpO2:  [94 %-100 %] 100 % (10/22 0755) Weight:  [49.2 kg (108 lb 7.5 oz)] 49.2 kg (108 lb 7.5 oz) (10/21 2000) HEMODYNAMICS:   VENTILATOR SETTINGS:   INTAKE / OUTPUT:  Intake/Output Summary (Last 24 hours) at 04/11/14 0926 Last data filed at 04/11/14 0509  Gross per 24 hour  Intake 963.33 ml  Output   1050 ml  Net -86.67 ml    PHYSICAL EXAMINATION: General: no distress, flat affect HEENT: NCAT, EOMi PULM: CTA B CV; RRR, no mgr AB: BS+, soft, nontender Ext: warm, no edema MSK: bilateral knee effusions, nontender, not warm Neuro: A&Ox4, maew  LABS:  CBC  Recent Labs Lab 04/08/14 0400 04/09/14 0600 04/11/14 0400  WBC 7.0 8.8 5.7  HGB 8.0* 7.2* 6.3*  HCT 22.8* 20.1* 18.5*  PLT 178 136* 103*   Coag's No results found for this basename: APTT, INR,  in the last 168 hours BMET  Recent Labs Lab 04/10/14 1913 04/10/14 2147 04/11/14 0400  NA 146 146 145  K 4.1 3.6* 3.5*  CL 112 114*  114*  CO2 20 22 22   BUN 17 15 14   CREATININE 1.38* 1.34* 1.24*  GLUCOSE 269* 144* 165*   Electrolytes  Recent Labs Lab 04/10/14 1913 04/10/14 2147 04/11/14 0400  CALCIUM 8.4 8.3* 8.2*  MG  --   --  1.7  PHOS  --   --  2.7   Sepsis Markers  Recent Labs Lab 04/07/14 1433 04/07/14 2314  LATICACIDVEN 6.87* 4.1*   ABG  Recent Labs Lab 04/07/14 1442  PHART 7.006*  PCO2ART 36.6  PO2ART 567.0*   Liver Enzymes  Recent Labs Lab 04/07/14 1436  AST 337*  ALT 198*  ALKPHOS 113  BILITOT <0.2*  ALBUMIN 2.3*   Cardiac Enzymes  Recent Labs Lab 04/08/14 0400  TROPONINI 0.47*   Glucose  Recent Labs Lab 04/11/14 0204 04/11/14 0306 04/11/14 0408 04/11/14 0508 04/11/14 0624 04/11/14 0754  GLUCAP 215* 196* 152* 137* 166* 191*    Imaging No results found.  ASSESSMENT / PLAN:  ENDOCRINE A: DM 1 Very severe DKA suspect due to poor DM management  >Gap close this AM P:   Lantus home dose (17 units daily) + AC sensitive scale SSI Carb modified diet  CARDIOVASCULAR Prolonged asystolic arrest -  due to severe hyperkalemia secondary to DKA > resolved Transient AF after ROSC > sinus rhythm with extrasystoles, resolved Shock, suspect hypovolemic. TTE reassuring 10/19 > resolved  Hypotension 10/21 - mild, resolved P:  Eat today KVO fluids  RENAL Severe AG acidosis due to DKA, lactic acidosis > resolved Non-gap acidosis> most likely due to saline administration AKI, nonoliguric  Severe hyperkalemia, resolved Hypercalcemia P:   BMET q6h KVO fluids If no improvement in serum bicarb then work up for other causes of non-gap acidosis  PULMONARY Acute respiratory failure post cardiac arrest - resolved P:   Continue pulmonary toiletry / pulmonary hygiene.  GASTROINTESTINAL A: ? Bloody gastric aspirate, resolved GI prophylaxis > no longer indicated P:   D/c protonix  HEMATOLOGIC A: Microcytic Anemia > no evidence of bleeding > 10/22 AM I explained  the minor risk of infection from a blood transfusion to the patient and her mother and explained the benefits, but she refused VTE prophylaxis P:  Check reticulocyte count, ferritin Check peripheral smear Occult blood stool Provide literature regarding blood transfusion risk/benefit SCDs  d/cheparin. CBC in AM. Iron supplementation Minimize phlebotomy  INFECTIOUS A: Fever > low grade, no clear sign of infection P:   Monitor clinically, no abx for now.  NEUROLOGIC A: Anoxic encephalopathy, improving though still has very flat affect on exam. P:   Monitor clinically   Rheumatology: A: Bilateral knee effusion> uncertain etiology, gout? Lupus? P: Start with serum ANA, consider joint aspiration   Family updated: spoke with mother at bedside 10/22   TODAY'S SUMMARY: Severe DKA, hyperkalemia, s/p arrest. Markedly improved 10/20 and transferred to floor.  However was sent back to SDU for insulin gtt on 10/22; now has closed gap, not eating.  Start back lantus + ssi and let her eat.  Anemia without bleeding worrisome, she denies heavy menstruation.  Work up for anemia, will continue to try to convince her to take a blood transfusion.   Roselie Awkward, MD [facility] Pager: [phone] Cell: [phone] If no response, call [phone]

## 2014-04-11 NOTE — Plan of Care (Signed)
Problem: Phase I Progression Outcomes Goal: Acidosis resolving Outcome: Progressing Anion gap improving this am. Insulin drip still infusing at this time.

## 2014-04-12 ENCOUNTER — Inpatient Hospital Stay (HOSPITAL_COMMUNITY): Payer: Medicaid Other

## 2014-04-12 LAB — GLUCOSE, CAPILLARY
GLUCOSE-CAPILLARY: 397 mg/dL — AB (ref 70–99)
GLUCOSE-CAPILLARY: 300 mg/dL — AB (ref 70–99)
Glucose-Capillary: 235 mg/dL — ABNORMAL HIGH (ref 70–99)
Glucose-Capillary: 352 mg/dL — ABNORMAL HIGH (ref 70–99)

## 2014-04-12 LAB — CBC
HCT: 17.9 % — ABNORMAL LOW (ref 36.0–46.0)
Hemoglobin: 6.1 g/dL — CL (ref 12.0–15.0)
MCH: 26.9 pg (ref 26.0–34.0)
MCHC: 34.1 g/dL (ref 30.0–36.0)
MCV: 78.9 fL (ref 78.0–100.0)
PLATELETS: 126 10*3/uL — AB (ref 150–400)
RBC: 2.27 MIL/uL — ABNORMAL LOW (ref 3.87–5.11)
RDW: 15 % (ref 11.5–15.5)
WBC: 2.9 10*3/uL — AB (ref 4.0–10.5)

## 2014-04-12 LAB — BASIC METABOLIC PANEL
Anion gap: 11 (ref 5–15)
BUN: 13 mg/dL (ref 6–23)
CALCIUM: 8.3 mg/dL — AB (ref 8.4–10.5)
CO2: 22 mEq/L (ref 19–32)
Chloride: 108 mEq/L (ref 96–112)
Creatinine, Ser: 1.12 mg/dL — ABNORMAL HIGH (ref 0.50–1.10)
GFR calc Af Amer: 80 mL/min — ABNORMAL LOW (ref >90)
GFR, EST NON AFRICAN AMERICAN: 69 mL/min — AB (ref >90)
GLUCOSE: 243 mg/dL — AB (ref 70–99)
Potassium: 4.2 mEq/L (ref 3.7–5.3)
SODIUM: 141 meq/L (ref 137–147)

## 2014-04-12 LAB — ANA: ANA: NEGATIVE

## 2014-04-12 MED ORDER — POLYETHYLENE GLYCOL 3350 17 G PO PACK
17.0000 g | PACK | Freq: Every day | ORAL | Status: DC
  Administered 2014-04-12 – 2014-04-16 (×5): 17 g via ORAL
  Filled 2014-04-12 (×6): qty 1

## 2014-04-12 MED ORDER — DOCUSATE SODIUM 100 MG PO CAPS
100.0000 mg | ORAL_CAPSULE | Freq: Two times a day (BID) | ORAL | Status: AC
  Administered 2014-04-12 – 2014-04-14 (×6): 100 mg via ORAL
  Filled 2014-04-12 (×7): qty 1

## 2014-04-12 MED ORDER — INSULIN GLARGINE 100 UNIT/ML ~~LOC~~ SOLN
22.0000 [IU] | Freq: Every day | SUBCUTANEOUS | Status: DC
  Administered 2014-04-13: 22 [IU] via SUBCUTANEOUS
  Filled 2014-04-12: qty 0.22

## 2014-04-12 MED ORDER — INSULIN ASPART 100 UNIT/ML ~~LOC~~ SOLN
0.0000 [IU] | Freq: Three times a day (TID) | SUBCUTANEOUS | Status: DC
  Administered 2014-04-14: 5 [IU] via SUBCUTANEOUS
  Administered 2014-04-14: 1 [IU] via SUBCUTANEOUS
  Administered 2014-04-14: 2 [IU] via SUBCUTANEOUS
  Administered 2014-04-15: 3 [IU] via SUBCUTANEOUS
  Administered 2014-04-15: 5 [IU] via SUBCUTANEOUS

## 2014-04-12 MED ORDER — INSULIN ASPART 100 UNIT/ML ~~LOC~~ SOLN
0.0000 [IU] | Freq: Every day | SUBCUTANEOUS | Status: DC
  Administered 2014-04-12: 5 [IU] via SUBCUTANEOUS
  Administered 2014-04-13: 4 [IU] via SUBCUTANEOUS

## 2014-04-12 NOTE — Progress Notes (Signed)
Inpatient Diabetes Program Recommendations  AACE/ADA: New Consensus Statement on Inpatient Glycemic Control (2013)  Target Ranges:  Prepandial:   less than 140 mg/dL      Peak postprandial:   less than 180 mg/dL (1-2 hours)      Critically ill patients:  140 - 180 mg/dL   Reason for Assessment:  Type 1 diabetes Results for [name], [name] (MRN ID:[id number]) as of 04/12/2014 13:59  Ref. Range 04/11/2014 09:25 04/11/2014 12:35 04/11/2014 16:27 04/11/2014 22:03 04/12/2014 07:32  Glucose-Capillary Latest Range: 70-99 mg/dL 263 (H) 225 (H) 206 (H) 261 (H) 397 (H)    Diabetes history: Type 1 diabetes Outpatient Diabetes medications:  Lantus 17 units q HS, Novolog 4 units tid with meals Current orders for Inpatient glycemic control:  Lantus 17 units daily, Novolog sensitive tid with meals  CBG's remain elevated.  Talked to patient regarding home diabetes regimen.  She was sitting up in bed but still seemed very flat.  I asked her about her home regimen and doses of insulin and she said "I don't know".  She said she prefers 70/30 to Lantus/Novolog.  I asked if it was because the 70/30 regimen was less shots, and she said "No, its the same amount of shots".  I explained that the Lantus/Novolog regimen requires at least 4 shots daily and 70/30 is only 2 shots.  At this point, I recommend continuation of Lantus/Novolog.  Consider Lantus 22 units daily and add Novolog meal coverage 3 units tid with meals.  Also please add HS coverage/correction.  Once patient is stable for discharge, could switch to 70/30 regimen however cost is a barrier.  Mom states that she has Lantus pens/Novolog pens at home.  I am still concerned about patients ability to care for her self and her flat affect.  Thanks, Adah Perl, RN, BC-ADM Inpatient Diabetes Coordinator Pager [phone]

## 2014-04-13 ENCOUNTER — Inpatient Hospital Stay (HOSPITAL_COMMUNITY): Payer: Medicaid Other

## 2014-04-13 DIAGNOSIS — M6289 Other specified disorders of muscle: Secondary | ICD-10-CM

## 2014-04-13 DIAGNOSIS — J9601 Acute respiratory failure with hypoxia: Secondary | ICD-10-CM

## 2014-04-13 DIAGNOSIS — E1011 Type 1 diabetes mellitus with ketoacidosis with coma: Principal | ICD-10-CM

## 2014-04-13 DIAGNOSIS — E1010 Type 1 diabetes mellitus with ketoacidosis without coma: Secondary | ICD-10-CM

## 2014-04-13 DIAGNOSIS — I469 Cardiac arrest, cause unspecified: Secondary | ICD-10-CM

## 2014-04-13 LAB — CBC
HCT: 22.2 % — ABNORMAL LOW (ref 36.0–46.0)
Hemoglobin: 7.6 g/dL — ABNORMAL LOW (ref 12.0–15.0)
MCH: 26.5 pg (ref 26.0–34.0)
MCHC: 34.2 g/dL (ref 30.0–36.0)
MCV: 77.4 fL — AB (ref 78.0–100.0)
Platelets: ADEQUATE 10*3/uL (ref 150–400)
RBC: 2.87 MIL/uL — ABNORMAL LOW (ref 3.87–5.11)
RDW: 14.6 % (ref 11.5–15.5)
WBC: 3.6 10*3/uL — ABNORMAL LOW (ref 4.0–10.5)

## 2014-04-13 LAB — BASIC METABOLIC PANEL
ANION GAP: 10 (ref 5–15)
BUN: 13 mg/dL (ref 6–23)
CHLORIDE: 108 meq/L (ref 96–112)
CO2: 24 mEq/L (ref 19–32)
Calcium: 8.6 mg/dL (ref 8.4–10.5)
Creatinine, Ser: 1.22 mg/dL — ABNORMAL HIGH (ref 0.50–1.10)
GFR calc Af Amer: 72 mL/min — ABNORMAL LOW (ref >90)
GFR, EST NON AFRICAN AMERICAN: 62 mL/min — AB (ref >90)
Glucose, Bld: 208 mg/dL — ABNORMAL HIGH (ref 70–99)
POTASSIUM: 3.8 meq/L (ref 3.7–5.3)
Sodium: 142 mEq/L (ref 137–147)

## 2014-04-13 LAB — GLUCOSE, CAPILLARY
GLUCOSE-CAPILLARY: 411 mg/dL — AB (ref 70–99)
GLUCOSE-CAPILLARY: 330 mg/dL — AB (ref 70–99)
GLUCOSE-CAPILLARY: 67 mg/dL — AB (ref 70–99)
Glucose-Capillary: 45 mg/dL — ABNORMAL LOW (ref 70–99)
Glucose-Capillary: 116 mg/dL — ABNORMAL HIGH (ref 70–99)
Glucose-Capillary: 61 mg/dL — ABNORMAL LOW (ref 70–99)
Glucose-Capillary: 95 mg/dL (ref 70–99)
Glucose-Capillary: 317 mg/dL — ABNORMAL HIGH (ref 70–99)

## 2014-04-13 LAB — TYPE AND SCREEN
ABO/RH(D): B POS
Antibody Screen: NEGATIVE
Unit division: 0

## 2014-04-13 LAB — GLUCOSE, RANDOM: GLUCOSE: 450 mg/dL — AB (ref 70–99)

## 2014-04-13 MED ORDER — INSULIN ASPART 100 UNIT/ML ~~LOC~~ SOLN: 3.0000 [IU] | Freq: Three times a day (TID) | SUBCUTANEOUS | Status: DC

## 2014-04-13 MED ORDER — INSULIN ASPART 100 UNIT/ML ~~LOC~~ SOLN
15.0000 [IU] | Freq: Once | SUBCUTANEOUS | Status: AC
  Administered 2014-04-13: 15 [IU] via SUBCUTANEOUS

## 2014-04-13 MED ORDER — DEXTROSE 50 % IV SOLN
INTRAVENOUS | Status: AC
  Administered 2014-04-13: 25 mL
  Filled 2014-04-13: qty 50

## 2014-04-13 MED ORDER — INSULIN ASPART 100 UNIT/ML ~~LOC~~ SOLN
20.0000 [IU] | Freq: Once | SUBCUTANEOUS | Status: AC
  Administered 2014-04-13: 20 [IU] via SUBCUTANEOUS

## 2014-04-13 MED ORDER — INSULIN GLARGINE 100 UNIT/ML ~~LOC~~ SOLN
18.0000 [IU] | Freq: Every day | SUBCUTANEOUS | Status: DC
  Filled 2014-04-13: qty 0.18

## 2014-04-13 MED ORDER — DEXTROSE 50 % IV SOLN
25.0000 mL | Freq: Once | INTRAVENOUS | Status: AC | PRN
Start: 2014-04-13 — End: 2014-04-13

## 2014-04-13 MED ORDER — PHENOL 1.4 % MT LIQD
1.0000 | OROMUCOSAL | Status: DC | PRN
  Administered 2014-04-13: 1 via OROMUCOSAL
  Filled 2014-04-13: qty 177

## 2014-04-13 NOTE — Progress Notes (Addendum)
[facility] PROGRESS NOTE  POETRY SIS [id number] DOB: Jul 21, 1990 DOA: 04/07/2014 PCP: Lorayne Marek, MD  Interim summary Patient is a 23 year old female with a past medical history of type 1 diabetes mellitus, history of diabetic ketoacidosis, patient feeling poorly final 6 days prior to arrival, was found by her roommate on the floor and unresponsive. EMS found patient to be pulseless with initial rhythm of asystole. He was intubated and resuscitated for greater than 20 minutes with restoration of spontaneous circulation. In the emergency department she was found to have a blood sugar of 2000, potassium of 8.0, in diabetic ketoacidosis and was admitted to the pulmonary critical care service. She was administered IV insulin, bicarbonate drip he continued on full ventilator support. She was extubated on 04/08/2014. On 04/10/2014 her anion gap trended up to 16 for which she was placed back on IV insulin. Patient is gap closed once again as she was transitioned to Lantus. He was seen and evaluated by the diabetes coordinator. Other issues addressed in this hospitalization include microcytic anemia having hemoglobin of 6.1 on 04/12/2014. She did not have evidence of GI bleed, and was transfused with packed red blood cells.  on 04/13/2014 her hemoglobin improved to 7.6. Patient having right-sided weakness, as she reports having difficulties writing. Patient having a    Assessment/Plan: 1. Cardiac arrest -Patient found unresponsive in her room by a roommates. EMS finding her to be in asystole, undergoing cardiopulmonary resuscitation for greater than 20 minutes -Likely secondary to severe diabetic ketoacidosis and hypokalemia. Labs showed a potassium of 8.0. -Patient extubated on 04/08/2014 -Currently hemodynamically stable  2.  Diabetic ketoacidosis -Patient with history of type 1 diabetes mellitus having a history of DKA -Resolved with closing of anion gap to 11 on  04/12/2014 -Patient is hyperglycemic having blood sugars in the 400 range, was given correction insulin, with repeat blood sugar of 330 -Had been on D5 half-normal which was discontinued -Continue Accu-Cheks, will add short-acting meal time coverage -Continue Lantus at 18 units subcutaneous daily  3.  Microcytic anemia. -Patient's hemoglobin trended down to 6.1 on 04/12/2014. -Has not have evidence of a GI bleed, awaiting stool for Hemoccult -She was transfused with packed red blood cells on 04/12/2014 with hemoglobin trended up to 7.6 on a.m. Lab  4.  Right-sided weakness -Likely a consequence of cardiac arrest/anoxia -Case was discussed with Dr.Camillo of neurology who recommended MRI of brain, which has been ordered -Physical therapy/occupational therapy consultation  5.  Hyperkalemia -Resolved. Secondary to diabetic  Code Status: Full Code Family Communication: Spoke with her mother Disposition Plan: Will provide correction insulin, if remains stable plan to transfer her to Wing today   Consultants:  PCCM   HPI/Subjective: Patient is awake and alert, following commands. She states feeling fine, tolerating by mouth intake, underwent blood transfusion yesterday  Objective: Filed Vitals:   04/13/14 0833  BP: 103/61  Pulse: 109  Temp:   Resp: 16    Intake/Output Summary (Last 24 hours) at 04/13/14 1154 Last data filed at 04/13/14 0620  Gross per 24 hour  Intake   1995 ml  Output   2475 ml  Net   -480 ml   Filed Weights   04/09/14 0600 04/09/14 2240 04/10/14 2000  Weight: 52.1 kg (114 lb 13.8 oz) 52.1 kg (114 lb 13.8 oz) 49.2 kg (108 lb 7.5 oz)    Exam:   General:  Having flat affect otherwise in no acute distress  Cardiovascular: Regular rate and rhythm normal S1-S2  Respiratory: Clear to auscultation bilaterally no wheezing rhonchi overall  Abdomen: Soft nontender nondistended  Musculoskeletal: No extremity edema  Neurological: Flat affect, she  has 4-5 muscle strength to right upper extremity and 45 muscle strength to right lower extremity, 5 of 5 muscle strength to left upper left lower extremity, sensation is intact  Data Reviewed: Basic Metabolic Panel:  Recent Labs Lab 04/10/14 2147 04/11/14 0400 04/11/14 0813 04/11/14 1413 04/12/14 1518 04/13/14 0900  NA 146 145 142 141 141  --   K 3.6* 3.5* 3.6* 3.9 4.2  --   CL 114* 114* 110 109 108  --   CO2 22 22 20 22 22   --   GLUCOSE 144* 165* 296* 227* 243* 450*  BUN 15 14 13 13 13   --   CREATININE 1.34* 1.24* 1.19* 1.20* 1.12*  --   CALCIUM 8.3* 8.2* 8.1* 8.0* 8.3*  --   MG  --  1.7  --   --   --   --   PHOS  --  2.7  --   --   --   --    Liver Function Tests:  Recent Labs Lab 04/07/14 1436  AST 337*  ALT 198*  ALKPHOS 113  BILITOT <0.2*  PROT 5.7*  ALBUMIN 2.3*   No results found for this basename: LIPASE, AMYLASE,  in the last 168 hours No results found for this basename: AMMONIA,  in the last 168 hours CBC:  Recent Labs Lab 04/07/14 1436  04/08/14 0400 04/09/14 0600 04/11/14 0400 04/12/14 0715 04/13/14 0620  WBC 11.7*  < > 7.0 8.8 5.7 2.9* 3.6*  NEUTROABS 7.1  --   --   --   --   --   --   HGB 10.2*  < > 8.0* 7.2* 6.3* 6.1* 7.6*  HCT 38.7  < > 22.8* 20.1* 18.5* 17.9* 22.2*  MCV 100.3*  < > 72.8* 74.7* 75.8* 78.9 77.4*  PLT 235  < > 178 136* 103* 126* PLATELET CLUMPS NOTED ON SMEAR, COUNT APPEARS ADEQUATE  < > = values in this interval not displayed. Cardiac Enzymes:  Recent Labs Lab 04/08/14 0400  TROPONINI 0.47*   BNP (last 3 results) No results found for this basename: PROBNP,  in the last 8760 hours CBG:  Recent Labs Lab 04/12/14 1227 04/12/14 1618 04/12/14 2133 04/13/14 0832 04/13/14 1052  GLUCAP 300* 235* 352* 411* 330*    Recent Results (from the past 240 hour(s))  MRSA PCR SCREENING     Status: None   Collection Time    04/07/14  6:32 PM      Result Value Ref Range Status   MRSA by PCR NEGATIVE  NEGATIVE Final    Comment:            The GeneXpert MRSA Assay (FDA     approved for NASAL specimens     only), is one component of a     comprehensive MRSA colonization     surveillance program. It is not     intended to diagnose MRSA     infection nor to guide or     monitor treatment for     MRSA infections.     Studies: Dg Chest Port 1 View  04/12/2014   CLINICAL DATA:  Recent onset of chest wall pain and cough ; history of diabetes and chronic renal failure  EXAM: PORTABLE CHEST - 1 VIEW  COMPARISON:  Portable chest x-ray of April 08, 2014  FINDINGS: The lungs  are mildly hypoinflated. There is mildly increased density at the left lung base with blunting of the costophrenic angle. The heart and pulmonary vascularity are within the limits of normal. The mediastinum is normal in width. The left subclavian catheter tip projects over the proximal SVC. The bony thorax is unremarkable.  IMPRESSION: Increased density at the left lung base is consistent with atelectasis and small left pleural effusion. When the patient can tolerate the procedure, a PA and lateral chest x-ray would be of value.   Electronically Signed   By: David  [place]   On: 04/12/2014 14:55    Scheduled Meds: . sodium chloride   Intravenous Once  . docusate sodium  100 mg Oral BID  . ferrous sulfate  300 mg Oral TID WC  . insulin aspart  0-5 Units Subcutaneous QHS  . insulin aspart  0-9 Units Subcutaneous TID WC  . insulin aspart  15 Units Subcutaneous Once  . insulin glargine  22 Units Subcutaneous Daily  . polyethylene glycol  17 g Oral Daily   Continuous Infusions:   Active Problems:   DKA (diabetic ketoacidoses)    Time spent: 35 min    Kelvin Cellar  [facility] Pager [phone]. If 7PM-7AM, please contact night-coverage at [email or web address], password [facility] 04/13/2014, 11:54 AM  LOS: 6 days

## 2014-04-13 NOTE — Progress Notes (Signed)
Hypoglycemic Event  CBG: 45  Treatment: 15 GM carbohydrate snack  Symptoms: Shaky  Follow-up CBG: Time:1445 CBG Result: 61  15 gm carb snack  F/U: 1500 CBG 67  25g dextrose  F/U: 1530 CBG 116  Possible Reasons for Event: Unknown  Comments/MD notified:     Lavina Resor Hope  Remember to initiate Hypoglycemia Order Set & complete

## 2014-04-14 ENCOUNTER — Inpatient Hospital Stay (HOSPITAL_COMMUNITY): Payer: Medicaid Other

## 2014-04-14 DIAGNOSIS — I469 Cardiac arrest, cause unspecified: Secondary | ICD-10-CM | POA: Diagnosis present

## 2014-04-14 DIAGNOSIS — D6489 Other specified anemias: Secondary | ICD-10-CM

## 2014-04-14 DIAGNOSIS — I639 Cerebral infarction, unspecified: Secondary | ICD-10-CM

## 2014-04-14 DIAGNOSIS — R7989 Other specified abnormal findings of blood chemistry: Secondary | ICD-10-CM

## 2014-04-14 LAB — IRON AND TIBC
Iron: 20 ug/dL — ABNORMAL LOW (ref 42–135)
SATURATION RATIOS: 11 % — AB (ref 20–55)
TIBC: 175 ug/dL — AB (ref 250–470)
UIBC: 155 ug/dL (ref 125–400)

## 2014-04-14 LAB — GLUCOSE, CAPILLARY
GLUCOSE-CAPILLARY: 133 mg/dL — AB (ref 70–99)
GLUCOSE-CAPILLARY: 94 mg/dL (ref 70–99)
Glucose-Capillary: 254 mg/dL — ABNORMAL HIGH (ref 70–99)
Glucose-Capillary: 170 mg/dL — ABNORMAL HIGH (ref 70–99)

## 2014-04-14 LAB — CBC
HCT: 21 % — ABNORMAL LOW (ref 36.0–46.0)
HEMOGLOBIN: 7.2 g/dL — AB (ref 12.0–15.0)
MCH: 26.8 pg (ref 26.0–34.0)
MCHC: 34.3 g/dL (ref 30.0–36.0)
MCV: 78.1 fL (ref 78.0–100.0)
PLATELETS: 300 10*3/uL (ref 150–400)
RBC: 2.69 MIL/uL — ABNORMAL LOW (ref 3.87–5.11)
RDW: 14.7 % (ref 11.5–15.5)
WBC: 5.2 10*3/uL (ref 4.0–10.5)

## 2014-04-14 LAB — BASIC METABOLIC PANEL
Anion gap: 10 (ref 5–15)
BUN: 12 mg/dL (ref 6–23)
CO2: 24 meq/L (ref 19–32)
Calcium: 8.6 mg/dL (ref 8.4–10.5)
Chloride: 105 mEq/L (ref 96–112)
Creatinine, Ser: 1.03 mg/dL (ref 0.50–1.10)
GFR calc Af Amer: 88 mL/min — ABNORMAL LOW (ref >90)
GFR, EST NON AFRICAN AMERICAN: 76 mL/min — AB (ref >90)
GLUCOSE: 262 mg/dL — AB (ref 70–99)
Potassium: 4.1 mEq/L (ref 3.7–5.3)
Sodium: 139 mEq/L (ref 137–147)

## 2014-04-14 LAB — FERRITIN: Ferritin: 359 ng/mL — ABNORMAL HIGH (ref 10–291)

## 2014-04-14 LAB — RETICULOCYTES
RBC.: 2.48 MIL/uL — AB (ref 3.87–5.11)
RETIC CT PCT: 1 % (ref 0.4–3.1)
Retic Count, Absolute: 24.8 10*3/uL (ref 19.0–186.0)

## 2014-04-14 LAB — HEMOGLOBIN AND HEMATOCRIT, BLOOD
HEMATOCRIT: 19.9 % — AB (ref 36.0–46.0)
HEMOGLOBIN: 6.8 g/dL — AB (ref 12.0–15.0)

## 2014-04-14 LAB — PREPARE RBC (CROSSMATCH)

## 2014-04-14 LAB — VITAMIN B12: Vitamin B-12: 775 pg/mL (ref 211–911)

## 2014-04-14 LAB — FOLATE: FOLATE: 10.8 ng/mL

## 2014-04-14 MED ORDER — INSULIN GLARGINE 100 UNIT/ML ~~LOC~~ SOLN
25.0000 [IU] | Freq: Every day | SUBCUTANEOUS | Status: DC
  Administered 2014-04-14 – 2014-04-15 (×2): 25 [IU] via SUBCUTANEOUS
  Filled 2014-04-14 (×2): qty 0.25

## 2014-04-14 MED ORDER — FUROSEMIDE 10 MG/ML IJ SOLN
20.0000 mg | Freq: Once | INTRAMUSCULAR | Status: AC
  Administered 2014-04-14: 20 mg via INTRAVENOUS
  Filled 2014-04-14: qty 2

## 2014-04-14 MED ORDER — DEXTROMETHORPHAN POLISTIREX 30 MG/5ML PO LQCR
15.0000 mg | Freq: Once | ORAL | Status: AC
  Administered 2014-04-14: 15 mg via ORAL
  Filled 2014-04-14: qty 5

## 2014-04-14 MED ORDER — INSULIN ASPART 100 UNIT/ML ~~LOC~~ SOLN
5.0000 [IU] | Freq: Three times a day (TID) | SUBCUTANEOUS | Status: DC
  Administered 2014-04-14 – 2014-04-15 (×3): 5 [IU] via SUBCUTANEOUS

## 2014-04-14 MED ORDER — DIPHENHYDRAMINE HCL 50 MG/ML IJ SOLN: 25.0000 mg | Freq: Four times a day (QID) | INTRAMUSCULAR | Status: DC | PRN

## 2014-04-14 MED ORDER — HEPARIN SODIUM (PORCINE) 5000 UNIT/ML IJ SOLN
5000.0000 [IU] | Freq: Three times a day (TID) | INTRAMUSCULAR | Status: DC
  Administered 2014-04-14 – 2014-04-16 (×6): 5000 [IU] via SUBCUTANEOUS
  Filled 2014-04-14 (×8): qty 1

## 2014-04-14 MED ORDER — DEXTROMETHORPHAN POLISTIREX 30 MG/5ML PO LQCR
15.0000 mg | Freq: Three times a day (TID) | ORAL | Status: DC | PRN
  Administered 2014-04-14 – 2014-04-15 (×2): 15 mg via ORAL
  Filled 2014-04-14 (×2): qty 5

## 2014-04-14 MED ORDER — SODIUM CHLORIDE 0.9 % IV SOLN
Freq: Once | INTRAVENOUS | Status: AC
  Administered 2014-04-14: 16:00:00 via INTRAVENOUS

## 2014-04-14 NOTE — Consult Note (Signed)
Referring Physician: Dr. Candiss Norse    Chief Complaint: stroke on MRI, s/p cardiac arrest  HPI:                                                                                                                                         [name] is an 23 y.o. female with a past medical history significant for type 1 diabetes mellitus, noncompliant with medications who was admitted by critical care one week ago after hyperkalemia induced cardiac arrest requiring CPR and intubation. Patient was also found to be on DKA at the time of admission. As past of work up, she had a brain MRI that disclosed a punctate area of acute nonhemorrhagic infarct involving the right coronal radiata and neurology was asked to evaluate the patient. She said that she is not aware of having stroke like symptoms before presenting to the hospital. Presently, she denies HA, vertigo, double vision, difficulty swallowing, focal weakness, slurred speech , confusion, disorientation, language or vision impairment. Some numbness in her feet. Date last known well: uncertain Time last known well: uncertain tPA Given: no, patient asymptomatic from a neuro standpoint.    Past Medical History  Diagnosis Date  . Diabetes mellitus type 1     2005 dx [facility]    Past Surgical History  Procedure Laterality Date  . Cataract extraction      6 years ago    Family History  Problem Relation Age of Onset  . Diabetes Maternal Grandmother   . Stroke Maternal Grandmother   . Cancer      aunt, passed last month, ?[facility]   Social History:  reports that she has never smoked. She does not have any smokeless tobacco history on file. She reports that she drinks alcohol. She reports that she does not use illicit drugs.  Allergies: No Known Allergies  Medications:                                                                                                                           Scheduled: . sodium chloride   Intravenous Once   . docusate sodium  100 mg Oral BID  . ferrous sulfate  300 mg Oral TID WC  . furosemide  20 mg Intravenous Once  . heparin subcutaneous  5,000 Units Subcutaneous 3 times per day  . insulin aspart  0-5 Units  Subcutaneous QHS  . insulin aspart  0-9 Units Subcutaneous TID WC  . insulin aspart  5 Units Subcutaneous TID WC  . insulin glargine  25 Units Subcutaneous Daily  . polyethylene glycol  17 g Oral Daily    ROS:                                                                                                                                       History obtained from chart review and patient.  General ROS: negative for - chills, fatigue, fever, night sweats, weight gain or weight loss Psychological ROS: negative for - behavioral disorder, hallucinations, memory difficulties, mood swings or suicidal ideation Ophthalmic ROS: negative for - blurry vision, double vision, eye pain or loss of vision ENT ROS: negative for - epistaxis, nasal discharge, oral lesions, sore throat, tinnitus or vertigo Allergy and Immunology ROS: negative for - hives or itchy/watery eyes Hematological and Lymphatic ROS: negative for - bleeding problems, bruising or swollen lymph nodes Endocrine ROS: negative for - galactorrhea, hair pattern changes, polydipsia/polyuria or temperature intolerance Respiratory ROS: negative for - cough, hemoptysis, shortness of breath or wheezing Cardiovascular ROS: negative for - chest pain, dyspnea on exertion, edema or irregular heartbeat Gastrointestinal ROS: negative for - abdominal pain, diarrhea, hematemesis, nausea/vomiting or stool incontinence Genito-Urinary ROS: negative for - dysuria, hematuria, incontinence or urinary frequency/urgency Musculoskeletal ROS: negative for - joint swelling or muscular weakness Neurological ROS: as noted in HPI Dermatological ROS: negative for rash and skin lesion changes  Physical exam: pleasant female in no apparent distress. Blood pressure  109/62, pulse 107, temperature 98.7 F (37.1 C), temperature source Oral, resp. rate 18, height _0  (1.549 m), weight 49.2 kg (108 lb 7.5 oz), last menstrual period 03/10/2014, SpO2 99.00%. Head: normocephalic. Neck: supple, no bruits, no JVD. Cardiac: no murmurs. Lungs: clear. Abdomen: soft, no tender, no mass. Extremities: no edema. Neurologic Examination:                                                                                                      General: Mental Status: Alert, oriented, thought content appropriate.  Speech fluent without evidence of aphasia.  Able to follow 3 step commands without difficulty. Cranial Nerves: II: Discs flat bilaterally; Visual fields grossly normal, pupils equal, round, reactive to light and accommodation III,IV, VI: ptosis not present, extra-ocular motions intact bilaterally V,VII: smile symmetric, facial light touch sensation normal bilaterally VIII: hearing normal bilaterally IX,X: gag reflex present XI: bilateral shoulder shrug  XII: midline tongue extension without atrophy or fasciculations  Motor: Right : Upper extremity   5/5    Left:     Upper extremity   5/5  Lower extremity   5/5     Lower extremity   5/5 Tone and bulk:normal tone throughout; no atrophy noted Sensory: Pinprick and light touch intact throughout, bilaterally Deep Tendon Reflexes:  Right: Upper Extremity   Left: Upper extremity   biceps (C-5 to C-6) 2/4   biceps (C-5 to C-6) 2/4 tricep (C7) 2/4    triceps (C7) 2/4 Brachioradialis (C6) 2/4  Brachioradialis (C6) 2/4  Lower Extremity Lower Extremity  quadriceps (L-2 to L-4) 2/4   quadriceps (L-2 to L-4) 2/4 Achilles (S1) 2/4   Achilles (S1) 2/4  Plantars: Right: downgoing   Left: downgoing Cerebellar: normal finger-to-nose,  normal heel-to-shin test Gait: No tested for safety reasons.    Results for orders placed during the hospital encounter of 04/07/14 (from the past 48 hour(s))  BASIC METABOLIC PANEL      Status: Abnormal   Collection Time    04/12/14  3:18 PM      Result Value Ref Range   Sodium 141  137 - 147 mEq/L   Potassium 4.2  3.7 - 5.3 mEq/L   Chloride 108  96 - 112 mEq/L   CO2 22  19 - 32 mEq/L   Glucose, Bld 243 (*) 70 - 99 mg/dL   BUN 13  6 - 23 mg/dL   Creatinine, Ser 1.12 (*) 0.50 - 1.10 mg/dL   Calcium 8.3 (*) 8.4 - 10.5 mg/dL   GFR calc non Af Amer 69 (*) >90 mL/min   GFR calc Af Amer 80 (*) >90 mL/min   Comment: (NOTE)     The eGFR has been calculated using the CKD EPI equation.     This calculation has not been validated in all clinical situations.     eGFR's persistently <90 mL/min signify possible Chronic Kidney     Disease.   Anion gap 11  5 - 15  GLUCOSE, CAPILLARY     Status: Abnormal   Collection Time    04/12/14  4:18 PM      Result Value Ref Range   Glucose-Capillary 235 (*) 70 - 99 mg/dL  GLUCOSE, CAPILLARY     Status: Abnormal   Collection Time    04/12/14  9:33 PM      Result Value Ref Range   Glucose-Capillary 352 (*) 70 - 99 mg/dL  CBC     Status: Abnormal   Collection Time    04/13/14  6:20 AM      Result Value Ref Range   WBC 3.6 (*) 4.0 - 10.5 K/uL   RBC 2.87 (*) 3.87 - 5.11 MIL/uL   Hemoglobin 7.6 (*) 12.0 - 15.0 g/dL   Comment: POST TRANSFUSION SPECIMEN   HCT 22.2 (*) 36.0 - 46.0 %   MCV 77.4 (*) 78.0 - 100.0 fL   MCH 26.5  26.0 - 34.0 pg   MCHC 34.2  30.0 - 36.0 g/dL   RDW 14.6  11.5 - 15.5 %   Platelets    150 - 400 K/uL   Value: PLATELET CLUMPS NOTED ON SMEAR, COUNT APPEARS ADEQUATE  GLUCOSE, CAPILLARY     Status: Abnormal   Collection Time    04/13/14  8:32 AM      Result Value Ref Range   Glucose-Capillary 411 (*) 70 - 99 mg/dL  GLUCOSE, RANDOM  Status: Abnormal   Collection Time    04/13/14  9:00 AM      Result Value Ref Range   Glucose, Bld 450 (*) 70 - 99 mg/dL  GLUCOSE, CAPILLARY     Status: Abnormal   Collection Time    04/13/14 10:52 AM      Result Value Ref Range   Glucose-Capillary 330 (*) 70 - 99 mg/dL   BASIC METABOLIC PANEL     Status: Abnormal   Collection Time    04/13/14 12:23 PM      Result Value Ref Range   Sodium 142  137 - 147 mEq/L   Potassium 3.8  3.7 - 5.3 mEq/L   Chloride 108  96 - 112 mEq/L   CO2 24  19 - 32 mEq/L   Glucose, Bld 208 (*) 70 - 99 mg/dL   BUN 13  6 - 23 mg/dL   Creatinine, Ser 1.22 (*) 0.50 - 1.10 mg/dL   Calcium 8.6  8.4 - 10.5 mg/dL   GFR calc non Af Amer 62 (*) >90 mL/min   GFR calc Af Amer 72 (*) >90 mL/min   Comment: (NOTE)     The eGFR has been calculated using the CKD EPI equation.     This calculation has not been validated in all clinical situations.     eGFR's persistently <90 mL/min signify possible Chronic Kidney     Disease.   Anion gap 10  5 - 15  GLUCOSE, CAPILLARY     Status: Abnormal   Collection Time    04/13/14  2:25 PM      Result Value Ref Range   Glucose-Capillary 45 (*) 70 - 99 mg/dL  GLUCOSE, CAPILLARY     Status: Abnormal   Collection Time    04/13/14  2:44 PM      Result Value Ref Range   Glucose-Capillary 61 (*) 70 - 99 mg/dL  GLUCOSE, CAPILLARY     Status: Abnormal   Collection Time    04/13/14  2:59 PM      Result Value Ref Range   Glucose-Capillary 67 (*) 70 - 99 mg/dL  GLUCOSE, CAPILLARY     Status: Abnormal   Collection Time    04/13/14  3:25 PM      Result Value Ref Range   Glucose-Capillary 116 (*) 70 - 99 mg/dL  GLUCOSE, CAPILLARY     Status: None   Collection Time    04/13/14  4:03 PM      Result Value Ref Range   Glucose-Capillary 95  70 - 99 mg/dL  GLUCOSE, CAPILLARY     Status: Abnormal   Collection Time    04/13/14 10:30 PM      Result Value Ref Range   Glucose-Capillary 317 (*) 70 - 99 mg/dL  BASIC METABOLIC PANEL     Status: Abnormal   Collection Time    04/14/14  4:58 AM      Result Value Ref Range   Sodium 139  137 - 147 mEq/L   Potassium 4.1  3.7 - 5.3 mEq/L   Chloride 105  96 - 112 mEq/L   CO2 24  19 - 32 mEq/L   Glucose, Bld 262 (*) 70 - 99 mg/dL   BUN 12  6 - 23 mg/dL    Creatinine, Ser 1.03  0.50 - 1.10 mg/dL   Calcium 8.6  8.4 - 10.5 mg/dL   GFR calc non Af Amer 76 (*) >90 mL/min   GFR calc Af  Amer 88 (*) >90 mL/min   Comment: (NOTE)     The eGFR has been calculated using the CKD EPI equation.     This calculation has not been validated in all clinical situations.     eGFR's persistently <90 mL/min signify possible Chronic Kidney     Disease.   Anion gap 10  5 - 15  CBC     Status: Abnormal   Collection Time    04/14/14  4:58 AM      Result Value Ref Range   WBC 5.2  4.0 - 10.5 K/uL   RBC 2.69 (*) 3.87 - 5.11 MIL/uL   Hemoglobin 7.2 (*) 12.0 - 15.0 g/dL   HCT 21.0 (*) 36.0 - 46.0 %   MCV 78.1  78.0 - 100.0 fL   MCH 26.8  26.0 - 34.0 pg   MCHC 34.3  30.0 - 36.0 g/dL   RDW 14.7  11.5 - 15.5 %   Platelets 300  150 - 400 K/uL  GLUCOSE, CAPILLARY     Status: Abnormal   Collection Time    04/14/14  8:00 AM      Result Value Ref Range   Glucose-Capillary 254 (*) 70 - 99 mg/dL  RETICULOCYTES     Status: Abnormal   Collection Time    04/14/14  9:35 AM      Result Value Ref Range   Retic Ct Pct 1.0  0.4 - 3.1 %   RBC. 2.48 (*) 3.87 - 5.11 MIL/uL   Retic Count, Manual 24.8  19.0 - 186.0 K/uL  HEMOGLOBIN AND HEMATOCRIT, BLOOD     Status: Abnormal   Collection Time    04/14/14  9:35 AM      Result Value Ref Range   Hemoglobin 6.8 (*) 12.0 - 15.0 g/dL   Comment: REPEATED TO VERIFY     CRITICAL RESULT CALLED TO, READ BACK BY AND VERIFIED WITH:     B.HALL,RN [id number] 04/14/14 M.CAMPBELL   HCT 19.9 (*) 36.0 - 46.0 %  GLUCOSE, CAPILLARY     Status: Abnormal   Collection Time    04/14/14 12:35 PM      Result Value Ref Range   Glucose-Capillary 170 (*) 70 - 99 mg/dL   Dg Chest 2 View  04/14/2014   CLINICAL DATA:  23 year old female with mid chest pain for the past 7 days. History of hyperkalemia induced cardiac arrest 2 weeks ago requiring CPR.  EXAM: CHEST  2 VIEW  COMPARISON:  Chest x-ray 04/12/2014.  FINDINGS: Left-sided subclavian central venous  catheter with tip terminating in the mid superior vena cava. New airspace consolidation in the right lower lobe concerning for pneumonia. Small bilateral pleural effusions. Linear opacities of the left lung base favored to reflect atelectasis, although additional airspace consolidation is not excluded. No evidence of pulmonary edema. Heart size is normal. Upper mediastinal contours are within normal limits.  IMPRESSION: 1. Basilar right lower lobe airspace consolidation is new compared to the prior study, concerning for pneumonia, with likely with small parapneumonic pleural effusion. 2. Probable subsegmental atelectasis in the left lung base is unchanged. Small left pleural effusion is also unchanged.   Electronically Signed   By: Vinnie Langton M.D.   On: 04/14/2014 12:25   Mr Brain Wo Contrast  04/13/2014   CLINICAL DATA:  Diabetic ketoacidosis with cardiac arrest and prolonged resuscitation. The patient has now recovered from the acute event but has persistent right-sided weakness.  EXAM: MRI HEAD WITHOUT CONTRAST  TECHNIQUE: Multiplanar,  multiecho pulse sequences of the brain and surrounding structures were obtained without intravenous contrast.  COMPARISON:  None.  FINDINGS: The diffusion-weighted images demonstrate a punctate area of restricted diffusion in the right coronal radiata adjacent to the lateral ventricle. No significant left-sided disease is evident. Focal T2 changes are associated with the area of acute infarction. No other significant white matter disease is present. There is no significant white matter change on the left.  Flow is present in the major intracranial arteries. The globes and orbits are intact.  The left maxillary sinus is opacified. Thick mucosal thickening is noted along the inferior right maxillary sinus. The anterior ethmoid air cells are opacified bilaterally right greater than left. The right frontal sinus is opacified. There is restricted diffusion within the areas of  signs opacification suggesting infection.  IMPRESSION: 1. Punctate area of acute nonhemorrhagic infarct involving the right coronal radiata adjacent to the lateral ventricle. 2. No significant left-sided disease. 3. Left maxillary, bilateral anterior ethmoid, and right frontal sinusitis with restricted diffusion consistent with acute infection.   Electronically Signed   By: Lawrence Santiago M.D.   On: 04/13/2014 19:23   Dg Chest Port 1 View  04/12/2014   CLINICAL DATA:  Recent onset of chest wall pain and cough ; history of diabetes and chronic renal failure  EXAM: PORTABLE CHEST - 1 VIEW  COMPARISON:  Portable chest x-ray of April 08, 2014  FINDINGS: The lungs are mildly hypoinflated. There is mildly increased density at the left lung base with blunting of the costophrenic angle. The heart and pulmonary vascularity are within the limits of normal. The mediastinum is normal in width. The left subclavian catheter tip projects over the proximal SVC. The bony thorax is unremarkable.  IMPRESSION: Increased density at the left lung base is consistent with atelectasis and small left pleural effusion. When the patient can tolerate the procedure, a PA and lateral chest x-ray would be of value.   Electronically Signed   By: David  Martinique   On: 04/12/2014 14:55    Assessment: 23 y.o. female s/p cardiac arrest and DKA, found to have a punctuate area of acute nonhemorrhagic infarct involving the right coronal radiata. Can be related to ischemia in he context of recent anoxic ischemic brain event or likely a DWI/ischemic abnormality from DKA (described mainly in children). Discussed with stroke attending who favors completing stroke work up. Stroke team will follow up tomorrow.  Stroke Risk Factors - DM  Plan: 1. HgbA1c, fasting lipid panel 2. MRI, MRA  of the brain without contrast 3. Echocardiogram 4. Carotid dopplers 5. Prophylactic therapy-aspirin 6. Risk factor modification 7. Telemetry monitoring 8.  Frequent neuro checks 9. PT/OT SLP (no needed)   Dorian Pod, MD [facility] [phone]  04/14/2014, 12:51 PM

## 2014-04-14 NOTE — Progress Notes (Signed)
Lab called results of H&H 6.8, MD notified.

## 2014-04-14 NOTE — Progress Notes (Signed)
OT Cancellation Note  Patient Details Name: [name] MRN: [id number] DOB: 1991-03-29   Cancelled Treatment:    Reason Eval/Treat Not Completed: Other (comment). Pt's Hg 6.8. Will check back as time allows.  Benito Mccreedy OTR/L [id number]  04/14/2014, 12:04 PM

## 2014-04-14 NOTE — Plan of Care (Signed)
     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  Call Lala Lund MD, [facility] [phone] with questions.  Thurnell Lose M.D on 04/14/2014,at 8:36 AM  [facility]   Office  [phone]

## 2014-04-14 NOTE — Progress Notes (Addendum)
Patient Demographics  [name], is a 23 y.o. female, DOB - 20-Jan-1991, [id number]  Admit date - 04/07/2014   Admitting Physician Wilhelmina Mcardle, MD  Outpatient Primary MD for the patient is Lorayne Marek, MD  LOS - 7   Chief Complaint  Patient presents with  . Cardiac Arrest      Brief summary. This is a 23 year old African American female with type 1 diabetes mellitus, noncompliant with medications who was admitted by critical care one week ago after hyperkalemia induced cardiac arrest requiring CPR and intubation, she was also in DKA at that time. She was treated supportively, underwent 2-D echogram which was stable with the EF of 55-60%, MRI brain showing an infarct likely due to ischemic injury during cardiac arrest, she also was found to be anemic received 2 units of packed RBC on 04/12/2014. On 04/14/2014 she was transferred out of the critical care to hospitalist service. I assumed her care on 04/14/2014. Today she is anemic, requiring 2 more units of packed RBC along with anemia workup.   Subjective:   [name] today has, No headache, No chest pain, No abdominal pain - No Nausea, No new weakness tingling or numbness, No Cough - SOB.    Assessment & Plan     1. Hyperkalemia induced cardiac arrest. Stable, echo noted with EF of 55-60% and no wall motion abnormality. No acute issues.   2. DKA in a type I diabetic with history of noncompliance. Counseled on compliance with insulin, increased Lantus for better control tinea sliding scale and monitor CBGs.  Lab Results  Component Value Date   HGBA1C 12.3* 02/14/2014    CBG (last 3)   Recent Labs  04/13/14 1603 04/13/14 2230 04/14/14 0800  GLUCAP 95 317* 254*     3. Diabetic neuropathy. On Neurontin  continue.    4. Microcytic anemia. Likely due to menstrual blood loss, received 2 units of packed RBC by critical care on 04/12/2014, will give her 2 more units on 10-25. Order anemia panel. Check occult blood in stool.    5. Infarct noted on MRI done by critical care on 04/13/2014. No focal deficits, this likely was due to cardiac arrest causing ischemia. We'll request neuro to evaluate one time.    6.Milia on the chest-back - monitor.      Code Status: Full  Family Communication: Dad bedside  Disposition Plan: Home   Procedures chest x-ray, ankle x-ray, MRI brain   Consults  PCCM   Medications  Scheduled Meds: . sodium chloride   Intravenous Once  . docusate sodium  100 mg Oral BID  . ferrous sulfate  300 mg Oral TID WC  . furosemide  20 mg Intravenous Once  . heparin subcutaneous  5,000 Units Subcutaneous 3 times per day  . insulin aspart  0-5 Units Subcutaneous QHS  . insulin aspart  0-9 Units Subcutaneous TID WC  . insulin aspart  5 Units Subcutaneous TID WC  . insulin glargine  25 Units Subcutaneous Daily  . polyethylene glycol  17 g Oral Daily   Continuous Infusions:  PRN Meds:.acetaminophen, diphenhydrAMINE, oxyCODONE-acetaminophen, phenol, sodium chloride  DVT Prophylaxis    Heparin   Lab Results  Component Value Date   PLT 300 04/14/2014  Antibiotics     Anti-infectives   None          Objective:   Filed Vitals:   04/13/14 1227 04/13/14 1803 04/13/14 2115 04/14/14 0506  BP: 80/41 113/69 111/79 109/62  Pulse: 105 106 116 107  Temp: 98.6 F (37 C) 99 F (37.2 C) 98.7 F (37.1 C) 98.7 F (37.1 C)  TempSrc: Oral Oral Oral Oral  Resp: 16 18 16 18   Height:      Weight:      SpO2: 99% 96% 97% 99%    Wt Readings from Last 3 Encounters:  04/10/14 49.2 kg (108 lb 7.5 oz)  03/06/14 49.714 kg (109 lb 9.6 oz)  02/12/14 50.2 kg (110 lb 10.7 oz)     Intake/Output Summary (Last 24 hours) at 04/14/14 1016 Last data filed at  04/14/14 0900  Gross per 24 hour  Intake    480 ml  Output   1000 ml  Net   -520 ml     Physical Exam  Awake Alert, Oriented X 3, No new F.N deficits, Normal affect [place].AT,PERRAL Supple Neck,No JVD, No cervical lymphadenopathy appriciated.  Symmetrical Chest wall movement, Good air movement bilaterally, CTAB RRR,No Gallops,Rubs or new Murmurs, No Parasternal Heave +ve B.Sounds, Abd Soft, No tenderness, No organomegaly appriciated, No rebound - guarding or rigidity. No Cyanosis, Clubbing or edema, diffuse milia on the trunk      Data Review   Micro Results Recent Results (from the past 240 hour(s))  MRSA PCR SCREENING     Status: None   Collection Time    04/07/14  6:32 PM      Result Value Ref Range Status   MRSA by PCR NEGATIVE  NEGATIVE Final   Comment:            The GeneXpert MRSA Assay (FDA     approved for NASAL specimens     only), is one component of a     comprehensive MRSA colonization     surveillance program. It is not     intended to diagnose MRSA     infection nor to guide or     monitor treatment for     MRSA infections.      SIGNIFICANT EVENTS:   10/18 - admitted after cardiac arrest in setting hyperkalemia secondary to severe DKA  10/19 - extubated  10/20 - transferred out of ICU to regular floor.  10/21 - started back on insulin gtt due to [place] increasing  10/22 - AG improved  10/22 - Hb 6.3 10/22, 6.1 10/23, pt refusing blood transfusion TTE 10/19 >> normal LV fxn, normal PAP's MRI 10/24 done by PCCM   Radiology Reports  TTE 10/19 >> normal LV fxn, normal PAP's   Mr Brain Wo Contrast  04/13/2014   CLINICAL DATA:  Diabetic ketoacidosis with cardiac arrest and prolonged resuscitation. The patient has now recovered from the acute event but has persistent right-sided weakness.  EXAM: MRI HEAD WITHOUT CONTRAST  TECHNIQUE: Multiplanar, multiecho pulse sequences of the brain and surrounding structures were obtained without intravenous contrast.   COMPARISON:  None.  FINDINGS: The diffusion-weighted images demonstrate a punctate area of restricted diffusion in the right coronal radiata adjacent to the lateral ventricle. No significant left-sided disease is evident. Focal T2 changes are associated with the area of acute infarction. No other significant white matter disease is present. There is no significant white matter change on the left.  Flow is present in the major intracranial arteries. The  globes and orbits are intact.  The left maxillary sinus is opacified. Thick mucosal thickening is noted along the inferior right maxillary sinus. The anterior ethmoid air cells are opacified bilaterally right greater than left. The right frontal sinus is opacified. There is restricted diffusion within the areas of signs opacification suggesting infection.  IMPRESSION: 1. Punctate area of acute nonhemorrhagic infarct involving the right coronal radiata adjacent to the lateral ventricle. 2. No significant left-sided disease. 3. Left maxillary, bilateral anterior ethmoid, and right frontal sinusitis with restricted diffusion consistent with acute infection.   Electronically Signed   By: Lawrence Santiago M.D.   On: 04/13/2014 19:23   Dg Chest Port 1 View  04/12/2014   CLINICAL DATA:  Recent onset of chest wall pain and cough ; history of diabetes and chronic renal failure  EXAM: PORTABLE CHEST - 1 VIEW  COMPARISON:  Portable chest x-ray of April 08, 2014  FINDINGS: The lungs are mildly hypoinflated. There is mildly increased density at the left lung base with blunting of the costophrenic angle. The heart and pulmonary vascularity are within the limits of normal. The mediastinum is normal in width. The left subclavian catheter tip projects over the proximal SVC. The bony thorax is unremarkable.  IMPRESSION: Increased density at the left lung base is consistent with atelectasis and small left pleural effusion. When the patient can tolerate the procedure, a PA and  lateral chest x-ray would be of value.   Electronically Signed   By: David  Martinique   On: 04/12/2014 14:55      Dg Ankle Right Port  04/09/2014   CLINICAL DATA:  Pain and swelling involving lateral malleolus of right ankle after fall 2 days ago.  EXAM: PORTABLE RIGHT ANKLE - 2 VIEW  COMPARISON:  None.  FINDINGS: No acute fracture or dislocation is identified. The ankle mortise shows normal alignment. Mild soft tissue swelling present. No significant arthropathy. No bony lesions or destruction.  IMPRESSION: No acute fracture.   Electronically Signed   By: Aletta Edouard M.D.   On: 04/09/2014 20:21     CBC  Recent Labs Lab 04/07/14 1436  04/09/14 0600 04/11/14 0400 04/12/14 0715 04/13/14 0620 04/14/14 0458 04/14/14 0935  WBC 11.7*  < > 8.8 5.7 2.9* 3.6* 5.2  --   HGB 10.2*  < > 7.2* 6.3* 6.1* 7.6* 7.2* 6.8*  HCT 38.7  < > 20.1* 18.5* 17.9* 22.2* 21.0* 19.9*  PLT 235  < > 136* 103* 126* PLATELET CLUMPS NOTED ON SMEAR, COUNT APPEARS ADEQUATE 300  --   MCV 100.3*  < > 74.7* 75.8* 78.9 77.4* 78.1  --   MCH 26.4  < > 26.8 25.8* 26.9 26.5 26.8  --   MCHC 26.4*  < > 35.8 34.1 34.1 34.2 34.3  --   RDW 13.6  < > 13.3 14.5 15.0 14.6 14.7  --   LYMPHSABS 4.0  --   --   --   --   --   --   --   MONOABS 0.5  --   --   --   --   --   --   --   EOSABS 0.0  --   --   --   --   --   --   --   BASOSABS 0.0  --   --   --   --   --   --   --   < > = values in this interval not displayed.  Chemistries   Recent Labs Lab 04/07/14 1436  04/11/14 0400 04/11/14 0813 04/11/14 1413 04/12/14 1518 04/13/14 0900 04/13/14 1223 04/14/14 0458  NA 122*  < > 145 142 141 141  --  142 139  K >7.7*  < > 3.5* 3.6* 3.9 4.2  --  3.8 4.1  CL 73*  < > 114* 110 109 108  --  108 105  CO2 10*  < > 22 20 22 22   --  24 24  GLUCOSE 2000*  < > 165* 296* 227* 243* 450* 208* 262*  BUN 71*  < > 14 13 13 13   --  13 12  CREATININE 3.11*  < > 1.24* 1.19* 1.20* 1.12*  --  1.22* 1.03  CALCIUM >15.0*  < > 8.2* 8.1* 8.0* 8.3*   --  8.6 8.6  MG  --   --  1.7  --   --   --   --   --   --   AST 337*  --   --   --   --   --   --   --   --   ALT 198*  --   --   --   --   --   --   --   --   ALKPHOS 113  --   --   --   --   --   --   --   --   BILITOT <0.2*  --   --   --   --   --   --   --   --   < > = values in this interval not displayed. ------------------------------------------------------------------------------------------------------------------ estimated creatinine clearance is 64.1 ml/min (by C-G formula based on Cr of 1.03). ------------------------------------------------------------------------------------------------------------------ No results found for this basename: HGBA1C,  in the last 72 hours ------------------------------------------------------------------------------------------------------------------ No results found for this basename: CHOL, HDL, LDLCALC, TRIG, CHOLHDL, LDLDIRECT,  in the last 72 hours ------------------------------------------------------------------------------------------------------------------ No results found for this basename: TSH, T4TOTAL, FREET3, T3FREE, THYROIDAB,  in the last 72 hours ------------------------------------------------------------------------------------------------------------------  Recent Labs  04/14/14 0935  RETICCTPCT 1.0    Coagulation profile No results found for this basename: INR, PROTIME,  in the last 168 hours  No results found for this basename: DDIMER,  in the last 72 hours  Cardiac Enzymes  Recent Labs Lab 04/08/14 0400  TROPONINI 0.47*   ------------------------------------------------------------------------------------------------------------------ No components found with this basename: POCBNP,      Time Spent in minutes   35   Jaelan Rasheed K M.D on 04/14/2014 at 10:16 AM  Between 7am to 7pm - Pager - [phone]  After 7pm go to [email or web address] - password [email or web address]  And look for the night coverage person covering  for me after hours  [facility] Office  [phone]

## 2014-04-15 DIAGNOSIS — E1029 Type 1 diabetes mellitus with other diabetic kidney complication: Secondary | ICD-10-CM

## 2014-04-15 DIAGNOSIS — I639 Cerebral infarction, unspecified: Secondary | ICD-10-CM

## 2014-04-15 DIAGNOSIS — N189 Chronic kidney disease, unspecified: Secondary | ICD-10-CM

## 2014-04-15 DIAGNOSIS — N179 Acute kidney failure, unspecified: Secondary | ICD-10-CM

## 2014-04-15 LAB — HEMOGLOBIN A1C
HEMOGLOBIN A1C: 9.2 % — AB (ref <5.7)
MEAN PLASMA GLUCOSE: 217 mg/dL — AB (ref <117)

## 2014-04-15 LAB — GLUCOSE, CAPILLARY
GLUCOSE-CAPILLARY: 238 mg/dL — AB (ref 70–99)
Glucose-Capillary: 216 mg/dL — ABNORMAL HIGH (ref 70–99)
Glucose-Capillary: 75 mg/dL (ref 70–99)
Glucose-Capillary: 126 mg/dL — ABNORMAL HIGH (ref 70–99)

## 2014-04-15 LAB — LIPID PANEL
Cholesterol: 139 mg/dL (ref 0–200)
HDL: 48 mg/dL (ref >39)
LDL Cholesterol: 47 mg/dL (ref 0–99)
TRIGLYCERIDES: 218 mg/dL — AB (ref <150)
Total CHOL/HDL Ratio: 2.9 RATIO
VLDL: 44 mg/dL — ABNORMAL HIGH (ref 0–40)

## 2014-04-15 LAB — HEMOGLOBIN AND HEMATOCRIT, BLOOD
HEMATOCRIT: 28.9 % — AB (ref 36.0–46.0)
HEMOGLOBIN: 9.9 g/dL — AB (ref 12.0–15.0)

## 2014-04-15 MED ORDER — INSULIN ASPART 100 UNIT/ML ~~LOC~~ SOLN
3.0000 [IU] | Freq: Three times a day (TID) | SUBCUTANEOUS | Status: DC
  Administered 2014-04-15 – 2014-04-16 (×2): 3 [IU] via SUBCUTANEOUS

## 2014-04-15 MED ORDER — VANCOMYCIN HCL IN DEXTROSE 750-5 MG/150ML-% IV SOLN
750.0000 mg | Freq: Two times a day (BID) | INTRAVENOUS | Status: DC
  Administered 2014-04-15: 750 mg via INTRAVENOUS
  Filled 2014-04-15 (×2): qty 150

## 2014-04-15 MED ORDER — INSULIN GLARGINE 100 UNIT/ML ~~LOC~~ SOLN
30.0000 [IU] | Freq: Every day | SUBCUTANEOUS | Status: DC
  Administered 2014-04-16: 30 [IU] via SUBCUTANEOUS
  Filled 2014-04-15: qty 0.3

## 2014-04-15 MED ORDER — INSULIN GLARGINE 100 UNIT/ML ~~LOC~~ SOLN
10.0000 [IU] | Freq: Once | SUBCUTANEOUS | Status: DC
  Filled 2014-04-15: qty 0.1

## 2014-04-15 MED ORDER — KETOROLAC TROMETHAMINE 15 MG/ML IJ SOLN
15.0000 mg | Freq: Once | INTRAMUSCULAR | Status: AC
  Administered 2014-04-15: 15 mg via INTRAVENOUS
  Filled 2014-04-15: qty 1

## 2014-04-15 MED ORDER — METHOCARBAMOL 500 MG PO TABS: 500.0000 mg | ORAL_TABLET | Freq: Three times a day (TID) | ORAL | Status: DC | PRN

## 2014-04-15 MED ORDER — INSULIN GLARGINE 100 UNIT/ML ~~LOC~~ SOLN: 35.0000 [IU] | Freq: Every day | SUBCUTANEOUS | Status: DC

## 2014-04-15 MED ORDER — AMOXICILLIN-POT CLAVULANATE 875-125 MG PO TABS
1.0000 | ORAL_TABLET | Freq: Two times a day (BID) | ORAL | Status: DC
  Administered 2014-04-15 – 2014-04-16 (×3): 1 via ORAL
  Filled 2014-04-15 (×4): qty 1

## 2014-04-15 MED ORDER — ASPIRIN 81 MG PO CHEW
81.0000 mg | CHEWABLE_TABLET | Freq: Every day | ORAL | Status: DC
  Administered 2014-04-15 – 2014-04-16 (×2): 81 mg via ORAL
  Filled 2014-04-15 (×3): qty 1

## 2014-04-15 MED ORDER — DEXTROSE 5 % IV SOLN
1.0000 g | Freq: Three times a day (TID) | INTRAVENOUS | Status: DC
  Administered 2014-04-15 (×2): 1 g via INTRAVENOUS
  Filled 2014-04-15 (×4): qty 1

## 2014-04-15 NOTE — Plan of Care (Addendum)
NP taking call at [facility] Archivist) called to request face to face eval on this pt. Chart reviewed. Having CP-EKG normal. On exam chest wall exquisitely tender (pt post CPR at time of admit). CXR today reveals RLL opacity which could reflect PNA although more accurately reflects atelectasis given CW pain and fair IS effort of 1000cc. Will give 1 x dose Toradol IV (has low normal GFR) and add Robaxin for CW pain. No leukocytosis but developing low grade fever so begin empiric anbx to cover for HCAP. Pt encouraged to cont pulm toilet and mobilized. OK to give blood with low grade fever. Low hgb could be factor since baseline around 10-11.  Erin Hearing, ANP

## 2014-04-15 NOTE — Progress Notes (Addendum)
Patient Demographics  [name], is a 23 y.o. female, DOB - Jan 13, 1991, SN:[id number]  Admit date - 04/07/2014   Admitting Physician Wilhelmina Mcardle, MD  Outpatient Primary MD for the patient is Lorayne Marek, MD  LOS - 8   Chief Complaint  Patient presents with  . Cardiac Arrest      Brief summary. This is a 23 year old African American female with type 1 diabetes mellitus, noncompliant with medications who was admitted by critical care one week ago after hyperkalemia induced cardiac arrest requiring CPR and intubation, she was also in DKA at that time. She was treated supportively, underwent 2-D echogram which was stable with the EF of 55-60%, MRI brain showing an infarct likely due to ischemic injury during cardiac arrest, she also was found to be anemic received 2 units of packed RBC on 04/12/2014. On 04/14/2014 she was transferred out of the critical care to hospitalist service. I assumed her care on 04/14/2014. Today she is anemic, requiring 2 more units of packed RBC along with anemia workup.   Subjective:   [name] today has, No headache, No chest pain, No abdominal pain - No Nausea, No new weakness tingling or numbness, No Cough - SOB.    Assessment & Plan     1. Hyperkalemia induced cardiac arrest. Stable, echo noted with EF of 55-60% and no wall motion abnormality. No acute issues.    2. DKA in a type I diabetic with history of noncompliance. Counseled on compliance with insulin, increased Lantus for better control continue sliding scale and monitor CBGs.  Lab Results  Component Value Date   HGBA1C 12.3* 02/14/2014    CBG (last 3)   Recent Labs  04/14/14 1720 04/14/14 2213 04/15/14 0750  GLUCAP 133* 94 216*     3. Diabetic neuropathy. On Neurontin  continue.    4. Microcytic anemia. Likely due to menstrual blood loss, received 2 units of packed RBC by critical care on 04/12/2014, had 2 more units on 10-25. H&H stable now. Anemia panel suggestive of iron deficiency, placed on iron supplementation orally.    5. Infarct noted on MRI done by critical care on 04/13/2014. No focal deficits, this likely was due to cardiac arrest causing ischemia. Neuro following. Echogram stable, HDL less than 70, A1c is high as expected due to noncompliance with diabetic medications, patient educated and counseled. Carotid Dopplers pending, aspirin 81 per neuro. Per neuro no need for PT-OT-speech eval. Stable on telemetry.    6.Milia on the chest-back - monitor.    7.HCAP - likely aspirated in the ER when she coded, empiric IV antibiotics for 24 hours. Clinically stable. Monitor closely.     Code Status: Full  Family Communication: Dad bedside, mother bedside  Disposition Plan: Home likely 04/16/2014   Procedures chest x-ray, ankle x-ray, MRI brain, echogram, carotid duplex   Consults  PCCM   Medications  Scheduled Meds: . ceFEPime (MAXIPIME) IV  1 g Intravenous 3 times per day  . ferrous sulfate  300 mg Oral TID WC  . heparin subcutaneous  5,000 Units Subcutaneous 3 times per day  . insulin aspart  0-5 Units Subcutaneous QHS  . insulin aspart  0-9 Units Subcutaneous TID WC  . insulin aspart  5 Units Subcutaneous TID WC  . insulin glargine  25 Units Subcutaneous Daily  . polyethylene glycol  17 g Oral Daily  . vancomycin  750 mg Intravenous Q12H   Continuous Infusions:  PRN Meds:.acetaminophen, dextromethorphan, diphenhydrAMINE, methocarbamol, oxyCODONE-acetaminophen, phenol, sodium chloride  DVT Prophylaxis    Heparin   Lab Results  Component Value Date   PLT 300 04/14/2014    Antibiotics     Anti-infectives   Start     Dose/Rate Route Frequency Ordered Stop   04/15/14 0200  vancomycin (VANCOCIN) IVPB 750 mg/150 ml premix      750 mg 150 mL/hr over 60 Minutes Intravenous Every 12 hours 04/15/14 0142     04/15/14 0145  ceFEPIme (MAXIPIME) 1 g in dextrose 5 % 50 mL IVPB     1 g 100 mL/hr over 30 Minutes Intravenous 3 times per day 04/15/14 0142            Objective:   Filed Vitals:   04/15/14 0032 04/15/14 0139 04/15/14 0200 04/15/14 0431  BP: 117/57 120/77 117/70 116/68  Pulse: 102 100 96 98  Temp: 99.5 F (37.5 C) 99.2 F (37.3 C) 99.4 F (37.4 C) 98.1 F (36.7 C)  TempSrc: Oral Oral Oral Oral  Resp: 20 24 20 20   Height:      Weight:      SpO2: 98% 100% 99% 99%    Wt Readings from Last 3 Encounters:  04/10/14 49.2 kg (108 lb 7.5 oz)  03/06/14 49.714 kg (109 lb 9.6 oz)  02/12/14 50.2 kg (110 lb 10.7 oz)     Intake/Output Summary (Last 24 hours) at 04/15/14 1101 Last data filed at 04/15/14 0600  Gross per 24 hour  Intake   1895 ml  Output   2750 ml  Net   -855 ml     Physical Exam  Awake Alert, Oriented X 3, No new F.N deficits, Normal affect [place].AT,PERRAL Supple Neck,No JVD, No cervical lymphadenopathy appriciated.  Symmetrical Chest wall movement, Good air movement bilaterally, CTAB RRR,No Gallops,Rubs or new Murmurs, No Parasternal Heave +ve B.Sounds, Abd Soft, No tenderness, No organomegaly appriciated, No rebound - guarding or rigidity. No Cyanosis, Clubbing or edema, diffuse milia on the trunk      Data Review   Micro Results Recent Results (from the past 240 hour(s))  MRSA PCR SCREENING     Status: None   Collection Time    04/07/14  6:32 PM      Result Value Ref Range Status   MRSA by PCR NEGATIVE  NEGATIVE Final   Comment:            The GeneXpert MRSA Assay (FDA     approved for NASAL specimens     only), is one component of a     comprehensive MRSA colonization     surveillance program. It is not     intended to diagnose MRSA     infection nor to guide or     monitor treatment for     MRSA infections.      SIGNIFICANT EVENTS:   10/18 - admitted  after cardiac arrest in setting hyperkalemia secondary to severe DKA  10/19 - extubated  10/20 - transferred out of ICU to regular floor.  10/21 - started back on insulin gtt due to [place] increasing  10/22 - AG improved  10/22 - Hb 6.3 10/22, 6.1 10/23, pt refusing blood transfusion TTE 10/19 >> normal LV fxn, normal PAP's MRI 10/24 done by  PCCM   Radiology Reports  TTE 10/19 >> normal LV fxn, normal PAP's   Mr Brain Wo Contrast  04/13/2014   CLINICAL DATA:  Diabetic ketoacidosis with cardiac arrest and prolonged resuscitation. The patient has now recovered from the acute event but has persistent right-sided weakness.  EXAM: MRI HEAD WITHOUT CONTRAST  TECHNIQUE: Multiplanar, multiecho pulse sequences of the brain and surrounding structures were obtained without intravenous contrast.  COMPARISON:  None.  FINDINGS: The diffusion-weighted images demonstrate a punctate area of restricted diffusion in the right coronal radiata adjacent to the lateral ventricle. No significant left-sided disease is evident. Focal T2 changes are associated with the area of acute infarction. No other significant white matter disease is present. There is no significant white matter change on the left.  Flow is present in the major intracranial arteries. The globes and orbits are intact.  The left maxillary sinus is opacified. Thick mucosal thickening is noted along the inferior right maxillary sinus. The anterior ethmoid air cells are opacified bilaterally right greater than left. The right frontal sinus is opacified. There is restricted diffusion within the areas of signs opacification suggesting infection.  IMPRESSION: 1. Punctate area of acute nonhemorrhagic infarct involving the right coronal radiata adjacent to the lateral ventricle. 2. No significant left-sided disease. 3. Left maxillary, bilateral anterior ethmoid, and right frontal sinusitis with restricted diffusion consistent with acute infection.   Electronically  Signed   By: Lawrence Santiago M.D.   On: 04/13/2014 19:23   Dg Chest Port 1 View  04/12/2014   CLINICAL DATA:  Recent onset of chest wall pain and cough ; history of diabetes and chronic renal failure  EXAM: PORTABLE CHEST - 1 VIEW  COMPARISON:  Portable chest x-ray of April 08, 2014  FINDINGS: The lungs are mildly hypoinflated. There is mildly increased density at the left lung base with blunting of the costophrenic angle. The heart and pulmonary vascularity are within the limits of normal. The mediastinum is normal in width. The left subclavian catheter tip projects over the proximal SVC. The bony thorax is unremarkable.  IMPRESSION: Increased density at the left lung base is consistent with atelectasis and small left pleural effusion. When the patient can tolerate the procedure, a PA and lateral chest x-ray would be of value.   Electronically Signed   By: David  Martinique   On: 04/12/2014 14:55      Dg Ankle Right Port  04/09/2014   CLINICAL DATA:  Pain and swelling involving lateral malleolus of right ankle after fall 2 days ago.  EXAM: PORTABLE RIGHT ANKLE - 2 VIEW  COMPARISON:  None.  FINDINGS: No acute fracture or dislocation is identified. The ankle mortise shows normal alignment. Mild soft tissue swelling present. No significant arthropathy. No bony lesions or destruction.  IMPRESSION: No acute fracture.   Electronically Signed   By: Aletta Edouard M.D.   On: 04/09/2014 20:21     CBC  Recent Labs Lab 04/09/14 0600 04/11/14 0400 04/12/14 0715 04/13/14 0620 04/14/14 0458 04/14/14 0935 04/15/14 0540  WBC 8.8 5.7 2.9* 3.6* 5.2  --   --   HGB 7.2* 6.3* 6.1* 7.6* 7.2* 6.8* 9.9*  HCT 20.1* 18.5* 17.9* 22.2* 21.0* 19.9* 28.9*  PLT 136* 103* 126* PLATELET CLUMPS NOTED ON SMEAR, COUNT APPEARS ADEQUATE 300  --   --   MCV 74.7* 75.8* 78.9 77.4* 78.1  --   --   MCH 26.8 25.8* 26.9 26.5 26.8  --   --   MCHC 35.8  34.1 34.1 34.2 34.3  --   --   RDW 13.3 14.5 15.0 14.6 14.7  --   --      Chemistries   Recent Labs Lab 04/11/14 0400 04/11/14 0813 04/11/14 1413 04/12/14 1518 04/13/14 0900 04/13/14 1223 04/14/14 0458  NA 145 142 141 141  --  142 139  K 3.5* 3.6* 3.9 4.2  --  3.8 4.1  CL 114* 110 109 108  --  108 105  CO2 22 20 22 22   --  24 24  GLUCOSE 165* 296* 227* 243* 450* 208* 262*  BUN 14 13 13 13   --  13 12  CREATININE 1.24* 1.19* 1.20* 1.12*  --  1.22* 1.03  CALCIUM 8.2* 8.1* 8.0* 8.3*  --  8.6 8.6  MG 1.7  --   --   --   --   --   --    ------------------------------------------------------------------------------------------------------------------ estimated creatinine clearance is 64.1 ml/min (by C-G formula based on Cr of 1.03). ------------------------------------------------------------------------------------------------------------------ No results found for this basename: HGBA1C,  in the last 72 hours ------------------------------------------------------------------------------------------------------------------  Recent Labs  04/15/14 0540  CHOL 139  HDL 48  LDLCALC 47  TRIG 218*  CHOLHDL 2.9   ------------------------------------------------------------------------------------------------------------------ No results found for this basename: TSH, T4TOTAL, FREET3, T3FREE, THYROIDAB,  in the last 72 hours ------------------------------------------------------------------------------------------------------------------  Recent Labs  04/14/14 0935  VITAMINB12 775  FOLATE 10.8  FERRITIN 359*  TIBC 175*  IRON 20*  RETICCTPCT 1.0    Coagulation profile No results found for this basename: INR, PROTIME,  in the last 168 hours  No results found for this basename: DDIMER,  in the last 72 hours  Cardiac Enzymes No results found for this basename: CK, CKMB, TROPONINI, MYOGLOBIN,  in the last 168 hours ------------------------------------------------------------------------------------------------------------------ No components  found with this basename: POCBNP,      Time Spent in minutes   35   Katanya Schlie K M.D on 04/15/2014 at 11:01 AM  Between 7am to 7pm - Pager - [phone]  After 7pm go to [email or web address] - password [email or web address]  And look for the night coverage person covering for me after hours  [facility] Office  [phone]

## 2014-04-15 NOTE — Progress Notes (Signed)
Patient receive one 81mg  aspirin. Patient vomited medication back up. Patient given another dose and told to chew instead of swallowing whole.

## 2014-04-15 NOTE — Progress Notes (Signed)
Inpatient Diabetes Program Recommendations  AACE/ADA: New Consensus Statement on Inpatient Glycemic Control (2013)  Target Ranges:  Prepandial:   less than 140 mg/dL      Peak postprandial:   less than 180 mg/dL (1-2 hours)      Critically ill patients:  140 - 180 mg/dL   Reason for Visit:  Spoke briefly with patient and mother regarding diabetes care.  Mother states that she is unsure when patient is being discharged but is concerned about making sure that patient is able to get her medications and see MD.  She states that they had applied for Medicaid in the past but were denied.  Called financial counselor to request for them to speak to patient/family.  CBG's improved.  Will follow.  Thanks, Adah Perl, RN, BC-ADM Inpatient Diabetes Coordinator Pager [phone]

## 2014-04-15 NOTE — Progress Notes (Signed)
Physical Therapy Treatment Patient Details Name: DURA DOUTHAT MRN: [id number] DOB: 03-28-1991 Today's Date: 04/15/2014    History of Present Illness 17F admitted with very severe DKA and s/p prolonged cardiac arrest (reportedly > 20 mins CPR) due to hyperkalemia.  On 10/24 MRI found pt to have R Corona Radiata Infarct.      PT Comments    Pt with much improved mobility today and BPs remained WFL throughout mobility.  Pt continues to have flat affect, but does answer questions appropriately and participates.  Will continue to follow if remains on acute.    Follow Up Recommendations  Home health PT;Supervision/Assistance - 24 hour     Equipment Recommendations  None recommended by PT    Recommendations for Other Services       Precautions / Restrictions Precautions Precautions: Fall Restrictions Weight Bearing Restrictions: No    Mobility  Bed Mobility Overal bed mobility: Modified Independent             General bed mobility comments: pt moves slowly, but no A needed.    Transfers Overall transfer level: Needs assistance Equipment used: None Transfers: Sit to/from Stand Sit to Stand: Supervision         General transfer comment: pt demos good safety.    Ambulation/Gait Ambulation/Gait assistance: Min guard Ambulation Distance (Feet): 160 Feet Assistive device: Rolling walker (2 wheeled);None Gait Pattern/deviations: Step-through pattern;Decreased stride length     General Gait Details: pt intially ambulating with RW, then with no AD.  pt moving well, no c/o dizziness and BP maintained WFLs.     Stairs            Wheelchair Mobility    Modified Rankin (Stroke Patients Only)       Balance Overall balance assessment: Needs assistance         Standing balance support: No upper extremity supported Standing balance-Leahy Scale: Good                      Cognition Arousal/Alertness: Awake/alert Behavior During Therapy:  Flat affect Overall Cognitive Status: Within Functional Limits for tasks assessed                      Exercises      General Comments        Pertinent Vitals/Pain Pain Assessment: No/denies pain    Home Living                      Prior Function            PT Goals (current goals can now be found in the care plan section) Acute Rehab PT Goals Patient Stated Goal: return to finish school PT Goal Formulation: With patient Time For Goal Achievement: 04/23/14 Potential to Achieve Goals: Good Progress towards PT goals: Progressing toward goals    Frequency  Min 3X/week    PT Plan Equipment recommendations need to be updated    Co-evaluation             End of Session Equipment Utilized During Treatment: Gait belt Activity Tolerance: Patient tolerated treatment well Patient left: in bed;with call bell/phone within reach (Sitting EOB awaiting OT.)     Time: [id number] PT Time Calculation (min): 19 min  Charges:  $Gait Training: 8-22 mins                    G Codes:      Kyrillos Adams, Barista  F, [place] [phone] 04/15/2014, 3:36 PM

## 2014-04-15 NOTE — Progress Notes (Signed)
STROKE TEAM PROGRESS NOTE   HISTORY [name] is an 23 y.o. female with a past medical history significant for type 1 diabetes mellitus, noncompliant with medications who was admitted by critical care one week ago after hyperkalemia induced cardiac arrest requiring CPR and intubation. Patient was also found to be on DKA at the time of admission.  As past of work up, she had a brain MRI that disclosed a punctate area of acute nonhemorrhagic infarct involving the right coronal radiata and neurology was asked to evaluate the patient.  She said that she is not aware of having stroke like symptoms before presenting to the hospital. Presently, she denies HA, vertigo, double vision, difficulty swallowing, focal weakness, slurred speech , confusion, disorientation, language or vision impairment. Some numbness in her feet.  Date last known well: uncertain  Time last known well: uncertain  tPA Given: no, patient asymptomatic from a neuro standpoint.  SUBJECTIVE (INTERVAL HISTORY) Her mom is at the bedside.  Overall she feels her condition is stable. She does not have focal neurological deficit at this time. She continues to cough due to pneumonia.  OBJECTIVE Temp:  [98.1 F (36.7 C)-99.8 F (37.7 C)] 98.1 F (36.7 C) (10/26 0431) Pulse Rate:  [96-108] 98 (10/26 0431) Cardiac Rhythm:  [-]  Resp:  [15-24] 20 (10/26 0431) BP: (97-123)/(52-78) 116/68 mmHg (10/26 0431) SpO2:  [97 %-100 %] 99 % (10/26 0431)   Recent Labs Lab 04/14/14 0800 04/14/14 1235 04/14/14 1720 04/14/14 2213 04/15/14 0750  GLUCAP 254* 170* 133* 94 216*    Recent Labs Lab 04/11/14 0400 04/11/14 0813 04/11/14 1413 04/12/14 1518 04/13/14 0900 04/13/14 1223 04/14/14 0458  NA 145 142 141 141  --  142 139  K 3.5* 3.6* 3.9 4.2  --  3.8 4.1  CL 114* 110 109 108  --  108 105  CO2 22 20 22 22   --  24 24  GLUCOSE 165* 296* 227* 243* 450* 208* 262*  BUN 14 13 13 13   --  13 12  CREATININE 1.24* 1.19* 1.20* 1.12*   --  1.22* 1.03  CALCIUM 8.2* 8.1* 8.0* 8.3*  --  8.6 8.6  MG 1.7  --   --   --   --   --   --   PHOS 2.7  --   --   --   --   --   --    No results found for this basename: AST, ALT, ALKPHOS, BILITOT, PROT, ALBUMIN,  in the last 168 hours  Recent Labs Lab 04/09/14 0600 04/11/14 0400 04/12/14 0715 04/13/14 0620 04/14/14 0458 04/14/14 0935 04/15/14 0540  WBC 8.8 5.7 2.9* 3.6* 5.2  --   --   HGB 7.2* 6.3* 6.1* 7.6* 7.2* 6.8* 9.9*  HCT 20.1* 18.5* 17.9* 22.2* 21.0* 19.9* 28.9*  MCV 74.7* 75.8* 78.9 77.4* 78.1  --   --   PLT 136* 103* 126* PLATELET CLUMPS NOTED ON SMEAR, COUNT APPEARS ADEQUATE 300  --   --    No results found for this basename: CKTOTAL, CKMB, CKMBINDEX, TROPONINI,  in the last 168 hours No results found for this basename: LABPROT, INR,  in the last 72 hours No results found for this basename: COLORURINE, APPERANCEUR, LABSPEC, PHURINE, GLUCOSEU, HGBUR, BILIRUBINUR, KETONESUR, PROTEINUR, UROBILINOGEN, NITRITE, LEUKOCYTESUR,  in the last 72 hours     Component Value Date/Time   CHOL 139 04/15/2014 0540   TRIG 218* 04/15/2014 0540   HDL 48 04/15/2014 0540   CHOLHDL  2.9 04/15/2014 0540   VLDL 44* 04/15/2014 0540   LDLCALC 47 04/15/2014 0540   Lab Results  Component Value Date   HGBA1C 12.3* 02/14/2014      Component Value Date/Time   LABOPIA NONE DETECTED 04/07/2014 1457   COCAINSCRNUR NONE DETECTED 04/07/2014 1457   LABBENZ NONE DETECTED 04/07/2014 1457   AMPHETMU NONE DETECTED 04/07/2014 1457   THCU NONE DETECTED 04/07/2014 1457   LABBARB NONE DETECTED 04/07/2014 1457    No results found for this basename: ETH,  in the last 168 hours  Dg Chest 2 View  04/14/2014   IMPRESSION: 1. Basilar right lower lobe airspace consolidation is new compared to the prior study, concerning for pneumonia, with likely with small parapneumonic pleural effusion. 2. Probable subsegmental atelectasis in the left lung base is unchanged. Small left pleural effusion is also unchanged.       Mr Brain Wo Contrast  04/13/2014    IMPRESSION: 1. Punctate area of acute nonhemorrhagic infarct involving the right coronal radiata adjacent to the lateral ventricle. 2. No significant left-sided disease. 3. Left maxillary, bilateral anterior ethmoid, and right frontal sinusitis with restricted diffusion consistent with acute infection.     CUS - Bilateral: 1-39% ICA stenosis. Vertebral artery flow is antegrade.  2D echo - - Left ventricle: The cavity size was normal. Wall thickness was normal. Systolic function was normal. The estimated ejection fraction was in the range of 55% to 60%. Left ventricular diastolic function parameters were normal. - Pulmonary arteries: PA peak pressure: 31 mm Hg (S).  LDL 47 and A1C pending  PHYSICAL EXAM  Temp:  [98.1 F (36.7 C)-99.8 F (37.7 C)] 98.1 F (36.7 C) (10/26 0431) Pulse Rate:  [96-108] 98 (10/26 0431) Resp:  [15-24] 20 (10/26 0431) BP: (97-123)/(52-78) 116/68 mmHg (10/26 0431) SpO2:  [97 %-100 %] 99 % (10/26 0431)  General - Well nourished, well developed, she had bad cough intermittently.  Ophthalmologic - Sharp disc margins OU.  Cardiovascular - Regular rate and rhythm with no murmur.  Mental Status -  Level of arousal and orientation to time, place, and person were intact. Language including expression, naming, repetition, comprehension was assessed and found intact. However, her voice is very soft due to constant coughing.  Cranial Nerves II - XII - II - Visual field intact OU. III, IV, VI - Extraocular movements intact. V - Facial sensation intact bilaterally. VII - Facial movement intact bilaterally. VIII - Hearing & vestibular intact bilaterally. X - Palate elevates symmetrically, voice is soft / hoarse due to cough. XI - Chin turning & shoulder shrug intact bilaterally. XII - Tongue protrusion intact.  Motor Strength - The patient's strength was normal in all extremities and pronator drift was absent.  Bulk was  normal and fasciculations were absent.   Motor Tone - Muscle tone was assessed at the neck and appendages and was normal.  Reflexes - The patient's reflexes were normal in all extremities and she had no pathological reflexes.  Sensory - Light touch, temperature/pinprick were assessed and were normal.    Coordination - The patient had normal movements in the hands and feet with no ataxia or dysmetria.  Tremor was absent.  Gait and Station - deferred due to constant cough.   ASSESSMENT/PLAN [name] is a 23 y.o. female with history of type I DM not compliant with meds admitted for DKA, cardiac arrest with aspiration pneumonia requiring intubation. She recovered nicely over the course but still has pneumonia ongoing. However,  her MRI showed one punctate right periventricular infarct. Neuro exam showed no focal neurological deficit. So far stroke work up negative with CUS and 2D echo but A1C pending but expected to be high.   Stroke:  Punctate right coronal radata periventricular infarct - most likely etiology is hypotension due to cardiac arrest in the setting of small vessel atherosclerotic changes secondary to uncontrolled DM.  MRI  As above  MRA  Not ordered. She has no neuro deficit and MRA will not change management, will do outpt TCD.   Carotid Doppler  unremarkable  2D Echo  unremarkable  LDL 47 on the goal  HgbA1c pending but expected to be high  Heparin for VTE prophylaxis  Carb Control diet  no antithrombotics prior to admission, now on aspirin 81 mg orally every day, recommend to continue ASA 81mg  on discharge.  Patient counseled to be compliant with her antithrombotic medications  Risk factor education  Ongoing aggressive risk factor management  Resultant no deficit  Hypertension  Home meds:   None  Stable  BP goal normotensive  Diabetes  HgbA1c pending, expected to be high as she was admitted for DKA  Uncontrolled, admitted for  DKA  Educated patient about lifestyle changes for diabetes treatment  On insulin  As per primary team  Other Stroke Risk Factors Medication noncompliance  Other Active Problems  Aspiration pneumonia  Cardiac arrest s/p resuscitation   Hospital day # 8  Neurology will sign off. Please call with questions. Pt will follow up with Dr. Erlinda Hong at [facility] in about 2 months. Thanks for the consult.  Rosalin Hawking, MD PhD Stroke Neurology 04/15/2014 2:48 PM   To contact Stroke Continuity provider, please refer to [email or web address] After hours, contact General Neurology

## 2014-04-15 NOTE — Progress Notes (Signed)
OT Cancellation Note  Patient Details Name: ADILENI BROOKER MRN: [id number] DOB: [birth year]   Cancelled Treatment:    Reason Eval/Treat Not Completed: Patient at procedure or test/ unavailable  Benito Mccreedy OTR/L [id number] 04/15/2014, 12:06 PM

## 2014-04-15 NOTE — Progress Notes (Signed)
VASCULAR LAB PRELIMINARY  PRELIMINARY  PRELIMINARY  PRELIMINARY  Carotid duplex  completed.    Preliminary report:  Bilateral:  1-39% ICA stenosis.  Vertebral artery flow is antegrade.      Coralynn Gaona, RVT 04/15/2014, 11:20 AM

## 2014-04-15 NOTE — Progress Notes (Signed)
ANTIBIOTIC CONSULT NOTE - INITIAL  Pharmacy Consult for vancomycin and cefepime Indication: pneumonia HCA   No Known Allergies  Patient Measurements: Height: 5\' 1"  (154.9 cm) Weight: 108 lb 7.5 oz (49.2 kg) IBW/kg (Calculated) : 47.8 Adjusted Body Weight:   Vital Signs: Temp: 99.2 F (37.3 C) (10/26 0139) Temp Source: Oral (10/26 0139) BP: 120/77 mmHg (10/26 0139) Pulse Rate: 100 (10/26 0139) Intake/Output from previous day: 10/25 0701 - 10/26 0700 In: 1160 [P.O.:838; Blood:322] Out: 3250 [Urine:3250] Intake/Output from this shift: Total I/O In: 118 [P.O.:118] Out: 1350 [Urine:1350]  Labs:  Recent Labs  04/12/14 0715 04/12/14 1518 04/13/14 0620 04/13/14 1223 04/14/14 0458 04/14/14 0935  WBC 2.9*  --  3.6*  --  5.2  --   HGB 6.1*  --  7.6*  --  7.2* 6.8*  PLT 126*  --  PLATELET CLUMPS NOTED ON SMEAR, COUNT APPEARS ADEQUATE  --  300  --   CREATININE  --  1.12*  --  1.22* 1.03  --    Estimated Creatinine Clearance: 64.1 ml/min (by C-G formula based on Cr of 1.03). No results found for this basename: VANCOTROUGH, Corlis Leak, VANCORANDOM, GENTTROUGH, GENTPEAK, GENTRANDOM, TOBRATROUGH, TOBRAPEAK, TOBRARND, AMIKACINPEAK, AMIKACINTROU, AMIKACIN,  in the last 72 hours   Microbiology: Recent Results (from the past 720 hour(s))  MRSA PCR SCREENING     Status: None   Collection Time    04/07/14  6:32 PM      Result Value Ref Range Status   MRSA by PCR NEGATIVE  NEGATIVE Final   Comment:            The GeneXpert MRSA Assay (FDA     approved for NASAL specimens     only), is one component of a     comprehensive MRSA colonization     surveillance program. It is not     intended to diagnose MRSA     infection nor to guide or     monitor treatment for     MRSA infections.    Medical History: Past Medical History  Diagnosis Date  . Diabetes mellitus type 1     2005 dx [facility]    Medications:  Prescriptions prior to admission  Medication Sig Dispense Refill   . gabapentin (NEURONTIN) 100 MG capsule Take 1 capsule (100 mg total) by mouth at bedtime.  90 capsule  3  . ibuprofen (ADVIL,MOTRIN) 200 MG tablet Take 400 mg by mouth as needed for headache or cramping.       . insulin aspart (NOVOLOG) 100 UNIT/ML injection Inject 4 Units into the skin 3 (three) times daily before meals.      . insulin glargine (LANTUS) 100 UNIT/ML injection Inject 17 Units into the skin at bedtime.       Assessment: 23 found unresponsive due to DKA and hyperkalemia CPR20 min . Now with low grade fever and HCA pna.  vanc and cefepime for empiric coverage.  Goal of Therapy:  Vancomycin trough level 15-20 mcg/ml  Plan:  Vancomycin 750mg  q12h  Cefepime 1 gm q8h  Curlene Dolphin 04/15/2014,1:40 AM

## 2014-04-15 NOTE — Progress Notes (Signed)
0000 Patient alert and oriented was getting ready to hang 2nd unit of PRBC and patient started c/o chest pain VSS.  Francee Nodal paged will wait until here from [facility] before starting blood. [id number] Francee Nodal return 2nd page and stated she would get Erin Hearing to come up and assess patient. Charlotte NP up to assess patient. EKG done and results given to NP. 0145 Blood started and will continue to monitor.

## 2014-04-15 NOTE — Evaluation (Addendum)
Occupational Therapy Evaluation Patient Details Name: [name] MRN: [id number] DOB: 27-Nov-1990 Today's Date: 04/15/2014    History of Present Illness 17F admitted with very severe DKA and s/p prolonged cardiac arrest (reportedly > 20 mins CPR) due to hyperkalemia.  On 10/24 MRI found pt to have R Corona Radiata Infarct.     Clinical Impression   Pt admitted with above. Pt independent with ADLs, PTA. Feel pt will benefit from acute OT to increase strength, balance, and independence.     Follow Up Recommendations  No OT follow up;Supervision/Assistance - 24 hour    Equipment Recommendations  3 in 1 bedside comode    Recommendations for Other Services       Precautions / Restrictions Precautions Precautions: Fall Restrictions Weight Bearing Restrictions: No      Mobility Bed Mobility Overal bed mobility: Modified Independent              Transfers Overall transfer level: Needs assistance Equipment used: None Transfers: Sit to/from Stand Sit to Stand: Supervision         General transfer comment: supervision for safety         ADL Overall ADL's : Needs assistance/impaired     Grooming: Oral care;Set up;Supervision/safety;Standing   Upper Body Bathing: Set up;Supervision/ safety;Standing   Lower Body Bathing: Set up;Supervison/ safety;Sit to/from stand   Upper Body Dressing : Set up;Supervision/safety;Standing   Lower Body Dressing: Set up;Supervision/safety;Sit to/from stand   Toilet Transfer: Min guard;Ambulation (chair)       Tub/ Shower Transfer: Min guard;Ambulation (practiced stepping over)   Functional mobility during ADLs: Min guard;Minimal assistance (LOB in hallway) General ADL Comments: Recommended sitting for most of LB ADLs. Educated on BE FAST stroke education. Educated on tub transfer technique and pt practiced. educated on exercises for UE's to increase strength. Talked about energy conservation.  Discussed safe  shoewear. Recommended avoiding hot surfaces and dangerous objects due to decreased sensation in Rt hand.     Vision    Pt reports blurry vision in right eye.   Wears reading glasses  Visual fields: No apparent deficits  Visual tracking: [facility]                 Perception     Praxis      Pertinent Vitals/Pain Pain Assessment: Faces Faces Pain Scale: Hurts a little bit Pain Location: left chest  Pain Descriptors / Indicators: Sore Pain Intervention(s): Monitored during session     Hand Dominance     Extremity/Trunk Assessment Upper Extremity Assessment Upper Extremity Assessment: Generalized weakness;RUE deficits/detail RUE Sensation: decreased light touch   Lower Extremity Assessment Lower Extremity Assessment: Defer to PT evaluation       Communication Communication Communication: No difficulties (very soft spoken)   Cognition Arousal/Alertness: Awake/alert Behavior During Therapy: Flat affect Overall Cognitive Status: Within Functional Limits for tasks assessed                     General Comments       Exercises       Shoulder Instructions      Home Living Family/patient expects to be discharged to:: Private residence Living Arrangements: Non-relatives/Friends Available Help at Discharge: Family;Available 24 hours/day (for limited time) Type of Home: Apartment Home Access: Level entry     Home Layout: One level     Bathroom Shower/Tub: Teacher, early years/pre: Standard     Home Equipment: None   Additional Comments: Pt lives in an  apt with roommates first floor, parents house is single story with 2-3 steps      Prior Functioning/Environment Level of Independence: Independent             OT Diagnosis: Generalized weakness;Acute pain   OT Problem List: Decreased strength;Decreased activity tolerance;Impaired balance (sitting and/or standing);Decreased knowledge of use of DME or AE;Decreased knowledge of  precautions;Pain   OT Treatment/Interventions: Self-care/ADL training;Therapeutic exercise;DME and/or AE instruction;Therapeutic activities;Patient/family education;Balance training    OT Goals(Current goals can be found in the care plan section) Acute Rehab OT Goals Patient Stated Goal: not stated OT Goal Formulation: With patient Time For Goal Achievement: 04/22/14 Potential to Achieve Goals: Good ADL Goals Pt Will Perform Lower Body Bathing: with modified independence;sit to/from stand Pt Will Transfer to Toilet: with modified independence;ambulating Additional ADL Goal #1: Pt will independently perform HEP for bilateral UE's to increase strength.  OT Frequency: Min 2X/week   Barriers to D/C:            Co-evaluation              End of Session Equipment Utilized During Treatment: Gait belt  Activity Tolerance: Patient tolerated treatment well Patient left: in bed;with call bell/phone within reach   Time: [id number] OT Time Calculation (min): 31 min Charges:  OT General Charges $OT Visit: 1 Procedure OT Evaluation $Initial OT Evaluation Tier I: 1 Procedure OT Treatments $Self Care/Home Management : 8-22 mins G-CodesBenito Mccreedy OTR/L [id number] 04/15/2014, 6:03 PM

## 2014-04-16 DIAGNOSIS — J9690 Respiratory failure, unspecified, unspecified whether with hypoxia or hypercapnia: Secondary | ICD-10-CM

## 2014-04-16 LAB — TYPE AND SCREEN
ABO/RH(D): B POS
Antibody Screen: NEGATIVE
UNIT DIVISION: 0
Unit division: 0

## 2014-04-16 LAB — GLUCOSE, CAPILLARY
GLUCOSE-CAPILLARY: 61 mg/dL — AB (ref 70–99)
GLUCOSE-CAPILLARY: 69 mg/dL — AB (ref 70–99)
Glucose-Capillary: 98 mg/dL (ref 70–99)
Glucose-Capillary: 154 mg/dL — ABNORMAL HIGH (ref 70–99)
Glucose-Capillary: 94 mg/dL (ref 70–99)

## 2014-04-16 MED ORDER — INSULIN GLARGINE 100 UNIT/ML ~~LOC~~ SOLN
25.0000 [IU] | Freq: Every day | SUBCUTANEOUS | Status: DC
Start: 2014-04-16 — End: 2014-04-23

## 2014-04-16 MED ORDER — INSULIN ASPART 100 UNIT/ML ~~LOC~~ SOLN: SUBCUTANEOUS | Status: DC

## 2014-04-16 MED ORDER — FERROUS SULFATE 300 (60 FE) MG/5ML PO SYRP: 300.0000 mg | ORAL_SOLUTION | Freq: Three times a day (TID) | ORAL | Status: DC

## 2014-04-16 MED ORDER — AMOXICILLIN-POT CLAVULANATE 875-125 MG PO TABS: 1.0000 | ORAL_TABLET | Freq: Two times a day (BID) | ORAL | Status: DC

## 2014-04-16 MED ORDER — ASPIRIN 81 MG PO CHEW: 81.0000 mg | CHEWABLE_TABLET | Freq: Every day | ORAL | Status: DC

## 2014-04-16 MED ORDER — INSULIN GLARGINE 100 UNIT/ML ~~LOC~~ SOLN: 30.0000 [IU] | Freq: Every day | SUBCUTANEOUS | Status: DC

## 2014-04-16 MED ORDER — DEXTROSE 50 % IV SOLN
25.0000 mL | Freq: Once | INTRAVENOUS | Status: AC | PRN
  Administered 2014-04-16: 25 mL via INTRAVENOUS

## 2014-04-16 MED ORDER — DEXTROSE 50 % IV SOLN
INTRAVENOUS | Status: AC
  Administered 2014-04-16: 12:00:00
  Filled 2014-04-16: qty 50

## 2014-04-16 NOTE — Progress Notes (Signed)
Instructed patient to remain supine for 30 min. after left subclavian central line removed to avoid complications. Instructed patient that pressure dressing to remain over insertion site for 24 hrs. Site clean, dry and intact. Patient tolerated procedure well.Tasia Catchings RN [facility]

## 2014-04-16 NOTE — Progress Notes (Signed)
Discharge instructions and prescriptions given and explained to pt. And pt's mother.  Both verbalized understanding of all orders/instructions.  Pt ordered to check blood sugars before meals and at bedtime.  MD also instructed pt to recheck blood sugar when she gets home.  IJ central line removed by IV team and pt was still for 30 min after.  3in1 commode delivered to pt's room.  VSS. Blood sugar stable.  Pt in no s/s of distress and discharged to home with mother. Syliva Overman

## 2014-04-16 NOTE — Discharge Summary (Addendum)
[name], is a 23 y.o. female  DOB November 27, 1990  MRN ID:[id number].  Admission date:  04/07/2014  Admitting Physician  Wilhelmina Mcardle, MD  Discharge Date:  04/16/2014   Primary MD  Lorayne Marek, MD  Recommendations for primary care physician for things to follow:   Check CBC, BMP and a 2 view chest x-ray in 7-10 days.   Admission Diagnosis  Cardiac arrest [I46.9] Respiratory failure [J96.90] Diabetic ketoacidosis with coma associated with type 1 diabetes mellitus [E10.11]   Discharge Diagnosis  Cardiac arrest [I46.9] Respiratory failure [J96.90] Diabetic ketoacidosis with coma associated with type 1 diabetes mellitus [E10.11]    Active Problems:   DKA (diabetic ketoacidoses)   Noncompliance   DKA, type 1   Diabetic neuropathy   Cardiac arrest      Past Medical History  Diagnosis Date  . Diabetes mellitus type 1     2005 dx [facility]    Past Surgical History  Procedure Laterality Date  . Cataract extraction      6 years ago       History of present illness and  Hospital Course:     Kindly see H&P for history of present illness and admission details, please review complete Labs, Consult reports and Test reports for all details in brief  HPI     This is a 23 year old African American female with type 1 diabetes mellitus, noncompliant with medications who was admitted by critical care one week ago after hyperkalemia induced cardiac arrest requiring CPR and intubation, she was also in DKA at that time. She was treated supportively, underwent 2-D echogram which was stable with the EF of 55-60%, MRI brain showing an infarct likely due to ischemic injury during cardiac arrest, she also was found to be anemic received 2 units of packed RBC on 04/12/2014. On 04/14/2014 she was transferred out of the  critical care to hospitalist service. I assumed her care on 04/14/2014. Today she is anemic, requiring 2 more units of packed RBC along with anemia workup.    Hospital Course    1. Hyperkalemia induced cardiac arrest. Stable, echo noted with EF of 55-60% and no wall motion abnormality. No acute issues.    2. DKA in a type I diabetic with history of noncompliance. Counseled on compliance with insulin, adjusted Lantus and sliding scale, requested to do Accu-Cheks every before meals at bedtime maintain a logbook ensure to PCP next visit.  Lab Results  Component Value Date   HGBA1C 9.2* 04/15/2014     3. Diabetic neuropathy. On Neurontin continue.    4. Microcytic anemia. Likely due to menstrual blood loss, received 2 units of packed RBC by critical care on 04/12/2014, had 2 more units on 10-25. H&H stable now. Anemia panel suggestive of iron deficiency, placed on iron supplementation orally. Monitor levels outpatient.   5. Infarct noted on MRI done by critical care on 04/13/2014. No focal deficits, this likely was due to cardiac arrest causing ischemia. Neuro following. Echogram stable, HDL less than  70, A1c is high as expected due to noncompliance with diabetic medications, patient educated and counseled. Carotid Dopplers stable, aspirin 81 per neuro. team by PTOT, no speech eval per neurology. Clinically no focal deficits at this time along with no speech or swallowing issues. Discussed with neurologist Dr. Erlinda Hong, patient will follow with him in the office, may require transcranial Dopplers outpatient for full workup.   6.Milia on the chest-back - stable, outpatient dermatology follow-up on time.   7.HCAP - likely aspirated in the ER when she coded, no swallowing problems or speech problems whatsoever, we'll place on Augmentin for 5 more days. Repeat 2 view chest x-ray along with CBC BMP by PCP in a week.    Discharge Condition: stable   Follow UP  Follow-up Information   Follow  up with [facility]     On 04/17/2014. (10:45 hospital follow up)    Contact information:   [place] [id number] [phone]      Follow up with [facility]. Schedule an appointment as soon as possible for a visit in 1 week.   Contact information:   [address]     [place] [id number] [phone]      Follow up with [facility]. Schedule an appointment as soon as possible for a visit in 1 week. (Or any other dermatologist of choice)    Specialty:  Dermatology   Contact information:   [place] [address] [phone]        Discharge Instructions  and  Discharge Medications         Discharge Instructions   Ambulatory referral to Neurology    Complete by:  As directed   Pt will follow up with Dr. Erlinda Hong at [facility] in about 2 months. Thanks     Discharge instructions    Complete by:  As directed   Follow with Primary MD Lorayne Marek, MD in 7 days   Get CBC, CMP, 2 view Chest X ray checked  by Primary MD next visit.    Activity: As tolerated with Full fall precautions use walker/cane & assistance as needed   Disposition Home     Diet: Heart Healthy Low Carb with feeding assistance and aspiration precautions as needed.  Accuchecks 4 times/day, Once in AM empty stomach and then before each meal. Log in all results and show them to your Prim.MD in 3 days. If any glucose reading is under 80 or above 300 call your Prim MD immidiately. Follow Low glucose instructions for glucose under 80 as instructed.    For Heart failure patients - Check your Weight same time everyday, if you gain over 2 pounds, or you develop in leg swelling, experience more shortness of breath or chest pain, call your Primary MD immediately. Follow Cardiac Low Salt Diet and 1.8 lit/day fluid restriction.   On your next visit with your primary care physician please Get Medicines  reviewed and adjusted.   Please request your Prim.MD to go over all Hospital Tests and Procedure/Radiological results at the follow up, please get all Hospital records sent to your Prim MD by signing hospital release before you go home.   If you experience worsening of your admission symptoms, develop shortness of breath, life threatening emergency, suicidal or homicidal thoughts you must seek medical attention immediately by calling 911 or calling your MD immediately  if symptoms less severe.  You Must read complete instructions/literature along with  all the possible adverse reactions/side effects for all the Medicines you take and that have been prescribed to you. Take any new Medicines after you have completely understood and accpet all the possible adverse reactions/side effects.   Do not drive, operating heavy machinery, perform activities at heights, swimming or participation in water activities or provide baby sitting services if your were admitted for syncope or siezures until you have seen by Primary MD or a Neurologist and advised to do so again.  Do not drive when taking Pain medications.    Do not take more than prescribed Pain, Sleep and Anxiety Medications  Special Instructions: If you have smoked or chewed Tobacco  in the last 2 yrs please stop smoking, stop any regular Alcohol  and or any Recreational drug use.  Wear Seat belts while driving.   Please note  You were cared for by a hospitalist during your hospital stay. If you have any questions about your discharge medications or the care you received while you were in the hospital after you are discharged, you can call the unit and asked to speak with the hospitalist on call if the hospitalist that took care of you is not available. Once you are discharged, your primary care physician will handle any further medical issues. Please note that NO REFILLS for any discharge medications will be authorized once you are discharged,  as it is imperative that you return to your primary care physician (or establish a relationship with a primary care physician if you do not have one) for your aftercare needs so that they can reassess your need for medications and monitor your lab values.     Increase activity slowly    Complete by:  As directed             Medication List         amoxicillin-clavulanate 875-125 MG per tablet  Commonly known as:  AUGMENTIN  Take 1 tablet by mouth every 12 (twelve) hours.     aspirin 81 MG chewable tablet  Chew 1 tablet (81 mg total) by mouth daily.     ferrous sulfate 300 (60 FE) MG/5ML syrup  Take 5 mLs (300 mg total) by mouth 3 (three) times daily with meals.     gabapentin 100 MG capsule  Commonly known as:  NEURONTIN  Take 1 capsule (100 mg total) by mouth at bedtime.     ibuprofen 200 MG tablet  Commonly known as:  ADVIL,MOTRIN  Take 400 mg by mouth as needed for headache or cramping.     insulin aspart 100 UNIT/ML injection  Commonly known as:  novoLOG  - Before each meal 3 times a day, 140-199 - 2 units, 200-250 - 4 units, 251-299 - 6 units,  300-349 - 8 units,  350 or above 10 units.  - Insulin PEN if approved, provide syringes and needles if needed.     insulin glargine 100 UNIT/ML injection  Commonly known as:  LANTUS  Inject 0.25 mLs (25 Units total) into the skin at bedtime.          Diet and Activity recommendation: See Discharge Instructions above   Consults obtained - critical care, neurology   Major procedures and Radiology Reports - PLEASE review detailed and final reports for all details, in brief -       Dg Chest 2 View  04/14/2014   CLINICAL DATA:  23 year old female with mid chest pain for the past 7 days. History of hyperkalemia induced cardiac  arrest 2 weeks ago requiring CPR.  EXAM: CHEST  2 VIEW  COMPARISON:  Chest x-ray 04/12/2014.  FINDINGS: Left-sided subclavian central venous catheter with tip terminating in the mid superior vena  cava. New airspace consolidation in the right lower lobe concerning for pneumonia. Small bilateral pleural effusions. Linear opacities of the left lung base favored to reflect atelectasis, although additional airspace consolidation is not excluded. No evidence of pulmonary edema. Heart size is normal. Upper mediastinal contours are within normal limits.  IMPRESSION: 1. Basilar right lower lobe airspace consolidation is new compared to the prior study, concerning for pneumonia, with likely with small parapneumonic pleural effusion. 2. Probable subsegmental atelectasis in the left lung base is unchanged. Small left pleural effusion is also unchanged.   Electronically Signed   By: Vinnie Langton M.D.   On: 04/14/2014 12:25   Mr Brain Wo Contrast  04/13/2014   CLINICAL DATA:  Diabetic ketoacidosis with cardiac arrest and prolonged resuscitation. The patient has now recovered from the acute event but has persistent right-sided weakness.  EXAM: MRI HEAD WITHOUT CONTRAST  TECHNIQUE: Multiplanar, multiecho pulse sequences of the brain and surrounding structures were obtained without intravenous contrast.  COMPARISON:  None.  FINDINGS: The diffusion-weighted images demonstrate a punctate area of restricted diffusion in the right coronal radiata adjacent to the lateral ventricle. No significant left-sided disease is evident. Focal T2 changes are associated with the area of acute infarction. No other significant white matter disease is present. There is no significant white matter change on the left.  Flow is present in the major intracranial arteries. The globes and orbits are intact.  The left maxillary sinus is opacified. Thick mucosal thickening is noted along the inferior right maxillary sinus. The anterior ethmoid air cells are opacified bilaterally right greater than left. The right frontal sinus is opacified. There is restricted diffusion within the areas of signs opacification suggesting infection.  IMPRESSION:  1. Punctate area of acute nonhemorrhagic infarct involving the right coronal radiata adjacent to the lateral ventricle. 2. No significant left-sided disease. 3. Left maxillary, bilateral anterior ethmoid, and right frontal sinusitis with restricted diffusion consistent with acute infection.   Electronically Signed   By: Lawrence Santiago M.D.   On: 04/13/2014 19:23     Dg Ankle Right Port  04/09/2014   CLINICAL DATA:  Pain and swelling involving lateral malleolus of right ankle after fall 2 days ago.  EXAM: PORTABLE RIGHT ANKLE - 2 VIEW  COMPARISON:  None.  FINDINGS: No acute fracture or dislocation is identified. The ankle mortise shows normal alignment. Mild soft tissue swelling present. No significant arthropathy. No bony lesions or destruction.  IMPRESSION: No acute fracture.   Electronically Signed   By: Aletta Edouard M.D.   On: 04/09/2014 20:21    Micro Results      Recent Results (from the past 240 hour(s))  MRSA PCR SCREENING     Status: None   Collection Time    04/07/14  6:32 PM      Result Value Ref Range Status   MRSA by PCR NEGATIVE  NEGATIVE Final   Comment:            The GeneXpert MRSA Assay (FDA     approved for NASAL specimens     only), is one component of a     comprehensive MRSA colonization     surveillance program. It is not     intended to diagnose MRSA     infection nor to guide  or     monitor treatment for     MRSA infections.       Today   Subjective:   Lurline Kneisel today has no headache,no chest abdominal pain,no new weakness tingling or numbness, feels much better wants to go home today.    Objective:   Blood pressure 108/70, pulse 89, temperature 98.1 F (36.7 C), temperature source Oral, resp. rate 20, height 5\' 1"  (1.549 m), weight 49.2 kg (108 lb 7.5 oz), last menstrual period 03/10/2014, SpO2 98.00%.   Intake/Output Summary (Last 24 hours) at 04/16/14 1241 Last data filed at 04/16/14 0910  Gross per 24 hour  Intake    360 ml    Output   1550 ml  Net  -1190 ml    Exam Awake Alert, Oriented x 3, No new F.N deficits, Normal affect [place].AT,PERRAL Supple Neck,No JVD, No cervical lymphadenopathy appriciated.  Symmetrical Chest wall movement, Good air movement bilaterally, CTAB RRR,No Gallops,Rubs or new Murmurs, No Parasternal Heave +ve B.Sounds, Abd Soft, Non tender, No organomegaly appriciated, No rebound -guarding or rigidity. No Cyanosis, Clubbing or edema, No new Rash or bruise, milia like rash all over her trunk and back  Data Review   CBC w Diff: Lab Results  Component Value Date   WBC 5.2 04/14/2014   HGB 9.9* 04/15/2014   HCT 28.9* 04/15/2014   PLT 300 04/14/2014   LYMPHOPCT 34 04/07/2014   MONOPCT 4 04/07/2014   EOSPCT 0 04/07/2014   BASOPCT 0 04/07/2014    CMP: Lab Results  Component Value Date   NA 139 04/14/2014   K 4.1 04/14/2014   CL 105 04/14/2014   CO2 24 04/14/2014   BUN 12 04/14/2014   CREATININE 1.03 04/14/2014   CREATININE 1.15* 03/06/2014   PROT 5.7* 04/07/2014   ALBUMIN 2.3* 04/07/2014   BILITOT <0.2* 04/07/2014   ALKPHOS 113 04/07/2014   AST 337* 04/07/2014   ALT 198* 04/07/2014  .   Total Time in preparing paper work, data evaluation and todays exam - 35 minutes  Thurnell Lose M.D on 04/16/2014 at 12:41 PM  [facility] Office  [phone]

## 2014-04-16 NOTE — Discharge Instructions (Signed)
Follow with Primary MD Lorayne Marek, MD in 7 days   Get CBC, CMP, 2 view Chest X ray checked  by Primary MD next visit.    Activity: As tolerated with Full fall precautions use walker/cane & assistance as needed   Disposition Home     Diet: Heart Healthy Low Carb with feeding assistance and aspiration precautions as needed.  Accuchecks 4 times/day, Once in AM empty stomach and then before each meal. Log in all results and show them to your Prim.MD in 3 days. If any glucose reading is under 80 or above 300 call your Prim MD immidiately. Follow Low glucose instructions for glucose under 80 as instructed.    For Heart failure patients - Check your Weight same time everyday, if you gain over 2 pounds, or you develop in leg swelling, experience more shortness of breath or chest pain, call your Primary MD immediately. Follow Cardiac Low Salt Diet and 1.8 lit/day fluid restriction.   On your next visit with your primary care physician please Get Medicines reviewed and adjusted.   Please request your Prim.MD to go over all Hospital Tests and Procedure/Radiological results at the follow up, please get all Hospital records sent to your Prim MD by signing hospital release before you go home.   If you experience worsening of your admission symptoms, develop shortness of breath, life threatening emergency, suicidal or homicidal thoughts you must seek medical attention immediately by calling 911 or calling your MD immediately  if symptoms less severe.  You Must read complete instructions/literature along with all the possible adverse reactions/side effects for all the Medicines you take and that have been prescribed to you. Take any new Medicines after you have completely understood and accpet all the possible adverse reactions/side effects.   Do not drive, operating heavy machinery, perform activities at heights, swimming or participation in water activities or provide baby sitting services if  your were admitted for syncope or siezures until you have seen by Primary MD or a Neurologist and advised to do so again.  Do not drive when taking Pain medications.    Do not take more than prescribed Pain, Sleep and Anxiety Medications  Special Instructions: If you have smoked or chewed Tobacco  in the last 2 yrs please stop smoking, stop any regular Alcohol  and or any Recreational drug use.  Wear Seat belts while driving.   Please note  You were cared for by a hospitalist during your hospital stay. If you have any questions about your discharge medications or the care you received while you were in the hospital after you are discharged, you can call the unit and asked to speak with the hospitalist on call if the hospitalist that took care of you is not available. Once you are discharged, your primary care physician will handle any further medical issues. Please note that NO REFILLS for any discharge medications will be authorized once you are discharged, as it is imperative that you return to your primary care physician (or establish a relationship with a primary care physician if you do not have one) for your aftercare needs so that they can reassess your need for medications and monitor your lab values.

## 2014-04-16 NOTE — Progress Notes (Signed)
Hypoglycemic Event  CBG: 61  Treatment: D50 IV 25 mL  Symptoms: None  Follow-up CBG: Time:1230 CBG Result:154  Possible Reasons for Event: Unknown  Comments/MD notified:will notify MD of hypoglycemic events.    Oren Bracket L  Remember to initiate Hypoglycemia Order Set & complete

## 2014-04-16 NOTE — Progress Notes (Signed)
Hypoglycemic Event  CBG: 69  Treatment: 15 GM carbohydrate snack  Symptoms: None  Follow-up CBG: Time:1205 CBG Result:61  Possible Reasons for Event: Unknown  Comments/MD notified:d50 will be given.    Oren Bracket L  Remember to initiate Hypoglycemia Order Set & complete

## 2014-04-17 ENCOUNTER — Ambulatory Visit: Payer: Medicaid Other | Attending: Internal Medicine | Admitting: Internal Medicine

## 2014-04-17 ENCOUNTER — Encounter: Payer: Self-pay | Admitting: Internal Medicine

## 2014-04-17 VITALS — BP 116/82 | HR 99 | Temp 98.2°F | Resp 16 | Wt 118.8 lb

## 2014-04-17 DIAGNOSIS — Z794 Long term (current) use of insulin: Secondary | ICD-10-CM | POA: Diagnosis not present

## 2014-04-17 DIAGNOSIS — I469 Cardiac arrest, cause unspecified: Secondary | ICD-10-CM

## 2014-04-17 DIAGNOSIS — D649 Anemia, unspecified: Secondary | ICD-10-CM | POA: Insufficient documentation

## 2014-04-17 DIAGNOSIS — Z7982 Long term (current) use of aspirin: Secondary | ICD-10-CM | POA: Insufficient documentation

## 2014-04-17 DIAGNOSIS — Z8701 Personal history of pneumonia (recurrent): Secondary | ICD-10-CM

## 2014-04-17 DIAGNOSIS — N179 Acute kidney failure, unspecified: Secondary | ICD-10-CM | POA: Diagnosis not present

## 2014-04-17 DIAGNOSIS — Z8674 Personal history of sudden cardiac arrest: Secondary | ICD-10-CM | POA: Diagnosis not present

## 2014-04-17 DIAGNOSIS — N189 Chronic kidney disease, unspecified: Secondary | ICD-10-CM

## 2014-04-17 DIAGNOSIS — E1010 Type 1 diabetes mellitus with ketoacidosis without coma: Secondary | ICD-10-CM | POA: Diagnosis not present

## 2014-04-17 DIAGNOSIS — E139 Other specified diabetes mellitus without complications: Secondary | ICD-10-CM | POA: Diagnosis not present

## 2014-04-17 DIAGNOSIS — Z862 Personal history of diseases of the blood and blood-forming organs and certain disorders involving the immune mechanism: Secondary | ICD-10-CM

## 2014-04-17 LAB — GLUCOSE, POCT (MANUAL RESULT ENTRY): POC Glucose: 215 mg/dl — AB (ref 70–99)

## 2014-04-17 NOTE — Progress Notes (Signed)
MRN: [id number] Name: [name]  Sex: female Age: 23 y.o. DOB: 12/10/1990  Allergies: Review of patient's allergies indicates no known allergies.  Chief Complaint  Patient presents with  . Hospitalization Follow-up    HPI: Patient is 23 y.o. female who Comes today for followup, she has history of type 1 diabetes recently hospitalized and admitted in ICU status post cardiac arrest  likely secondary to hyperkalemia, EMR reviewed her patient required CPR and intubation, she also was in DKA, she had echocardiogram done which reported EF of 55-60% she had MRI of brain done which showed her an infarct likely due to ischemic injury secondary to cardiac arrest, she was also anemic and received 4 units of packed RBCs, she subsequently improved and extubated , her diabetes medications were adjusted. Patient's x-ray also noted to have infiltrates, likely atelectasis or pneumonia she was started on Augmentin and was advised to repeat chest x-ray CBC BMP in one week time, patient is also advised to follow with neurology. Patient denies any fever chills chest pain shortness of breath occasionally has cough takes cough medication when necessary.  Past Medical History  Diagnosis Date  . Diabetes mellitus type 1     2005 dx [facility]    Past Surgical History  Procedure Laterality Date  . Cataract extraction      6 years ago      Medication List       This list is accurate as of: 04/17/14 11:47 AM.  Always use your most recent med list.               amoxicillin-clavulanate 875-125 MG per tablet  Commonly known as:  AUGMENTIN  Take 1 tablet by mouth every 12 (twelve) hours.     aspirin 81 MG chewable tablet  Chew 1 tablet (81 mg total) by mouth daily.     ferrous sulfate 300 (60 FE) MG/5ML syrup  Take 5 mLs (300 mg total) by mouth 3 (three) times daily with meals.     gabapentin 100 MG capsule  Commonly known as:  NEURONTIN  Take 1 capsule (100 mg total) by mouth at  bedtime.     ibuprofen 200 MG tablet  Commonly known as:  ADVIL,MOTRIN  Take 400 mg by mouth as needed for headache or cramping.     insulin aspart 100 UNIT/ML injection  Commonly known as:  novoLOG  - Before each meal 3 times a day, 140-199 - 2 units, 200-250 - 4 units, 251-299 - 6 units,  300-349 - 8 units,  350 or above 10 units.  - Insulin PEN if approved, provide syringes and needles if needed.     insulin glargine 100 UNIT/ML injection  Commonly known as:  LANTUS  Inject 0.25 mLs (25 Units total) into the skin at bedtime.        No orders of the defined types were placed in this encounter.    Immunization History  Administered Date(s) Administered  . Influenza,inj,Quad PF,36+ Mos 04/11/2014    Family History  Problem Relation Age of Onset  . Diabetes Maternal Grandmother   . Stroke Maternal Grandmother   . Cancer      aunt, passed last month, ?RCC    History  Substance Use Topics  . Smoking status: Never Smoker   . Smokeless tobacco: Not on file  . Alcohol Use: Yes     Comment: occasional    Review of Systems   As noted in HPI  Filed Vitals:  04/17/14 1102  BP: 116/82  Pulse: 99  Temp: 98.2 F (36.8 C)  Resp: 16    Physical Exam  Physical Exam  Constitutional: No distress.  Eyes: EOM are normal. Pupils are equal, round, and reactive to light.  Cardiovascular: Normal rate and regular rhythm.   Pulmonary/Chest: Breath sounds normal. No respiratory distress. She has no wheezes. She has no rales.  Musculoskeletal: She exhibits no edema.    CBC    Component Value Date/Time   WBC 5.2 04/14/2014 0458   RBC 2.48* 04/14/2014 0935   RBC 2.69* 04/14/2014 0458   HGB 9.9* 04/15/2014 0540   HCT 28.9* 04/15/2014 0540   PLT 300 04/14/2014 0458   MCV 78.1 04/14/2014 0458   LYMPHSABS 4.0 04/07/2014 1436   MONOABS 0.5 04/07/2014 1436   EOSABS 0.0 04/07/2014 1436   BASOSABS 0.0 04/07/2014 1436    CMP     Component Value Date/Time   NA 139  04/14/2014 0458   K 4.1 04/14/2014 0458   CL 105 04/14/2014 0458   CO2 24 04/14/2014 0458   GLUCOSE 262* 04/14/2014 0458   BUN 12 04/14/2014 0458   CREATININE 1.03 04/14/2014 0458   CREATININE 1.15* 03/06/2014 1125   CALCIUM 8.6 04/14/2014 0458   PROT 5.7* 04/07/2014 1436   ALBUMIN 2.3* 04/07/2014 1436   AST 337* 04/07/2014 1436   ALT 198* 04/07/2014 1436   ALKPHOS 113 04/07/2014 1436   BILITOT <0.2* 04/07/2014 1436   GFRNONAA 76* 04/14/2014 0458   GFRNONAA 68 03/06/2014 1125   GFRAA 88* 04/14/2014 0458   GFRAA 78 03/06/2014 1125    Lab Results  Component Value Date/Time   CHOL 139 04/15/2014  5:40 AM    No components found with this basename: hga1c    Lab Results  Component Value Date/Time   AST 337* 04/07/2014  2:36 PM    Assessment and Plan  Other specified diabetes mellitus without complications/Type 1 diabetes mellitus with ketoacidosis and without coma - Plan:  Results for orders placed in visit on 04/17/14  GLUCOSE, POCT (MANUAL RESULT ENTRY)      Result Value Ref Range   POC Glucose 215 (*) 70 - 99 mg/dl   recently treated for DKA, her last hemoglobin A1c was 9.2%.  Patient is counseled for diabetes meal planning as well as compliance with medications, she will continue with her new dosage of Lantus 25 units as well as NovoLog sliding scale COMPLETE METABOLIC PANEL WITH GFR  Status post Cardiac arrest secondary to hyperkalemia.  Patient denies any acute symptoms, her last echo showed EF of 50-60%. Will repeat her blood chemistry   Acute on chronic renal failure - Plan: Will repeat COMPLETE METABOLIC PANEL WITH GFR,  History of anemia - Plan: Patient is status post blood transfusion, will check CBC with Differential  History of pneumonia - Plan: Currently on Augmentin we'll check DG Chest 2 View which patient will do next week, CBC with Differential  Patient will call and schedule appointment with the neurologist  Health Maintenance Up-to-date with flu  shot.   Return in about 2 months (around 06/17/2014) for diabetes, anemia.  Lorayne Marek, MD

## 2014-04-17 NOTE — Progress Notes (Signed)
Patient here for hospital follow up Mom states her blood sugar was very high Mom states patient had fainted  On way to the hospital patient went  Into cardiac arrest Was hospitalized for about a week and a half

## 2014-04-23 ENCOUNTER — Other Ambulatory Visit: Payer: Self-pay

## 2014-04-23 MED ORDER — INSULIN GLARGINE 100 UNIT/ML ~~LOC~~ SOLN: 25.0000 [IU] | Freq: Every day | SUBCUTANEOUS | Status: DC

## 2014-04-24 ENCOUNTER — Ambulatory Visit (INDEPENDENT_AMBULATORY_CARE_PROVIDER_SITE_OTHER): Payer: Self-pay | Admitting: Neurology

## 2014-04-24 ENCOUNTER — Encounter: Payer: Self-pay | Admitting: Neurology

## 2014-04-24 ENCOUNTER — Ambulatory Visit (HOSPITAL_COMMUNITY)
Admission: RE | Admit: 2014-04-24 | Discharge: 2014-04-24 | Disposition: A | Discharge status: Home / Self Care | Payer: Medicaid Other | Source: Ambulatory Visit | Attending: Internal Medicine | Admitting: Internal Medicine

## 2014-04-24 ENCOUNTER — Ambulatory Visit: Payer: Self-pay | Attending: Internal Medicine

## 2014-04-24 ENCOUNTER — Ambulatory Visit: Payer: Self-pay

## 2014-04-24 VITALS — BP 93/62 | HR 99 | Ht 63.5 in | Wt 104.4 lb

## 2014-04-24 DIAGNOSIS — E139 Other specified diabetes mellitus without complications: Secondary | ICD-10-CM

## 2014-04-24 DIAGNOSIS — J189 Pneumonia, unspecified organism: Secondary | ICD-10-CM | POA: Insufficient documentation

## 2014-04-24 DIAGNOSIS — E1049 Type 1 diabetes mellitus with other diabetic neurological complication: Secondary | ICD-10-CM

## 2014-04-24 DIAGNOSIS — Z8701 Personal history of pneumonia (recurrent): Secondary | ICD-10-CM

## 2014-04-24 DIAGNOSIS — I63311 Cerebral infarction due to thrombosis of right middle cerebral artery: Secondary | ICD-10-CM | POA: Insufficient documentation

## 2014-04-24 DIAGNOSIS — E089 Diabetes mellitus due to underlying condition without complications: Secondary | ICD-10-CM | POA: Insufficient documentation

## 2014-04-24 DIAGNOSIS — N189 Chronic kidney disease, unspecified: Secondary | ICD-10-CM

## 2014-04-24 DIAGNOSIS — N179 Acute kidney failure, unspecified: Secondary | ICD-10-CM

## 2014-04-24 DIAGNOSIS — I953 Hypotension of hemodialysis: Secondary | ICD-10-CM

## 2014-04-24 DIAGNOSIS — Z862 Personal history of diseases of the blood and blood-forming organs and certain disorders involving the immune mechanism: Secondary | ICD-10-CM

## 2014-04-24 HISTORY — DX: Hypotension of hemodialysis: I95.3

## 2014-04-24 LAB — CBC WITH DIFFERENTIAL/PLATELET
BASOS ABS: 0 10*3/uL (ref 0.0–0.1)
BASOS PCT: 1 % (ref 0–1)
EOS ABS: 0.1 10*3/uL (ref 0.0–0.7)
Eosinophils Relative: 2 % (ref 0–5)
HEMATOCRIT: 34.8 % — AB (ref 36.0–46.0)
Hemoglobin: 10.8 g/dL — ABNORMAL LOW (ref 12.0–15.0)
Lymphocytes Relative: 34 % (ref 12–46)
Lymphs Abs: 1.4 10*3/uL (ref 0.7–4.0)
MCH: 27.1 pg (ref 26.0–34.0)
MCHC: 31 g/dL (ref 30.0–36.0)
MCV: 87.4 fL (ref 78.0–100.0)
MONO ABS: 0.2 10*3/uL (ref 0.1–1.0)
Monocytes Relative: 6 % (ref 3–12)
NEUTROS ABS: 2.3 10*3/uL (ref 1.7–7.7)
Neutrophils Relative %: 57 % (ref 43–77)
Platelets: 478 10*3/uL — ABNORMAL HIGH (ref 150–400)
RBC: 3.98 MIL/uL (ref 3.87–5.11)
RDW: 15.1 % (ref 11.5–15.5)
WBC: 4 10*3/uL (ref 4.0–10.5)

## 2014-04-24 LAB — COMPLETE METABOLIC PANEL WITH GFR
ALT: 16 U/L (ref 0–35)
AST: 15 U/L (ref 0–37)
Albumin: 3.2 g/dL — ABNORMAL LOW (ref 3.5–5.2)
Alkaline Phosphatase: 121 U/L — ABNORMAL HIGH (ref 39–117)
BILIRUBIN TOTAL: 0.2 mg/dL (ref 0.2–1.2)
BUN: 26 mg/dL — ABNORMAL HIGH (ref 6–23)
CO2: 26 mEq/L (ref 19–32)
CREATININE: 1.3 mg/dL — AB (ref 0.50–1.10)
Calcium: 9.6 mg/dL (ref 8.4–10.5)
Chloride: 99 mEq/L (ref 96–112)
GFR, Est African American: 67 mL/min
GFR, Est Non African American: 58 mL/min — ABNORMAL LOW
Glucose, Bld: 421 mg/dL — ABNORMAL HIGH (ref 70–99)
Potassium: 4.8 mEq/L (ref 3.5–5.3)
Sodium: 136 mEq/L (ref 135–145)
Total Protein: 6.5 g/dL (ref 6.0–8.3)

## 2014-04-24 MED ORDER — GABAPENTIN 100 MG PO CAPS: 100.0000 mg | ORAL_CAPSULE | Freq: Three times a day (TID) | ORAL | Status: DC

## 2014-04-24 NOTE — Progress Notes (Signed)
STROKE NEUROLOGY FOLLOW UP NOTE  NAME: [name] DOB: 22-Mar-1991  REASON FOR VISIT: stroke follow up HISTORY FROM: mom and chart  Today we had the pleasure of seeing [name] in follow-up at our Neurology Clinic. Pt was accompanied by mom.   History Summary [name] is an 23 y.o. female with a past medical history significant for type 1 diabetes mellitus, noncompliant with medications who was admitted by critical care on 04/07/14 after hyperkalemia induced cardiac arrest requiring CPR and intubation. Patient was also found to be on DKA at the time of admission. 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 She said that she is not aware of having stroke like symptoms before presenting to the hospital. Also, she denies HA, vertigo, double vision, difficulty swallowing, focal weakness, slurred speech , confusion, disorientation, language or vision impairment. Some numbness in her feet. Her stroke work up including carotid doppler and 2D echo were negative. A1C 9.2. Her stroke was thought due to low BP and low cerebral perfusion during the time of cardiac arrest. She was put on ASA 81mg  on discharge.  Interval History During the interval time, the patient has been doing fair. She still feel generalized weakness, thirsty and frequent urination. Her glucose at home between 150-200s but today it was 391 at home. Her cough is much better but she still complains of numbness and tingling at toes and fingertips.    REVIEW OF SYSTEMS: Full 14 system review of systems performed and notable only for those listed below and in HPI above, all others are negative:  Constitutional: weight loss, fatigue  Cardiovascular: N/A  Ear/Nose/Throat: N/A  Skin: N/A  Eyes: eye pain  Respiratory: cough  Gastroitestinal: N/A  Genitourinary: N/A Hematology/Lymphatic: N/A  Endocrine: N/A    Musculoskeletal: joint pain  Allergy/Immunology: N/A  Neurological: numbness  Psychiatric: depression  The following represents the patient's updated allergies and side effects list: No Known Allergies  Labs since last visit of relevance include the following: Results for orders placed or performed in visit on 04/17/14  Glucose (CBG)  Result Value Ref Range   POC Glucose 215 (A) 70 - 99 mg/dl    The neurologically relevant items on the patient's problem list were reviewed on today's visit.  Neurologic Examination  A problem focused neurological exam (12 or more points of the single system neurologic examination, vital signs counts as 1 point, cranial nerves count for 8 points) was performed.  Blood pressure 93/62, pulse 99, height 5' 3.5" (1.613 m), weight 104 lb 6.4 oz (47.356 kg), last menstrual period 03/10/2014.  Lying 93/42 - 99, sitting 83/61 - 109, standing 73/49 - 109  General - Well nourished, well developed, in no apparent distress.  Ophthalmologic - Sharp disc margins OU.  Cardiovascular - Regular rate and rhythm with no murmur.  Mental Status -  Level of arousal and orientation to time, place, and person were intact. Language including expression, naming, repetition, comprehension was assessed and found intact. Attention span and concentration were impaired as she is not able to do series 7. Fund of Knowledge was assessed and was intact.  Cranial Nerves II - XII - II - Visual field intact OU. III, IV, VI - Extraocular movements intact. V - Facial sensation intact bilaterally. VII - Facial movement intact bilaterally. VIII - Hearing & vestibular intact bilaterally. X - Palate elevates symmetrically. XI Geryl Councilman  turning & shoulder shrug intact bilaterally. XII - Tongue protrusion intact.  Motor Strength - The patient's strength was normal in upper extremities, but LEs 4/5 bilaterally and pronator drift was absent.  Bulk was normal and fasciculations were  absent.   Motor Tone - Muscle tone was assessed at the neck and appendages and was normal.  Reflexes - The patient's reflexes were normal in all extremities and she had no pathological reflexes.  Sensory - Light touch and temperature/pinprick were assessed and were normal. Vibration and proprioception decreased bilateral LE below the ankle, and Romberg testing positive.  Coordination - The patient had normal movements in the hands and feet with no ataxia or dysmetria.  Tremor was absent.  Gait and Station - slow, but steady, difficulty with tandem gait.  Data reviewed: I personally reviewed the images and agree with the radiology interpretations.  Dg Chest 2 View  04/14/2014 IMPRESSION: 1. Basilar right lower lobe airspace consolidation is new compared to the prior study, concerning for pneumonia, with likely with small parapneumonic pleural effusion. 2. Probable subsegmental atelectasis in the left lung base is unchanged. Small left pleural effusion is also unchanged.   Mr Brain Wo Contrast  04/13/2014 IMPRESSION: 1. Punctate area of acute nonhemorrhagic infarct involving the right coronal radiata adjacent to the lateral ventricle. 2. No significant left-sided disease. 3. Left maxillary, bilateral anterior ethmoid, and right frontal sinusitis with restricted diffusion consistent with acute infection.   CUS - Bilateral: 1-39% ICA stenosis. Vertebral artery flow is antegrade.  2D echo - Left ventricle: The cavity size was normal. Wall thickness was normal. Systolic function was normal. The estimated ejection fraction was in the range of 55% to 60%. Left ventricular diastolic function parameters were normal. - Pulmonary arteries: PA peak pressure: 31 mm Hg (S).  LDL 47 and A1C 9.2  Assessment: As you may recall, she is a 23 y.o. African American female with PMH of type I DM was admitted 04/07/14 due to hyperkalemia induced cardiac arrest with DKA. She required CPR and  intubation. After medical stabilization, her MRI showed punctate right corona radiata infarct, likely due to hypoperfusion during the cardiac arrest. No focal neurological deficit seen. She will need continued treatment for better control of DM. Her BP was at low side and even lower with standing up, but no sign or symptoms of orthostatic hypotension. Will recommend increase po intake. She has appointment to see her PCP this afternoon. For her DM neuropathy, will increase gabapentin to 100mg  tid.  Plan:  - BP at low side with dropping on standing, but no symptoms of orthostatic hypotension, will recommend increase po intake and follow up with PCP this pm as scheduled - increase gabapentin to 100mg  tid for neuropathy - Follow up with your primary care physician for stroke risk factor modification. Recommend maintain blood pressure goal <130/80, diabetes with hemoglobin A1c goal below 6.5% and lipids with LDL cholesterol goal below 70 mg/dL.  - continue ASA 81mg  for stroke prevention - may consider insulin pump for DM control. - RTC in 2 months.    Meds ordered this encounter  Medications  . gabapentin (NEURONTIN) 100 MG capsule    Sig: Take 1 capsule (100 mg total) by mouth 3 (three) times daily.    Dispense:  90 capsule    Refill:  3    Patient Instructions  - your BP in clinic was on the low side, especially standing up, but you do not have symptoms of orthostatic hypotension. We recommend to  have more fluid intake and control the DM. 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 No focal neurological deficit so far. - continue ASA 81mg  for stroke prevention. - Follow up with your primary care physician for stroke risk factor modification. Recommend maintain blood pressure goal <130/80, diabetes with hemoglobin A1c goal below 6.5% and lipids with LDL cholesterol  goal below 70 mg/dL.  - follow up in 2 months.    Rosalin Hawking, MD PhD [facility] [address] [place], [place] [address] [phone]

## 2014-04-24 NOTE — Patient Instructions (Signed)
-   your BP in clinic was on the low side, especially standing up, but you do not have symptoms of orthostatic hypotension. We recommend to have more fluid intake and control the DM. 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 No focal neurological deficit so far. - continue ASA 81mg  for stroke prevention. - Follow up with your primary care physician for stroke risk factor modification. Recommend maintain blood pressure goal <130/80, diabetes with hemoglobin A1c goal below 6.5% and lipids with LDL cholesterol goal below 70 mg/dL.  - follow up in 2 months.

## 2014-04-30 ENCOUNTER — Other Ambulatory Visit: Payer: Self-pay

## 2014-04-30 MED ORDER — INSULIN ASPART 100 UNIT/ML ~~LOC~~ SOLN: SUBCUTANEOUS | Status: DC

## 2014-04-30 MED ORDER — INSULIN GLARGINE 100 UNIT/ML ~~LOC~~ SOLN: 25.0000 [IU] | Freq: Every day | SUBCUTANEOUS | Status: DC

## 2014-07-04 ENCOUNTER — Encounter: Payer: Self-pay | Admitting: Neurology

## 2014-07-04 ENCOUNTER — Ambulatory Visit (INDEPENDENT_AMBULATORY_CARE_PROVIDER_SITE_OTHER): Payer: Self-pay | Admitting: Neurology

## 2014-07-04 VITALS — BP 115/77 | HR 119 | Ht 63.5 in | Wt 101.6 lb

## 2014-07-04 DIAGNOSIS — E1049 Type 1 diabetes mellitus with other diabetic neurological complication: Secondary | ICD-10-CM

## 2014-07-04 DIAGNOSIS — F329 Major depressive disorder, single episode, unspecified: Secondary | ICD-10-CM

## 2014-07-04 DIAGNOSIS — I63311 Cerebral infarction due to thrombosis of right middle cerebral artery: Secondary | ICD-10-CM

## 2014-07-04 DIAGNOSIS — F32A Depression, unspecified: Secondary | ICD-10-CM | POA: Insufficient documentation

## 2014-07-04 MED ORDER — CITALOPRAM HYDROBROMIDE 10 MG PO TABS: 10.0000 mg | ORAL_TABLET | Freq: Every day | ORAL | Status: DC

## 2014-07-04 MED ORDER — GABAPENTIN 100 MG PO CAPS: 100.0000 mg | ORAL_CAPSULE | Freq: Three times a day (TID) | ORAL | Status: DC

## 2014-07-04 NOTE — Patient Instructions (Addendum)
-   continue ASA 81 for stroke prevention - DM control is the key for stroke prevention - Follow up with your primary care physician for stroke risk factor modification. Recommend maintain blood pressure goal <130/80, diabetes with hemoglobin A1c goal below 6.5% and lipids with LDL cholesterol goal below 70 mg/dL.  - will prescribe gabapentin again - check glucose as home and avoid low sugar. - follow up as needed. - make an appointment with PCP sooner. - discuss with PCP for psychiatry and psychology referral for depression. Please talk to your mom or call suicidal hotline if has any suicidal ideation or homicidal ideation.  - prescribe celexa 10mg  daily for depression.

## 2014-07-04 NOTE — Progress Notes (Signed)
STROKE NEUROLOGY FOLLOW UP NOTE  NAME: [name] DOB: 1991-06-06  REASON FOR VISIT: stroke follow up HISTORY FROM: mom and chart  Today we had the pleasure of seeing [name] in follow-up at our Neurology Clinic. Pt was accompanied by mom.   History Summary [name] is an 24 y.o. female with a past medical history significant for type 1 diabetes mellitus, noncompliant with medications who was admitted by critical care on 04/07/14 after hyperkalemia induced cardiac arrest requiring CPR and intubation. Patient was also found to be on DKA at the time of admission. 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 She said that she is not aware of having stroke like symptoms before presenting to the hospital. Also, she denies HA, vertigo, double vision, difficulty swallowing, focal weakness, slurred speech , confusion, disorientation, language or vision impairment. Some numbness in her feet. Her stroke work up including carotid doppler and 2D echo were negative. A1C 9.2. Her stroke was thought due to low BP and low cerebral perfusion during the time of cardiac arrest. She was put on ASA 67m on discharge.  04/24/14 follow up -  the patient has been doing fair. She still feel generalized weakness, thirsty and frequent urination. Her glucose at home between 150-200s but today it was 391 at home. Her cough is much better but she still complains of numbness and tingling at toes and fingertips.  Interval History During the interval time, pt had no recurrent stroke symptoms. From stroke standpoint, she is doing well. However, her sugar still not in good control, she passed out twice last week due to low sugar and low BP. She still have generalized pain allover her body and she felt depressed most of the time and sometimes she admit she has suicidal ideation. However, she denies any  SI or HI today.   REVIEW OF SYSTEMS: Full 14 system review of systems performed and notable only for those listed below and in HPI above, all others are negative:  Constitutional: fatigue  Cardiovascular: palpitations  Ear/Nose/Throat: ringing in ears  Skin: N/A  Eyes: eye pain  Respiratory: SOB  Gastroitestinal: constipation  Genitourinary: N/A Hematology/Lymphatic: N/A  Endocrine: feeling cold  Musculoskeletal: joint pain  Allergy/Immunology: N/A  Neurological: dizziness, insomnia  Psychiatric: depression, not enough sleep, decreased energy, change in appetite, suicidal thoughts  The following represents the patient's updated allergies and side effects list: No Known Allergies  Labs since last visit of relevance include the following: Results for orders placed or performed in visit on 04/24/14  CBC with Differential  Result Value Ref Range   WBC 4.0 4.0 - 10.5 K/uL   RBC 3.98 3.87 - 5.11 MIL/uL   Hemoglobin 10.8 (L) 12.0 - 15.0 g/dL   HCT 34.8 (L) 36.0 - 46.0 %   MCV 87.4 78.0 - 100.0 fL   MCH 27.1 26.0 - 34.0 pg   MCHC 31.0 30.0 - 36.0 g/dL   RDW 15.1 11.5 - 15.5 %   Platelets 478 (H) 150 - 400 K/uL   Neutrophils Relative % 57 43 - 77 %   Neutro Abs 2.3 1.7 - 7.7 K/uL   Lymphocytes Relative 34 12 - 46 %   Lymphs Abs 1.4 0.7 - 4.0 K/uL   Monocytes Relative 6 3 - 12 %   Monocytes Absolute 0.2 0.1 - 1.0 K/uL   Eosinophils Relative 2 0 - 5 %  Eosinophils Absolute 0.1 0.0 - 0.7 K/uL   Basophils Relative 1 0 - 1 %   Basophils Absolute 0.0 0.0 - 0.1 K/uL   Smear Review Criteria for review not met   COMPLETE METABOLIC PANEL WITH GFR  Result Value Ref Range   Sodium 136 135 - 145 mEq/L   Potassium 4.8 3.5 - 5.3 mEq/L   Chloride 99 96 - 112 mEq/L   CO2 26 19 - 32 mEq/L   Glucose, Bld 421 (H) 70 - 99 mg/dL   BUN 26 (H) 6 - 23 mg/dL   Creat 1.30 (H) 0.50 - 1.10 mg/dL   Total Bilirubin 0.2 0.2 - 1.2 mg/dL   Alkaline Phosphatase 121 (H) 39 - 117 U/L   AST 15 0 - 37 U/L     ALT 16 0 - 35 U/L   Total Protein 6.5 6.0 - 8.3 g/dL   Albumin 3.2 (L) 3.5 - 5.2 g/dL   Calcium 9.6 8.4 - 10.5 mg/dL   GFR, Est African American 67 mL/min   GFR, Est Non African American 58 (L) mL/min    The neurologically relevant items on the patient's problem list were reviewed on today's visit.  Neurologic Examination  A problem focused neurological exam (12 or more points of the single system neurologic examination, vital signs counts as 1 point, cranial nerves count for 8 points) was performed.  Blood pressure 115/77, pulse 119, height 5' 3.5" (1.613 m), weight 101 lb 9.6 oz (46.085 kg).  General - Well nourished, well developed, in no apparent distress.  Ophthalmologic - Sharp disc margins OU.  Cardiovascular - Regular rate and rhythm with no murmur.  Mental Status -  Level of arousal and orientation to time, place, and person were intact. Language including expression, naming, repetition, comprehension was assessed and found intact. Attention span and concentration were impaired as she is not able to do series 7. Fund of Knowledge was assessed and was intact.  Cranial Nerves II - XII - II - Visual field intact OU. III, IV, VI - Extraocular movements intact. V - Facial sensation intact bilaterally. VII - Facial movement intact bilaterally. VIII - Hearing & vestibular intact bilaterally. X - Palate elevates symmetrically. XI - Chin turning & shoulder shrug intact bilaterally. XII - Tongue protrusion intact.  Motor Strength - The patient's strength was normal in upper extremities, but LEs 4/5 bilaterally and pronator drift was absent.  Bulk was normal and fasciculations were absent.   Motor Tone - Muscle tone was assessed at the neck and appendages and was normal.  Reflexes - The patient's reflexes were normal in all extremities and she had no pathological reflexes.  Sensory - Light touch and temperature/pinprick were assessed and were normal.   Coordination - The  patient had normal movements in the hands and feet with no ataxia or dysmetria.  Tremor was absent.  Gait and Station - slow, but steady, difficulty with tandem gait.  Data reviewed: I personally reviewed the images and agree with the radiology interpretations.  Dg Chest 2 View  04/14/2014 IMPRESSION: 1. Basilar right lower lobe airspace consolidation is new compared to the prior study, concerning for pneumonia, with likely with small parapneumonic pleural effusion. 2. Probable subsegmental atelectasis in the left lung base is unchanged. Small left pleural effusion is also unchanged.   Mr Brain Wo Contrast  04/13/2014 IMPRESSION: 1. Punctate area of acute nonhemorrhagic infarct involving the right coronal radiata adjacent to the lateral ventricle. 2. No significant left-sided disease. 3. Left  maxillary, bilateral anterior ethmoid, and right frontal sinusitis with restricted diffusion consistent with acute infection.   CUS - Bilateral: 1-39% ICA stenosis. Vertebral artery flow is antegrade.  2D echo - Left ventricle: The cavity size was normal. Wall thickness was normal. Systolic function was normal. The estimated ejection fraction was in the range of 55% to 60%. Left ventricular diastolic function parameters were normal. - Pulmonary arteries: PA peak pressure: 31 mm Hg (S).  LDL 47 and A1C 9.2  Assessment: As you may recall, she is a 24 y.o. African American female with PMH of type I DM was admitted 04/07/14 due to hyperkalemia induced cardiac arrest with DKA. She required CPR and intubation. After medical stabilization, her MRI showed punctate right corona radiata infarct, likely due to hypoperfusion during the cardiac arrest. No focal neurological deficit seen. She will need continued treatment for better control of DM. For her DM neuropathy, will increase gabapentin to 174m tid.  However, she stated that she is depressed and sometimes cries for it. She sometimes has SI but  she denies SI today. She did not meet back act criteria today. Mom stated that pt would talk to her about it. I encouraged her to call for help when it happens again. Will prescribe celexa for her today and recommend PCP to have psychiatry and psychology for depression treatment. Pt and mom expressed understanding.  Plan:  - continue ASA 81 for stroke prevention - DM control is the key for stroke prevention - Follow up with your primary care physician for stroke risk factor modification. Recommend maintain blood pressure goal <130/80, diabetes with hemoglobin A1c goal below 6.5% and lipids with LDL cholesterol goal below 70 mg/dL.  - will prescribe gabapentin again - recommend pt to discuss with PCP for psychiatry and psychology referral for depression. Pt encouraged to tell to mom or call suicidal hotline if has any SI or HI.  - prescribe celexa 114mdaily for depression treatment.  - check glucose as home and avoid low sugar. - RTC PRN.   Meds ordered this encounter  Medications  . gabapentin (NEURONTIN) 100 MG capsule    Sig: Take 1 capsule (100 mg total) by mouth 3 (three) times daily.    Dispense:  90 capsule    Refill:  3  . citalopram (CELEXA) 10 MG tablet    Sig: Take 1 tablet (10 mg total) by mouth daily.    Dispense:  30 tablet    Refill:  3    Patient Instructions  - continue ASA 81 for stroke prevention - DM control is the key for stroke prevention - Follow up with your primary care physician for stroke risk factor modification. Recommend maintain blood pressure goal <130/80, diabetes with hemoglobin A1c goal below 6.5% and lipids with LDL cholesterol goal below 70 mg/dL.  - will prescribe gabapentin again - check glucose as home and avoid low sugar. - follow up as needed. - make an appointment with PCP sooner. - discuss with PCP for psychiatry and psychology referral for depression. Please talk to your mom or call suicidal hotline if has any suicidal ideation or  homicidal ideation.  - prescribe celexa 1028maily for depression.    JinRosalin HawkingD PhD [facility] [id number]uiAgencyeInvernessC [phone]

## 2014-09-06 DIAGNOSIS — Z0289 Encounter for other administrative examinations: Secondary | ICD-10-CM

## 2015-06-15 DIAGNOSIS — E040 Nontoxic diffuse goiter: Secondary | ICD-10-CM | POA: Insufficient documentation

## 2017-01-25 ENCOUNTER — Emergency Department (HOSPITAL_COMMUNITY): Payer: Medicaid Other

## 2017-01-25 ENCOUNTER — Encounter (HOSPITAL_COMMUNITY): Payer: Self-pay | Admitting: Emergency Medicine

## 2017-01-25 ENCOUNTER — Inpatient Hospital Stay (HOSPITAL_COMMUNITY)
Admission: EM | Admit: 2017-01-25 | Discharge: 2017-01-29 | DRG: 674 | Disposition: A | Discharge status: Home / Self Care | Payer: Medicaid Other | Attending: Internal Medicine | Admitting: Internal Medicine

## 2017-01-25 DIAGNOSIS — E1065 Type 1 diabetes mellitus with hyperglycemia: Secondary | ICD-10-CM

## 2017-01-25 DIAGNOSIS — Z789 Other specified health status: Secondary | ICD-10-CM

## 2017-01-25 DIAGNOSIS — N186 End stage renal disease: Principal | ICD-10-CM | POA: Diagnosis present

## 2017-01-25 DIAGNOSIS — E1021 Type 1 diabetes mellitus with diabetic nephropathy: Secondary | ICD-10-CM | POA: Diagnosis present

## 2017-01-25 DIAGNOSIS — D509 Iron deficiency anemia, unspecified: Secondary | ICD-10-CM | POA: Diagnosis present

## 2017-01-25 DIAGNOSIS — E875 Hyperkalemia: Secondary | ICD-10-CM | POA: Diagnosis present

## 2017-01-25 DIAGNOSIS — IMO0002 Reserved for concepts with insufficient information to code with codable children: Secondary | ICD-10-CM | POA: Diagnosis present

## 2017-01-25 DIAGNOSIS — E8770 Fluid overload, unspecified: Secondary | ICD-10-CM | POA: Diagnosis present

## 2017-01-25 DIAGNOSIS — D631 Anemia in chronic kidney disease: Secondary | ICD-10-CM | POA: Diagnosis present

## 2017-01-25 DIAGNOSIS — N179 Acute kidney failure, unspecified: Secondary | ICD-10-CM | POA: Diagnosis present

## 2017-01-25 DIAGNOSIS — D649 Anemia, unspecified: Secondary | ICD-10-CM | POA: Diagnosis present

## 2017-01-25 DIAGNOSIS — E10649 Type 1 diabetes mellitus with hypoglycemia without coma: Secondary | ICD-10-CM | POA: Diagnosis present

## 2017-01-25 DIAGNOSIS — N2581 Secondary hyperparathyroidism of renal origin: Secondary | ICD-10-CM | POA: Diagnosis present

## 2017-01-25 DIAGNOSIS — E1029 Type 1 diabetes mellitus with other diabetic kidney complication: Secondary | ICD-10-CM | POA: Diagnosis present

## 2017-01-25 DIAGNOSIS — Z833 Family history of diabetes mellitus: Secondary | ICD-10-CM

## 2017-01-25 DIAGNOSIS — R918 Other nonspecific abnormal finding of lung field: Secondary | ICD-10-CM

## 2017-01-25 DIAGNOSIS — R0902 Hypoxemia: Secondary | ICD-10-CM

## 2017-01-25 DIAGNOSIS — Z7682 Awaiting organ transplant status: Secondary | ICD-10-CM

## 2017-01-25 DIAGNOSIS — E1022 Type 1 diabetes mellitus with diabetic chronic kidney disease: Secondary | ICD-10-CM | POA: Diagnosis present

## 2017-01-25 DIAGNOSIS — Z992 Dependence on renal dialysis: Secondary | ICD-10-CM

## 2017-01-25 DIAGNOSIS — Z8674 Personal history of sudden cardiac arrest: Secondary | ICD-10-CM

## 2017-01-25 HISTORY — DX: Essential (primary) hypertension: I10

## 2017-01-25 HISTORY — DX: Adverse effect of unspecified anesthetic, initial encounter: T41.45XA

## 2017-01-25 HISTORY — DX: Other complications of anesthesia, initial encounter: T88.59XA

## 2017-01-25 HISTORY — DX: Other specified postprocedural states: Z98.890

## 2017-01-25 HISTORY — DX: Other specified postprocedural states: R11.2

## 2017-01-25 HISTORY — DX: End stage renal disease: N18.6

## 2017-01-25 LAB — BASIC METABOLIC PANEL
ANION GAP: 12 (ref 5–15)
BUN: 47 mg/dL — ABNORMAL HIGH (ref 6–20)
CALCIUM: 6.8 mg/dL — AB (ref 8.9–10.3)
CO2: 17 mmol/L — AB (ref 22–32)
Chloride: 111 mmol/L (ref 101–111)
Creatinine, Ser: 8.78 mg/dL — ABNORMAL HIGH (ref 0.44–1.00)
GFR calc non Af Amer: 6 mL/min — ABNORMAL LOW (ref >60)
GFR, EST AFRICAN AMERICAN: 7 mL/min — AB (ref >60)
GLUCOSE: 82 mg/dL (ref 65–99)
POTASSIUM: 5.5 mmol/L — AB (ref 3.5–5.1)
Sodium: 140 mmol/L (ref 135–145)

## 2017-01-25 LAB — CBC WITH DIFFERENTIAL/PLATELET
BASOS ABS: 0 10*3/uL (ref 0.0–0.1)
Basophils Relative: 0 %
EOS ABS: 0.1 10*3/uL (ref 0.0–0.7)
Eosinophils Relative: 2 %
HCT: 20.3 % — ABNORMAL LOW (ref 36.0–46.0)
HEMOGLOBIN: 6.6 g/dL — AB (ref 12.0–15.0)
Lymphocytes Relative: 20 %
Lymphs Abs: 1.3 10*3/uL (ref 0.7–4.0)
MCH: 27.3 pg (ref 26.0–34.0)
MCHC: 32.5 g/dL (ref 30.0–36.0)
MCV: 83.9 fL (ref 78.0–100.0)
Monocytes Absolute: 0.3 10*3/uL (ref 0.1–1.0)
Monocytes Relative: 5 %
NEUTROS ABS: 4.9 10*3/uL (ref 1.7–7.7)
Neutrophils Relative %: 73 %
PLATELETS: 284 10*3/uL (ref 150–400)
RBC: 2.42 MIL/uL — AB (ref 3.87–5.11)
RDW: 12.8 % (ref 11.5–15.5)
WBC: 6.7 10*3/uL (ref 4.0–10.5)

## 2017-01-25 LAB — CBG MONITORING, ED
GLUCOSE-CAPILLARY: 194 mg/dL — AB (ref 65–99)
Glucose-Capillary: 92 mg/dL (ref 65–99)

## 2017-01-25 LAB — I-STAT CHEM 8, ED
BUN: 43 mg/dL — ABNORMAL HIGH (ref 6–20)
CALCIUM ION: 0.73 mmol/L — AB (ref 1.15–1.40)
CHLORIDE: 114 mmol/L — AB (ref 101–111)
CREATININE: 9.7 mg/dL — AB (ref 0.44–1.00)
GLUCOSE: 103 mg/dL — AB (ref 65–99)
HCT: 19 % — ABNORMAL LOW (ref 36.0–46.0)
HEMOGLOBIN: 6.5 g/dL — AB (ref 12.0–15.0)
POTASSIUM: 5.3 mmol/L — AB (ref 3.5–5.1)
SODIUM: 138 mmol/L (ref 135–145)
TCO2: 13 mmol/L (ref 0–100)

## 2017-01-25 LAB — I-STAT BETA HCG BLOOD, ED (MC, WL, AP ONLY): I-stat hCG, quantitative: <5 m[IU]/mL (ref <5)

## 2017-01-25 LAB — BRAIN NATRIURETIC PEPTIDE: B NATRIURETIC PEPTIDE 5: 944.1 pg/mL — AB (ref 0.0–100.0)

## 2017-01-25 MED ORDER — LEVOFLOXACIN IN D5W 750 MG/150ML IV SOLN
750.0000 mg | Freq: Once | INTRAVENOUS | Status: AC
  Administered 2017-01-25: 750 mg via INTRAVENOUS
  Filled 2017-01-25: qty 150

## 2017-01-25 MED ORDER — SODIUM CHLORIDE 0.9 % IV BOLUS (SEPSIS): 1000.0000 mL | Freq: Once | INTRAVENOUS | Status: DC

## 2017-01-25 MED ORDER — INSULIN GLARGINE 100 UNIT/ML ~~LOC~~ SOLN
14.0000 [IU] | Freq: Once | SUBCUTANEOUS | Status: AC
  Administered 2017-01-25: 14 [IU] via SUBCUTANEOUS
  Filled 2017-01-25: qty 0.14

## 2017-01-25 NOTE — ED Provider Notes (Signed)
[facility] DEPT Provider Note   CSN: [id number] Arrival date & time: 01/25/17  1425     History   Chief Complaint Chief Complaint  Patient presents with  . Cough    HPI [name] is a 26 y.o. female. she presents emergency department with chief complaint of cough and wheezing. .she has a past history of tachycardia cardiac arrest, DM1 and DKA. She takes amlodipine and metoprolol. She has ESRD and is on the transplant list at [facility]. She had a recent AV graft.  She is not on dialysis. She denies any new  she did not take it today. One month ago she was diagnosed with community-acquired pneumonia. She was doing well up until yesterday when she began having a painful, productive cough. She states that she was unable to sleep. She did not take anything for her cough. She felt like she had wheezing and shortness of breath. She denies fevers or chills.  HPI  Past Medical History:  Diagnosis Date  . Cardiac arrest ([place])   . Diabetes mellitus type 1 ([place])    2005 dx [facility]  . DKA (diabetic ketoacidoses) [facility])     Patient Active Problem List   Diagnosis Date Noted  . Depression 07/04/2014  . Hemodialysis-associated hypotension 04/24/2014  . Cerebral infarction due to thrombosis of right middle cerebral artery ([place]) 04/24/2014  . Diabetes mellitus due to underlying condition without complications ([place]) 06/23/7251  . History of anemia 04/17/2014  . Cardiac arrest ([place]) 04/14/2014  . Diabetic neuropathy ([place]) 02/14/2014  . DKA, type 1 ([place]) 02/12/2014  . Tachycardia 02/12/2014  . DOE (dyspnea on exertion) 02/12/2014  . Abdominal pain 02/12/2014  . DKA, type 1, not at goal [facility]) 02/12/2014  . Type I diabetes mellitus with renal manifestations, uncontrolled ([place]) 12/08/2013  . Hypokalemia 12/07/2013  . Noncompliance 12/06/2013  . DKA (diabetic ketoacidoses) ([place]) 12/05/2013  . Leukocytosis 12/05/2013  . Acute on chronic renal failure ([place]) 12/05/2013  .  Hypercalcemia 12/05/2013  . Abnormal LFTs 12/05/2013  . Elevated lipase 12/05/2013    Past Surgical History:  Procedure Laterality Date  . CATARACT EXTRACTION     6 years ago    OB History    No data available       Home Medications    Prior to Admission medications   Medication Sig Start Date End Date Taking? Authorizing Provider  aspirin 81 MG chewable tablet Chew 1 tablet (81 mg total) by mouth daily. 04/16/14   Thurnell Lose, MD  citalopram (CELEXA) 10 MG tablet Take 1 tablet (10 mg total) by mouth daily. 07/04/14   Rosalin Hawking, MD  ferrous sulfate 300 (60 FE) MG/5ML syrup Take 5 mLs (300 mg total) by mouth 3 (three) times daily with meals. 04/16/14   Thurnell Lose, MD  gabapentin (NEURONTIN) 100 MG capsule Take 1 capsule (100 mg total) by mouth 3 (three) times daily. 07/04/14   Rosalin Hawking, MD  ibuprofen (ADVIL,MOTRIN) 200 MG tablet Take 400 mg by mouth as needed for headache or cramping.     [provider]  insulin aspart (NOVOLOG) 100 UNIT/ML injection Before each meal 3 times a day, 140-199 - 2 units, 200-250 - 4 units, 251-299 - 6 units,  300-349 - 8 units,  350 or above 10 units. Insulin PEN if approved, provide syringes and needles if needed. 04/30/14   Lorayne Marek, MD  insulin glargine (LANTUS) 100 UNIT/ML injection Inject 0.25 mLs (25 Units total) into the skin at bedtime.  04/30/14   Lorayne Marek, MD    Family History Family History  Problem Relation Age of Onset  . Diabetes Maternal Grandmother   . Stroke Maternal Grandmother   . Cancer Maternal Aunt        aunt, passed last month, ?RCC  . Cancer Maternal Uncle     Social History Social History  Substance Use Topics  . Smoking status: Never Smoker  . Smokeless tobacco: Never Used  . Alcohol use No     Allergies   Patient has no known allergies.   Review of Systems Review of Systems  Ten systems reviewed and are negative for acute change, except as noted in the HPI.    Physical Exam Updated Vital Signs BP (!) 161/98 (BP Location: Right Arm)   Pulse (!) 105   Temp 98.1 F (36.7 C) (Oral)   Resp 20   Ht 5\' 1"  (1.549 m)   Wt 62.1 kg (137 lb)   SpO2 96%   BMI 25.89 kg/m   Physical Exam  Constitutional: She is oriented to person, place, and time. She appears well-developed and well-nourished. No distress.  HENT:  Head: Normocephalic and atraumatic.  Eyes: Conjunctivae are normal. No scleral icterus.  Neck: Normal range of motion.  Cardiovascular: Normal rate, regular rhythm and normal heart sounds.  Exam reveals no gallop and no friction rub.   No murmur heard. Pulmonary/Chest: Effort normal and breath sounds normal. No respiratory distress. She has no wheezes. She has no rales.  Abdominal: Soft. Bowel sounds are normal. She exhibits no distension and no mass. There is no tenderness. There is no guarding.  Neurological: She is alert and oriented to person, place, and time.  Skin: Skin is warm and dry. She is not diaphoretic.  Psychiatric: Her behavior is normal.  Nursing note and vitals reviewed.    ED Treatments / Results  Labs (all labs ordered are listed, but only abnormal results are displayed) Labs Reviewed  CBC WITH DIFFERENTIAL/PLATELET - Abnormal; Notable for the following:       Result Value   RBC 2.42 (*)    Hemoglobin 6.6 (*)    HCT 20.3 (*)    All other components within normal limits  BASIC METABOLIC PANEL - Abnormal; Notable for the following:    Potassium 5.5 (*)    CO2 17 (*)    BUN 47 (*)    Creatinine, Ser 8.78 (*)    Calcium 6.8 (*)    GFR calc non Af Amer 6 (*)    GFR calc Af Amer 7 (*)    All other components within normal limits  I-STAT CHEM 8, ED - Abnormal; Notable for the following:    Potassium 5.3 (*)    Chloride 114 (*)    BUN 43 (*)    Creatinine, Ser 9.70 (*)    Glucose, Bld 103 (*)    Calcium, Ion 0.73 (*)    Hemoglobin 6.5 (*)    HCT 19.0 (*)    All other components within normal limits   BRAIN NATRIURETIC PEPTIDE  CBG MONITORING, ED  I-STAT BETA HCG BLOOD, ED ([facility], [facility], [facility] ONLY)  TYPE AND SCREEN    EKG  EKG Interpretation None       Radiology Dg Chest 2 View  Result Date: 01/25/2017 CLINICAL DATA:  Productive cough x2 days. Left-sided chest pain. Dialysis patient. EXAM: CHEST  2 VIEW COMPARISON:  04/24/2014 FINDINGS: Slightly low lung volumes with mild cardiac enlargement. No aortic aneurysm. Patchy airspace  disease in the left upper and both lower lobes with trace bilateral pleural effusions left greater than right. Mild interstitial edema is also noted. No acute osseous abnormality. IMPRESSION: 1. Findings suggest multilobar pneumonia more so the left upper and right lower lobes. 2. Trace bilateral pleural effusions, left greater than right. 3. Mild interstitial edema and cardiomegaly. Electronically Signed   By: Ashley Royalty M.D.   On: 01/25/2017 19:46    Procedures Procedures (including critical care time)  Medications Ordered in ED Medications  levofloxacin (LEVAQUIN) IVPB 750 mg (not administered)     Initial Impression / Assessment and Plan / ED Course  I have reviewed the triage vital signs and the nursing notes.  Pertinent labs & imaging results that were available during my care of the patient were reviewed by me and considered in my medical decision making (see chart for details).  Clinical Course as of Jan 25 2121  Tue Jan 25, 2017  2032 Patient desaturated to 84% while ambulating.  [AH]  2103 Last hgb 8.0 on 11/19/2016 at [facility]. She has had a bout a 1 g drop in hgb.  [AH]    Clinical Course User Index [AH] Margarita Mail, PA-C    Patient with multilobar pneumonia and new oxygen requirement. She will need inpatient admission.  Patient started on fluids and IV levaquin  Patient will need transfer to [facility] I have given sign out to PA [place] who will assume care for transfer.  Final Clinical Impressions(s) / ED Diagnoses   Final diagnoses:   Hypoxia  Anemia, unspecified type  Pulmonary infiltrates on CXR    New Prescriptions New Prescriptions   No medications on file     Margarita Mail, PA-C 01/25/17 2249    Macarthur Critchley, MD 01/25/17 2345

## 2017-01-25 NOTE — ED Notes (Signed)
Ambulated patient in the hall. SpO2 stayed between 86-88 while ambulating. Patient stated she felt dizzy/light headed. RN aware.

## 2017-01-25 NOTE — ED Provider Notes (Signed)
T1DM, followed at [facility] Pending dialysis - has fistula placed (never used)  Here for wheezing and cough, similar to previous pneumonia x 1 month ago No fever On transplant list for kidney hgb down to 6.6 from 8.1 in June Desaturation to 80's when ambulating, 92% on 2L while sitting up.   Patient signed out from [place], [place], who spoke to [facility] for admission. Dr. Roel Cluck recommended transfer to [facility] for further treatment.   No leukocytosis BNP 944 CXR - multilobar PNA, LUL, RLL Bilateral pleural effusions, L<R Mild interstitial edema, CM  Plan: call [facility] for transfer and admission  Discussed with Nephrology service at [facility] who reports they have no available beds and cannot accept the patient for care. Discussed with [facility], Dr. Alcario Drought, who accepts the patient for admission. Nephrology here consulted at the request of hospitalist, Dr. Marval Regal, who states they will follow in hospital to assist with care.    Charlann Lange, PA-C 01/26/17 Rae Lips    Dorie Rank, MD 01/27/17 [phone]

## 2017-01-25 NOTE — ED Triage Notes (Signed)
States had pneumonia a month ago and fels like she is getting it again , states wheezes at night and has a cough and she is coughing up phlegm, states she is sob when she walks a ways

## 2017-01-26 ENCOUNTER — Inpatient Hospital Stay (HOSPITAL_COMMUNITY): Payer: Medicaid Other

## 2017-01-26 ENCOUNTER — Encounter (HOSPITAL_COMMUNITY): Payer: Self-pay | Admitting: General Surgery

## 2017-01-26 DIAGNOSIS — E8770 Fluid overload, unspecified: Secondary | ICD-10-CM | POA: Diagnosis present

## 2017-01-26 DIAGNOSIS — N179 Acute kidney failure, unspecified: Secondary | ICD-10-CM | POA: Diagnosis present

## 2017-01-26 DIAGNOSIS — N2581 Secondary hyperparathyroidism of renal origin: Secondary | ICD-10-CM | POA: Diagnosis present

## 2017-01-26 DIAGNOSIS — E1065 Type 1 diabetes mellitus with hyperglycemia: Secondary | ICD-10-CM

## 2017-01-26 DIAGNOSIS — N186 End stage renal disease: Secondary | ICD-10-CM | POA: Diagnosis present

## 2017-01-26 DIAGNOSIS — D649 Anemia, unspecified: Secondary | ICD-10-CM | POA: Diagnosis present

## 2017-01-26 DIAGNOSIS — D631 Anemia in chronic kidney disease: Secondary | ICD-10-CM | POA: Diagnosis present

## 2017-01-26 DIAGNOSIS — Z7682 Awaiting organ transplant status: Secondary | ICD-10-CM | POA: Diagnosis not present

## 2017-01-26 DIAGNOSIS — N185 Chronic kidney disease, stage 5: Secondary | ICD-10-CM

## 2017-01-26 DIAGNOSIS — R0902 Hypoxemia: Secondary | ICD-10-CM

## 2017-01-26 DIAGNOSIS — R918 Other nonspecific abnormal finding of lung field: Secondary | ICD-10-CM

## 2017-01-26 DIAGNOSIS — E1022 Type 1 diabetes mellitus with diabetic chronic kidney disease: Secondary | ICD-10-CM | POA: Diagnosis present

## 2017-01-26 DIAGNOSIS — D509 Iron deficiency anemia, unspecified: Secondary | ICD-10-CM | POA: Diagnosis present

## 2017-01-26 DIAGNOSIS — Z833 Family history of diabetes mellitus: Secondary | ICD-10-CM | POA: Diagnosis not present

## 2017-01-26 DIAGNOSIS — Z992 Dependence on renal dialysis: Secondary | ICD-10-CM | POA: Diagnosis not present

## 2017-01-26 DIAGNOSIS — E1021 Type 1 diabetes mellitus with diabetic nephropathy: Secondary | ICD-10-CM | POA: Diagnosis present

## 2017-01-26 DIAGNOSIS — E10649 Type 1 diabetes mellitus with hypoglycemia without coma: Secondary | ICD-10-CM | POA: Diagnosis present

## 2017-01-26 DIAGNOSIS — E875 Hyperkalemia: Secondary | ICD-10-CM | POA: Diagnosis present

## 2017-01-26 DIAGNOSIS — Z8674 Personal history of sudden cardiac arrest: Secondary | ICD-10-CM | POA: Diagnosis not present

## 2017-01-26 HISTORY — DX: End stage renal disease: N18.6

## 2017-01-26 HISTORY — PX: IR FLUORO GUIDE CV LINE RIGHT: IMG2283

## 2017-01-26 HISTORY — PX: IR US GUIDE VASC ACCESS RIGHT: IMG2390

## 2017-01-26 LAB — HIV ANTIBODY (ROUTINE TESTING W REFLEX): HIV Screen 4th Generation wRfx: NONREACTIVE

## 2017-01-26 LAB — RENAL FUNCTION PANEL
ALBUMIN: 2.2 g/dL — AB (ref 3.5–5.0)
Anion gap: 10 (ref 5–15)
BUN: 44 mg/dL — AB (ref 6–20)
CO2: 20 mmol/L — AB (ref 22–32)
Calcium: 6.5 mg/dL — ABNORMAL LOW (ref 8.9–10.3)
Chloride: 106 mmol/L (ref 101–111)
Creatinine, Ser: 9.07 mg/dL — ABNORMAL HIGH (ref 0.44–1.00)
GFR calc Af Amer: 6 mL/min — ABNORMAL LOW (ref >60)
GFR calc non Af Amer: 5 mL/min — ABNORMAL LOW (ref >60)
GLUCOSE: 138 mg/dL — AB (ref 65–99)
PHOSPHORUS: 5.2 mg/dL — AB (ref 2.5–4.6)
POTASSIUM: 5.1 mmol/L (ref 3.5–5.1)
Sodium: 136 mmol/L (ref 135–145)

## 2017-01-26 LAB — BASIC METABOLIC PANEL WITH GFR
Anion gap: 10 (ref 5–15)
BUN: 48 mg/dL — ABNORMAL HIGH (ref 6–20)
CO2: 18 mmol/L — ABNORMAL LOW (ref 22–32)
Calcium: 6.6 mg/dL — ABNORMAL LOW (ref 8.9–10.3)
Chloride: 110 mmol/L (ref 101–111)
Creatinine, Ser: 9.03 mg/dL — ABNORMAL HIGH (ref 0.44–1.00)
GFR calc Af Amer: 6 mL/min — ABNORMAL LOW
GFR calc non Af Amer: 5 mL/min — ABNORMAL LOW
Glucose, Bld: 67 mg/dL (ref 65–99)
Potassium: 5.3 mmol/L — ABNORMAL HIGH (ref 3.5–5.1)
Sodium: 138 mmol/L (ref 135–145)

## 2017-01-26 LAB — CBC
HCT: 18.5 % — ABNORMAL LOW (ref 36.0–46.0)
HEMATOCRIT: 17.8 % — AB (ref 36.0–46.0)
Hemoglobin: 6 g/dL — CL (ref 12.0–15.0)
Hemoglobin: 5.6 g/dL — CL (ref 12.0–15.0)
MCH: 27.4 pg (ref 26.0–34.0)
MCH: 26.7 pg (ref 26.0–34.0)
MCHC: 32.4 g/dL (ref 30.0–36.0)
MCHC: 31.5 g/dL (ref 30.0–36.0)
MCV: 84.5 fL (ref 78.0–100.0)
MCV: 84.8 fL (ref 78.0–100.0)
Platelets: 275 10*3/uL (ref 150–400)
Platelets: 243 10*3/uL (ref 150–400)
RBC: 2.19 MIL/uL — ABNORMAL LOW (ref 3.87–5.11)
RBC: 2.1 MIL/uL — ABNORMAL LOW (ref 3.87–5.11)
RDW: 13.4 % (ref 11.5–15.5)
RDW: 13.1 % (ref 11.5–15.5)
WBC: 6.1 10*3/uL (ref 4.0–10.5)
WBC: 6.3 10*3/uL (ref 4.0–10.5)

## 2017-01-26 LAB — ALT: ALT: 9 U/L — AB (ref 14–54)

## 2017-01-26 LAB — GLUCOSE, CAPILLARY
GLUCOSE-CAPILLARY: 131 mg/dL — AB (ref 65–99)
GLUCOSE-CAPILLARY: 42 mg/dL — AB (ref 65–99)
GLUCOSE-CAPILLARY: 198 mg/dL — AB (ref 65–99)
GLUCOSE-CAPILLARY: 124 mg/dL — AB (ref 65–99)
Glucose-Capillary: 72 mg/dL (ref 65–99)
Glucose-Capillary: 53 mg/dL — ABNORMAL LOW (ref 65–99)

## 2017-01-26 LAB — CBG MONITORING, ED
GLUCOSE-CAPILLARY: 37 mg/dL — AB (ref 65–99)
GLUCOSE-CAPILLARY: 52 mg/dL — AB (ref 65–99)
GLUCOSE-CAPILLARY: 103 mg/dL — AB (ref 65–99)
Glucose-Capillary: 38 mg/dL — CL (ref 65–99)
Glucose-Capillary: 244 mg/dL — ABNORMAL HIGH (ref 65–99)
Glucose-Capillary: 75 mg/dL (ref 65–99)
Glucose-Capillary: 68 mg/dL (ref 65–99)

## 2017-01-26 LAB — IRON AND TIBC
Iron: 32 ug/dL (ref 28–170)
SATURATION RATIOS: 14 % (ref 10.4–31.8)
TIBC: 230 ug/dL — ABNORMAL LOW (ref 250–450)
UIBC: 198 ug/dL

## 2017-01-26 LAB — PROTIME-INR
INR: 1.18
PROTHROMBIN TIME: 15 s (ref 11.4–15.2)

## 2017-01-26 LAB — RETICULOCYTES
RBC.: 2.19 MIL/uL — ABNORMAL LOW (ref 3.87–5.11)
Retic Count, Absolute: 32.9 10*3/uL (ref 19.0–186.0)
Retic Ct Pct: 1.5 % (ref 0.4–3.1)

## 2017-01-26 LAB — PREPARE RBC (CROSSMATCH)

## 2017-01-26 LAB — FERRITIN: Ferritin: 135 ng/mL (ref 11–307)

## 2017-01-26 MED ORDER — MIDAZOLAM HCL 2 MG/2ML IJ SOLN
INTRAMUSCULAR | Status: AC
  Filled 2017-01-26: qty 4

## 2017-01-26 MED ORDER — DEXTROSE 50 % IV SOLN
1.0000 | Freq: Once | INTRAVENOUS | Status: AC
  Administered 2017-01-26: 25 mL via INTRAVENOUS

## 2017-01-26 MED ORDER — DEXTROSE 50 % IV SOLN
INTRAVENOUS | Status: AC
  Administered 2017-01-26: 50 mL
  Filled 2017-01-26: qty 50

## 2017-01-26 MED ORDER — ACETAMINOPHEN 325 MG PO TABS
650.0000 mg | ORAL_TABLET | Freq: Four times a day (QID) | ORAL | Status: DC | PRN
  Administered 2017-01-26: 650 mg via ORAL
  Filled 2017-01-26: qty 2

## 2017-01-26 MED ORDER — DEXTROSE 50 % IV SOLN
INTRAVENOUS | Status: AC
  Administered 2017-01-26: 25 mL via INTRAVENOUS
  Filled 2017-01-26: qty 50

## 2017-01-26 MED ORDER — METOPROLOL SUCCINATE ER 25 MG PO TB24
25.0000 mg | ORAL_TABLET | Freq: Every day | ORAL | Status: DC
  Administered 2017-01-26 – 2017-01-29 (×4): 25 mg via ORAL
  Filled 2017-01-26 (×4): qty 1

## 2017-01-26 MED ORDER — RENA-VITE PO TABS
1.0000 | ORAL_TABLET | Freq: Every day | ORAL | Status: DC
  Administered 2017-01-27 – 2017-01-28 (×3): 1 via ORAL
  Filled 2017-01-26 (×3): qty 1

## 2017-01-26 MED ORDER — CEFAZOLIN SODIUM-DEXTROSE 2-4 GM/100ML-% IV SOLN
2.0000 g | INTRAVENOUS | Status: DC
  Filled 2017-01-26: qty 100

## 2017-01-26 MED ORDER — ONDANSETRON HCL 4 MG/2ML IJ SOLN
INTRAMUSCULAR | Status: AC
  Filled 2017-01-26: qty 2

## 2017-01-26 MED ORDER — DARBEPOETIN ALFA 200 MCG/0.4ML IJ SOSY
200.0000 ug | PREFILLED_SYRINGE | INTRAMUSCULAR | Status: DC
  Administered 2017-01-26: 200 ug via INTRAVENOUS

## 2017-01-26 MED ORDER — SODIUM CHLORIDE 0.9 % IV SOLN: Freq: Once | INTRAVENOUS | Status: DC

## 2017-01-26 MED ORDER — SENNA 8.6 MG PO TABS
1.0000 | ORAL_TABLET | Freq: Every day | ORAL | Status: DC
  Administered 2017-01-29: 8.6 mg via ORAL
  Filled 2017-01-26 (×3): qty 1

## 2017-01-26 MED ORDER — DEXTROSE 50 % IV SOLN
INTRAVENOUS | Status: AC
  Administered 2017-01-26: 17:00:00
  Filled 2017-01-26: qty 50

## 2017-01-26 MED ORDER — FENTANYL CITRATE (PF) 100 MCG/2ML IJ SOLN
INTRAMUSCULAR | Status: AC
  Filled 2017-01-26: qty 4

## 2017-01-26 MED ORDER — CEFAZOLIN SODIUM-DEXTROSE 2-4 GM/100ML-% IV SOLN
INTRAVENOUS | Status: AC
  Filled 2017-01-26: qty 100

## 2017-01-26 MED ORDER — LIDOCAINE HCL (PF) 1 % IJ SOLN
INTRAMUSCULAR | Status: AC
  Filled 2017-01-26: qty 30

## 2017-01-26 MED ORDER — SODIUM BICARBONATE 650 MG PO TABS: 1300.0000 mg | ORAL_TABLET | Freq: Three times a day (TID) | ORAL | Status: DC

## 2017-01-26 MED ORDER — ACETAMINOPHEN 650 MG RE SUPP: 650.0000 mg | Freq: Four times a day (QID) | RECTAL | Status: DC | PRN

## 2017-01-26 MED ORDER — DARBEPOETIN ALFA 200 MCG/0.4ML IJ SOSY
PREFILLED_SYRINGE | INTRAMUSCULAR | Status: AC
  Administered 2017-01-26: 200 ug via INTRAVENOUS
  Filled 2017-01-26: qty 0.4

## 2017-01-26 MED ORDER — HEPARIN SODIUM (PORCINE) 5000 UNIT/ML IJ SOLN
5000.0000 [IU] | Freq: Three times a day (TID) | INTRAMUSCULAR | Status: DC
  Administered 2017-01-27 – 2017-01-28 (×6): 5000 [IU] via SUBCUTANEOUS
  Filled 2017-01-26 (×7): qty 1

## 2017-01-26 MED ORDER — KETOROLAC TROMETHAMINE 0.5 % OP SOLN
1.0000 [drp] | Freq: Four times a day (QID) | OPHTHALMIC | Status: DC
  Administered 2017-01-27 – 2017-01-28 (×6): 1 [drp] via OPHTHALMIC
  Filled 2017-01-26: qty 3

## 2017-01-26 MED ORDER — IBUPROFEN 400 MG PO TABS: 400.0000 mg | ORAL_TABLET | Freq: Four times a day (QID) | ORAL | Status: DC | PRN

## 2017-01-26 MED ORDER — INSULIN GLARGINE 100 UNIT/ML ~~LOC~~ SOLN
5.0000 [IU] | Freq: Every day | SUBCUTANEOUS | Status: DC
  Administered 2017-01-27 – 2017-01-28 (×3): 5 [IU] via SUBCUTANEOUS
  Filled 2017-01-26 (×4): qty 0.05

## 2017-01-26 MED ORDER — FUROSEMIDE 10 MG/ML IJ SOLN
160.0000 mg | Freq: Once | INTRAMUSCULAR | Status: AC
  Administered 2017-01-26: 160 mg via INTRAVENOUS
  Filled 2017-01-26: qty 16

## 2017-01-26 MED ORDER — ONDANSETRON HCL 4 MG/2ML IJ SOLN
4.0000 mg | Freq: Four times a day (QID) | INTRAMUSCULAR | Status: DC | PRN
  Administered 2017-01-27 – 2017-01-29 (×2): 4 mg via INTRAVENOUS
  Filled 2017-01-26: qty 2

## 2017-01-26 MED ORDER — HEPARIN SODIUM (PORCINE) 1000 UNIT/ML IJ SOLN
INTRAMUSCULAR | Status: AC
  Filled 2017-01-26: qty 1

## 2017-01-26 MED ORDER — NORETHINDRONE 0.35 MG PO TABS: 1.0000 | ORAL_TABLET | Freq: Every day | ORAL | Status: DC

## 2017-01-26 MED ORDER — INSULIN ASPART 100 UNIT/ML ~~LOC~~ SOLN
0.0000 [IU] | Freq: Three times a day (TID) | SUBCUTANEOUS | Status: DC
  Administered 2017-01-27: 2 [IU] via SUBCUTANEOUS
  Administered 2017-01-27: 1 [IU] via SUBCUTANEOUS
  Administered 2017-01-28: 7 [IU] via SUBCUTANEOUS
  Administered 2017-01-28: 3 [IU] via SUBCUTANEOUS
  Administered 2017-01-28: 9 [IU] via SUBCUTANEOUS

## 2017-01-26 MED ORDER — CALCIUM ACETATE (PHOS BINDER) 667 MG PO CAPS
1334.0000 mg | ORAL_CAPSULE | Freq: Three times a day (TID) | ORAL | Status: DC
  Administered 2017-01-26 – 2017-01-29 (×6): 1334 mg via ORAL
  Filled 2017-01-26 (×7): qty 2

## 2017-01-26 MED ORDER — HEPARIN SODIUM (PORCINE) 1000 UNIT/ML DIALYSIS
20.0000 [IU]/kg | INTRAMUSCULAR | Status: DC | PRN
  Administered 2017-01-26: 1200 [IU] via INTRAVENOUS_CENTRAL

## 2017-01-26 MED ORDER — ONDANSETRON HCL 4 MG/2ML IJ SOLN
4.0000 mg | Freq: Once | INTRAMUSCULAR | Status: AC
  Administered 2017-01-26: 4 mg via INTRAVENOUS

## 2017-01-26 MED ORDER — DEXTROSE 50 % IV SOLN
25.0000 mL | Freq: Once | INTRAVENOUS | Status: AC
  Administered 2017-01-26: 25 mL via INTRAVENOUS
  Filled 2017-01-26: qty 50

## 2017-01-26 MED ORDER — ONDANSETRON HCL 4 MG PO TABS: 4.0000 mg | ORAL_TABLET | Freq: Four times a day (QID) | ORAL | Status: DC | PRN

## 2017-01-26 MED ORDER — HEPARIN SODIUM (PORCINE) 5000 UNIT/ML IJ SOLN: 5000.0000 [IU] | Freq: Three times a day (TID) | INTRAMUSCULAR | Status: DC

## 2017-01-26 MED ORDER — PREDNISOLONE ACETATE 1 % OP SUSP
1.0000 [drp] | Freq: Four times a day (QID) | OPHTHALMIC | Status: DC
  Administered 2017-01-27 – 2017-01-28 (×6): 1 [drp] via OPHTHALMIC
  Filled 2017-01-26: qty 1

## 2017-01-26 MED ORDER — AMLODIPINE BESYLATE 5 MG PO TABS
2.5000 mg | ORAL_TABLET | Freq: Every day | ORAL | Status: DC
  Administered 2017-01-26 – 2017-01-28 (×3): 2.5 mg via ORAL
  Filled 2017-01-26 (×3): qty 1

## 2017-01-26 MED ORDER — INSULIN GLARGINE 100 UNIT/ML ~~LOC~~ SOLN: 10.0000 [IU] | Freq: Every day | SUBCUTANEOUS | Status: DC

## 2017-01-26 NOTE — Progress Notes (Addendum)
Inpatient Diabetes Program Recommendations  AACE/ADA: New Consensus Statement on Inpatient Glycemic Control (2015)  Target Ranges:  Prepandial:   less than 140 mg/dL      Peak postprandial:   less than 180 mg/dL (1-2 hours)      Critically ill patients:  140 - 180 mg/dL   Results for [name], [name] (MRN [id number]) as of 01/26/2017 10:12  Ref. Range 01/25/2017 18:56 01/25/2017 23:26 01/26/2017 04:26 01/26/2017 04:59 01/26/2017 05:40 01/26/2017 06:20 01/26/2017 06:59 01/26/2017 09:13  Glucose-Capillary Latest Ref Range: 65 - 99 mg/dL 92 194 (H) 37 (LL) 52 (L) 38 (LL) 103 (H) 244 (H) 75   Review of Glycemic Control  Diabetes history: DM 1 Outpatient Diabetes medications: Lantus 14 units, Novolog 2-10 units tid starting at 140 mg/dl Current orders for Inpatient glycemic control: Novolog Sensitive Correction 0-9 units tid  Inpatient Diabetes Program Recommendations:    DM type 1. Consider reordering a portion of Lantus 6-7 units (0.1 unit/kg) QHS starting tonight.  Thanks,  Tama Headings RN, MSN, [facility] Inpatient Diabetes Coordinator Team Pager [phone] (8a-5p)

## 2017-01-26 NOTE — ED Notes (Signed)
Pt's CBG - 75-- states that her sugar drops quickly when under 90-- admitting dr paged x 2,

## 2017-01-26 NOTE — ED Notes (Signed)
RN rechecked patients blood glucose and noted it was still low. Pt was given more juice to drink and instructed that her sugar would be checked in about a half hour

## 2017-01-26 NOTE — ED Notes (Signed)
Dr. Broadus John paged at [phone]

## 2017-01-26 NOTE — ED Notes (Signed)
Report called to [facility], RN on 6  Est-- pt to go to IR first.

## 2017-01-26 NOTE — Progress Notes (Signed)
HD tx initiated via HD cath w/o problem after waiting on portable CXRAy cath placement verification to be done, pull/push/flush equally w/o problem VSS, will cont to monitor while on HD tx

## 2017-01-26 NOTE — H&P (Signed)
History and Physical    [name] YTK:[id number] DOB: October 11, 1990 DOA: 01/25/2017  PCP: System, Pcp Not In  Patient coming from: Home  I have personally briefly reviewed patient's old medical records in [place]  Chief Complaint: Cough  HPI: [name] is a 26 y.o. female with medical history significant of DM1, CKD stage 4-5, just had fistula placed at end of July.  On kidney transplant list at [facility].  Patient presents to the ED with c/o cough and wheezing.  Symptoms onset yesterday.  Painful and productive cough.  No fevers nor chills.  Hasnt taken anything for symptoms.   ED Course: CXR shows "multilobar pneumonia".  Patient has no SIRS, did get dose of IV levaquin in ED.  Concern for fluid overload.  Creat is now 8.78.  HGB 6.6 down from 8.0 on 6/1 at [facility]  Attempt made to transfer [facility] to [facility] but [facility] is full and declines transfer.   Review of Systems: As per HPI otherwise 10 point review of systems negative.   Past Medical History:  Diagnosis Date  . Cardiac arrest ([place])   . Diabetes mellitus type 1 ([place])    2005 dx [facility]  . DKA (diabetic ketoacidoses) [facility])     Past Surgical History:  Procedure Laterality Date  . CATARACT EXTRACTION     6 years ago     reports that she has never smoked. She has never used smokeless tobacco. She reports that she does not drink alcohol or use drugs.  No Known Allergies  Family History  Problem Relation Age of Onset  . Diabetes Maternal Grandmother   . Stroke Maternal Grandmother   . Cancer Maternal Aunt        aunt, passed last month, ?RCC  . Cancer Maternal Uncle      Prior to Admission medications   Medication Sig Start Date End Date Taking? Authorizing Provider  amLODipine (NORVASC) 2.5 MG tablet Take 2.5 mg by mouth daily.   Yes [provider]  glucagon, human recombinant, (GLUCAGEN DIAGNOSTIC) 1 MG injection Inject 1 mg into the vein once as needed for low blood sugar.    Yes [provider]  ibuprofen (ADVIL,MOTRIN) 200 MG tablet Take 400 mg by mouth every 6 (six) hours as needed for headache or cramping.    Yes [provider]  insulin aspart (NOVOLOG) 100 UNIT/ML injection Before each meal 3 times a day, 140-199 - 2 units, 200-250 - 4 units, 251-299 - 6 units,  300-349 - 8 units,  350 or above 10 units. Insulin PEN if approved, provide syringes and needles if needed. 04/30/14  Yes Advani, Vernon Prey, MD  insulin glargine (LANTUS) 100 UNIT/ML injection Inject 0.25 mLs (25 Units total) into the skin at bedtime. Patient taking differently: Inject 14 Units into the skin at bedtime.  04/30/14  Yes Advani, Vernon Prey, MD  ketorolac (ACULAR) 0.5 % ophthalmic solution Place 1 drop into both eyes 4 (four) times daily.   Yes [provider]  metoprolol succinate (TOPROL-XL) 25 MG 24 hr tablet Take 25 mg by mouth daily.   Yes [provider]  norethindrone (MICRONOR,CAMILA,ERRIN) 0.35 MG tablet Take 1 tablet by mouth daily.   Yes [provider]  prednisoLONE acetate (PRED FORTE) 1 % ophthalmic suspension Place 1 drop into both eyes 4 (four) times daily.   Yes [provider]  senna (SENOKOT) 8.6 MG tablet Take 1 tablet by mouth daily.   Yes [provider]  sodium  bicarbonate 650 MG tablet Take 1,300 mg by mouth 3 (three) times daily.   Yes [provider]    Physical Exam: Vitals:   01/26/17 0030 01/26/17 0045 01/26/17 0100 01/26/17 0115  BP: 135/86 (!) 129/92 130/86 137/90  Pulse: (!) 102 98 (!) 101 (!) 101  Resp: (!) 25 17 20  (!) 24  Temp:      TempSrc:      SpO2: 90% 94% 92% 91%  Weight:      Height:        Constitutional: NAD, calm, comfortable Eyes: PERRL, lids and conjunctivae normal ENMT: Mucous membranes are moist. Posterior pharynx clear of any exudate or lesions.Normal dentition.  Neck: normal, supple, no masses, no thyromegaly Respiratory: clear to auscultation bilaterally, no  wheezing, no crackles. Normal respiratory effort. No accessory muscle use.  Cardiovascular: Regular rate and rhythm, no murmurs / rubs / gallops. No extremity edema. 2+ pedal pulses. No carotid bruits.  Abdomen: no tenderness, no masses palpated. No hepatosplenomegaly. Bowel sounds positive.  Musculoskeletal: no clubbing / cyanosis. No joint deformity upper and lower extremities. Good ROM, no contractures. Normal muscle tone.  Skin: no rashes, lesions, ulcers. No induration Neurologic: CN 2-12 grossly intact. Sensation intact, DTR normal. Strength 5/5 in all 4.  Psychiatric: Normal judgment and insight. Alert and oriented x 3. Normal mood.    Labs on Admission: I have personally reviewed following labs and imaging studies  CBC:  Recent Labs Lab 01/25/17 2026 01/25/17 2114  WBC 6.7  --   NEUTROABS 4.9  --   HGB 6.6* 6.5*  HCT 20.3* 19.0*  MCV 83.9  --   PLT 284  --    Basic Metabolic Panel:  Recent Labs Lab 01/25/17 2026 01/25/17 2114  NA 140 138  K 5.5* 5.3*  CL 111 114*  CO2 17*  --   GLUCOSE 82 103*  BUN 47* 43*  CREATININE 8.78* 9.70*  CALCIUM 6.8*  --    GFR: Estimated Creatinine Clearance: 7.5 mL/min (A) (by C-G formula based on SCr of 9.7 mg/dL (H)). Liver Function Tests: No results for input(s): AST, ALT, ALKPHOS, BILITOT, PROT, ALBUMIN in the last 168 hours. No results for input(s): LIPASE, AMYLASE in the last 168 hours. No results for input(s): AMMONIA in the last 168 hours. Coagulation Profile: No results for input(s): INR, PROTIME in the last 168 hours. Cardiac Enzymes: No results for input(s): CKTOTAL, CKMB, CKMBINDEX, TROPONINI in the last 168 hours. BNP (last 3 results) No results for input(s): PROBNP in the last 8760 hours. HbA1C: No results for input(s): HGBA1C in the last 72 hours. CBG:  Recent Labs Lab 01/25/17 1856 01/25/17 2326 01/26/17 0426 01/26/17 0459 01/26/17 0540  GLUCAP 92 194* 37* 52* 38*   Lipid Profile: No results for  input(s): CHOL, HDL, LDLCALC, TRIG, CHOLHDL, LDLDIRECT in the last 72 hours. Thyroid Function Tests: No results for input(s): TSH, T4TOTAL, FREET4, T3FREE, THYROIDAB in the last 72 hours. Anemia Panel: No results for input(s): VITAMINB12, FOLATE, FERRITIN, TIBC, IRON, RETICCTPCT in the last 72 hours. Urine analysis:    Component Value Date/Time   COLORURINE YELLOW 04/07/2014 [place] 04/07/2014 1457   LABSPEC 1.025 04/07/2014 1457   PHURINE 5.0 04/07/2014 1457   GLUCOSEU >1000 (A) 04/07/2014 1457   HGBUR TRACE (A) 04/07/2014 1457   BILIRUBINUR NEGATIVE 04/07/2014 1457   KETONESUR 40 (A) 04/07/2014 1457   PROTEINUR NEGATIVE 04/07/2014 1457   UROBILINOGEN 0.2 04/07/2014 1457   NITRITE NEGATIVE 04/07/2014 1457  LEUKOCYTESUR NEGATIVE 04/07/2014 1457    Radiological Exams on Admission: Dg Chest 2 View  Result Date: 01/25/2017 CLINICAL DATA:  Productive cough x2 days. Left-sided chest pain. Dialysis patient. EXAM: CHEST  2 VIEW COMPARISON:  04/24/2014 FINDINGS: Slightly low lung volumes with mild cardiac enlargement. No aortic aneurysm. Patchy airspace disease in the left upper and both lower lobes with trace bilateral pleural effusions left greater than right. Mild interstitial edema is also noted. No acute osseous abnormality. IMPRESSION: 1. Findings suggest multilobar pneumonia more so the left upper and right lower lobes. 2. Trace bilateral pleural effusions, left greater than right. 3. Mild interstitial edema and cardiomegaly. Electronically Signed   By: Ashley Royalty M.D.   On: 01/25/2017 19:46    EKG: Independently reviewed.  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 Fluid overload, progression of CKD to ESRD likely needing initiation of dialysis - 1. Given lack of any SIRS, suspect  that SOB symptoms and CXR findings are due to fluid overload and not due to PNA. 1. Would confirm this by repeat CXR post dialysis to ensure resolution of infiltrate findings. 2. For now however, will hold off on ordering more. 3. Of note she did get a dose of IV levaquin in ED prior to my seeing her. 2. Nephrology to eval in AM, likely to get dialysis later today. 3. IR eval for dialysis access catheter for dialysis. 1. Had fistula creation on 7/25 but fistula still only stage 1, not mature and not ready for dialysis use yet. 4. Did attempt overnight to treat fluid overload with Lasix 160mg  IV x1.  800ccs total UOP from that though. 2. Normocytic normochromic anemia - strongly suspect anemia of CKD, no stigmata of GIB 1. Iron studies done in June were nl 2. Will attempt to further prove anemia of CKD by obtaining reticulocyte count and erythropoetin level 3. Likely warrants transfusion with dialysis given fluid overload / CHF 4. Have 2 units PRBC ordered to be prepared but not yet transfused. 3. DM1 - 1. Will reduce home lantus to 10 units QHS while inpatient (having low BGLs this AM after 14 units last night) 2. Sensitive SSI AC  DVT prophylaxis: Heparin [place] Code Status: Full Family Communication: No family in room Disposition Plan: Home after admit Consults called: Nephrology Admission status: Admit to inpatient   Etta Quill DO [facility] Pager [phone]  If 7AM-7PM, please contact day team taking care of patient [email or web address] Password [facility]  01/26/2017, 5:54 AM

## 2017-01-26 NOTE — ED Notes (Addendum)
Pt called out and told RN she felt like her sugar was low. RN checked CBG and result was 37. Pt A+O and was given some apple juice. Providers were notified of results and orders received. NAD noted

## 2017-01-26 NOTE — Progress Notes (Addendum)
PROGRESS NOTE    [name]  TML:[id number] DOB: [birth year] DOA: 01/25/2017 PCP: System, Pcp Not In  Brief Narrative:  [name] is a 26 y.o. female with medical history significant of DM1, CKD stage 4-5, just had fistula placed at end of July.  On kidney transplant list at [facility].  Patient presents to the ED with c/o cough and wheezing x1 day  ED Course: CXR shows "multilobar pneumonia".  Patient has no SIRS, Concern for fluid overload.  Creat  now 8.78.  HGB 6.6 down from 8.0 on 6/1 at [facility], no bed available to [facility] hence admitted at [place]:     CKD5/ Now ESRD  -Due to diabetic nephropathy most likely, presenting with volume overload and some symptoms of uremia  -Status post AV fistula on 7/25 at [facility] -Needs temporary HD catheter, patient is NPO, IR consulted -Renal consulting -Planned for HD after catheter placement -She is followed by a transplant team at [facility]  -She was given 1 dose of IV Lasix (160 mg) at 1:30 AM   ? 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, uncontrolled ([place]) -With hypoglycemia this morning we'll resume Lantus at half dose  -Sensitive sliding scale     Normocytic normochromic anemia -Due to iron deficiency and CKD5 -To get 2 units of blood on HD today  -Follow-up anemia panel  -No overt blood loss noted   DVT prophylaxis: heparin subcutaneous to start tonight  Code Status:  full code  Family Communication:No family at bedside Disposition Plan: Home in 3-4 days if stable  Consultants:   Renal, and IR    Procedures:  HD catheter placement pending  Subjective: Complaints of nausea, generalized weakness, shortness of breath Objective: Vitals:   01/26/17 0115 01/26/17 0630 01/26/17 0948 01/26/17 1059  BP: 137/90 127/82 137/89 (!) 152/92  Pulse: (!) 101 97 90 (!) 103  Resp: (!) 24 16 18      Temp:      TempSrc:      SpO2: 91% 97% 100%   Weight:      Height:        Intake/Output Summary (Last 24 hours) at 01/26/17 1139 Last data filed at 01/26/17 0503  Gross per 24 hour  Intake              546 ml  Output              800 ml  Net             -254 ml   Filed Weights   01/25/17 1428  Weight: 62.1 kg (137 lb)    Examination:  General exam: Appears calm and comfortable,  frail, ill-appearing Respiratory: basilar crackles noted Cardiovascular system: S1 & S2 hregular, tachycardic Gastrointestinal system: Abdomen is nondistended, soft and nontender.Normal bowel sounds heard. Central nervous system: Alert and oriented. No focal neurological deficits. Extremities: Symmetric 5 x 5 power, trace edema, left arm AV fistula noted  Skin: No rashes, lesions or ulcers Psychiatry: Judgement and insight appear normal. Mood & affect appropriate.     Data Reviewed:   CBC:  Recent Labs Lab 01/25/17 2026 01/25/17 2114 01/26/17 0706  WBC 6.7  --  6.1  NEUTROABS 4.9  --   --   HGB 6.6* 6.5* 6.0*  HCT 20.3* 19.0* 18.5*  MCV 83.9  --  84.5  PLT 284  --  357   Basic Metabolic Panel:  Recent Labs Lab 01/25/17 2026 01/25/17 2114 01/26/17 0706  NA 140 138 138  K 5.5* 5.3* 5.3*  CL 111 114* 110  CO2 17*  --  18*  GLUCOSE 82 103* 67  BUN 47* 43* 48*  CREATININE 8.78* 9.70* 9.03*  CALCIUM 6.8*  --  6.6*   GFR: Estimated Creatinine Clearance: 8 mL/min (A) (by C-G formula based on SCr of 9.03 mg/dL (H)). Liver Function Tests: No results for input(s): AST, ALT, ALKPHOS, BILITOT, PROT, ALBUMIN in the last 168 hours. No results for input(s): LIPASE, AMYLASE in the last 168 hours. No results for input(s): AMMONIA in the last 168 hours. Coagulation Profile:  Recent Labs Lab 01/26/17 1040  INR 1.18   Cardiac Enzymes: No results for input(s): CKTOTAL, CKMB, CKMBINDEX, TROPONINI in the last 168 hours. BNP (last 3 results) No results for input(s): PROBNP in the last  8760 hours. HbA1C: No results for input(s): HGBA1C in the last 72 hours. CBG:  Recent Labs Lab 01/26/17 0540 01/26/17 0620 01/26/17 0659 01/26/17 0913 01/26/17 1021  GLUCAP 38* 103* 244* 75 68   Lipid Profile: No results for input(s): CHOL, HDL, LDLCALC, TRIG, CHOLHDL, LDLDIRECT in the last 72 hours. Thyroid Function Tests: No results for input(s): TSH, T4TOTAL, FREET4, T3FREE, THYROIDAB in the last 72 hours. Anemia Panel:  Recent Labs  01/26/17 0526  RETICCTPCT 1.5   Urine analysis:    Component Value Date/Time   COLORURINE YELLOW 04/07/2014 [place] 04/07/2014 1457   LABSPEC 1.025 04/07/2014 1457   PHURINE 5.0 04/07/2014 1457   GLUCOSEU >1000 (A) 04/07/2014 1457   HGBUR TRACE (A) 04/07/2014 1457   BILIRUBINUR NEGATIVE 04/07/2014 1457   KETONESUR 40 (A) 04/07/2014 1457   PROTEINUR NEGATIVE 04/07/2014 1457   UROBILINOGEN 0.2 04/07/2014 1457   NITRITE NEGATIVE 04/07/2014 1457   LEUKOCYTESUR NEGATIVE 04/07/2014 1457   Sepsis Labs: @LABRCNTIP (procalcitonin:4,lacticidven:4)  )No results found for this or any previous visit (from the past 240 hour(s)).       Radiology Studies: Dg Chest 2 View  Result Date: 01/25/2017 CLINICAL DATA:  Productive cough x2 days. Left-sided chest pain. Dialysis patient. EXAM: CHEST  2 VIEW COMPARISON:  04/24/2014 FINDINGS: Slightly low lung volumes with mild cardiac enlargement. No aortic aneurysm. Patchy airspace disease in the left upper and both lower lobes with trace bilateral pleural effusions left greater than right. Mild interstitial edema is also noted. No acute osseous abnormality. IMPRESSION: 1. Findings suggest multilobar pneumonia more so the left upper and right lower lobes. 2. Trace bilateral pleural effusions, left greater than right. 3. Mild interstitial edema and cardiomegaly. Electronically Signed   By: Ashley Royalty M.D.   On: 01/25/2017 19:46        Scheduled Meds: . amLODipine  2.5 mg Oral Daily    . heparin  5,000 Units Subcutaneous Q8H  . insulin aspart  0-9 Units Subcutaneous TID WC  . ketorolac  1 drop Both Eyes QID  . metoprolol succinate  25 mg Oral Daily  . norethindrone  1 tablet Oral Daily  . ondansetron      . prednisoLONE acetate  1 drop Both Eyes QID  . senna  1 tablet Oral Daily  . sodium bicarbonate  1,300 mg Oral TID   Continuous Infusions: . sodium chloride    .  ceFAZolin (ANCEF) IV       LOS: 0 days    Time spent: 39min  Domenic Polite, MD [facility] Pager [phone]  If 7PM-7AM, please contact night-coverage [email or web address] Password [facility] 01/26/2017, 11:39 AM

## 2017-01-26 NOTE — Consult Note (Signed)
Chief Complaint: renal failure  Referring Physician:Dr. Jennette Kettle  Supervising Physician: Jacqulynn Cadet  Patient Status: [facility] - In-pt  HPI: [name] is a 26 y.o. female with a history of DM1 with subsequent CKD who has had a fistula placed on July 25th that has not matured yet.  She presents to the ED today with SOB and "hearing the fluid in my lungs."  She is found to be fluid overloaded and in acute on chronic renal failure, likely resulting in ESRD and needing to start dialysis.  We have been asked to see the patient to place a permanent HD cath so she can start HD while her fistula has yet to mature.  Past Medical History:  Past Medical History:  Diagnosis Date  . Cardiac arrest ([place])   . Diabetes mellitus type 1 ([place])    2005 dx [facility]  . DKA (diabetic ketoacidoses) [facility])     Past Surgical History:  Past Surgical History:  Procedure Laterality Date  . CATARACT EXTRACTION     6 years ago    Family History:  Family History  Problem Relation Age of Onset  . Diabetes Maternal Grandmother   . Stroke Maternal Grandmother   . Cancer Maternal Aunt        aunt, passed last month, ?RCC  . Cancer Maternal Uncle     Social History:  reports that she has never smoked. She has never used smokeless tobacco. She reports that she does not drink alcohol or use drugs.  Allergies: No Known Allergies  Medications: Medications reviewed in epic  Please HPI for pertinent positives, otherwise complete 10 system ROS negative.  Mallampati Score: MD Evaluation Airway: WNL Heart: WNL Abdomen: WNL Chest/ Lungs: WNL ASA  Classification: 3 Mallampati/Airway Score: One  Physical Exam: BP 137/89 (BP Location: Right Arm)   Pulse 90   Temp 98.1 F (36.7 C) (Oral)   Resp 18   Ht '5\' 1"'  (1.549 m)   Wt 137 lb (62.1 kg)   SpO2 100%   BMI 25.89 kg/m  Body mass index is 25.89 kg/m. General: pleasant, WD, WN black female who is laying in bed in NAD HEENT:  head is normocephalic, atraumatic.  Sclera are noninjected.  PERRL.  Ears and nose without any masses or lesions.  Mouth is pink and moist Heart: regular rhythm, slightly tachy.  Normal s1,s2. No obvious murmurs, gallops, or rubs noted.  Palpable radial pulses bilaterally Lungs: few rhonchi noted bilaterally.  Respiratory effort nonlabored Abd: soft, NT, ND, +BS, no masses, hernias, or organomegaly Psych: A&Ox3 with an appropriate affect.   Labs: Results for orders placed or performed during the hospital encounter of 01/25/17 (from the past 48 hour(s))  CBG monitoring, ED     Status: None   Collection Time: 01/25/17  6:56 PM  Result Value Ref Range   Glucose-Capillary 92 65 - 99 mg/dL  CBC with Differential/Platelet     Status: Abnormal   Collection Time: 01/25/17  8:26 PM  Result Value Ref Range   WBC 6.7 4.0 - 10.5 K/uL   RBC 2.42 (L) 3.87 - 5.11 MIL/uL   Hemoglobin 6.6 (LL) 12.0 - 15.0 g/dL    Comment: REPEATED TO VERIFY CRITICAL RESULT CALLED TO, READ BACK BY AND VERIFIED WITH: P.OBAMA,RN 2101 01/25/17 M.CAMPBELL    HCT 20.3 (L) 36.0 - 46.0 %   MCV 83.9 78.0 - 100.0 fL   MCH 27.3 26.0 - 34.0 pg   MCHC 32.5 30.0 - 36.0  g/dL   RDW 12.8 11.5 - 15.5 %   Platelets 284 150 - 400 K/uL   Neutrophils Relative % 73 %   Neutro Abs 4.9 1.7 - 7.7 K/uL   Lymphocytes Relative 20 %   Lymphs Abs 1.3 0.7 - 4.0 K/uL   Monocytes Relative 5 %   Monocytes Absolute 0.3 0.1 - 1.0 K/uL   Eosinophils Relative 2 %   Eosinophils Absolute 0.1 0.0 - 0.7 K/uL   Basophils Relative 0 %   Basophils Absolute 0.0 0.0 - 0.1 K/uL  Basic metabolic panel     Status: Abnormal   Collection Time: 01/25/17  8:26 PM  Result Value Ref Range   Sodium 140 135 - 145 mmol/L   Potassium 5.5 (H) 3.5 - 5.1 mmol/L   Chloride 111 101 - 111 mmol/L   CO2 17 (L) 22 - 32 mmol/L   Glucose, Bld 82 65 - 99 mg/dL   BUN 47 (H) 6 - 20 mg/dL   Creatinine, Ser 8.78 (H) 0.44 - 1.00 mg/dL   Calcium 6.8 (L) 8.9 - 10.3 mg/dL   GFR  calc non Af Amer 6 (L) >60 mL/min   GFR calc Af Amer 7 (L) >60 mL/min    Comment: (NOTE) The eGFR has been calculated using the CKD EPI equation. This calculation has not been validated in all clinical situations. eGFR's persistently <60 mL/min signify possible Chronic Kidney Disease.    Anion gap 12 5 - 15  Brain natriuretic peptide     Status: Abnormal   Collection Time: 01/25/17  8:26 PM  Result Value Ref Range   B Natriuretic Peptide 944.1 (H) 0.0 - 100.0 pg/mL  I-Stat beta hCG blood, ED     Status: None   Collection Time: 01/25/17  8:30 PM  Result Value Ref Range   I-stat hCG, quantitative <5.0 <5 mIU/mL   Comment 3            Comment:   GEST. AGE      CONC.  (mIU/mL)   <=1 WEEK        5 - 50     2 WEEKS       50 - 500     3 WEEKS       100 - 10,000     4 WEEKS     1,000 - 30,000        FEMALE AND NON-PREGNANT FEMALE:     LESS THAN 5 mIU/mL   I-stat chem 8, ed     Status: Abnormal   Collection Time: 01/25/17  9:14 PM  Result Value Ref Range   Sodium 138 135 - 145 mmol/L   Potassium 5.3 (H) 3.5 - 5.1 mmol/L   Chloride 114 (H) 101 - 111 mmol/L   BUN 43 (H) 6 - 20 mg/dL   Creatinine, Ser 9.70 (H) 0.44 - 1.00 mg/dL   Glucose, Bld 103 (H) 65 - 99 mg/dL   Calcium, Ion 0.73 (LL) 1.15 - 1.40 mmol/L   TCO2 13 0 - 100 mmol/L   Hemoglobin 6.5 (LL) 12.0 - 15.0 g/dL   HCT 19.0 (L) 36.0 - 46.0 %   Comment NOTIFIED PHYSICIAN   Type and screen     Status: None (Preliminary result)   Collection Time: 01/25/17  9:23 PM  Result Value Ref Range   ABO/RH(D) B POS    Antibody Screen NEG    Sample Expiration 01/28/2017    Unit Number [id number]    Blood Component Type RED  CELLS,LR    Unit division 00    Status of Unit ALLOCATED    Transfusion Status OK TO TRANSFUSE    Crossmatch Result Compatible    Unit Number [id number]    Blood Component Type RED CELLS,LR    Unit division 00    Status of Unit ALLOCATED    Transfusion Status OK TO TRANSFUSE    Crossmatch Result  Compatible   CBG monitoring, ED     Status: Abnormal   Collection Time: 01/25/17 11:26 PM  Result Value Ref Range   Glucose-Capillary 194 (H) 65 - 99 mg/dL  CBG monitoring, ED     Status: Abnormal   Collection Time: 01/26/17  4:26 AM  Result Value Ref Range   Glucose-Capillary 37 (LL) 65 - 99 mg/dL  CBG monitoring, ED     Status: Abnormal   Collection Time: 01/26/17  4:59 AM  Result Value Ref Range   Glucose-Capillary 52 (L) 65 - 99 mg/dL  Reticulocytes     Status: Abnormal   Collection Time: 01/26/17  5:26 AM  Result Value Ref Range   Retic Ct Pct 1.5 0.4 - 3.1 %   RBC. 2.19 (L) 3.87 - 5.11 MIL/uL   Retic Count, Absolute 32.9 19.0 - 186.0 K/uL  CBG monitoring, ED     Status: Abnormal   Collection Time: 01/26/17  5:40 AM  Result Value Ref Range   Glucose-Capillary 38 (LL) 65 - 99 mg/dL  CBG monitoring, ED     Status: Abnormal   Collection Time: 01/26/17  6:20 AM  Result Value Ref Range   Glucose-Capillary 103 (H) 65 - 99 mg/dL  Prepare RBC     Status: None   Collection Time: 01/26/17  6:30 AM  Result Value Ref Range   Order Confirmation ORDER PROCESSED BY BLOOD BANK   CBG monitoring, ED     Status: Abnormal   Collection Time: 01/26/17  6:59 AM  Result Value Ref Range   Glucose-Capillary 244 (H) 65 - 99 mg/dL  Basic metabolic panel     Status: Abnormal   Collection Time: 01/26/17  7:06 AM  Result Value Ref Range   Sodium 138 135 - 145 mmol/L   Potassium 5.3 (H) 3.5 - 5.1 mmol/L   Chloride 110 101 - 111 mmol/L   CO2 18 (L) 22 - 32 mmol/L   Glucose, Bld 67 65 - 99 mg/dL   BUN 48 (H) 6 - 20 mg/dL   Creatinine, Ser 9.03 (H) 0.44 - 1.00 mg/dL   Calcium 6.6 (L) 8.9 - 10.3 mg/dL   GFR calc non Af Amer 5 (L) >60 mL/min   GFR calc Af Amer 6 (L) >60 mL/min    Comment: (NOTE) The eGFR has been calculated using the CKD EPI equation. This calculation has not been validated in all clinical situations. eGFR's persistently <60 mL/min signify possible Chronic Kidney Disease.     Anion gap 10 5 - 15  CBC     Status: Abnormal   Collection Time: 01/26/17  7:06 AM  Result Value Ref Range   WBC 6.1 4.0 - 10.5 K/uL   RBC 2.19 (L) 3.87 - 5.11 MIL/uL   Hemoglobin 6.0 (LL) 12.0 - 15.0 g/dL    Comment: REPEATED TO VERIFY CRITICAL VALUE NOTED.  VALUE IS CONSISTENT WITH PREVIOUSLY REPORTED AND CALLED VALUE.    HCT 18.5 (L) 36.0 - 46.0 %   MCV 84.5 78.0 - 100.0 fL   MCH 27.4 26.0 - 34.0 pg   MCHC 32.4  30.0 - 36.0 g/dL   RDW 13.4 11.5 - 15.5 %   Platelets 275 150 - 400 K/uL  CBG monitoring, ED     Status: None   Collection Time: 01/26/17  9:13 AM  Result Value Ref Range   Glucose-Capillary 75 65 - 99 mg/dL  CBG monitoring, ED     Status: None   Collection Time: 01/26/17 10:21 AM  Result Value Ref Range   Glucose-Capillary 68 65 - 99 mg/dL    Imaging: Dg Chest 2 View  Result Date: 01/25/2017 CLINICAL DATA:  Productive cough x2 days. Left-sided chest pain. Dialysis patient. EXAM: CHEST  2 VIEW COMPARISON:  04/24/2014 FINDINGS: Slightly low lung volumes with mild cardiac enlargement. No aortic aneurysm. Patchy airspace disease in the left upper and both lower lobes with trace bilateral pleural effusions left greater than right. Mild interstitial edema is also noted. No acute osseous abnormality. IMPRESSION: 1. Findings suggest multilobar pneumonia more so the left upper and right lower lobes. 2. Trace bilateral pleural effusions, left greater than right. 3. Mild interstitial edema and cardiomegaly. Electronically Signed   By: Ashley Royalty M.D.   On: 01/25/2017 19:46    Assessment/Plan 1. Acute on chronic renal failure  We will plan to place a tunneled HD catheter today so she can start HD.  She last ate at 0430 this am.  We can plan to do this after 1030am today.  Her PT/INR is pending, otherwise her labs have been reviewed. Risks and benefits discussed with the patient including, but not limited to bleeding, infection, vascular injury, pneumothorax which may require chest  tube placement, air embolism or even death All of the patient's questions were answered, patient is agreeable to proceed. Consent signed and in chart.  Thank you for this interesting consult.  I greatly enjoyed meeting Ellice B Gasbarro and look forward to participating in their care.  A copy of this report was sent to the requesting provider on this date.  Electronically Signed: Henreitta Cea 01/26/2017, 10:33 AM   I spent a total of 40 Minutes    in face to face in clinical consultation, greater than 50% of which was counseling/coordinating care for renal failure

## 2017-01-26 NOTE — ED Notes (Signed)
Dr. Broadus John returned pg-- orders received.

## 2017-01-26 NOTE — Progress Notes (Signed)
CRITICAL VALUE ALERT  Critical Value:  CBG 53  Date & Time Notied:  01/25/2017 @ 1700  Provider Notified: Dr Broadus John   Orders Received/Actions taken: YES.

## 2017-01-26 NOTE — Progress Notes (Addendum)
Patient was received from ED, report given by Rolene Arbour RN. Patient arrived to floor with family at bedside. No c/o pain or discomfort noted. Will continue to monitor patient. Arrived to 6E-23.

## 2017-01-26 NOTE — Consult Note (Addendum)
[facility] Consultation Note Requesting Physician:  Dr. Alcario Drought Primary Nephrologist: Dr. Duane Boston Reason for Consult:  Start HD  HPI: The patient is a 25 y.o. 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  On CXR there was concern for fluid overload vs pneumonia. Ptient received one dose of Levaquin in the ED. Creatinine was 8.7 and Hgb 6.6 on admission. IR was consulted for permanent HD catheter to start dialysis as her AVF matures.  Pt from [place] but will be residing in [place], will be attending [employer], so will need HD locally. Says most of her classes are on MWF. Normally follows with [facility] (Dr. Tamala Julian, Dr. Lowella Petties)   Creatinine, Ser  Date/Time Value Ref Range Status  01/26/2017 07:06 AM 9.03 (H) 0.44 - 1.00 mg/dL Final  01/25/2017 09:14 PM 9.70 (H) 0.44 - 1.00 mg/dL Final  01/25/2017 08:26 PM 8.78 (H) 0.44 - 1.00 mg/dL Final  04/14/2014 04:58 AM 1.03 0.50 - 1.10 mg/dL Final  04/13/2014 12:23 PM 1.22 (H) 0.50 - 1.10 mg/dL Final  04/12/2014 03:18 PM 1.12 (H) 0.50 - 1.10 mg/dL Final  04/11/2014 02:13 PM 1.20 (H) 0.50 - 1.10 mg/dL Final  04/11/2014 08:13 AM 1.19 (H) 0.50 - 1.10 mg/dL Final  04/11/2014 04:00 AM 1.24 (H) 0.50 - 1.10 mg/dL Final  04/10/2014 09:47 PM 1.34 (H) 0.50 - 1.10 mg/dL Final  04/10/2014 07:13 PM 1.38 (H) 0.50 - 1.10 mg/dL Final  04/10/2014 05:05 PM 1.40 (H) 0.50 - 1.10 mg/dL Final  04/10/2014 09:31 AM 1.43 (H) 0.50 - 1.10 mg/dL Final  04/10/2014 04:20 AM 1.45 (H) 0.50 - 1.10 mg/dL Final  04/09/2014 05:16 PM 1.49 (H) 0.50 - 1.10 mg/dL Final  04/09/2014 09:55 AM 1.61 (H) 0.50 - 1.10 mg/dL Final  04/09/2014 06:00 AM 1.65 (H) 0.50 - 1.10 mg/dL Final  04/09/2014 02:00 AM 1.71 (H) 0.50 - 1.10 mg/dL Final  04/08/2014 10:00 PM 1.76 (H) 0.50 - 1.10 mg/dL Final  04/08/2014 06:17 PM 1.89 (H) 0.50 - 1.10 mg/dL Final  04/08/2014 02:00 PM 1.94 (H) 0.50 -  1.10 mg/dL Final  04/08/2014 09:38 AM 2.27 (H) 0.50 - 1.10 mg/dL Final  04/08/2014 04:00 AM 2.27 (H) 0.50 - 1.10 mg/dL Final  04/08/2014 02:00 AM 2.33 (H) 0.50 - 1.10 mg/dL Final  04/07/2014 11:14 PM 2.41 (H) 0.50 - 1.10 mg/dL Final  04/07/2014 08:15 PM 2.53 (H) 0.50 - 1.10 mg/dL Final  04/07/2014 07:41 PM 2.70 (H) 0.50 - 1.10 mg/dL Final  04/07/2014 05:40 PM 2.57 (H) 0.50 - 1.10 mg/dL Final  04/07/2014 03:54 PM 2.56 (H) 0.50 - 1.10 mg/dL Final  04/07/2014 02:36 PM 3.11 (H) 0.50 - 1.10 mg/dL Final  04/07/2014 02:33 PM 3.40 (H) 0.50 - 1.10 mg/dL Final  02/15/2014 01:05 AM 1.18 (H) 0.50 - 1.10 mg/dL Final    Past Medical History:  Diagnosis Date  . Cardiac arrest ([place])   . Diabetes mellitus type 1 ([place])    2005 dx [facility]  . DKA (diabetic ketoacidoses) [facility])     Past Surgical History:  Procedure Laterality Date  . CATARACT EXTRACTION     6 years ago    Family History  Problem Relation Age of Onset  . Diabetes Maternal Grandmother   . Stroke Maternal Grandmother   . Cancer Maternal Aunt        aunt, passed last month, ?RCC  . Cancer Maternal Uncle    Social  History:  reports that she has never smoked. She has never used smokeless tobacco. She reports that she does not drink alcohol or use drugs.  Allergies: No Known Allergies  Home medications: Prior to Admission medications   Medication Sig Start Date End Date Taking? Authorizing Provider  amLODipine (NORVASC) 2.5 MG tablet Take 2.5 mg by mouth daily.   Yes [provider]  glucagon, human recombinant, (GLUCAGEN DIAGNOSTIC) 1 MG injection Inject 1 mg into the vein once as needed for low blood sugar.   Yes [provider]  ibuprofen (ADVIL,MOTRIN) 200 MG tablet Take 400 mg by mouth every 6 (six) hours as needed for headache or cramping.    Yes [provider]  insulin aspart (NOVOLOG) 100 UNIT/ML injection Before each meal 3 times a day, 140-199 - 2 units, 200-250 - 4 units, 251-299 - 6  units,  300-349 - 8 units,  350 or above 10 units. Insulin PEN if approved, provide syringes and needles if needed. 04/30/14  Yes Advani, Vernon Prey, MD  insulin glargine (LANTUS) 100 UNIT/ML injection Inject 0.25 mLs (25 Units total) into the skin at bedtime. Patient taking differently: Inject 14 Units into the skin at bedtime.  04/30/14  Yes Advani, Vernon Prey, MD  ketorolac (ACULAR) 0.5 % ophthalmic solution Place 1 drop into both eyes 4 (four) times daily.   Yes [provider]  metoprolol succinate (TOPROL-XL) 25 MG 24 hr tablet Take 25 mg by mouth daily.   Yes [provider]  norethindrone (MICRONOR,CAMILA,ERRIN) 0.35 MG tablet Take 1 tablet by mouth daily.   Yes [provider]  prednisoLONE acetate (PRED FORTE) 1 % ophthalmic suspension Place 1 drop into both eyes 4 (four) times daily.   Yes [provider]  senna (SENOKOT) 8.6 MG tablet Take 1 tablet by mouth daily.   Yes [provider]  sodium bicarbonate 650 MG tablet Take 1,300 mg by mouth 3 (three) times daily.   Yes [provider]    Inpatient medications: . amLODipine  2.5 mg Oral Daily  . heparin  5,000 Units Subcutaneous Q8H  . insulin aspart  0-9 Units Subcutaneous TID WC  . insulin glargine  5 Units Subcutaneous QHS  . ketorolac  1 drop Both Eyes QID  . metoprolol succinate  25 mg Oral Daily  . ondansetron      . prednisoLONE acetate  1 drop Both Eyes QID  . senna  1 tablet Oral Daily  . sodium bicarbonate  1,300 mg Oral TID    Review of Systems Gen:  Denies headache, fever, chills, sweats.  No weight loss. HEENT:  No visual change, sore throat, difficulty swallowing. Resp:  DOE.  With productive cough  Cardiac:  No chest pain.  Denies edema. Has orthopnea GI:   Denies abdominal pain.   No nausea, vomiting, diarrhea.  No constipation. GU:  Denies difficulty or change in voiding.  No change in urine color.     MS:  Denies joint pain or swelling.   Derm:  Denies skin  rash or itching.  No chronic skin conditions.  Neuro:   Denies focal weakness, memory problems, hx stroke or TIA.   Psych:  Denies symptoms of depression of anxiety.  No hallucination.    Physical Exam:  Blood pressure (!) 152/92, pulse (!) 103, temperature 98.1 F (36.7 C), temperature source Oral, resp. rate 18, height 5\' 1"  (1.549 m), weight 137 lb (62.1 kg), SpO2 100 %.  Gen: No acute distress  Lines/tubes: Skin: no  rash, cyanosis Neck: no JVD, no bruits or LAN Chest: Bibasilar crackles  Heart: regular, no rub or gallop Abdomen: soft, non tender Ext: no bilateral lower extremity edema Neuro: alert, Ox3, no focal deficit Heme/Lymph: no bruising or LAN Dialysis Access: immature AVF in left arm  Labs:  Recent Labs Lab 01/25/17 2026 01/25/17 2114 01/26/17 0706  NA 140 138 138  K 5.5* 5.3* 5.3*  CL 111 114* 110  CO2 17*  --  18*  GLUCOSE 82 103* 67  BUN 47* 43* 48*  CREATININE 8.78* 9.70* 9.03*  CALCIUM 6.8*  --  6.6*    Recent Labs Lab 01/25/17 2026 01/25/17 2114 01/26/17 0706  WBC 6.7  --  6.1  NEUTROABS 4.9  --   --   HGB 6.6* 6.5* 6.0*  HCT 20.3* 19.0* 18.5*  MCV 83.9  --  84.5  PLT 284  --  275     Recent Labs Lab 01/26/17 0620 01/26/17 0659 01/26/17 0913 01/26/17 1021 01/26/17 1306  GLUCAP 103* 244* 75 68 72    Xrays/Other Studies: Dg Chest 2 View  Result Date: 01/25/2017 CLINICAL DATA:  Productive cough x2 days. Left-sided chest pain. Dialysis patient. EXAM: CHEST  2 VIEW COMPARISON:  04/24/2014 FINDINGS: Slightly low lung volumes with mild cardiac enlargement. No aortic aneurysm. Patchy airspace disease in the left upper and both lower lobes with trace bilateral pleural effusions left greater than right. Mild interstitial edema is also noted. No acute osseous abnormality. IMPRESSION: 1. Findings suggest multilobar pneumonia more so the left upper and right lower lobes. 2. Trace bilateral pleural effusions, left greater than right. 3. Mild  interstitial edema and cardiomegaly. Electronically Signed   By: Ashley Royalty M.D.   On: 01/25/2017 19:46    Background:  Ms. 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  Patient follows with [facility] and is currently on transplant list. Patient had AVF placed 01/12/2017 in anticipation of dialysis as she had rapid progression of her disease between 03/18-6/18.  Creatinine had jumped from 3.8 to 6.4 then.  She presents with creatinine of 8 today and appears to be volume overloaded.   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   Currently creatinine is 9 and K 5.3.    Last creatinine on 11/19/2016 was 6.4.   IR was consulted for placement of permanent HD cath today so she can start HD while her fistula matures.   Will start dialysis today.   Patient is living locally and will be clipped to a [facility] unit  Electrolytes Hyperkalemic at 5.3, bicarb 17.   Patient receiving dialysis today.  Iron deficiency anemia Hemoglobin 6.  11/19/16 showed ferritin 184, iron 56, TIBC 277.   She has received IV iron outpatient.    Will need blood transfusion, 1 units.   Will obtain iron studies.   Will start aranesp 253mcg weekly.   Type I diabetes mellitus Follows with endocrinology at [facility].   On SSI-S  Bones Will get PTH and ionized calcium.   Will start phoslo.     Kalman Shan PGY-2 Internal medicine [phone] pager 01/26/2017, 1:12 PM   Agree with assessment and recommendations in the above note by Dr. Heber Alvo. Young AAF with DM1, CKD5 2/2 DM (followed by [facility] as her permanent residence  is in [place]) who has progressed to ESRD. Has L AVF just created 01/12/17 at [facility] Dr. Danne Baxter (stage 1 basilic vein transposition) so won't be ready for use for some  time. For [facility] by IR today, then 1st HD. Pt is student at [employer] will need to have HD set up locally. Hb 6 - for 1 unit blood. Check Fe studies, start Aranesp. Check PTH status. Add ca based phos binder for now (Ca 6.6, phos pending)  Jamal Maes, MD [facility] [phone] Pager 01/26/2017, 7:04 PM

## 2017-01-27 ENCOUNTER — Encounter (HOSPITAL_COMMUNITY): Payer: Self-pay | Admitting: Interventional Radiology

## 2017-01-27 DIAGNOSIS — D649 Anemia, unspecified: Secondary | ICD-10-CM

## 2017-01-27 DIAGNOSIS — Z992 Dependence on renal dialysis: Secondary | ICD-10-CM

## 2017-01-27 DIAGNOSIS — E8770 Fluid overload, unspecified: Secondary | ICD-10-CM

## 2017-01-27 LAB — RENAL FUNCTION PANEL
Albumin: 2.6 g/dL — ABNORMAL LOW (ref 3.5–5.0)
Anion gap: 13 (ref 5–15)
BUN: 27 mg/dL — ABNORMAL HIGH (ref 6–20)
CALCIUM: 8.2 mg/dL — AB (ref 8.9–10.3)
CO2: 20 mmol/L — AB (ref 22–32)
CREATININE: 6.54 mg/dL — AB (ref 0.44–1.00)
Chloride: 105 mmol/L (ref 101–111)
GFR, EST AFRICAN AMERICAN: 9 mL/min — AB (ref >60)
GFR, EST NON AFRICAN AMERICAN: 8 mL/min — AB (ref >60)
Glucose, Bld: 154 mg/dL — ABNORMAL HIGH (ref 65–99)
Phosphorus: 4.5 mg/dL (ref 2.5–4.6)
Potassium: 3.9 mmol/L (ref 3.5–5.1)
SODIUM: 138 mmol/L (ref 135–145)

## 2017-01-27 LAB — CBC
HCT: 24.7 % — ABNORMAL LOW (ref 36.0–46.0)
Hemoglobin: 7.9 g/dL — ABNORMAL LOW (ref 12.0–15.0)
MCH: 26.6 pg (ref 26.0–34.0)
MCHC: 32 g/dL (ref 30.0–36.0)
MCV: 83.2 fL (ref 78.0–100.0)
PLATELETS: 270 10*3/uL (ref 150–400)
RBC: 2.97 MIL/uL — AB (ref 3.87–5.11)
RDW: 13.7 % (ref 11.5–15.5)
WBC: 7 10*3/uL (ref 4.0–10.5)

## 2017-01-27 LAB — MRSA PCR SCREENING: MRSA BY PCR: NEGATIVE

## 2017-01-27 LAB — GLUCOSE, CAPILLARY
GLUCOSE-CAPILLARY: 127 mg/dL — AB (ref 65–99)
GLUCOSE-CAPILLARY: 152 mg/dL — AB (ref 65–99)
GLUCOSE-CAPILLARY: 171 mg/dL — AB (ref 65–99)
Glucose-Capillary: 160 mg/dL — ABNORMAL HIGH (ref 65–99)

## 2017-01-27 LAB — ERYTHROPOIETIN: Erythropoietin: 20.6 m[IU]/mL — ABNORMAL HIGH (ref 2.6–18.5)

## 2017-01-27 MED ORDER — HEPARIN SODIUM (PORCINE) 1000 UNIT/ML IJ SOLN
INTRAMUSCULAR | Status: DC | PRN
  Administered 2017-01-27: 3.2 mL

## 2017-01-27 MED ORDER — LIDOCAINE HCL 1 % IJ SOLN
INTRAMUSCULAR | Status: DC | PRN
  Administered 2017-01-27: 5 mL

## 2017-01-27 MED ORDER — NORETHINDRONE 0.35 MG PO TABS
1.0000 | ORAL_TABLET | Freq: Every day | ORAL | Status: DC
  Administered 2017-01-27 – 2017-01-28 (×2): 0.35 mg via ORAL
  Filled 2017-01-27: qty 1

## 2017-01-27 NOTE — Progress Notes (Signed)
[facility] Rounding Note  Subjective/Interval History:   Patient states she had a hemodialysis session yesterday without issues.  She states that her breathing has improved.  She states that she had some tightness in her lower back after dialysis that lasted for 1 hour.  She denies any back pain, chest pain or leg swelling.   Objective: Vital signs in last 24 hours:  Temp:  [98 F (36.7 C)-99.3 F (37.4 C)] 98.6 F (37 C) (08/09 0443) Pulse Rate:  [90-103] 96 (08/09 0443) Resp:  [16-23] 18 (08/09 0443) BP: (133-175)/(81-102) 148/81 (08/09 0443) SpO2:  [94 %-100 %] 98 % (08/09 0443) Weight:  [142 lb 10.2 oz (64.7 kg)-147 lb 7.8 oz (66.9 kg)] 142 lb 10.2 oz (64.7 kg) (08/09 0037) Weight change: 10 lb 7.8 oz (4.757 kg)  Intake/Output: I/O last 3 completed shifts: In: 1401 [P.O.:1020; Blood:315; IV Piggyback:66] Out: 5726 [Urine:1250; Other:2007]  Intake/Output this shift:  No intake/output data recorded.  Physical Examination:  Physical Exam  Constitutional: She is well-developed, well-nourished, and in no distress.  Cardiovascular: Normal rate, regular rhythm and normal heart sounds.  Exam reveals no gallop and no friction rub.   No murmur heard. Pulmonary/Chest: Effort normal and breath sounds normal. No respiratory distress. She has no wheezes. She has no rales.  Skin: Skin is warm and dry.  Left AVF immature, audible thrill on auscultation      Lab Results:  Recent Labs  01/26/17 0706 01/26/17 2221 01/27/17 0336  WBC 6.1 6.3 7.0  HGB 6.0* 5.6* 7.9*  HCT 18.5* 17.8* 24.7*  PLT 275 243 270     Recent Labs  01/26/17 0706 01/26/17 2222 01/27/17 0336  NA 138 136 138  K 5.3* 5.1 3.9  CL 110 106 105  CO2 18* 20* 20*  GLUCOSE 67 138* 154*  BUN 48* 44* 27*  CREATININE 9.03* 9.07* 6.54*  CALCIUM 6.6* 6.5* 8.2*  PHOS  --  5.2* 4.5    Recent Labs  01/26/17 2221  01/27/17 0336  ALBUMIN  --   < > 2.6*  ALT 9*  --   --   < > = values in this interval not  displayed.   Recent Labs  01/26/17 1040  LABPROT 15.0  INR 1.18    Lab Results  Component Value Date   CALCIUM 8.2 (L) 01/27/2017   CAION 0.73 (LL) 01/25/2017   PHOS 4.5 01/27/2017    Studies/Results: Dg Chest 2 View  Result Date: 01/25/2017 CLINICAL DATA:  Productive cough x2 days. Left-sided chest pain. Dialysis patient. EXAM: CHEST  2 VIEW COMPARISON:  04/24/2014 FINDINGS: Slightly low lung volumes with mild cardiac enlargement. No aortic aneurysm. Patchy airspace disease in the left upper and both lower lobes with trace bilateral pleural effusions left greater than right. Mild interstitial edema is also noted. No acute osseous abnormality. IMPRESSION: 1. Findings suggest multilobar pneumonia more so the left upper and right lower lobes. 2. Trace bilateral pleural effusions, left greater than right. 3. Mild interstitial edema and cardiomegaly. Electronically Signed   By: Ashley Royalty M.D.   On: 01/25/2017 19:46   Dg Chest Port 1 View  Result Date: 01/26/2017 CLINICAL DATA:  Hemodialysis catheter placement.  Initial encounter. EXAM: PORTABLE CHEST 1 VIEW COMPARISON:  Chest radiograph performed 01/25/2017 FINDINGS: The patient's right-sided dual-lumen catheter is noted ending about the cavoatrial junction. Bibasilar airspace opacities, left greater than right, may reflect pneumonia or interstitial edema. Small bilateral pleural effusions are seen. No pneumothorax is identified. The cardiomediastinal silhouette  is mildly enlarged. No acute osseous abnormalities are seen. IMPRESSION: 1. Right-sided dual-lumen catheter noted ending about the cavoatrial junction. 2. Bibasilar airspace opacities, left greater than right, may reflect pneumonia or interstitial edema. Small bilateral pleural effusions seen. Mild cardiomegaly. Electronically Signed   By: Garald Balding M.D.   On: 01/26/2017 21:45    Background:  [name] is a type I diabetec, CKD 5 followed by [facility].  She had rapid  progression of her disease between 03/18-6/18 - last creatinine PTA 11/2016 6.4 (Has newly placed LUE AVF 7/25 [facility]; active on transplant list there) Presented to the ED with 1 day history of increased productive cough and shortness of breath, creatinine of 8.7 and volume overloaded.  HD initiated 01/26/17 after placement TDC in IR.  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 Last creatinine PTA on 11/19/2016 was 6.4.   IR was consulted for placement of permanent HD cath while her fistula matures.  Patient had one session of HD yesterday and tolerated the session well.  -CLIP process started -HD #2 today -Will need TTS schedule as classes are on MWF  Electrolytes Hyperkalemia - resolved K is 3.9    Iron deficiency anemia Hemoglobin 6 on admission.  11/19/16 showed ferritin 184, iron 56, TIBC 277.       She received 1 unit of blood during dialysis yesterday, currently hemoglobin is 7.9 Started aranesp 250mcg weekly (last does 8/8).   Type I diabetes mellitus Follows with endocrinology at [facility].   On SSI-S  Bones Will get PTH and ionized calcium.   phoslo started.     LOS: [place], PGY-2 Internal medicine Pager: [phone]  Agree with above note of Dr.Hoffman. [name] with new ESRD, had 1st HD 8/8 via [place] placed by IR. Has maturing AVF LUE ([facility] 01/12/17). Transfused 1 u prbc's for Hb 5.9, Aranesp started, Fe studies pending. For HD #2 today. CLIP process started. Will need TTS schedule 2/2 classes at [employer] on MWF.  Jamal Maes, MD [facility] [phone] Pager 01/27/2017, 2:06 PM

## 2017-01-27 NOTE — Progress Notes (Signed)
PROGRESS NOTE    [name]  TKW:[id number] DOB: 1991/03/10 DOA: 01/25/2017 PCP: System, Pcp Not In  Brief Narrative:  [name] is a 26 y.o. female with medical history significant of DM1, CKD stage 4-5, just had fistula placed at end of July.  On kidney transplant list at [facility].  Patient presents to the ED with c/o cough and wheezing x1 day  ED Course: CXR shows "multilobar pneumonia".  Patient has no SIRS, Concern for fluid overload.  Creat  now 8.78.  HGB 6.6 down from 8.0 on 6/1 at [facility], no bed available to [facility] hence admitted at [place]:     CKD5/ Now ESRD  -Due to diabetic nephropathy, presented with volume overload and some symptoms of uremia  -Status post AV fistula on 7/25 at [facility] -s/p tunneled HD catheter 8/8, s/p first treatment 8/8 -Renal following -She is followed by a transplant team at [facility]   ? Pneumonia on CXR -clinically suspect this is fluid overload, no fevers or leukocytosis -repeat CXR after HD today improving fluid     Type I diabetes mellitus with renal manifestations, uncontrolled ([place]) -With hypoglycemia 8/8 morning,  resumed Lantus at half dose  -Sensitive sliding scale  -stable, monitor    Normocytic normochromic anemia -Due to iron deficiency and CKD5 -s/p 1 Unit PRBC 8/8 -anemia panel with mild iron defi, mostly chronic disease -No overt blood loss   DVT prophylaxis: heparin subcutaneous  Code Status:  full code  Family Communication:No family at bedside Disposition Plan: Home in 2-3 days if stable and CLIP for HD completed  Consultants:   Renal, and IR    Procedures:  HD catheter placement pending  Subjective: -Feels much better after her first hemodialysis treatment, nausea improving breathing much better  Objective: Vitals:   01/27/17 0037 01/27/17 0111 01/27/17 0443 01/27/17 0951  BP: (!) 175/102 (!) 164/85 (!) 148/81 (!) 159/95  Pulse: 96 95 96 98  Resp: '18 18 18 18  ' Temp: 99.2 F (37.3 C) 99  F (37.2 C) 98.6 F (37 C) 98 F (36.7 C)  TempSrc: Oral Oral Oral Oral  SpO2: 99% 95% 98% 98%  Weight: 64.7 kg (142 lb 10.2 oz)     Height:        Intake/Output Summary (Last 24 hours) at 01/27/17 1229 Last data filed at 01/27/17 0900  Gross per 24 hour  Intake             1095 ml  Output             2007 ml  Net             -912 ml   Filed Weights   01/25/17 1428 01/26/17 2053 01/27/17 0037  Weight: 62.1 kg (137 lb) 66.9 kg (147 lb 7.8 oz) 64.7 kg (142 lb 10.2 oz)    Examination:  Gen: Awake, Alert, Oriented X 3, no distress HEENT: PERRLA, Neck supple, no JVD Lungs: decreased bS at bases CVS: RRR,No Gallops,Rubs or new Murmurs Abd: soft, Non tender, non distended, BS present Extremities: Trace edema, left arm AV fistula noted Skin: no new rashes     Data Reviewed:   CBC:  Recent Labs Lab 01/25/17 2026 01/25/17 2114 01/26/17 0706 01/26/17 2221 01/27/17 0336  WBC 6.7  --  6.1 6.3 7.0  NEUTROABS 4.9  --   --   --   --   HGB 6.6* 6.5* 6.0* 5.6* 7.9*  HCT 20.3* 19.0* 18.5*  17.8* 24.7*  MCV 83.9  --  84.5 84.8 83.2  PLT 284  --  275 243 983   Basic Metabolic Panel:  Recent Labs Lab 01/25/17 2026 01/25/17 2114 01/26/17 0706 01/26/17 2222 01/27/17 0336  NA 140 138 138 136 138  K 5.5* 5.3* 5.3* 5.1 3.9  CL 111 114* 110 106 105  CO2 17*  --  18* 20* 20*  GLUCOSE 82 103* 67 138* 154*  BUN 47* 43* 48* 44* 27*  CREATININE 8.78* 9.70* 9.03* 9.07* 6.54*  CALCIUM 6.8*  --  6.6* 6.5* 8.2*  PHOS  --   --   --  5.2* 4.5   GFR: Estimated Creatinine Clearance: 11.3 mL/min (A) (by C-G formula based on SCr of 6.54 mg/dL (H)). Liver Function Tests:  Recent Labs Lab 01/26/17 2221 01/26/17 2222 01/27/17 0336  ALT 9*  --   --   ALBUMIN  --  2.2* 2.6*   No results for input(s): LIPASE, AMYLASE in the last 168 hours. No results for input(s): AMMONIA in the last 168 hours. Coagulation Profile:  Recent Labs Lab 01/26/17 1040  INR 1.18   Cardiac  Enzymes: No results for input(s): CKTOTAL, CKMB, CKMBINDEX, TROPONINI in the last 168 hours. BNP (last 3 results) No results for input(s): PROBNP in the last 8760 hours. HbA1C: No results for input(s): HGBA1C in the last 72 hours. CBG:  Recent Labs Lab 01/26/17 1736 01/26/17 2022 01/27/17 0109 01/27/17 0739 01/27/17 1144  GLUCAP 198* 124* 160* 127* 152*   Lipid Profile: No results for input(s): CHOL, HDL, LDLCALC, TRIG, CHOLHDL, LDLDIRECT in the last 72 hours. Thyroid Function Tests: No results for input(s): TSH, T4TOTAL, FREET4, T3FREE, THYROIDAB in the last 72 hours. Anemia Panel:  Recent Labs  01/26/17 0526 01/26/17 2221  FERRITIN  --  135  TIBC  --  230*  IRON  --  32  RETICCTPCT 1.5  --    Urine analysis:    Component Value Date/Time   COLORURINE YELLOW 04/07/2014 [place] 04/07/2014 1457   LABSPEC 1.025 04/07/2014 1457   PHURINE 5.0 04/07/2014 1457   GLUCOSEU >1000 (A) 04/07/2014 1457   HGBUR TRACE (A) 04/07/2014 1457   BILIRUBINUR NEGATIVE 04/07/2014 1457   KETONESUR 40 (A) 04/07/2014 1457   PROTEINUR NEGATIVE 04/07/2014 1457   UROBILINOGEN 0.2 04/07/2014 1457   NITRITE NEGATIVE 04/07/2014 1457   LEUKOCYTESUR NEGATIVE 04/07/2014 1457   Sepsis Labs: '@LABRCNTIP' (procalcitonin:4,lacticidven:4)  )No results found for this or any previous visit (from the past 240 hour(s)).       Radiology Studies: Dg Chest 2 View  Result Date: 01/25/2017 CLINICAL DATA:  Productive cough x2 days. Left-sided chest pain. Dialysis patient. EXAM: CHEST  2 VIEW COMPARISON:  04/24/2014 FINDINGS: Slightly low lung volumes with mild cardiac enlargement. No aortic aneurysm. Patchy airspace disease in the left upper and both lower lobes with trace bilateral pleural effusions left greater than right. Mild interstitial edema is also noted. No acute osseous abnormality. IMPRESSION: 1. Findings suggest multilobar pneumonia more so the left upper and right lower lobes. 2.  Trace bilateral pleural effusions, left greater than right. 3. Mild interstitial edema and cardiomegaly. Electronically Signed   By: Ashley Royalty M.D.   On: 01/25/2017 19:46   Ir Fluoro Guide Cv Line Right  Result Date: 01/27/2017 INDICATION: 26 year old female with progressive chronic kidney disease now in need of hemodialysis. EXAM: TUNNELED CENTRAL VENOUS HEMODIALYSIS CATHETER PLACEMENT WITH ULTRASOUND AND FLUOROSCOPIC GUIDANCE MEDICATIONS: 2 g Ancef . The  antibiotic was given in an appropriate time interval prior to skin puncture. ANESTHESIA/SEDATION: Moderate (conscious) sedation was employed during this procedure. A total of Versed 1 mg and Fentanyl 75 mcg was administered intravenously. Moderate Sedation Time: 25 minutes. The patient's level of consciousness and vital signs were monitored continuously by radiology nursing throughout the procedure under my direct supervision. FLUOROSCOPY TIME:  Fluoroscopy Time: 0 minutes 36 seconds (2 mGy). COMPLICATIONS: None immediate. PROCEDURE: Informed written consent was obtained from the patient after a discussion of the risks, benefits, and alternatives to treatment. Questions regarding the procedure were encouraged and answered. The right neck and chest were prepped with chlorhexidine in a sterile fashion, and a sterile drape was applied covering the operative field. Maximum barrier sterile technique with sterile gowns and gloves were used for the procedure. A timeout was performed prior to the initiation of the procedure. After creating a small venotomy incision, a micropuncture kit was utilized to access the right internal jugular vein under direct, real-time ultrasound guidance after the overlying soft tissues were anesthetized with 1% lidocaine with epinephrine. Ultrasound image documentation was performed. The microwire was kinked to measure appropriate catheter length. A stiff Glidewire was advanced to the level of the IVC and the micropuncture sheath was  exchanged for a peel-away sheath. A Palindrome tunneled hemodialysis catheter measuring 19 cm from tip to cuff was tunneled in a retrograde fashion from the anterior chest wall to the venotomy incision. The catheter was then placed through the peel-away sheath with tips ultimately positioned within the superior aspect of the right atrium. Final catheter positioning was confirmed and documented with a spot radiographic image. The catheter aspirates and flushes normally. The catheter was flushed with appropriate volume heparin dwells. The catheter exit site was secured with a 0-Prolene retention suture. The venotomy incision was closed with an interrupted 4-0 Vicryl, and reinforced with derma bond. Dressings were applied. The patient tolerated the procedure well without immediate post procedural complication. IMPRESSION: Successful placement of 19 cm tip to cuff tunneled hemodialysis catheter via the right internal jugular vein with tips terminating within the superior aspect of the right atrium. The catheter is ready for immediate use. Electronically Signed   By: Jacqulynn Cadet M.D.   On: 01/27/2017 11:42   Ir US Guide Vasc Access Right  Result Date: 01/27/2017 INDICATION: 26 year old female with progressive chronic kidney disease now in need of hemodialysis. EXAM: TUNNELED CENTRAL VENOUS HEMODIALYSIS CATHETER PLACEMENT WITH ULTRASOUND AND FLUOROSCOPIC GUIDANCE MEDICATIONS: 2 g Ancef . The antibiotic was given in an appropriate time interval prior to skin puncture. ANESTHESIA/SEDATION: Moderate (conscious) sedation was employed during this procedure. A total of Versed 1 mg and Fentanyl 75 mcg was administered intravenously. Moderate Sedation Time: 25 minutes. The patient's level of consciousness and vital signs were monitored continuously by radiology nursing throughout the procedure under my direct supervision. FLUOROSCOPY TIME:  Fluoroscopy Time: 0 minutes 36 seconds (2 mGy). COMPLICATIONS: None immediate.  PROCEDURE: Informed written consent was obtained from the patient after a discussion of the risks, benefits, and alternatives to treatment. Questions regarding the procedure were encouraged and answered. The right neck and chest were prepped with chlorhexidine in a sterile fashion, and a sterile drape was applied covering the operative field. Maximum barrier sterile technique with sterile gowns and gloves were used for the procedure. A timeout was performed prior to the initiation of the procedure. After creating a small venotomy incision, a micropuncture kit was utilized to access the right internal jugular vein under direct,  real-time ultrasound guidance after the overlying soft tissues were anesthetized with 1% lidocaine with epinephrine. Ultrasound image documentation was performed. The microwire was kinked to measure appropriate catheter length. A stiff Glidewire was advanced to the level of the IVC and the micropuncture sheath was exchanged for a peel-away sheath. A Palindrome tunneled hemodialysis catheter measuring 19 cm from tip to cuff was tunneled in a retrograde fashion from the anterior chest wall to the venotomy incision. The catheter was then placed through the peel-away sheath with tips ultimately positioned within the superior aspect of the right atrium. Final catheter positioning was confirmed and documented with a spot radiographic image. The catheter aspirates and flushes normally. The catheter was flushed with appropriate volume heparin dwells. The catheter exit site was secured with a 0-Prolene retention suture. The venotomy incision was closed with an interrupted 4-0 Vicryl, and reinforced with derma bond. Dressings were applied. The patient tolerated the procedure well without immediate post procedural complication. IMPRESSION: Successful placement of 19 cm tip to cuff tunneled hemodialysis catheter via the right internal jugular vein with tips terminating within the superior aspect of the  right atrium. The catheter is ready for immediate use. Electronically Signed   By: Jacqulynn Cadet M.D.   On: 01/27/2017 11:42   Dg Chest Port 1 View  Result Date: 01/26/2017 CLINICAL DATA:  Hemodialysis catheter placement.  Initial encounter. EXAM: PORTABLE CHEST 1 VIEW COMPARISON:  Chest radiograph performed 01/25/2017 FINDINGS: The patient's right-sided dual-lumen catheter is noted ending about the cavoatrial junction. Bibasilar airspace opacities, left greater than right, may reflect pneumonia or interstitial edema. Small bilateral pleural effusions are seen. No pneumothorax is identified. The cardiomediastinal silhouette is mildly enlarged. No acute osseous abnormalities are seen. IMPRESSION: 1. Right-sided dual-lumen catheter noted ending about the cavoatrial junction. 2. Bibasilar airspace opacities, left greater than right, may reflect pneumonia or interstitial edema. Small bilateral pleural effusions seen. Mild cardiomegaly. Electronically Signed   By: Garald Balding M.D.   On: 01/26/2017 21:45        Scheduled Meds: . amLODipine  2.5 mg Oral Daily  . calcium acetate  1,334 mg Oral TID WC  . darbepoetin (ARANESP) injection - DIALYSIS  200 mcg Intravenous Q Wed-HD  . heparin  5,000 Units Subcutaneous Q8H  . insulin aspart  0-9 Units Subcutaneous TID WC  . insulin glargine  5 Units Subcutaneous QHS  . ketorolac  1 drop Both Eyes QID  . metoprolol succinate  25 mg Oral Daily  . multivitamin  1 tablet Oral QHS  . prednisoLONE acetate  1 drop Both Eyes QID  . senna  1 tablet Oral Daily   Continuous Infusions: . sodium chloride       LOS: 1 day    Time spent: 19mn    PDomenic Polite MD [facility] Pager [phone] If 7PM-7AM, please contact night-coverage [email or web address] Password [email or web address] 01/27/2017, 12:29 PM

## 2017-01-27 NOTE — Progress Notes (Signed)
Patient stated that she takes norethindrone 0.35 mg tablets for birth control at home. MD notified. No further orders. Will continue to monitor.

## 2017-01-27 NOTE — Progress Notes (Signed)
HD tx completed @ 0030 w/o problem, UF goal met, blood rinsed back, pt received 1 unit PRBC w/o problem, no s/s of reaction for morning hgb of 6.0, my pre tx CBC resulted 5.7 but each time I looked for result it had not resulted yet, critical value never called to me or primary nurse, when I finaly saw the result I would not have had time to give an additional unit of PRBC on tx if that is what would have been ordered.  Called the lab to see why then critical value was not called and was told that it was not called b/c it was consistent w/ the morning result of 6.0.  Paged Dr. Jimmy Footman to make him aware and to see if he wanted pt to get another unit of PRBC on the floor or tomorrow on tx and no new orders received.  Advised me that they would re check her hgb in morning and go from there. Report was called to Mady Gemma @ [phone]

## 2017-01-27 NOTE — Progress Notes (Signed)
Initial Nutrition Assessment  DOCUMENTATION CODES:   Not applicable  INTERVENTION:   -Recommend snacks BID  -Pt declined nutritional supplement at this time  -Provided written/verbal education on Dialysis Diet to pt and her mother. Reviewed role of protein, sodium, potassium, phosphorus and fluid in the diet. Pt receptive to education, adherence likely   NUTRITION DIAGNOSIS:   Food and nutrition related knowledge deficit related to other (see comment) (new ESRD on HD) as evidenced by  (consult for diet education).  GOAL:   Patient will meet greater than or equal to 90% of their needs  MONITOR:   PO intake, Labs, Weight trends, I & O's  REASON FOR ASSESSMENT:   Consult Diet education  ASSESSMENT:   26 yo female admitted with fluid overload, progression of CKD to ESRD. Pt with hx of Type I DM with hx of DKA, CKD stage IV/V with fistula placement at the end of July. Noted pt is on kidney transplant list   Pt reports appetite is good, reports she typically eats 2 meals per day. Encouraged pt to eat more frequently throughout the day for better blood glucose management and to optimize nutritional intake. Pt should not go more than 4-5 hours without eating something  Pt reports her weight is up, UBW around 120-130 pounds. Current wt of 142 pounds.   8/8 tunneled HD cathether placed, Fistula not yet mature  8/8 1st HD session  UOP 450 mL plus 2 unmeasured urine occurrences. Pt with mild edema in LE  Labs: phosphorus 4.2 (Wdl), potassium 3.9 (wdl), corrected calcium 9.3, albumin 2.6 Meds: phoslo, Rena-Vit  Diet Order:  Diet renal/carb modified with fluid restriction Diet-HS Snack? Nothing; Room service appropriate? Yes; Fluid consistency: Thin; Fluid restriction: 1500 mL Fluid  Skin:  Reviewed, no issues  Last BM:  8/8  Height:   Ht Readings from Last 1 Encounters:  01/25/17 5\' 1"  (1.549 m)    Weight:   Wt Readings from Last 1 Encounters:  01/27/17 142 lb 10.2  oz (64.7 kg)    Ideal Body Weight:     BMI:  Body mass index is 26.95 kg/m.  Estimated Nutritional Needs:   Kcal:  0100-7121 kcals  Protein:  85-100 g  Fluid:   1000 mL plus UOP (1500 mL at present)  EDUCATION NEEDS:   Education needs addressed   Kerman Passey MS, RD, LDN [phone] Pager  [phone] Weekend/On-Call Pager

## 2017-01-28 LAB — HEPATITIS B SURFACE ANTIGEN: HEP B S AG: NEGATIVE

## 2017-01-28 LAB — HEPATITIS B SURFACE ANTIBODY,QUALITATIVE: HEP B S AB: NONREACTIVE

## 2017-01-28 LAB — GLUCOSE, CAPILLARY
GLUCOSE-CAPILLARY: 318 mg/dL — AB (ref 65–99)
Glucose-Capillary: 481 mg/dL — ABNORMAL HIGH (ref 65–99)
Glucose-Capillary: 304 mg/dL — ABNORMAL HIGH (ref 65–99)
Glucose-Capillary: 234 mg/dL — ABNORMAL HIGH (ref 65–99)

## 2017-01-28 LAB — PARATHYROID HORMONE, INTACT (NO CA): PTH: 592 pg/mL — ABNORMAL HIGH (ref 15–65)

## 2017-01-28 LAB — HEPATITIS B CORE ANTIBODY, TOTAL: Hep B Core Total Ab: NEGATIVE

## 2017-01-28 MED ORDER — SODIUM CHLORIDE 0.9 % IV SOLN
250.0000 mg | INTRAVENOUS | Status: DC
  Administered 2017-01-29: 250 mg via INTRAVENOUS
  Filled 2017-01-28 (×2): qty 20

## 2017-01-28 MED ORDER — DOXERCALCIFEROL 4 MCG/2ML IV SOLN
1.0000 ug | INTRAVENOUS | Status: DC
  Administered 2017-01-29: 1 ug via INTRAVENOUS
  Filled 2017-01-28: qty 2

## 2017-01-28 NOTE — Progress Notes (Signed)
PROGRESS NOTE    [name]  LHT:[id number] DOB: Jul 20, 1990 DOA: 01/25/2017 PCP: System, Pcp Not In  Brief Narrative:  [name] is a 26 y.o. female with medical history significant of DM1, CKD stage 4-5, just had fistula placed at end of July.  On kidney transplant list at [facility]. She had dialysis twice since admission.she feels better today.denies any new complaints.    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 -Sensitive sliding scale  -stable, monitor.    Normocytic normochromic anemia -Due to iron deficiency and CKD5 -s/p 1 Unit PRBC 8/8 -anemia panel with mild iron defi, mostly chronic disease -No overt blood loss   DVT prophylaxis: heparin subcutaneous  Code Status:  full code  Family Communication:No family at bedside Disposition Plan: Home tomorrow after dialysis.  Consultants:   Renal, and IR    Procedures:  HD catheter placement pending  Subjective: -Feels much better after her first hemodialysis treatment, nausea improving breathing much better  Objective: Vitals:   01/27/17 2012 01/27/17 2115 01/28/17 0432 01/28/17 1051  BP: (!) 169/94 (!) 167/94 (!) 146/82 138/78  Pulse: 98 100 91   Resp:   16   Temp: 98.8 F (37.1 C) 98.9 F (37.2 C) 98.7 F (37.1 C)   TempSrc: Oral Oral Oral   SpO2: 98% 100% 97%   Weight: 58.6 kg (129 lb 3 oz)     Height:        Intake/Output Summary (Last 24 hours) at 01/28/17 1452 Last data filed at 01/28/17 1108  Gross per 24 hour  Intake              240 ml  Output             3000 ml  Net            -2760 ml   Filed Weights   01/27/17 0037  01/27/17 1700 01/27/17 2012  Weight: 64.7 kg (142 lb 10.2 oz) 62 kg (136 lb 11 oz) 58.6 kg (129 lb 3 oz)    Examination:  Gen: Awake, Alert, Oriented X 3, no distress HEENT: PERRLA, Neck supple, no JVD Lungs: decreased bS at bases CVS: RRR,No Gallops,Rubs or new Murmurs Abd: soft, Non tender, non distended, BS present Extremities: Trace edema, left arm AV fistula noted Skin: no new rashes     Data Reviewed:   CBC:  Recent Labs Lab 01/25/17 2026 01/25/17 2114 01/26/17 0706 01/26/17 2221 01/27/17 0336  WBC 6.7  --  6.1 6.3 7.0  NEUTROABS 4.9  --   --   --   --   HGB 6.6* 6.5* 6.0* 5.6* 7.9*  HCT 20.3* 19.0* 18.5* 17.8* 24.7*  MCV 83.9  --  84.5 84.8 83.2  PLT 284  --  275 243 572   Basic Metabolic Panel:  Recent Labs Lab 01/25/17 2026 01/25/17 2114 01/26/17 0706 01/26/17 2222 01/27/17 0336  NA 140 138 138 136 138  K 5.5* 5.3* 5.3* 5.1 3.9  CL 111 114* 110 106 105  CO2 17*  --  18* 20* 20*  GLUCOSE 82 103* 67 138* 154*  BUN  47* 43* 48* 44* 27*  CREATININE 8.78* 9.70* 9.03* 9.07* 6.54*  CALCIUM 6.8*  --  6.6* 6.5* 8.2*  PHOS  --   --   --  5.2* 4.5   GFR: Estimated Creatinine Clearance: 10.8 mL/min (A) (by C-G formula based on SCr of 6.54 mg/dL (H)). Liver Function Tests:  Recent Labs Lab 01/26/17 2221 01/26/17 2222 01/27/17 0336  ALT 9*  --   --   ALBUMIN  --  2.2* 2.6*   No results for input(s): LIPASE, AMYLASE in the last 168 hours. No results for input(s): AMMONIA in the last 168 hours. Coagulation Profile:  Recent Labs Lab 01/26/17 1040  INR 1.18   Cardiac Enzymes: No results for input(s): CKTOTAL, CKMB, CKMBINDEX, TROPONINI in the last 168 hours. BNP (last 3 results) No results for input(s): PROBNP in the last 8760 hours. HbA1C: No results for input(s): HGBA1C in the last 72 hours. CBG:  Recent Labs Lab 01/27/17 0739 01/27/17 1144 01/27/17 2114 01/28/17 0733 01/28/17 1313  GLUCAP 127* 152* 171* 481* 304*   Lipid  Profile: No results for input(s): CHOL, HDL, LDLCALC, TRIG, CHOLHDL, LDLDIRECT in the last 72 hours. Thyroid Function Tests: No results for input(s): TSH, T4TOTAL, FREET4, T3FREE, THYROIDAB in the last 72 hours. Anemia Panel:  Recent Labs  01/26/17 0526 01/26/17 2221  FERRITIN  --  135  TIBC  --  230*  IRON  --  32  RETICCTPCT 1.5  --    Urine analysis:    Component Value Date/Time   COLORURINE YELLOW 04/07/2014 [place] 04/07/2014 1457   LABSPEC 1.025 04/07/2014 1457   PHURINE 5.0 04/07/2014 1457   GLUCOSEU >1000 (A) 04/07/2014 1457   HGBUR TRACE (A) 04/07/2014 1457   BILIRUBINUR NEGATIVE 04/07/2014 1457   KETONESUR 40 (A) 04/07/2014 1457   PROTEINUR NEGATIVE 04/07/2014 1457   UROBILINOGEN 0.2 04/07/2014 1457   NITRITE NEGATIVE 04/07/2014 1457   LEUKOCYTESUR NEGATIVE 04/07/2014 1457   Sepsis Labs: '@LABRCNTIP' (procalcitonin:4,lacticidven:4)  ) Recent Results (from the past 240 hour(s))  MRSA PCR Screening     Status: None   Collection Time: 01/27/17 12:46 PM  Result Value Ref Range Status   MRSA by PCR NEGATIVE NEGATIVE Final    Comment:        The GeneXpert MRSA Assay (FDA approved for NASAL specimens only), is one component of a comprehensive MRSA colonization surveillance program. It is not intended to diagnose MRSA infection nor to guide or monitor treatment for MRSA infections.          Radiology Studies: Ir Fluoro Guide Cv Line Right  Result Date: 01/27/2017 INDICATION: 26 year old female with progressive chronic kidney disease now in need of hemodialysis. EXAM: TUNNELED CENTRAL VENOUS HEMODIALYSIS CATHETER PLACEMENT WITH ULTRASOUND AND FLUOROSCOPIC GUIDANCE MEDICATIONS: 2 g Ancef . The antibiotic was given in an appropriate time interval prior to skin puncture. ANESTHESIA/SEDATION: Moderate (conscious) sedation was employed during this procedure. A total of Versed 1 mg and Fentanyl 75 mcg was administered intravenously. Moderate  Sedation Time: 25 minutes. The patient's level of consciousness and vital signs were monitored continuously by radiology nursing throughout the procedure under my direct supervision. FLUOROSCOPY TIME:  Fluoroscopy Time: 0 minutes 36 seconds (2 mGy). COMPLICATIONS: None immediate. PROCEDURE: Informed written consent was obtained from the patient after a discussion of the risks, benefits, and alternatives to treatment. Questions regarding the procedure were encouraged and answered. The right neck and chest were prepped with chlorhexidine in a sterile fashion, and a sterile  drape was applied covering the operative field. Maximum barrier sterile technique with sterile gowns and gloves were used for the procedure. A timeout was performed prior to the initiation of the procedure. After creating a small venotomy incision, a micropuncture kit was utilized to access the right internal jugular vein under direct, real-time ultrasound guidance after the overlying soft tissues were anesthetized with 1% lidocaine with epinephrine. Ultrasound image documentation was performed. The microwire was kinked to measure appropriate catheter length. A stiff Glidewire was advanced to the level of the IVC and the micropuncture sheath was exchanged for a peel-away sheath. A Palindrome tunneled hemodialysis catheter measuring 19 cm from tip to cuff was tunneled in a retrograde fashion from the anterior chest wall to the venotomy incision. The catheter was then placed through the peel-away sheath with tips ultimately positioned within the superior aspect of the right atrium. Final catheter positioning was confirmed and documented with a spot radiographic image. The catheter aspirates and flushes normally. The catheter was flushed with appropriate volume heparin dwells. The catheter exit site was secured with a 0-Prolene retention suture. The venotomy incision was closed with an interrupted 4-0 Vicryl, and reinforced with derma bond. Dressings  were applied. The patient tolerated the procedure well without immediate post procedural complication. IMPRESSION: Successful placement of 19 cm tip to cuff tunneled hemodialysis catheter via the right internal jugular vein with tips terminating within the superior aspect of the right atrium. The catheter is ready for immediate use. Electronically Signed   By: Jacqulynn Cadet M.D.   On: 01/27/2017 11:42   Ir US Guide Vasc Access Right  Result Date: 01/27/2017 INDICATION: 26 year old female with progressive chronic kidney disease now in need of hemodialysis. EXAM: TUNNELED CENTRAL VENOUS HEMODIALYSIS CATHETER PLACEMENT WITH ULTRASOUND AND FLUOROSCOPIC GUIDANCE MEDICATIONS: 2 g Ancef . The antibiotic was given in an appropriate time interval prior to skin puncture. ANESTHESIA/SEDATION: Moderate (conscious) sedation was employed during this procedure. A total of Versed 1 mg and Fentanyl 75 mcg was administered intravenously. Moderate Sedation Time: 25 minutes. The patient's level of consciousness and vital signs were monitored continuously by radiology nursing throughout the procedure under my direct supervision. FLUOROSCOPY TIME:  Fluoroscopy Time: 0 minutes 36 seconds (2 mGy). COMPLICATIONS: None immediate. PROCEDURE: Informed written consent was obtained from the patient after a discussion of the risks, benefits, and alternatives to treatment. Questions regarding the procedure were encouraged and answered. The right neck and chest were prepped with chlorhexidine in a sterile fashion, and a sterile drape was applied covering the operative field. Maximum barrier sterile technique with sterile gowns and gloves were used for the procedure. A timeout was performed prior to the initiation of the procedure. After creating a small venotomy incision, a micropuncture kit was utilized to access the right internal jugular vein under direct, real-time ultrasound guidance after the overlying soft tissues were anesthetized  with 1% lidocaine with epinephrine. Ultrasound image documentation was performed. The microwire was kinked to measure appropriate catheter length. A stiff Glidewire was advanced to the level of the IVC and the micropuncture sheath was exchanged for a peel-away sheath. A Palindrome tunneled hemodialysis catheter measuring 19 cm from tip to cuff was tunneled in a retrograde fashion from the anterior chest wall to the venotomy incision. The catheter was then placed through the peel-away sheath with tips ultimately positioned within the superior aspect of the right atrium. Final catheter positioning was confirmed and documented with a spot radiographic image. The catheter aspirates and flushes normally. The catheter  was flushed with appropriate volume heparin dwells. The catheter exit site was secured with a 0-Prolene retention suture. The venotomy incision was closed with an interrupted 4-0 Vicryl, and reinforced with derma bond. Dressings were applied. The patient tolerated the procedure well without immediate post procedural complication. IMPRESSION: Successful placement of 19 cm tip to cuff tunneled hemodialysis catheter via the right internal jugular vein with tips terminating within the superior aspect of the right atrium. The catheter is ready for immediate use. Electronically Signed   By: Jacqulynn Cadet M.D.   On: 01/27/2017 11:42   Dg Chest Port 1 View  Result Date: 01/26/2017 CLINICAL DATA:  Hemodialysis catheter placement.  Initial encounter. EXAM: PORTABLE CHEST 1 VIEW COMPARISON:  Chest radiograph performed 01/25/2017 FINDINGS: The patient's right-sided dual-lumen catheter is noted ending about the cavoatrial junction. Bibasilar airspace opacities, left greater than right, may reflect pneumonia or interstitial edema. Small bilateral pleural effusions are seen. No pneumothorax is identified. The cardiomediastinal silhouette is mildly enlarged. No acute osseous abnormalities are seen. IMPRESSION: 1.  Right-sided dual-lumen catheter noted ending about the cavoatrial junction. 2. Bibasilar airspace opacities, left greater than right, may reflect pneumonia or interstitial edema. Small bilateral pleural effusions seen. Mild cardiomegaly. Electronically Signed   By: Garald Balding M.D.   On: 01/26/2017 21:45        Scheduled Meds: . amLODipine  2.5 mg Oral Daily  . calcium acetate  1,334 mg Oral TID WC  . darbepoetin (ARANESP) injection - DIALYSIS  200 mcg Intravenous Q Wed-HD  . [START ON 01/29/2017] doxercalciferol  1 mcg Intravenous Q T,Th,Sa-HD  . heparin  5,000 Units Subcutaneous Q8H  . insulin aspart  0-9 Units Subcutaneous TID WC  . insulin glargine  5 Units Subcutaneous QHS  . ketorolac  1 drop Both Eyes QID  . metoprolol succinate  25 mg Oral Daily  . multivitamin  1 tablet Oral QHS  . norethindrone  1 tablet Oral Daily  . prednisoLONE acetate  1 drop Both Eyes QID  . senna  1 tablet Oral Daily   Continuous Infusions: . sodium chloride    . [START ON 01/29/2017] ferric gluconate (FERRLECIT/NULECIT) IV       LOS: 2 days    Time spent: 5mn    [facility] Pager [phone]  If 7PM-7AM, please contact night-coverage [email or web address] Password [email or web address] 01/28/2017, 2:52 PM

## 2017-01-28 NOTE — Progress Notes (Signed)
Inpatient Diabetes Program Recommendations  AACE/ADA: New Consensus Statement on Inpatient Glycemic Control (2015)  Target Ranges:  Prepandial:   less than 140 mg/dL      Peak postprandial:   less than 180 mg/dL (1-2 hours)      Critically ill patients:  140 - 180 mg/dL   Results for [name], [name] (MRN [id number]) as of 01/28/2017 13:41  Ref. Range 01/27/2017 07:39 01/27/2017 11:44 01/27/2017 21:14 01/28/2017 07:33 01/28/2017 13:13  Glucose-Capillary Latest Ref Range: 65 - 99 mg/dL 127 (H) 152 (H) 171 (H) 481 (H) 304 (H)   Review of Glycemic Control  Diabetes history: DM1 Outpatient Diabetes medications: Lantus 14 units QHS, Novolog 2-10 units TID with meals Current orders for Inpatient glycemic control: Lantus 5 units QHS, Novolog 0-9 units TID with meals  Inpatient Diabetes Program Recommendations: Insulin - Basal: Please consider increasing Lantus to 7 units QHS. Insulin - Meal Coverage: Please consider ordering Novolog 3 units TID with meals for meal coverage if patient eats at least 50% of meals. Insulin-Correction: Please consider ordering Novolog 0-5 units QHS for bedtime correction scale.  Thanks, Barnie Alderman, RN, MSN, CDE Diabetes Coordinator Inpatient Diabetes Program [phone] (Team Pager from 8am to 5pm)

## 2017-01-28 NOTE — Progress Notes (Signed)
CRITICAL VALUE ALERT  Critical Value:  CBG 481  Date & Time Notied:  01/28/17 0740  Provider Notified: Zigmund Daniel  Orders Received/Actions taken: MD notified. MD ordered to give 9 units of Novolog. Orders followed. Will continue to monitor.

## 2017-01-28 NOTE — Progress Notes (Signed)
Patient complaining of diarrhea. MD notified. No further orders. Will continue to monitor.

## 2017-01-28 NOTE — Progress Notes (Signed)
[facility] Rounding Note  Subjective/Interval History:   Patient states she had a hemodialysis session yesterday and had an episode of nausea/vomiting afterwards.  She states that her breathing continues to improve.  She denies any back pain, chest pain or leg swelling.   Objective: Vital signs in last 24 hours:  Temp:  [98 F (36.7 C)-98.9 F (37.2 C)] 98.7 F (37.1 C) (08/10 0432) Pulse Rate:  [91-101] 91 (08/10 0432) Resp:  [10-18] 16 (08/10 0432) BP: (146-177)/(82-102) 146/82 (08/10 0432) SpO2:  [97 %-100 %] 97 % (08/10 0432) Weight:  [129 lb 3 oz (58.6 kg)-136 lb 11 oz (62 kg)] 129 lb 3 oz (58.6 kg) (08/09 2012) Weight change: -10 lb 12.8 oz (-4.9 kg)  Intake/Output: I/O last 3 completed shifts: In: 7616 [P.O.:720; Blood:315] Out: 5007 [WVPXT:0626]  Intake/Output this shift:  No intake/output data recorded.  Physical Examination:  Physical Exam  Constitutional: She is well-developed, well-nourished, and in no distress.  Cardiovascular: Normal rate, regular rhythm and normal heart sounds.  Exam reveals no gallop and no friction rub.   No murmur heard. Pulmonary/Chest: Effort normal and breath sounds normal. No respiratory distress. She has no wheezes. She has no rales.  Skin: Skin is warm and dry.     Lab Results:  Recent Labs  01/26/17 0706 01/26/17 2221 01/27/17 0336  WBC 6.1 6.3 7.0  HGB 6.0* 5.6* 7.9*  HCT 18.5* 17.8* 24.7*  PLT 275 243 270     Recent Labs  01/26/17 0706 01/26/17 2222 01/27/17 0336  NA 138 136 138  K 5.3* 5.1 3.9  CL 110 106 105  CO2 18* 20* 20*  GLUCOSE 67 138* 154*  BUN 48* 44* 27*  CREATININE 9.03* 9.07* 6.54*  CALCIUM 6.6* 6.5* 8.2*  PHOS  --  5.2* 4.5    Recent Labs  01/26/17 2221  01/27/17 0336  ALBUMIN  --   < > 2.6*  ALT 9*  --   --   < > = values in this interval not displayed.   Recent Labs  01/26/17 1040  LABPROT 15.0  INR 1.18    Lab Results  Component Value Date   PTH 592 (H) 01/26/2017   CALCIUM 8.2  (L) 01/27/2017   CAION 0.73 (LL) 01/25/2017   PHOS 4.5 01/27/2017    Studies/Results: Ir Fluoro Guide Cv Line Right  Result Date: 01/27/2017 INDICATION: 26 year old female with progressive chronic kidney disease now in need of hemodialysis. EXAM: TUNNELED CENTRAL VENOUS HEMODIALYSIS CATHETER PLACEMENT WITH ULTRASOUND AND FLUOROSCOPIC GUIDANCE MEDICATIONS: 2 g Ancef . The antibiotic was given in an appropriate time interval prior to skin puncture. ANESTHESIA/SEDATION: Moderate (conscious) sedation was employed during this procedure. A total of Versed 1 mg and Fentanyl 75 mcg was administered intravenously. Moderate Sedation Time: 25 minutes. The patient's level of consciousness and vital signs were monitored continuously by radiology nursing throughout the procedure under my direct supervision. FLUOROSCOPY TIME:  Fluoroscopy Time: 0 minutes 36 seconds (2 mGy). COMPLICATIONS: None immediate. PROCEDURE: Informed written consent was obtained from the patient after a discussion of the risks, benefits, and alternatives to treatment. Questions regarding the procedure were encouraged and answered. The right neck and chest were prepped with chlorhexidine in a sterile fashion, and a sterile drape was applied covering the operative field. Maximum barrier sterile technique with sterile gowns and gloves were used for the procedure. A timeout was performed prior to the initiation of the procedure. After creating a small venotomy incision, a micropuncture kit was utilized to  access the right internal jugular vein under direct, real-time ultrasound guidance after the overlying soft tissues were anesthetized with 1% lidocaine with epinephrine. Ultrasound image documentation was performed. The microwire was kinked to measure appropriate catheter length. A stiff Glidewire was advanced to the level of the IVC and the micropuncture sheath was exchanged for a peel-away sheath. A Palindrome tunneled hemodialysis catheter measuring  19 cm from tip to cuff was tunneled in a retrograde fashion from the anterior chest wall to the venotomy incision. The catheter was then placed through the peel-away sheath with tips ultimately positioned within the superior aspect of the right atrium. Final catheter positioning was confirmed and documented with a spot radiographic image. The catheter aspirates and flushes normally. The catheter was flushed with appropriate volume heparin dwells. The catheter exit site was secured with a 0-Prolene retention suture. The venotomy incision was closed with an interrupted 4-0 Vicryl, and reinforced with derma bond. Dressings were applied. The patient tolerated the procedure well without immediate post procedural complication. IMPRESSION: Successful placement of 19 cm tip to cuff tunneled hemodialysis catheter via the right internal jugular vein with tips terminating within the superior aspect of the right atrium. The catheter is ready for immediate use. Electronically Signed   By: Jacqulynn Cadet M.D.   On: 01/27/2017 11:42   Ir US Guide Vasc Access Right  Result Date: 01/27/2017 INDICATION: 26 year old female with progressive chronic kidney disease now in need of hemodialysis. EXAM: TUNNELED CENTRAL VENOUS HEMODIALYSIS CATHETER PLACEMENT WITH ULTRASOUND AND FLUOROSCOPIC GUIDANCE MEDICATIONS: 2 g Ancef . The antibiotic was given in an appropriate time interval prior to skin puncture. ANESTHESIA/SEDATION: Moderate (conscious) sedation was employed during this procedure. A total of Versed 1 mg and Fentanyl 75 mcg was administered intravenously. Moderate Sedation Time: 25 minutes. The patient's level of consciousness and vital signs were monitored continuously by radiology nursing throughout the procedure under my direct supervision. FLUOROSCOPY TIME:  Fluoroscopy Time: 0 minutes 36 seconds (2 mGy). COMPLICATIONS: None immediate. PROCEDURE: Informed written consent was obtained from the patient after a discussion of  the risks, benefits, and alternatives to treatment. Questions regarding the procedure were encouraged and answered. The right neck and chest were prepped with chlorhexidine in a sterile fashion, and a sterile drape was applied covering the operative field. Maximum barrier sterile technique with sterile gowns and gloves were used for the procedure. A timeout was performed prior to the initiation of the procedure. After creating a small venotomy incision, a micropuncture kit was utilized to access the right internal jugular vein under direct, real-time ultrasound guidance after the overlying soft tissues were anesthetized with 1% lidocaine with epinephrine. Ultrasound image documentation was performed. The microwire was kinked to measure appropriate catheter length. A stiff Glidewire was advanced to the level of the IVC and the micropuncture sheath was exchanged for a peel-away sheath. A Palindrome tunneled hemodialysis catheter measuring 19 cm from tip to cuff was tunneled in a retrograde fashion from the anterior chest wall to the venotomy incision. The catheter was then placed through the peel-away sheath with tips ultimately positioned within the superior aspect of the right atrium. Final catheter positioning was confirmed and documented with a spot radiographic image. The catheter aspirates and flushes normally. The catheter was flushed with appropriate volume heparin dwells. The catheter exit site was secured with a 0-Prolene retention suture. The venotomy incision was closed with an interrupted 4-0 Vicryl, and reinforced with derma bond. Dressings were applied. The patient tolerated the procedure well without immediate  post procedural complication. IMPRESSION: Successful placement of 19 cm tip to cuff tunneled hemodialysis catheter via the right internal jugular vein with tips terminating within the superior aspect of the right atrium. The catheter is ready for immediate use. Electronically Signed   By: Jacqulynn Cadet M.D.   On: 01/27/2017 11:42   Dg Chest Port 1 View  Result Date: 01/26/2017 CLINICAL DATA:  Hemodialysis catheter placement.  Initial encounter. EXAM: PORTABLE CHEST 1 VIEW COMPARISON:  Chest radiograph performed 01/25/2017 FINDINGS: The patient's right-sided dual-lumen catheter is noted ending about the cavoatrial junction. Bibasilar airspace opacities, left greater than right, may reflect pneumonia or interstitial edema. Small bilateral pleural effusions are seen. No pneumothorax is identified. The cardiomediastinal silhouette is mildly enlarged. No acute osseous abnormalities are seen. IMPRESSION: 1. Right-sided dual-lumen catheter noted ending about the cavoatrial junction. 2. Bibasilar airspace opacities, left greater than right, may reflect pneumonia or interstitial edema. Small bilateral pleural effusions seen. Mild cardiomegaly. Electronically Signed   By: Garald Balding M.D.   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  She had rapid progression of her disease between 03/18-6/18 - last creatinine PTA 11/2016 6.4 (Has newly placed LUE AVF 7/25 [facility]; active on transplant list there) Presented to the ED with 1 day history of increased productive cough and shortness of breath, creatinine of 8.7 and volume overloaded.  HD initiated 01/26/17 after placement TDC in IR.    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 Last creatinine PTA on 11/19/2016 was 6.4.   IR was consulted for placement of permanent HD cath while her fistula matures (8/8).  Patient has had two HD sessions while inpatient.   -CLIP process started  -Has TTS spot at [facility], chair time 1:00PM, can start on Tuesday, arrive 1 hour early that day to sign papers - HD tomorrow then could be discharged to home  afterwards  Electrolytes Hyperkalemia - resolved  Iron deficiency anemia Hemoglobin 6 on admission.  01/26/17 showed ferritin 135, iron 32, TIBC 230.       She received 1 unit of blood during dialysis 8/8, hemoglobin was 7.9 post transfusion Started aranesp 237mg weekly (last does given 8/8). -will need iron replacement with dialysis  Ferrelecit 250 with HD tomorrow, then will get venofer 100 at outpt HD for 7 doses  Type [facility]   On SSI-S  Bones PTH -592 and ionized calcium - 0.73.   -phoslo started.  -Will need hectorol on dialysis days  Start 1 mcg TIW with HD on Saturday    LOS: 2 JBoyd Kerbs PGY-2 Internal medicine Pager: [phone] I have seen and examined this patient and agree with plan and assessment in the above note with renal recommendations/intervention highlighted. [facility] tomorrow then could go home. Give Fe and hectorol with HD on Saturday. [facility]  Geraldin Habermehl B,MD 01/28/2017 12:39 PM

## 2017-01-29 LAB — BPAM RBC
BLOOD PRODUCT EXPIRATION DATE: 201808292359
Blood Product Expiration Date: 201808242359
ISSUE DATE / TIME: 201808082213
ISSUE DATE / TIME: 201808082213
UNIT TYPE AND RH: 1700
Unit Type and Rh: 7300

## 2017-01-29 LAB — CBC
HEMATOCRIT: 32.4 % — AB (ref 36.0–46.0)
HEMOGLOBIN: 10.2 g/dL — AB (ref 12.0–15.0)
MCH: 26.4 pg (ref 26.0–34.0)
MCHC: 31.5 g/dL (ref 30.0–36.0)
MCV: 83.9 fL (ref 78.0–100.0)
Platelets: 221 10*3/uL (ref 150–400)
RBC: 3.86 MIL/uL — ABNORMAL LOW (ref 3.87–5.11)
RDW: 13.5 % (ref 11.5–15.5)
WBC: 6.6 10*3/uL (ref 4.0–10.5)

## 2017-01-29 LAB — TYPE AND SCREEN
ABO/RH(D): B POS
Antibody Screen: NEGATIVE
UNIT DIVISION: 0
UNIT DIVISION: 0

## 2017-01-29 LAB — RENAL FUNCTION PANEL
ANION GAP: 13 (ref 5–15)
Albumin: 2.5 g/dL — ABNORMAL LOW (ref 3.5–5.0)
BUN: 36 mg/dL — ABNORMAL HIGH (ref 6–20)
CHLORIDE: 96 mmol/L — AB (ref 101–111)
CO2: 22 mmol/L (ref 22–32)
Calcium: 8 mg/dL — ABNORMAL LOW (ref 8.9–10.3)
Creatinine, Ser: 7.54 mg/dL — ABNORMAL HIGH (ref 0.44–1.00)
GFR calc Af Amer: 8 mL/min — ABNORMAL LOW (ref >60)
GFR calc non Af Amer: 7 mL/min — ABNORMAL LOW (ref >60)
GLUCOSE: 551 mg/dL — AB (ref 65–99)
POTASSIUM: 4.3 mmol/L (ref 3.5–5.1)
Phosphorus: 4.4 mg/dL (ref 2.5–4.6)
SODIUM: 131 mmol/L — AB (ref 135–145)

## 2017-01-29 LAB — GLUCOSE, CAPILLARY
GLUCOSE-CAPILLARY: 143 mg/dL — AB (ref 65–99)
GLUCOSE-CAPILLARY: 69 mg/dL (ref 65–99)
Glucose-Capillary: 119 mg/dL — ABNORMAL HIGH (ref 65–99)
Glucose-Capillary: 66 mg/dL (ref 65–99)

## 2017-01-29 LAB — PARATHYROID HORMONE, INTACT (NO CA): PTH: 180 pg/mL — AB (ref 15–65)

## 2017-01-29 LAB — CALCIUM, IONIZED: Calcium, Ionized, Serum: 4.6 mg/dL (ref 4.5–5.6)

## 2017-01-29 MED ORDER — KETOROLAC TROMETHAMINE 0.5 % OP SOLN: 1.0000 [drp] | Freq: Four times a day (QID) | OPHTHALMIC | 0 refills | Status: DC

## 2017-01-29 MED ORDER — DOXERCALCIFEROL 4 MCG/2ML IV SOLN
INTRAVENOUS | Status: AC
  Filled 2017-01-29: qty 2

## 2017-01-29 MED ORDER — CALCIUM ACETATE (PHOS BINDER) 667 MG PO CAPS: 1334.0000 mg | ORAL_CAPSULE | Freq: Three times a day (TID) | ORAL | 0 refills | Status: DC

## 2017-01-29 MED ORDER — RENA-VITE PO TABS: 1.0000 | ORAL_TABLET | Freq: Every day | ORAL | 0 refills | Status: AC

## 2017-01-29 MED ORDER — METOPROLOL SUCCINATE ER 25 MG PO TB24: 25.0000 mg | ORAL_TABLET | Freq: Every day | ORAL | 0 refills | Status: DC

## 2017-01-29 MED ORDER — INSULIN GLARGINE 100 UNIT/ML ~~LOC~~ SOLN: 25.0000 [IU] | Freq: Every day | SUBCUTANEOUS | 11 refills | Status: DC

## 2017-01-29 MED ORDER — PREDNISOLONE ACETATE 1 % OP SUSP: 1.0000 [drp] | Freq: Four times a day (QID) | OPHTHALMIC | 0 refills | Status: DC

## 2017-01-29 MED ORDER — INSULIN ASPART 100 UNIT/ML ~~LOC~~ SOLN
20.0000 [IU] | Freq: Once | SUBCUTANEOUS | Status: AC
  Administered 2017-01-29: 20 [IU] via SUBCUTANEOUS

## 2017-01-29 MED ORDER — AMLODIPINE BESYLATE 5 MG PO TABS: 5.0000 mg | ORAL_TABLET | Freq: Every day | ORAL | Status: DC

## 2017-01-29 MED ORDER — INSULIN ASPART 100 UNIT/ML ~~LOC~~ SOLN: 0.0000 [IU] | Freq: Three times a day (TID) | SUBCUTANEOUS | 11 refills | Status: AC

## 2017-01-29 MED ORDER — ONDANSETRON HCL 4 MG/2ML IJ SOLN
INTRAMUSCULAR | Status: AC
  Filled 2017-01-29: qty 2

## 2017-01-29 MED ORDER — NORETHINDRONE 0.35 MG PO TABS: 1.0000 | ORAL_TABLET | Freq: Every day | ORAL | 11 refills | Status: DC

## 2017-01-29 NOTE — Progress Notes (Signed)
Discharge instructions and medications discussed with patient.  Blue hemostat given to patient.  All questions answered.

## 2017-01-29 NOTE — Progress Notes (Signed)
Hypoglycemic Event  CBG: 69  Treatment: carb snack  Symptoms: none  Follow-up CBG: Time:13:09 CBG Result: 143  Possible Reasons for Event: unknow  Comments/MD notified: yes    Lezlie Octave

## 2017-01-29 NOTE — Discharge Summary (Signed)
Physician Discharge Summary  [name] [id number]:[id number] DOB: May 10, 1991 DOA: 01/25/2017  PCP: System, Pcp Not In  Admit date: 01/25/2017 Discharge date: 01/29/2017  Admitted From: home Disposition: home  Recommendations for Outpatient Follow-up:  1. Follow up with PCP in 1-2 weeks 2. Please obtain BMP/CBC in one week 3. Please follow up on the following pending results:  Home Health: Equipment/Devices:none  Discharge Condition:stable CODE STATUS:full Diet recommendation: Heart healthy   Brief/Interim Summary:25 yo with new onset esrd and started on hd.waiting for transplant.received dialysis in house.   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   calcium acetate 667 MG capsule Commonly known as:  PHOSLO Take 2 capsules (1,334 mg total) by mouth 3 (three) times daily with meals.   insulin aspart 100 UNIT/ML injection Commonly known as:  novoLOG Inject 0-9 Units into the skin 3 (three) times daily with meals. What changed:  how much to take  how to take this  when to take this  additional instructions   insulin glargine 100 UNIT/ML injection Commonly known as:  LANTUS Inject 0.25 mLs (25 Units total) into the skin at bedtime. What changed:  how much to take   ketorolac 0.5 % ophthalmic solution Commonly known as:  ACULAR Place 1 drop  into both eyes 4 (four) times daily.   metoprolol succinate 25 MG 24 hr tablet Commonly known as:  TOPROL-XL Take 1 tablet (25 mg total) by mouth daily.   multivitamin Tabs tablet Take 1 tablet by mouth at bedtime.   norethindrone 0.35 MG tablet Commonly known as:  MICRONOR,CAMILA,ERRIN Take 1 tablet (0.35 mg total) by mouth daily.   prednisoLONE acetate 1 % ophthalmic suspension Commonly known as:  PRED FORTE Place 1 drop into both eyes 4 (four) times daily.   sodium bicarbonate 650 MG tablet Take 1,300 mg by mouth 3 (three) times daily.       No Known Allergies  Consultations:nephrology   Procedures/Studies: Dg Chest 2 View  Result Date: 01/25/2017 CLINICAL DATA:  Productive cough x2 days. Left-sided chest pain. Dialysis patient. EXAM: CHEST  2 VIEW COMPARISON:  04/24/2014 FINDINGS: Slightly low lung volumes with mild cardiac enlargement. No aortic aneurysm. Patchy airspace disease in the left upper and both lower lobes with trace bilateral pleural effusions left greater than right. Mild interstitial edema is also noted. No acute osseous abnormality. IMPRESSION: 1. Findings suggest multilobar pneumonia more so the left upper and right lower lobes. 2. Trace bilateral pleural effusions, left greater than right. 3. Mild interstitial edema and cardiomegaly. Electronically Signed   By: Ashley Royalty M.D.   On: 01/25/2017 19:46   Ir Fluoro Guide Cv Line Right  Result Date: 01/27/2017 INDICATION: 26 year old female with progressive chronic kidney disease now in need of hemodialysis. EXAM: TUNNELED CENTRAL VENOUS HEMODIALYSIS CATHETER PLACEMENT WITH ULTRASOUND AND FLUOROSCOPIC GUIDANCE  MEDICATIONS: 2 g Ancef . The antibiotic was given in an appropriate time interval prior to skin puncture. ANESTHESIA/SEDATION: Moderate (conscious) sedation was employed during this procedure. A total of Versed 1 mg and Fentanyl 75 mcg was administered intravenously. Moderate Sedation Time: 25 minutes. The  patient's level of consciousness and vital signs were monitored continuously by radiology nursing throughout the procedure under my direct supervision. FLUOROSCOPY TIME:  Fluoroscopy Time: 0 minutes 36 seconds (2 mGy). COMPLICATIONS: None immediate. PROCEDURE: Informed written consent was obtained from the patient after a discussion of the risks, benefits, and alternatives to treatment. Questions regarding the procedure were encouraged and answered. The right neck and chest were prepped with chlorhexidine in a sterile fashion, and a sterile drape was applied covering the operative field. Maximum barrier sterile technique with sterile gowns and gloves were used for the procedure. A timeout was performed prior to the initiation of the procedure. After creating a small venotomy incision, a micropuncture kit was utilized to access the right internal jugular vein under direct, real-time ultrasound guidance after the overlying soft tissues were anesthetized with 1% lidocaine with epinephrine. Ultrasound image documentation was performed. The microwire was kinked to measure appropriate catheter length. A stiff Glidewire was advanced to the level of the IVC and the micropuncture sheath was exchanged for a peel-away sheath. A Palindrome tunneled hemodialysis catheter measuring 19 cm from tip to cuff was tunneled in a retrograde fashion from the anterior chest wall to the venotomy incision. The catheter was then placed through the peel-away sheath with tips ultimately positioned within the superior aspect of the right atrium. Final catheter positioning was confirmed and documented with a spot radiographic image. The catheter aspirates and flushes normally. The catheter was flushed with appropriate volume heparin dwells. The catheter exit site was secured with a 0-Prolene retention suture. The venotomy incision was closed with an interrupted 4-0 Vicryl, and reinforced with derma bond. Dressings were applied. The patient  tolerated the procedure well without immediate post procedural complication. IMPRESSION: Successful placement of 19 cm tip to cuff tunneled hemodialysis catheter via the right internal jugular vein with tips terminating within the superior aspect of the right atrium. The catheter is ready for immediate use. Electronically Signed   By: Jacqulynn Cadet M.D.   On: 01/27/2017 11:42   Ir US Guide Vasc Access Right  Result Date: 01/27/2017 INDICATION: 26 year old female with progressive chronic kidney disease now in need of hemodialysis. EXAM: TUNNELED CENTRAL VENOUS HEMODIALYSIS CATHETER PLACEMENT WITH ULTRASOUND AND FLUOROSCOPIC GUIDANCE MEDICATIONS: 2 g Ancef . The antibiotic was given in an appropriate time interval prior to skin puncture. ANESTHESIA/SEDATION: Moderate (conscious) sedation was employed during this procedure. A total of Versed 1 mg and Fentanyl 75 mcg was administered intravenously. Moderate Sedation Time: 25 minutes. The patient's level of consciousness and vital signs were monitored continuously by radiology nursing throughout the procedure under my direct supervision. FLUOROSCOPY TIME:  Fluoroscopy Time: 0 minutes 36 seconds (2 mGy). COMPLICATIONS: None immediate. PROCEDURE: Informed written consent was obtained from the patient after a discussion of the risks, benefits, and alternatives to treatment. Questions regarding the procedure were encouraged and answered. The right neck and chest were prepped with chlorhexidine in a sterile fashion, and a sterile drape was applied covering the operative field. Maximum barrier sterile technique with sterile gowns and gloves were used for the procedure. A timeout was performed prior to the initiation of the procedure. After creating a small venotomy incision, a micropuncture kit was utilized to access the  right internal jugular vein under direct, real-time ultrasound guidance after the overlying soft tissues were anesthetized with 1% lidocaine with  epinephrine. Ultrasound image documentation was performed. The microwire was kinked to measure appropriate catheter length. A stiff Glidewire was advanced to the level of the IVC and the micropuncture sheath was exchanged for a peel-away sheath. A Palindrome tunneled hemodialysis catheter measuring 19 cm from tip to cuff was tunneled in a retrograde fashion from the anterior chest wall to the venotomy incision. The catheter was then placed through the peel-away sheath with tips ultimately positioned within the superior aspect of the right atrium. Final catheter positioning was confirmed and documented with a spot radiographic image. The catheter aspirates and flushes normally. The catheter was flushed with appropriate volume heparin dwells. The catheter exit site was secured with a 0-Prolene retention suture. The venotomy incision was closed with an interrupted 4-0 Vicryl, and reinforced with derma bond. Dressings were applied. The patient tolerated the procedure well without immediate post procedural complication. IMPRESSION: Successful placement of 19 cm tip to cuff tunneled hemodialysis catheter via the right internal jugular vein with tips terminating within the superior aspect of the right atrium. The catheter is ready for immediate use. Electronically Signed   By: Jacqulynn Cadet M.D.   On: 01/27/2017 11:42   Dg Chest Port 1 View  Result Date: 01/26/2017 CLINICAL DATA:  Hemodialysis catheter placement.  Initial encounter. EXAM: PORTABLE CHEST 1 VIEW COMPARISON:  Chest radiograph performed 01/25/2017 FINDINGS: The patient's right-sided dual-lumen catheter is noted ending about the cavoatrial junction. Bibasilar airspace opacities, left greater than right, may reflect pneumonia or interstitial edema. Small bilateral pleural effusions are seen. No pneumothorax is identified. The cardiomediastinal silhouette is mildly enlarged. No acute osseous abnormalities are seen. IMPRESSION: 1. Right-sided dual-lumen  catheter noted ending about the cavoatrial junction. 2. Bibasilar airspace opacities, left greater than right, may reflect pneumonia or interstitial edema. Small bilateral pleural effusions seen. Mild cardiomegaly. Electronically Signed   By: Garald Balding M.D.   On: 01/26/2017 21:45   (Echo, Carotid, EGD, Colonoscopy, ERCP)    Subjective:   Discharge Exam: Vitals:   01/29/17 0710 01/29/17 0715  BP: (!) 167/90 (!) 168/88  Pulse: 92 93  Resp: 16 16  Temp:    SpO2:     Vitals:   01/29/17 0500 01/29/17 0658 01/29/17 0710 01/29/17 0715  BP: (!) 156/79 (!) 168/85 (!) 167/90 (!) 168/88  Pulse: 91 92 92 93  Resp: _0 Temp: 99.3 F (37.4 C) 99.3 F (37.4 C)    TempSrc: Oral Oral    SpO2: 97% 98%    Weight:  58.1 kg (128 lb 1.4 oz)    Height:        General: Pt is alert, awake, not in acute distress Cardiovascular: RRR, S1/S2 +, no rubs, no gallops Respiratory: CTA bilaterally, no wheezing, no rhonchi Abdominal: Soft, NT, ND, bowel sounds + Extremities: no edema, no cyanosis    The results of significant diagnostics from this hospitalization (including imaging, microbiology, ancillary and laboratory) are listed below for reference.     Microbiology: Recent Results (from the past 240 hour(s))  MRSA PCR Screening     Status: None   Collection Time: 01/27/17 12:46 PM  Result Value Ref Range Status   MRSA by PCR NEGATIVE NEGATIVE Final    Comment:        The GeneXpert MRSA Assay (FDA approved for NASAL specimens only), is one component of a comprehensive MRSA  colonization surveillance program. It is not intended to diagnose MRSA infection nor to guide or monitor treatment for MRSA infections.      Labs: BNP (last 3 results)  Recent Labs  01/25/17 2026  BNP 536.6*   Basic Metabolic Panel:  Recent Labs Lab 01/25/17 2026 01/25/17 2114 01/26/17 0706 01/26/17 2222 01/27/17 0336 01/29/17 0730  NA 140 138 138 136 138 131*  K 5.5* 5.3* 5.3* 5.1 3.9  4.3  CL 111 114* 110 106 105 96*  CO2 17*  --  18* 20* 20* 22  GLUCOSE 82 103* 67 138* 154* 551*  BUN 47* 43* 48* 44* 27* 36*  CREATININE 8.78* 9.70* 9.03* 9.07* 6.54* 7.54*  CALCIUM 6.8*  --  6.6* 6.5* 8.2* 8.0*  PHOS  --   --   --  5.2* 4.5 4.4   Liver Function Tests:  Recent Labs Lab 01/26/17 2221 01/26/17 2222 01/27/17 0336 01/29/17 0730  ALT 9*  --   --   --   ALBUMIN  --  2.2* 2.6* 2.5*   No results for input(s): LIPASE, AMYLASE in the last 168 hours. No results for input(s): AMMONIA in the last 168 hours. CBC:  Recent Labs Lab 01/25/17 2026 01/25/17 2114 01/26/17 0706 01/26/17 2221 01/27/17 0336 01/29/17 0730  WBC 6.7  --  6.1 6.3 7.0 6.6  NEUTROABS 4.9  --   --   --   --   --   HGB 6.6* 6.5* 6.0* 5.6* 7.9* 10.2*  HCT 20.3* 19.0* 18.5* 17.8* 24.7* 32.4*  MCV 83.9  --  84.5 84.8 83.2 83.9  PLT 284  --  275 243 270 221   Cardiac Enzymes: No results for input(s): CKTOTAL, CKMB, CKMBINDEX, TROPONINI in the last 168 hours. BNP: Invalid input(s): POCBNP CBG:  Recent Labs Lab 01/27/17 2114 01/28/17 0733 01/28/17 1313 01/28/17 1637 01/28/17 2139  GLUCAP 171* 481* 304* 234* 318*   D-Dimer No results for input(s): DDIMER in the last 72 hours. Hgb A1c No results for input(s): HGBA1C in the last 72 hours. Lipid Profile No results for input(s): CHOL, HDL, LDLCALC, TRIG, CHOLHDL, LDLDIRECT in the last 72 hours. Thyroid function studies No results for input(s): TSH, T4TOTAL, T3FREE, THYROIDAB in the last 72 hours.  Invalid input(s): FREET3 Anemia work up  Recent Labs  01/26/17 2221  FERRITIN 135  TIBC 230*  IRON 32   Urinalysis    Component Value Date/Time   COLORURINE YELLOW 04/07/2014 [place] 04/07/2014 1457   LABSPEC 1.025 04/07/2014 1457   PHURINE 5.0 04/07/2014 1457   GLUCOSEU >1000 (A) 04/07/2014 1457   HGBUR TRACE (A) 04/07/2014 1457   BILIRUBINUR NEGATIVE 04/07/2014 1457   KETONESUR 40 (A) 04/07/2014 1457    PROTEINUR NEGATIVE 04/07/2014 1457   UROBILINOGEN 0.2 04/07/2014 1457   NITRITE NEGATIVE 04/07/2014 1457   LEUKOCYTESUR NEGATIVE 04/07/2014 1457   Sepsis Labs Invalid input(s): PROCALCITONIN,  WBC,  LACTICIDVEN Microbiology Recent Results (from the past 240 hour(s))  MRSA PCR Screening     Status: None   Collection Time: 01/27/17 12:46 PM  Result Value Ref Range Status   MRSA by PCR NEGATIVE NEGATIVE Final    Comment:        The GeneXpert MRSA Assay (FDA approved for NASAL specimens only), is one component of a comprehensive MRSA colonization surveillance program. It is not intended to diagnose MRSA infection nor to guide or monitor treatment for MRSA infections.      Time coordinating discharge: Over 30  minutes  SIGNED:   Georgette Shell, MD  [facility] 01/29/2017, 8:11 AM Pager   If 7PM-7AM, please contact night-coverage [email or web address] Password [email or web address]

## 2017-01-29 NOTE — Progress Notes (Signed)
Hypoglycemic Event  CBG: 66  Treatment: carb snack  Symptoms: none  Follow-up CBG: Time:12:42 CBG Result:69  Possible Reasons for Event: unknow  Comments/MD notified:yes    [name]

## 2017-01-29 NOTE — Procedures (Signed)
I have personally attended this patient's dialysis session.  Volume looks good Post weight will be EDW 2K bath Tight heparin Labs pending   Jamal Maes, MD [place] Pager 01/29/2017, 8:11 AM

## 2017-01-29 NOTE — Progress Notes (Signed)
[facility] Rounding Note  Subjective/Interval History:   HD #3 today for ESRD 2/2 DM Feeling well, anxious for disharge  Objective: Vital signs in last 24 hours:  Temp:  [98.9 F (37.2 C)-99.3 F (37.4 C)] 99.3 F (37.4 C) (08/11 0658) Pulse Rate:  [89-93] 93 (08/11 0715) Resp:  [16-18] 16 (08/11 0715) BP: (134-168)/(76-90) 168/88 (08/11 0715) SpO2:  [97 %-98 %] 98 % (08/11 0658) Weight:  [58.1 kg (128 lb 1.4 oz)-58.6 kg (129 lb 3 oz)] 58.1 kg (128 lb 1.4 oz) (08/11 0658) Weight change: -3.4 kg (-7 lb 7.9 oz)   Physical Examination:  Seen in the HD unit, no distress, on HD via [facility] Physical Exam  Constitutional: She is well-developed, well-nourished, and in no distress.  Cardiovascular: Normal rate, regular rhythm and normal heart sounds.  Exam reveals no gallop and no friction rub.   No murmur heard. Pulmonary/Chest: Effort normal and breath sounds normal. No respiratory distress. She has no wheezes. She has no rales.  Skin: Skin is warm and dry.  Left upper arm AVF (7/25 BVT [facility]) with audible bruit   Recent Labs  01/26/17 2221 01/27/17 0336 01/29/17 0730  WBC 6.3 7.0 6.6  HGB 5.6* 7.9* 10.2*  HCT 17.8* 24.7* 32.4*  PLT 243 270 221    Recent Labs  01/26/17 2222 01/27/17 0336  NA 136 138  K 5.1 3.9  CL 106 105  CO2 20* 20*  GLUCOSE 138* 154*  BUN 44* 27*  CREATININE 9.07* 6.54*  CALCIUM 6.5* 8.2*  PHOS 5.2* 4.5    Recent Labs  01/26/17 2221  01/27/17 0336  ALBUMIN  --   < > 2.6*  ALT 9*  --   --   < > = values in this interval not displayed.   Recent Labs  01/26/17 1040  LABPROT 15.0  INR 1.18   Iron/TIBC/Ferritin/ %Sat    Component Value Date/Time   IRON 32 01/26/2017 2221   TIBC 230 (L) 01/26/2017 2221   FERRITIN 135 01/26/2017 2221   IRONPCTSAT 14 01/26/2017 2221    Lab Results  Component Value Date   PTH 180 (H) 01/27/2017   CALCIUM 8.2 (L) 01/27/2017   CAION 0.73 (LL) 01/25/2017   PHOS 4.5 01/27/2017   Medications . amLODipine   2.5 mg Oral Daily  . calcium acetate  1,334 mg Oral TID WC  . darbepoetin (ARANESP) injection - DIALYSIS  200 mcg Intravenous Q Wed-HD  . doxercalciferol  1 mcg Intravenous Q T,Th,Sa-HD  . heparin  5,000 Units Subcutaneous Q8H  . insulin aspart  0-9 Units Subcutaneous TID WC  . insulin glargine  5 Units Subcutaneous QHS  . ketorolac  1 drop Both Eyes QID  . metoprolol succinate  25 mg Oral Daily  . multivitamin  1 tablet Oral QHS  . norethindrone  1 tablet Oral Daily  . prednisoLONE acetate  1 drop Both Eyes QID  . senna  1 tablet Oral Daily   Background:  Ms. Mckibbin is a type I diabetic, CKD 5 followed by [facility].  Rapid progression of her disease between 03/18-6/18 - last creatinine PTA 11/2016 6.4 Newly placed LUE basilic vein transposition AVF 7/25 [facility]; active on transplant list) Presented to ED with 1 day history of increased productive cough and shortness of breath, creatinine of 8.7 and volume overload.  HD initiated 01/26/17 after placement TDC in IR.    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 Danne Baxter at [facility] (01/12/18) Already has f/u scheduled for 03/08/17 [facility]  Anemia/Fe def component Hemoglobin 6 on admission., TSat 14 S/p 1U PRBC's 8/8 for Hb 5.9 Aranesp 200 given 8/8 (will be converted to Mircera outpt) No need to include this in D/C med list Ferrlecit with HD today (250) then 7 more doses venofer at outpt HD unit for full load.    Type I diabetes mellitus Follows [facility].   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 Dialysis and renal medication details as outlined above  Jamal Maes, MD [facility] [phone] Pager 01/29/2017, 8:07 AM

## 2017-01-30 ENCOUNTER — Telehealth: Payer: Self-pay | Admitting: *Deleted

## 2017-01-30 NOTE — Telephone Encounter (Signed)
Zigmund Daniel, MD is a fairly new Hospitalist and may be in process of approval with MEDICAID. I WILL IN BASKET HER AND LET HER KNOW.  In the mean time I have given Domenic Polite, MD NPI to [place] so pt can have Rxs filled as Jacinta Shoe, MD admitted pt and [facility] discharged pt. No further CM needs. Made pt aware of changes and that Rx should be ready.

## 2017-01-30 NOTE — Telephone Encounter (Signed)
Unable to reach MD by In Basket

## 2017-01-31 DIAGNOSIS — D689 Coagulation defect, unspecified: Secondary | ICD-10-CM | POA: Insufficient documentation

## 2017-01-31 DIAGNOSIS — N189 Chronic kidney disease, unspecified: Secondary | ICD-10-CM | POA: Insufficient documentation

## 2017-01-31 DIAGNOSIS — N2581 Secondary hyperparathyroidism of renal origin: Secondary | ICD-10-CM | POA: Insufficient documentation

## 2017-02-24 DIAGNOSIS — Z23 Encounter for immunization: Secondary | ICD-10-CM | POA: Insufficient documentation

## 2017-03-05 ENCOUNTER — Encounter (HOSPITAL_COMMUNITY): Payer: Self-pay | Admitting: Emergency Medicine

## 2017-03-05 ENCOUNTER — Emergency Department (HOSPITAL_COMMUNITY)
Admission: EM | Admit: 2017-03-05 | Discharge: 2017-03-05 | Disposition: A | Discharge status: Home / Self Care | Payer: Medicaid Other | Attending: Emergency Medicine | Admitting: Emergency Medicine

## 2017-03-05 DIAGNOSIS — I120 Hypertensive chronic kidney disease with stage 5 chronic kidney disease or end stage renal disease: Secondary | ICD-10-CM | POA: Diagnosis not present

## 2017-03-05 DIAGNOSIS — Z79899 Other long term (current) drug therapy: Secondary | ICD-10-CM | POA: Insufficient documentation

## 2017-03-05 DIAGNOSIS — N186 End stage renal disease: Secondary | ICD-10-CM | POA: Diagnosis not present

## 2017-03-05 DIAGNOSIS — Z7982 Long term (current) use of aspirin: Secondary | ICD-10-CM | POA: Diagnosis not present

## 2017-03-05 DIAGNOSIS — Z452 Encounter for adjustment and management of vascular access device: Secondary | ICD-10-CM | POA: Diagnosis not present

## 2017-03-05 DIAGNOSIS — E1040 Type 1 diabetes mellitus with diabetic neuropathy, unspecified: Secondary | ICD-10-CM | POA: Diagnosis not present

## 2017-03-05 DIAGNOSIS — Z992 Dependence on renal dialysis: Secondary | ICD-10-CM | POA: Insufficient documentation

## 2017-03-05 DIAGNOSIS — Z5189 Encounter for other specified aftercare: Secondary | ICD-10-CM | POA: Insufficient documentation

## 2017-03-05 DIAGNOSIS — I770 Arteriovenous fistula, acquired: Secondary | ICD-10-CM | POA: Insufficient documentation

## 2017-03-05 NOTE — Discharge Instructions (Signed)
Change the dressing, tomorrow.  Return here if needed, for continued bleeding.  If the wound is still bleeding by Monday, call your surgeon at [facility] to arrange a follow-up appointment.

## 2017-03-05 NOTE — ED Triage Notes (Signed)
Pt had a fistula placed in her right arm on 02/22/17, has had oozing at the site since, states today bleeding has increased and won't stop.

## 2017-03-05 NOTE — ED Provider Notes (Signed)
[facility] DEPT Provider Note   CSN: [id number] Arrival date & time: 03/05/17  1720     History   Chief Complaint Chief Complaint  Patient presents with  . Vascular Access Problem    HPI [name] is a 26 y.o. female.   She is here for evaluation of a bleeding wound.  She had surgery 02/22/17, for fistula left upper arm, to use for dialysis.  She is getting routine dialysis.  Wound has been bleeding somewhat over the last several days, but began to bleed more today.  she denies weakness, dizziness, chest pain, shortness of breath, nausea, vomiting, fever or chills.  There are no other known modifying factors.   HPI  Past Medical History:  Diagnosis Date  . Cardiac arrest ([place])   . Complication of anesthesia   . Diabetes mellitus type 1 ([place])    2005 dx [facility]  . DKA (diabetic ketoacidoses) ([place])   . ESRD (end stage renal disease) ([place])   . Hypertension   . PONV (postoperative nausea and vomiting)     Patient Active Problem List   Diagnosis Date Noted  . Normocytic normochromic anemia 01/26/2017  . ESRD (end stage renal disease) ([place]) 01/26/2017  . Fluid overload 01/26/2017  . Depression 07/04/2014  . Hemodialysis-associated hypotension 04/24/2014  . Cerebral infarction due to thrombosis of right middle cerebral artery ([place]) 04/24/2014  . Diabetes mellitus due to underlying condition without complications ([place]) 18/56/3149  . History of anemia 04/17/2014  . Cardiac arrest ([place]) 04/14/2014  . Diabetic neuropathy ([place]) 02/14/2014  . DKA, type 1 ([place]) 02/12/2014  . Tachycardia 02/12/2014  . DOE (dyspnea on exertion) 02/12/2014  . Abdominal pain 02/12/2014  . IDDM (insulin dependent diabetes mellitus) ([place]) 02/12/2014  . 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 Hypokalemia 12/07/2013  . Noncompliance 12/06/2013  . DKA (diabetic ketoacidoses) ([place]) 12/05/2013  . Leukocytosis 12/05/2013  . Acute on  chronic renal failure ([place]) 12/05/2013  . Hypercalcemia 12/05/2013  . Abnormal LFTs 12/05/2013  . Elevated lipase 12/05/2013    Past Surgical History:  Procedure Laterality Date  . CATARACT EXTRACTION     6 years ago  . DIALYSIS FISTULA CREATION    . IR FLUORO GUIDE CV LINE RIGHT  01/26/2017  . IR US GUIDE VASC ACCESS RIGHT  01/26/2017    OB History    No data available       Home Medications    Prior to Admission medications   Medication Sig Start Date End Date Taking? Authorizing Provider  amLODipine (NORVASC) 2.5 MG tablet Take 2.5 mg by mouth daily.    [provider]  calcium acetate (PHOSLO) 667 MG capsule Take 2 capsules (1,334 mg total) by mouth 3 (three) times daily with meals. 01/29/17   Georgette Shell, MD  insulin aspart (NOVOLOG) 100 UNIT/ML injection Inject 0-9 Units into the skin 3 (three) times daily with meals. 01/29/17   Georgette Shell, MD  insulin glargine (LANTUS) 100 UNIT/ML injection Inject 0.25 mLs (25 Units total) into the skin at bedtime. 01/29/17   Georgette Shell, MD  ketorolac (ACULAR) 0.5 % ophthalmic solution Place 1 drop into both eyes 4 (four) times daily. 01/29/17   Georgette Shell, MD  metoprolol succinate (TOPROL-XL) 25 MG 24 hr tablet Take 1 tablet (25 mg total) by mouth daily. 01/29/17   Georgette Shell, MD  multivitamin (RENA-VIT) TABS tablet Take 1 tablet by mouth at bedtime. 01/29/17  Georgette Shell, MD  norethindrone (MICRONOR,CAMILA,ERRIN) 0.35 MG tablet Take 1 tablet (0.35 mg total) by mouth daily. 01/29/17   Georgette Shell, MD  prednisoLONE acetate (PRED FORTE) 1 % ophthalmic suspension Place 1 drop into both eyes 4 (four) times daily. 01/29/17   Georgette Shell, MD  sodium bicarbonate 650 MG tablet Take 1,300 mg by mouth 3 (three) times daily.    [provider]    Family History Family History  Problem Relation Age of Onset  . Diabetes Maternal Grandmother   . Stroke Maternal  Grandmother   . Cancer Maternal Aunt        aunt, passed last month, ?RCC  . Cancer Maternal Uncle     Social History Social History  Substance Use Topics  . Smoking status: Never Smoker  . Smokeless tobacco: Never Used  . Alcohol use No     Allergies   Patient has no known allergies.   Review of Systems Review of Systems  All other systems reviewed and are negative.    Physical Exam Updated Vital Signs BP (!) 138/102   Pulse (!) 104   Temp 98.2 F (36.8 C) (Oral)   Resp 20   Ht 5\' 1"  (1.549 m)   Wt 57 kg (125 lb 10.6 oz)   LMP 02/26/2017   SpO2 100%   BMI 23.74 kg/m   Physical Exam  Constitutional: She is oriented to person, place, and time. She appears well-developed and well-nourished.  HENT:  Head: Normocephalic and atraumatic.  Eyes: Pupils are equal, round, and reactive to light. Conjunctivae and EOM are normal.  Neck: Normal range of motion and phonation normal. Neck supple.  Cardiovascular: Normal rate.   Pulmonary/Chest: Effort normal. She exhibits no tenderness.  Musculoskeletal: Normal range of motion.  Wound left upper medial arm, longitudinal, with mild bleeding along the incisional line.  There is wound glue, present however it is lifted up and there is a small amount of blood oozing from the middle aspect of the wound.  When pressure is placed adjacent to this area, a small amount of blood is extruded from the incision in the midline.  There are no other associated areas, which tend to cause the bleeding.  There is ecchymosis and swelling associated with the incision, typical of usual post fistula surgery tissue changes.  She is neurovascular intact distally in the left hand.  Neurological: She is alert and oriented to person, place, and time. She exhibits normal muscle tone.  Skin: Skin is warm and dry.  Psychiatric: She has a normal mood and affect. Her behavior is normal. Judgment and thought content normal.  Nursing note and vitals  reviewed.    ED Treatments / Results  Labs (all labs ordered are listed, but only abnormal results are displayed) Labs Reviewed - No data to display  EKG  EKG Interpretation None       Radiology No results found.  Procedures Procedures (including critical care time)  Medications Ordered in ED Medications - No data to display   Initial Impression / Assessment and Plan / ED Course  I have reviewed the triage vital signs and the nursing notes.  Pertinent labs & imaging results that were available during my care of the patient were reviewed by me and considered in my medical decision making (see chart for details).  Clinical Course as of Mar 07 55  Sat Mar 05, 2017  2113 Wound care, by me: Dressing which the patient had placed was removed,  and the wound was cleansed with saline using sterile gauze.  There was no active pulsatile bleeding.  The wound was made dry, and a dressing was placed using a rolled up 4 x 4, on the middle aspect of the wound, and ABD pad over that, and a light pressure Coban dressing, which was applied by me.  Evaluation of the distal left extremity post dressing placement revealed normal sensation, circulation, and function at the left wrist and hand.  [EW]    Clinical Course User Index [EW] Daleen Bo, MD     Patient Vitals for the past 24 hrs:  BP Temp Temp src Pulse Resp SpO2 Height Weight  03/05/17 2200 (!) 138/102 - - (!) 104 - 100 % - -  03/05/17 2145 (!) 146/95 - - (!) 102 - 100 % - -  03/05/17 2130 130/88 - - (!) 101 - 100 % - -  03/05/17 2115 130/88 - - (!) 102 - 100 % - -  03/05/17 2100 139/89 - - (!) 101 - 100 % - -  03/05/17 2045 (!) 169/90 - - (!) 104 - 100 % - -  03/05/17 2030 (!) 156/96 - - (!) 105 - 100 % - -  03/05/17 1945 (!) 152/93 - - 100 - 100 % - -  03/05/17 1930 (!) 155/95 - - (!) 101 - 100 % - -  03/05/17 1746 - - - - - - 5\' 1"  (1.549 m) 57 kg (125 lb 10.6 oz)  03/05/17 1735 (!) 152/94 98.2 F (36.8 C) Oral (!) 102  20 99 % - -    9:12 PM Reevaluation with update and discussion. After initial assessment and treatment, an updated evaluation reveals she is comfortable and has no further complaints.  Findings discussed with the patient and all questions were answered. Kelyse Pask L     Final Clinical Impressions(s) / ED Diagnoses   Final diagnoses:  Visit for wound check   Postoperative wound bleeding without expected surgical complication.  Most likely there is subcutaneous blood collection, and not active bleeding.   Nursing Notes Reviewed/ Care Coordinated Applicable Imaging Reviewed Interpretation of Laboratory Data incorporated into ED treatment  The patient appears reasonably screened and/or stabilized for discharge and I doubt any other medical condition or other [facility] requiring further screening, evaluation, or treatment in the ED at this time prior to discharge.  Plan: Home Medications-continue current medications; Home Treatments-rest, fluids; return here if the recommended treatment, does not improve the symptoms; Recommended follow up-follow-up with vascular surgeon if needed for continued bleeding    New Prescriptions Discharge Medication List as of 03/05/2017  9:50 PM       Daleen Bo, MD 03/06/17 [phone]

## 2017-03-29 DIAGNOSIS — R519 Headache, unspecified: Secondary | ICD-10-CM | POA: Insufficient documentation

## 2017-03-29 DIAGNOSIS — R52 Pain, unspecified: Secondary | ICD-10-CM | POA: Insufficient documentation

## 2017-05-13 ENCOUNTER — Emergency Department (HOSPITAL_COMMUNITY): Payer: Medicaid Other

## 2017-05-13 ENCOUNTER — Other Ambulatory Visit: Payer: Self-pay

## 2017-05-13 ENCOUNTER — Inpatient Hospital Stay (HOSPITAL_COMMUNITY)
Admission: EM | Admit: 2017-05-13 | Discharge: 2017-05-16 | DRG: 193 | Disposition: A | Discharge status: Home / Self Care | Payer: Medicaid Other | Attending: Internal Medicine | Admitting: Internal Medicine

## 2017-05-13 ENCOUNTER — Encounter (HOSPITAL_COMMUNITY): Payer: Self-pay | Admitting: *Deleted

## 2017-05-13 DIAGNOSIS — Z1624 Resistance to multiple antibiotics: Secondary | ICD-10-CM | POA: Diagnosis present

## 2017-05-13 DIAGNOSIS — I1311 Hypertensive heart and chronic kidney disease without heart failure, with stage 5 chronic kidney disease, or end stage renal disease: Secondary | ICD-10-CM | POA: Diagnosis present

## 2017-05-13 DIAGNOSIS — Z5309 Procedure and treatment not carried out because of other contraindication: Secondary | ICD-10-CM | POA: Diagnosis present

## 2017-05-13 DIAGNOSIS — E1010 Type 1 diabetes mellitus with ketoacidosis without coma: Secondary | ICD-10-CM

## 2017-05-13 DIAGNOSIS — Z794 Long term (current) use of insulin: Secondary | ICD-10-CM

## 2017-05-13 DIAGNOSIS — R042 Hemoptysis: Secondary | ICD-10-CM | POA: Diagnosis present

## 2017-05-13 DIAGNOSIS — R000 Tachycardia, unspecified: Secondary | ICD-10-CM | POA: Diagnosis present

## 2017-05-13 DIAGNOSIS — E1065 Type 1 diabetes mellitus with hyperglycemia: Secondary | ICD-10-CM | POA: Diagnosis present

## 2017-05-13 DIAGNOSIS — E1029 Type 1 diabetes mellitus with other diabetic kidney complication: Secondary | ICD-10-CM

## 2017-05-13 DIAGNOSIS — I517 Cardiomegaly: Secondary | ICD-10-CM | POA: Diagnosis present

## 2017-05-13 DIAGNOSIS — E8770 Fluid overload, unspecified: Secondary | ICD-10-CM | POA: Diagnosis present

## 2017-05-13 DIAGNOSIS — D631 Anemia in chronic kidney disease: Secondary | ICD-10-CM | POA: Diagnosis present

## 2017-05-13 DIAGNOSIS — N186 End stage renal disease: Secondary | ICD-10-CM | POA: Diagnosis present

## 2017-05-13 DIAGNOSIS — E1022 Type 1 diabetes mellitus with diabetic chronic kidney disease: Secondary | ICD-10-CM | POA: Diagnosis present

## 2017-05-13 DIAGNOSIS — N2581 Secondary hyperparathyroidism of renal origin: Secondary | ICD-10-CM | POA: Diagnosis present

## 2017-05-13 DIAGNOSIS — J189 Pneumonia, unspecified organism: Secondary | ICD-10-CM | POA: Diagnosis present

## 2017-05-13 DIAGNOSIS — R0902 Hypoxemia: Secondary | ICD-10-CM | POA: Diagnosis present

## 2017-05-13 DIAGNOSIS — Y95 Nosocomial condition: Secondary | ICD-10-CM | POA: Diagnosis present

## 2017-05-13 DIAGNOSIS — IMO0002 Reserved for concepts with insufficient information to code with codable children: Secondary | ICD-10-CM

## 2017-05-13 DIAGNOSIS — Z8674 Personal history of sudden cardiac arrest: Secondary | ICD-10-CM

## 2017-05-13 DIAGNOSIS — Y92238 Other place in hospital as the place of occurrence of the external cause: Secondary | ICD-10-CM | POA: Diagnosis present

## 2017-05-13 DIAGNOSIS — J181 Lobar pneumonia, unspecified organism: Principal | ICD-10-CM | POA: Diagnosis present

## 2017-05-13 DIAGNOSIS — Y838 Other surgical procedures as the cause of abnormal reaction of the patient, or of later complication, without mention of misadventure at the time of the procedure: Secondary | ICD-10-CM | POA: Diagnosis present

## 2017-05-13 DIAGNOSIS — L7602 Intraoperative hemorrhage and hematoma of skin and subcutaneous tissue complicating other procedure: Secondary | ICD-10-CM | POA: Diagnosis present

## 2017-05-13 DIAGNOSIS — Z992 Dependence on renal dialysis: Secondary | ICD-10-CM

## 2017-05-13 LAB — BASIC METABOLIC PANEL
ANION GAP: 13 (ref 5–15)
BUN: 52 mg/dL — AB (ref 6–20)
CALCIUM: 7.6 mg/dL — AB (ref 8.9–10.3)
CO2: 22 mmol/L (ref 22–32)
Chloride: 99 mmol/L — ABNORMAL LOW (ref 101–111)
Creatinine, Ser: 9.66 mg/dL — ABNORMAL HIGH (ref 0.44–1.00)
GFR calc Af Amer: 6 mL/min — ABNORMAL LOW (ref >60)
GFR, EST NON AFRICAN AMERICAN: 5 mL/min — AB (ref >60)
GLUCOSE: 218 mg/dL — AB (ref 65–99)
Potassium: 4.8 mmol/L (ref 3.5–5.1)
Sodium: 134 mmol/L — ABNORMAL LOW (ref 135–145)

## 2017-05-13 LAB — CBC WITH DIFFERENTIAL/PLATELET
BASOS ABS: 0 10*3/uL (ref 0.0–0.1)
Basophils Relative: 0 %
EOS ABS: 0.1 10*3/uL (ref 0.0–0.7)
EOS PCT: 0 %
HCT: 38.7 % (ref 36.0–46.0)
Hemoglobin: 12.5 g/dL (ref 12.0–15.0)
LYMPHS PCT: 8 %
Lymphs Abs: 1 10*3/uL (ref 0.7–4.0)
MCH: 27.1 pg (ref 26.0–34.0)
MCHC: 32.3 g/dL (ref 30.0–36.0)
MCV: 83.8 fL (ref 78.0–100.0)
MONO ABS: 0.6 10*3/uL (ref 0.1–1.0)
Monocytes Relative: 5 %
Neutro Abs: 11.3 10*3/uL — ABNORMAL HIGH (ref 1.7–7.7)
Neutrophils Relative %: 87 %
PLATELETS: 163 10*3/uL (ref 150–400)
RBC: 4.62 MIL/uL (ref 3.87–5.11)
RDW: 18 % — AB (ref 11.5–15.5)
WBC: 13 10*3/uL — ABNORMAL HIGH (ref 4.0–10.5)

## 2017-05-13 LAB — I-STAT TROPONIN, ED: Troponin i, poc: 0.02 ng/mL (ref 0.00–0.08)

## 2017-05-13 MED ORDER — PIPERACILLIN-TAZOBACTAM 3.375 G IVPB 30 MIN
3.3750 g | Freq: Once | INTRAVENOUS | Status: AC
  Administered 2017-05-14: 3.375 g via INTRAVENOUS
  Filled 2017-05-13: qty 50

## 2017-05-13 MED ORDER — VANCOMYCIN HCL 10 G IV SOLR
1250.0000 mg | Freq: Once | INTRAVENOUS | Status: AC
  Administered 2017-05-14: 1250 mg via INTRAVENOUS
  Filled 2017-05-13: qty 1250

## 2017-05-13 MED ORDER — IOPAMIDOL (ISOVUE-370) INJECTION 76%
INTRAVENOUS | Status: AC
  Administered 2017-05-13: 100 mL
  Filled 2017-05-13: qty 100

## 2017-05-13 NOTE — ED Triage Notes (Signed)
The pt is coughing up bright red blood since 0830 am today  She is a dialysis pt that was dialyzed 2 days ago  Fistula lt upper arm   And she h also has a dialysis catheter rt upper chest  lmp last week  The blood is moxed with her sputum  Hx of the same

## 2017-05-13 NOTE — ED Provider Notes (Addendum)
[facility] EMERGENCY DEPARTMENT Provider Note   CSN: [id number] Arrival date & time: 05/13/17  1715     History   Chief Complaint Chief Complaint  Patient presents with  . Cough    HPI [name] is a 26 y.o. female.  26 yo F with a chief complaint of a cough.  She has had a chronic cough for about three months after she was found to be acutely fluid overloaded and started on dialysis.  This been going on for the past 3 months.  Her cough is gotten somewhat worse over the past day or so.  She is having some subjective fevers and chills and had a temperature at home of 100.  For the past 12 hours she has had some sputum that is been mixed with bright red blood.  She denies lower extremity edema.  Denies history of PE or DVT. Had some chest pain last night but resolved.    The history is provided by the patient.  Illness  This is a new problem. The current episode started more than 2 days ago. The problem occurs constantly. The problem has been gradually worsening. Associated symptoms include shortness of breath. Pertinent negatives include no chest pain, no abdominal pain and no headaches. Nothing aggravates the symptoms. Nothing relieves the symptoms. She has tried nothing for the symptoms. The treatment provided no relief.    Past Medical History:  Diagnosis Date  . Cardiac arrest ([place])   . Complication of anesthesia   . Diabetes mellitus type 1 ([place])    [facility]  . DKA (diabetic ketoacidoses) ([place])   . ESRD (end stage renal disease) ([place])   . Hypertension   . PONV (postoperative nausea and vomiting)     Patient Active Problem List   Diagnosis Date Noted  . Normocytic normochromic anemia 01/26/2017  . ESRD (end stage renal disease) ([place]) 01/26/2017  . Fluid overload 01/26/2017  . Depression 07/04/2014  . Hemodialysis-associated hypotension 04/24/2014  . Cerebral infarction due to thrombosis of right middle cerebral artery ([place])  04/24/2014  . Diabetes mellitus due to underlying condition without complications ([place]) 86/57/8469  . History of anemia 04/17/2014  . Cardiac arrest ([place]) 04/14/2014  . Diabetic neuropathy ([place]) 02/14/2014  . DKA, type 1 ([place]) 02/12/2014  . Tachycardia 02/12/2014  . DOE (dyspnea on exertion) 02/12/2014  . Abdominal pain 02/12/2014  . IDDM (insulin dependent diabetes mellitus) ([place]) 02/12/2014  . 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 Hypokalemia 12/07/2013  . Noncompliance 12/06/2013  . DKA (diabetic ketoacidoses) ([place]) 12/05/2013  . Leukocytosis 12/05/2013  . Acute on chronic renal failure ([place]) 12/05/2013  . Hypercalcemia 12/05/2013  . Abnormal LFTs 12/05/2013  . Elevated lipase 12/05/2013    Past Surgical History:  Procedure Laterality Date  . CATARACT EXTRACTION     6 years ago  . DIALYSIS FISTULA CREATION    . IR FLUORO GUIDE CV LINE RIGHT  01/26/2017  . IR US GUIDE VASC ACCESS RIGHT  01/26/2017    OB History    No data available       Home Medications    Prior to Admission medications   Medication Sig Start Date End Date Taking? Authorizing Provider  amLODipine (NORVASC) 2.5 MG tablet Take 2.5 mg by mouth daily.   Yes [provider]  calcium acetate (PHOSLO) 667 MG capsule Take 2 capsules (1,334 mg total) by mouth 3 (three) times daily with meals. Patient taking  differently: Take 2,001 mg by mouth 3 (three) times daily with meals.  01/29/17  Yes Georgette Shell, MD  insulin aspart (NOVOLOG) 100 UNIT/ML injection Inject 0-9 Units into the skin 3 (three) times daily with meals. 01/29/17  Yes Georgette Shell, MD  insulin glargine (LANTUS) 100 UNIT/ML injection Inject 0.25 mLs (25 Units total) into the skin at bedtime. Patient taking differently: Inject 14 Units into the skin at bedtime.  01/29/17  Yes Georgette Shell, MD  metoprolol succinate (TOPROL-XL) 25 MG 24 hr tablet Take 1 tablet (25 mg total) by  mouth daily. 01/29/17  Yes Georgette Shell, MD  multivitamin (RENA-VIT) TABS tablet Take 1 tablet by mouth at bedtime. 01/29/17  Yes Georgette Shell, MD  norethindrone (MICRONOR,CAMILA,ERRIN) 0.35 MG tablet Take 1 tablet (0.35 mg total) by mouth daily. 01/29/17  Yes Georgette Shell, MD    Family History Family History  Problem Relation Age of Onset  . Diabetes Maternal Grandmother   . Stroke Maternal Grandmother   . Cancer Maternal Aunt        aunt, passed last month, ?RCC  . Cancer Maternal Uncle     Social History Social History   Tobacco Use  . Smoking status: Never Smoker  . Smokeless tobacco: Never Used  Substance Use Topics  . Alcohol use: No    Alcohol/week: 0.0 oz  . Drug use: No     Allergies   Patient has no known allergies.   Review of Systems Review of Systems  Constitutional: Positive for fever. Negative for chills.  HENT: Negative for congestion and rhinorrhea.   Eyes: Negative for redness and visual disturbance.  Respiratory: Positive for cough and shortness of breath. Negative for wheezing.   Cardiovascular: Negative for chest pain and palpitations.  Gastrointestinal: Negative for abdominal pain, nausea and vomiting.  Genitourinary: Negative for dysuria and urgency.  Musculoskeletal: Negative for arthralgias and myalgias.  Skin: Negative for pallor and wound.  Neurological: Negative for dizziness and headaches.     Physical Exam Updated Vital Signs BP (!) 147/89   Pulse 100   Temp 99.7 F (37.6 C) (Oral)   Resp 15   Ht 5\' 1"  (1.549 m)   Wt 61.2 kg (135 lb)   LMP 05/06/2017   SpO2 95%   BMI 25.51 kg/m   Physical Exam  Constitutional: She is oriented to person, place, and time. She appears well-developed and well-nourished. No distress.  HENT:  Head: Normocephalic and atraumatic.  Eyes: EOM are normal. Pupils are equal, round, and reactive to light.  Neck: Normal range of motion. Neck supple.  Cardiovascular: Regular rhythm.  Tachycardia present. Exam reveals no gallop and no friction rub.  No murmur heard. Pulmonary/Chest: Effort normal. She has no wheezes. She has no rales.  Tunneled cath in place without erythema edema or drainage  Abdominal: Soft. She exhibits no distension and no mass. There is no tenderness. There is no guarding.  Musculoskeletal: She exhibits no edema or tenderness.  Neurological: She is alert and oriented to person, place, and time.  Skin: Skin is warm and dry. She is not diaphoretic.  Psychiatric: She has a normal mood and affect. Her behavior is normal.  Nursing note and vitals reviewed.    ED Treatments / Results  Labs (all labs ordered are listed, but only abnormal results are displayed) Labs Reviewed  CBC WITH DIFFERENTIAL/PLATELET - Abnormal; Notable for the following components:      Result Value   WBC 13.0 (*)  RDW 18.0 (*)    Neutro Abs 11.3 (*)    All other components within normal limits  BASIC METABOLIC PANEL - Abnormal; Notable for the following components:   Sodium 134 (*)    Chloride 99 (*)    Glucose, Bld 218 (*)    BUN 52 (*)    Creatinine, Ser 9.66 (*)    Calcium 7.6 (*)    GFR calc non Af Amer 5 (*)    GFR calc Af Amer 6 (*)    All other components within normal limits  CULTURE, BLOOD (ROUTINE X 2)  CULTURE, BLOOD (ROUTINE X 2)  I-STAT TROPONIN, ED  I-STAT CG4 LACTIC ACID, ED    EKG  EKG Interpretation  Date/Time:  Friday May 13 2017 23:46:11 EST Ventricular Rate:  98 PR Interval:    QRS Duration: 87 QT Interval:  371 QTC Calculation: 474 R Axis:   78 Text Interpretation:  Sinus rhythm Probable left atrial enlargement No significant change since last tracing Confirmed by Deno Etienne [phone]) on 05/13/2017 11:49:02 PM       Radiology Dg Chest 2 View  Result Date: 05/13/2017 CLINICAL DATA:  Hemoptysis beginning at 8:30 a.m. today. EXAM: CHEST  2 VIEW COMPARISON:  Single-view of the chest 01/26/2017. FINDINGS: Dialysis catheter remains  in place. Hazy bilateral airspace disease appears somewhat worse on the right. There is cardiomegaly. No pneumothorax or pleural effusion. IMPRESSION: Hazy bilateral airspace disease could be due to pneumonia, edema or pulmonary hemorrhage. Cardiomegaly. Electronically Signed   By: Inge Rise M.D.   On: 05/13/2017 18:12   Ct Angio Chest Pe W And/or Wo Contrast  Result Date: 05/13/2017 CLINICAL DATA:  Chest pain and dyspnea beginning yesterday. Hemoptysis this morning. EXAM: CT ANGIOGRAPHY CHEST WITH CONTRAST TECHNIQUE: Multidetector CT imaging of the chest was performed using the standard protocol during bolus administration of intravenous contrast. Multiplanar CT image reconstructions and MIPs were obtained to evaluate the vascular anatomy. CONTRAST:  121mL ISOVUE-370 IOPAMIDOL (ISOVUE-370) INJECTION 76% COMPARISON:  None. FINDINGS: Cardiovascular: Cardiomegaly without pericardial effusion. No aortic aneurysm. No acute central pulmonary embolus. Mediastinum/Nodes: Normal thyroid gland. Unremarkable appearance of the great vessels. Dialysis catheter is noted terminating in the proximal right atrium. No lymphadenopathy. Unremarkable esophagus. Lungs/Pleura: Multilobar consolidative airspace opacities throughout both lungs consistent with pneumonia with differential considerations may include stigmata of pulmonary edema or hemorrhage. Small bilateral pleural effusions right greater than left. Upper Abdomen: Normal Musculoskeletal: Diffusely sclerotic consistent with renal osteodystrophy. Review of the MIP images confirms the above findings. IMPRESSION: 1. Cardiomegaly with patchy multilobar consolidative airspace opacities and bilateral small pleural effusions right greater than left. Differential possibilities may include multilobar pneumonia with parapneumonic effusions versus stigmata of pulmonary edema or remotely pulmonary hemorrhage. 2. Osteosclerotic appearance of the bony thorax consistent with  renal osteodystrophy. Electronically Signed   By: Ashley Royalty M.D.   On: 05/13/2017 22:59    Procedures Procedures (including critical care time)  Medications Ordered in ED Medications  vancomycin (VANCOCIN) 1,250 mg in sodium chloride 0.9 % 250 mL IVPB (not administered)  piperacillin-tazobactam (ZOSYN) IVPB 3.375 g (not administered)  iopamidol (ISOVUE-370) 76 % injection (100 mLs  Contrast Given 05/13/17 2225)     Initial Impression / Assessment and Plan / ED Course  I have reviewed the triage vital signs and the nursing notes.  Pertinent labs & imaging results that were available during my care of the patient were reviewed by me and considered in my medical decision making (see chart for  details).     26 yo F with a cc of hemoptysis.  Going on for the past 12 hours.  Is mildly better but the patient is still short of breath.  She has been having some low-grade fevers at home as well.  She had a chest x-ray done in triage that the radiologist was concerned for the possibility of pulmonary hemorrhage.  I discussed the case with the radiologist as well as Dr. Moshe Cipro, nephrology.  They felt it was okay to go ahead with IV contrast to rule out pulmonary hemorrhage or PE.  On CT the radiologist feels the patient is most likely to have multifocal pneumonia with likely a component of fluid overload and less likely to have hemorrhage.  With the patient having fevers at home and cough I suspect that this is infectious.  The patient  is hypoxic in the room at rest and then upon exertion goes into the low 80s.  Will start on broad-spectrum antibiotics.  Discuss with hospitalist for admission.  The patients results and plan were reviewed and discussed.   Any x-rays performed were independently reviewed by myself.   Differential diagnosis were considered with the presenting HPI.  Medications  vancomycin (VANCOCIN) 1,250 mg in sodium chloride 0.9 % 250 mL IVPB (not administered)    piperacillin-tazobactam (ZOSYN) IVPB 3.375 g (not administered)  iopamidol (ISOVUE-370) 76 % injection (100 mLs  Contrast Given 05/13/17 2225)    Vitals:   05/13/17 2145 05/13/17 2200 05/13/17 2302 05/13/17 2303  BP: (!) 148/88 (!) 147/89    Pulse: 98 100    Resp:  15    Temp:      TempSrc:      SpO2: 92% 90% (!) 83% 95%  Weight:      Height:        Final diagnoses:  HCAP (healthcare-associated pneumonia)    Admission/ observation were discussed with the admitting physician, patient and/or family and they are comfortable with the plan.    Final Clinical Impressions(s) / ED Diagnoses   Final diagnoses:  HCAP (healthcare-associated pneumonia)    ED Discharge Orders    None           Deno Etienne, DO 05/14/17 0004

## 2017-05-13 NOTE — ED Notes (Signed)
Highest O2 while ambulating was 91%, dropped down to 83%

## 2017-05-14 ENCOUNTER — Encounter (HOSPITAL_COMMUNITY): Payer: Self-pay | Admitting: Internal Medicine

## 2017-05-14 DIAGNOSIS — R0902 Hypoxemia: Secondary | ICD-10-CM | POA: Diagnosis present

## 2017-05-14 DIAGNOSIS — E1029 Type 1 diabetes mellitus with other diabetic kidney complication: Secondary | ICD-10-CM | POA: Diagnosis not present

## 2017-05-14 DIAGNOSIS — I1311 Hypertensive heart and chronic kidney disease without heart failure, with stage 5 chronic kidney disease, or end stage renal disease: Secondary | ICD-10-CM | POA: Diagnosis present

## 2017-05-14 DIAGNOSIS — Z8674 Personal history of sudden cardiac arrest: Secondary | ICD-10-CM | POA: Diagnosis not present

## 2017-05-14 DIAGNOSIS — Z794 Long term (current) use of insulin: Secondary | ICD-10-CM | POA: Diagnosis not present

## 2017-05-14 DIAGNOSIS — N186 End stage renal disease: Secondary | ICD-10-CM | POA: Diagnosis present

## 2017-05-14 DIAGNOSIS — E1065 Type 1 diabetes mellitus with hyperglycemia: Secondary | ICD-10-CM | POA: Diagnosis present

## 2017-05-14 DIAGNOSIS — J181 Lobar pneumonia, unspecified organism: Secondary | ICD-10-CM | POA: Diagnosis present

## 2017-05-14 DIAGNOSIS — D631 Anemia in chronic kidney disease: Secondary | ICD-10-CM | POA: Diagnosis present

## 2017-05-14 DIAGNOSIS — Z992 Dependence on renal dialysis: Secondary | ICD-10-CM | POA: Diagnosis not present

## 2017-05-14 DIAGNOSIS — E1010 Type 1 diabetes mellitus with ketoacidosis without coma: Secondary | ICD-10-CM | POA: Diagnosis not present

## 2017-05-14 DIAGNOSIS — R042 Hemoptysis: Secondary | ICD-10-CM | POA: Diagnosis present

## 2017-05-14 DIAGNOSIS — R000 Tachycardia, unspecified: Secondary | ICD-10-CM | POA: Diagnosis present

## 2017-05-14 DIAGNOSIS — J189 Pneumonia, unspecified organism: Secondary | ICD-10-CM | POA: Diagnosis present

## 2017-05-14 DIAGNOSIS — I517 Cardiomegaly: Secondary | ICD-10-CM | POA: Diagnosis present

## 2017-05-14 DIAGNOSIS — E8770 Fluid overload, unspecified: Secondary | ICD-10-CM | POA: Diagnosis present

## 2017-05-14 DIAGNOSIS — L7602 Intraoperative hemorrhage and hematoma of skin and subcutaneous tissue complicating other procedure: Secondary | ICD-10-CM | POA: Diagnosis present

## 2017-05-14 DIAGNOSIS — Z5309 Procedure and treatment not carried out because of other contraindication: Secondary | ICD-10-CM | POA: Diagnosis present

## 2017-05-14 DIAGNOSIS — I10 Essential (primary) hypertension: Secondary | ICD-10-CM | POA: Diagnosis not present

## 2017-05-14 DIAGNOSIS — Z1624 Resistance to multiple antibiotics: Secondary | ICD-10-CM | POA: Diagnosis present

## 2017-05-14 DIAGNOSIS — N2581 Secondary hyperparathyroidism of renal origin: Secondary | ICD-10-CM | POA: Diagnosis present

## 2017-05-14 DIAGNOSIS — E1022 Type 1 diabetes mellitus with diabetic chronic kidney disease: Secondary | ICD-10-CM | POA: Diagnosis present

## 2017-05-14 DIAGNOSIS — Y838 Other surgical procedures as the cause of abnormal reaction of the patient, or of later complication, without mention of misadventure at the time of the procedure: Secondary | ICD-10-CM | POA: Diagnosis present

## 2017-05-14 DIAGNOSIS — Y92238 Other place in hospital as the place of occurrence of the external cause: Secondary | ICD-10-CM | POA: Diagnosis present

## 2017-05-14 DIAGNOSIS — Y95 Nosocomial condition: Secondary | ICD-10-CM | POA: Diagnosis present

## 2017-05-14 LAB — CBC WITH DIFFERENTIAL/PLATELET
BASOS ABS: 0 10*3/uL (ref 0.0–0.1)
BASOS PCT: 0 %
Eosinophils Absolute: 0.1 10*3/uL (ref 0.0–0.7)
Eosinophils Relative: 1 %
HEMATOCRIT: 38 % (ref 36.0–46.0)
HEMOGLOBIN: 12.2 g/dL (ref 12.0–15.0)
LYMPHS PCT: 11 %
Lymphs Abs: 1.2 10*3/uL (ref 0.7–4.0)
MCH: 26.9 pg (ref 26.0–34.0)
MCHC: 32.1 g/dL (ref 30.0–36.0)
MCV: 83.9 fL (ref 78.0–100.0)
MONO ABS: 0.5 10*3/uL (ref 0.1–1.0)
MONOS PCT: 5 %
NEUTROS ABS: 9.3 10*3/uL — AB (ref 1.7–7.7)
NEUTROS PCT: 83 %
Platelets: 151 10*3/uL (ref 150–400)
RBC: 4.53 MIL/uL (ref 3.87–5.11)
RDW: 18.3 % — AB (ref 11.5–15.5)
WBC: 11.1 10*3/uL — ABNORMAL HIGH (ref 4.0–10.5)

## 2017-05-14 LAB — PROCALCITONIN: Procalcitonin: 0.26 ng/mL

## 2017-05-14 LAB — BASIC METABOLIC PANEL
ANION GAP: 13 (ref 5–15)
BUN: 54 mg/dL — ABNORMAL HIGH (ref 6–20)
CO2: 19 mmol/L — AB (ref 22–32)
Calcium: 7.2 mg/dL — ABNORMAL LOW (ref 8.9–10.3)
Chloride: 100 mmol/L — ABNORMAL LOW (ref 101–111)
Creatinine, Ser: 9.71 mg/dL — ABNORMAL HIGH (ref 0.44–1.00)
GFR calc non Af Amer: 5 mL/min — ABNORMAL LOW (ref >60)
GFR, EST AFRICAN AMERICAN: 6 mL/min — AB (ref >60)
GLUCOSE: 171 mg/dL — AB (ref 65–99)
POTASSIUM: 4.3 mmol/L (ref 3.5–5.1)
Sodium: 132 mmol/L — ABNORMAL LOW (ref 135–145)

## 2017-05-14 LAB — HIV ANTIBODY (ROUTINE TESTING W REFLEX): HIV Screen 4th Generation wRfx: NONREACTIVE

## 2017-05-14 LAB — GLUCOSE, CAPILLARY
Glucose-Capillary: 135 mg/dL — ABNORMAL HIGH (ref 65–99)
Glucose-Capillary: 90 mg/dL (ref 65–99)
Glucose-Capillary: 465 mg/dL — ABNORMAL HIGH (ref 65–99)

## 2017-05-14 LAB — I-STAT CG4 LACTIC ACID, ED: LACTIC ACID, VENOUS: 1.33 mmol/L (ref 0.5–1.9)

## 2017-05-14 LAB — MRSA PCR SCREENING: MRSA BY PCR: NEGATIVE

## 2017-05-14 MED ORDER — ACETAMINOPHEN 650 MG RE SUPP: 650.0000 mg | Freq: Four times a day (QID) | RECTAL | Status: DC | PRN

## 2017-05-14 MED ORDER — HYDROXYZINE HCL 25 MG PO TABS: 25.0000 mg | ORAL_TABLET | Freq: Three times a day (TID) | ORAL | Status: DC | PRN

## 2017-05-14 MED ORDER — CAMPHOR-MENTHOL 0.5-0.5 % EX LOTN
1.0000 "application " | TOPICAL_LOTION | Freq: Three times a day (TID) | CUTANEOUS | Status: DC | PRN
  Filled 2017-05-14: qty 222

## 2017-05-14 MED ORDER — INSULIN ASPART 100 UNIT/ML ~~LOC~~ SOLN
0.0000 [IU] | Freq: Three times a day (TID) | SUBCUTANEOUS | Status: DC
Start: 2017-05-14 — End: 2017-05-16
  Administered 2017-05-14: 2 [IU] via SUBCUTANEOUS
  Administered 2017-05-15 – 2017-05-16 (×3): 15 [IU] via SUBCUTANEOUS
  Administered 2017-05-16: 2 [IU] via SUBCUTANEOUS

## 2017-05-14 MED ORDER — METOPROLOL SUCCINATE ER 25 MG PO TB24
25.0000 mg | ORAL_TABLET | Freq: Every day | ORAL | Status: DC
  Administered 2017-05-14 – 2017-05-16 (×3): 25 mg via ORAL
  Filled 2017-05-14 (×4): qty 1

## 2017-05-14 MED ORDER — HEPARIN SODIUM (PORCINE) 1000 UNIT/ML DIALYSIS: 1000.0000 [IU] | INTRAMUSCULAR | Status: DC | PRN

## 2017-05-14 MED ORDER — ACETAMINOPHEN 325 MG PO TABS
ORAL_TABLET | ORAL | Status: AC
  Filled 2017-05-14: qty 2

## 2017-05-14 MED ORDER — DOXERCALCIFEROL 4 MCG/2ML IV SOLN
INTRAVENOUS | Status: AC
  Administered 2017-05-14: 4 ug via INTRAVENOUS
  Filled 2017-05-14: qty 2

## 2017-05-14 MED ORDER — LIDOCAINE HCL (PF) 1 % IJ SOLN: 5.0000 mL | INTRAMUSCULAR | Status: DC | PRN

## 2017-05-14 MED ORDER — DOXERCALCIFEROL 4 MCG/2ML IV SOLN
4.0000 ug | INTRAVENOUS | Status: DC
  Administered 2017-05-14: 4 ug via INTRAVENOUS
  Filled 2017-05-14: qty 2

## 2017-05-14 MED ORDER — NEPRO/CARBSTEADY PO LIQD
237.0000 mL | Freq: Three times a day (TID) | ORAL | Status: DC | PRN
  Filled 2017-05-14: qty 237

## 2017-05-14 MED ORDER — AMLODIPINE BESYLATE 5 MG PO TABS: 2.5000 mg | ORAL_TABLET | Freq: Every day | ORAL | Status: DC

## 2017-05-14 MED ORDER — NORETHINDRONE 0.35 MG PO TABS: 1.0000 | ORAL_TABLET | Freq: Every day | ORAL | Status: DC

## 2017-05-14 MED ORDER — INSULIN ASPART 100 UNIT/ML ~~LOC~~ SOLN
3.0000 [IU] | Freq: Three times a day (TID) | SUBCUTANEOUS | Status: DC
  Administered 2017-05-14 – 2017-05-16 (×4): 3 [IU] via SUBCUTANEOUS

## 2017-05-14 MED ORDER — SODIUM CHLORIDE 0.9 % IV SOLN: 100.0000 mL | INTRAVENOUS | Status: DC | PRN

## 2017-05-14 MED ORDER — AMLODIPINE BESYLATE 5 MG PO TABS: 7.5000 mg | ORAL_TABLET | Freq: Every day | ORAL | Status: DC

## 2017-05-14 MED ORDER — RENA-VITE PO TABS
1.0000 | ORAL_TABLET | Freq: Every day | ORAL | Status: DC
  Administered 2017-05-14 – 2017-05-15 (×3): 1 via ORAL
  Filled 2017-05-14 (×3): qty 1

## 2017-05-14 MED ORDER — PENTAFLUOROPROP-TETRAFLUOROETH EX AERO: 1.0000 "application " | INHALATION_SPRAY | CUTANEOUS | Status: DC | PRN

## 2017-05-14 MED ORDER — INSULIN ASPART 100 UNIT/ML ~~LOC~~ SOLN
9.0000 [IU] | Freq: Once | SUBCUTANEOUS | Status: AC
  Administered 2017-05-14: 9 [IU] via SUBCUTANEOUS

## 2017-05-14 MED ORDER — DOCUSATE SODIUM 283 MG RE ENEM
1.0000 | ENEMA | RECTAL | Status: DC | PRN
  Filled 2017-05-14: qty 1

## 2017-05-14 MED ORDER — AMLODIPINE BESYLATE 5 MG PO TABS
7.5000 mg | ORAL_TABLET | Freq: Every day | ORAL | Status: DC
  Administered 2017-05-14 – 2017-05-16 (×3): 7.5 mg via ORAL
  Filled 2017-05-14 (×2): qty 2
  Filled 2017-05-14: qty 1
  Filled 2017-05-14: qty 2

## 2017-05-14 MED ORDER — PIPERACILLIN-TAZOBACTAM 3.375 G IVPB
3.3750 g | Freq: Two times a day (BID) | INTRAVENOUS | Status: DC
  Administered 2017-05-14 – 2017-05-15 (×4): 3.375 g via INTRAVENOUS
  Filled 2017-05-14 (×6): qty 50

## 2017-05-14 MED ORDER — VANCOMYCIN HCL IN DEXTROSE 750-5 MG/150ML-% IV SOLN
INTRAVENOUS | Status: AC
  Administered 2017-05-14: 750 mg via INTRAVENOUS
  Filled 2017-05-14: qty 150

## 2017-05-14 MED ORDER — INSULIN ASPART 100 UNIT/ML ~~LOC~~ SOLN
0.0000 [IU] | Freq: Every day | SUBCUTANEOUS | Status: DC
  Administered 2017-05-15: 4 [IU] via SUBCUTANEOUS

## 2017-05-14 MED ORDER — ENOXAPARIN SODIUM 30 MG/0.3ML ~~LOC~~ SOLN
30.0000 mg | SUBCUTANEOUS | Status: DC
  Administered 2017-05-15 – 2017-05-16 (×2): 30 mg via SUBCUTANEOUS
  Filled 2017-05-14 (×3): qty 0.3

## 2017-05-14 MED ORDER — SORBITOL 70 % SOLN: 30.0000 mL | Status: DC | PRN

## 2017-05-14 MED ORDER — ALTEPLASE 2 MG IJ SOLR: 2.0000 mg | Freq: Once | INTRAMUSCULAR | Status: DC | PRN

## 2017-05-14 MED ORDER — ONDANSETRON HCL 4 MG/2ML IJ SOLN
INTRAMUSCULAR | Status: AC
  Filled 2017-05-14: qty 2

## 2017-05-14 MED ORDER — LIDOCAINE-PRILOCAINE 2.5-2.5 % EX CREA: 1.0000 "application " | TOPICAL_CREAM | CUTANEOUS | Status: DC | PRN

## 2017-05-14 MED ORDER — NORETHINDRONE 0.35 MG PO TABS
1.0000 | ORAL_TABLET | Freq: Every day | ORAL | Status: DC
  Administered 2017-05-14 – 2017-05-16 (×3): 0.35 mg via ORAL

## 2017-05-14 MED ORDER — INSULIN GLARGINE 100 UNIT/ML ~~LOC~~ SOLN
14.0000 [IU] | Freq: Every day | SUBCUTANEOUS | Status: DC
  Administered 2017-05-14: 14 [IU] via SUBCUTANEOUS
  Filled 2017-05-14 (×2): qty 0.14

## 2017-05-14 MED ORDER — ZOLPIDEM TARTRATE 5 MG PO TABS
5.0000 mg | ORAL_TABLET | Freq: Every evening | ORAL | Status: DC | PRN
  Administered 2017-05-14 – 2017-05-15 (×2): 5 mg via ORAL
  Filled 2017-05-14 (×2): qty 1

## 2017-05-14 MED ORDER — ONDANSETRON HCL 4 MG/2ML IJ SOLN
4.0000 mg | Freq: Four times a day (QID) | INTRAMUSCULAR | Status: DC | PRN
  Administered 2017-05-14: 4 mg via INTRAVENOUS

## 2017-05-14 MED ORDER — ACETAMINOPHEN 325 MG PO TABS
650.0000 mg | ORAL_TABLET | Freq: Four times a day (QID) | ORAL | Status: DC | PRN
  Administered 2017-05-14 (×2): 650 mg via ORAL
  Filled 2017-05-14: qty 2

## 2017-05-14 MED ORDER — VANCOMYCIN HCL IN DEXTROSE 750-5 MG/150ML-% IV SOLN
750.0000 mg | INTRAVENOUS | Status: DC
  Administered 2017-05-14: 750 mg via INTRAVENOUS
  Filled 2017-05-14: qty 150

## 2017-05-14 MED ORDER — CALCIUM ACETATE (PHOS BINDER) 667 MG PO CAPS
2001.0000 mg | ORAL_CAPSULE | Freq: Three times a day (TID) | ORAL | Status: DC
  Administered 2017-05-14 – 2017-05-16 (×6): 2001 mg via ORAL
  Filled 2017-05-14 (×6): qty 3

## 2017-05-14 MED ORDER — CALCIUM CARBONATE ANTACID 1250 MG/5ML PO SUSP
500.0000 mg | Freq: Four times a day (QID) | ORAL | Status: DC | PRN
  Filled 2017-05-14: qty 5

## 2017-05-14 MED ORDER — ONDANSETRON HCL 4 MG PO TABS: 4.0000 mg | ORAL_TABLET | Freq: Four times a day (QID) | ORAL | Status: DC | PRN

## 2017-05-14 NOTE — ED Notes (Signed)
Admitting MD asked to have [place] wait to collect second set of blood cultures and start abx.  Putting additional labs in

## 2017-05-14 NOTE — ED Notes (Signed)
Patient is stable and ready to be transport to the floor at this time.  Report was called to 61M RN.  Belongings taken with the patient to the floor.

## 2017-05-14 NOTE — Progress Notes (Signed)
[facility]   Patient admitted after midnight see H&P for further details   Patient seen, chart reviewed.   26 y/o F with medical hx of DM type 1 poorly controlled , ESRD on HD and HTN presented to the ED c/o hemoptysis, Upon ED evaluation found to have multifocal PNA. Patient was admitted for further treatment.   No complaints this am, afebrile, going fo dialysis this afternoon.   Continue current management   Chipper Oman, MD

## 2017-05-14 NOTE — ED Notes (Signed)
Attempted report x1. 

## 2017-05-14 NOTE — Consult Note (Signed)
[facility] Renal Consultation Note    Indication for Consultation:  Management of ESRD/hemodialysis, anemia, hypertension/volume, and secondary hyperparathyroidism. PCP:  HPI: [name] is a 26 y.o. female  with ESRD, Type 1 DM, HTN who was admitted with pneumonia and hemoptysis.  She reports that she started coughing heavily and coughing up blood yesterday morning, persisted all day. She presented to the ED, found to be slightly hypoxic with low grade fever. BP was reasonably controlled. CXR hazy B airspace disease, then underwent CT angio which showed multi-lobar air disease, pneumonia v. pulmonary edema v. remote pulmonary hemorrhage. Labs showed WBC 11.1, Hgb 12.2, K 4.3, pro-calcitonin 0.26. Blood Cultures were drawn and she was started on Vanc/Zosyn.  From a renal standpoint, she usually dialyzes TTS at [facility]. Last dialysis was 11/21 (Wednesday, per holiday schedule). She did complete her whole HD but did not reach her EDW on that day (EDW 58.5, left at 60.1kg). She has a AVF which is being used without issues, scheduled for [facility] removal but this has not been done yet.  Past Medical History:  Diagnosis Date  . Cardiac arrest ([place])   . Complication of anesthesia   . Diabetes mellitus type 1 ([place])    [facility] dx [facility]  . DKA (diabetic ketoacidoses) ([place])   . ESRD (end stage renal disease) (HCC)    TTS HD, dialyzed at [facility]  . Hypertension   . PONV (postoperative nausea and vomiting)    Past Surgical History:  Procedure Laterality Date  . CATARACT EXTRACTION     6 years ago  . DIALYSIS FISTULA CREATION    . IR FLUORO GUIDE CV LINE RIGHT  01/26/2017  . IR US GUIDE VASC ACCESS RIGHT  01/26/2017   Family History  Problem Relation Age of Onset  . Diabetes Maternal Grandmother   . Stroke Maternal Grandmother   . Cancer Maternal Aunt        aunt, passed last month, ?RCC  . Cancer Maternal Uncle    Social History:  reports that  has never smoked.  she has never used smokeless tobacco. She reports that she does not drink alcohol or use drugs.  ROS: As per HPI otherwise negative.  Physical Exam: Vitals:   05/13/17 2303 05/14/17 0211 05/14/17 0503 05/14/17 0900  BP:  (!) 155/88 133/78 139/78  Pulse:  98 92 91  Resp:  18 18 18   Temp:  99.7 F (37.6 C) 99.4 F (37.4 C) 99.2 F (37.3 C)  TempSrc:  Oral Oral Oral  SpO2: 95% 94% 95% 96%  Weight:      Height:         General: Well appearing female, NAD. Wearing nasal oxygen. Head: Normocephalic, atraumatic, sclera non-icteric, mucus membranes are moist. Neck: Supple without lymphadenopathy/masses. JVD not elevated. Lungs: Coarse air movement, ? rales in LLL. No wheezing. Poor effort Heart: Slightly tachycardic, regular rhythm without murmur Abdomen: Soft, non-tender, non-distended with normoactive bowel sounds. No rebound/guarding. Musculoskeletal:  Strength and tone appear normal for age. Lower extremities: No edema or ischemic changes, no open wounds. Neuro: Alert and oriented X 3. Moves all extremities spontaneously. Psych:  Responds to questions appropriately with a normal affect. Dialysis Access: LUE AVF + thrill, also with R TDC still in place.  No Known Allergies Prior to Admission medications   Medication Sig Start Date End Date Taking? Authorizing Provider  amLODipine (NORVASC) 2.5 MG tablet Take 2.5 mg by mouth daily.   Yes [provider]  calcium  acetate (PHOSLO) 667 MG capsule Take 2 capsules (1,334 mg total) by mouth 3 (three) times daily with meals. Patient taking differently: Take 2,001 mg by mouth 3 (three) times daily with meals.  01/29/17  Yes Georgette Shell, MD  insulin aspart (NOVOLOG) 100 UNIT/ML injection Inject 0-9 Units into the skin 3 (three) times daily with meals. 01/29/17  Yes Georgette Shell, MD  insulin glargine (LANTUS) 100 UNIT/ML injection Inject 0.25 mLs (25 Units total) into the skin at bedtime. Patient taking differently:  Inject 14 Units into the skin at bedtime.  01/29/17  Yes Georgette Shell, MD  metoprolol succinate (TOPROL-XL) 25 MG 24 hr tablet Take 1 tablet (25 mg total) by mouth daily. 01/29/17  Yes Georgette Shell, MD  multivitamin (RENA-VIT) TABS tablet Take 1 tablet by mouth at bedtime. 01/29/17  Yes Georgette Shell, MD  norethindrone (MICRONOR,CAMILA,ERRIN) 0.35 MG tablet Take 1 tablet (0.35 mg total) by mouth daily. 01/29/17  Yes Georgette Shell, MD   Current Facility-Administered Medications  Medication Dose Route Frequency Provider Last Rate Last Dose  . acetaminophen (TYLENOL) tablet 650 mg  650 mg Oral Q6H PRN Karmen Bongo, MD   650 mg at 05/14/17 0215   Or  . acetaminophen (TYLENOL) suppository 650 mg  650 mg Rectal Q6H PRN Karmen Bongo, MD      . amLODipine (NORVASC) tablet 2.5 mg  2.5 mg Oral Daily Karmen Bongo, MD      . calcium acetate (PHOSLO) capsule 2,001 mg  2,001 mg Oral TID WC Karmen Bongo, MD   2,001 mg at 05/14/17 0847  . calcium carbonate (dosed in mg elemental calcium) suspension 500 mg of elemental calcium  500 mg of elemental calcium Oral Q6H PRN Karmen Bongo, MD      . camphor-menthol [facility]) lotion 1 application  1 application Topical C6C PRN Karmen Bongo, MD       And  . hydrOXYzine (ATARAX/VISTARIL) tablet 25 mg  25 mg Oral Q8H PRN Karmen Bongo, MD      . docusate sodium [facility]) enema 283 mg  1 enema Rectal PRN Karmen Bongo, MD      . enoxaparin (LOVENOX) injection 30 mg  30 mg Subcutaneous Q24H Karmen Bongo, MD      . feeding supplement (NEPRO CARB STEADY) liquid 237 mL  237 mL Oral TID PRN Karmen Bongo, MD      . insulin aspart (novoLOG) injection 0-15 Units  0-15 Units Subcutaneous TID WC Karmen Bongo, MD   2 Units at 05/14/17 [phone]  . insulin aspart (novoLOG) injection 0-5 Units  0-5 Units Subcutaneous QHS Karmen Bongo, MD      . insulin aspart (novoLOG) injection 3 Units  3 Units Subcutaneous TID WC Karmen Bongo, MD    3 Units at 05/14/17 0848  . insulin glargine (LANTUS) injection 14 Units  14 Units Subcutaneous QHS Karmen Bongo, MD      . metoprolol succinate (TOPROL-XL) 24 hr tablet 25 mg  25 mg Oral Daily Karmen Bongo, MD      . multivitamin (RENA-VIT) tablet 1 tablet  1 tablet Oral Ivery Quale, MD   1 tablet at 05/14/17 0212  . norethindrone (MICRONOR,CAMILA,ERRIN) 0.35 MG tablet 0.35 mg  1 tablet Oral Daily Karmen Bongo, MD      . ondansetron [facility]) tablet 4 mg  4 mg Oral Q6H PRN Karmen Bongo, MD       Or  . ondansetron [facility]) injection 4 mg  4 mg Intravenous Q6H  PRN Karmen Bongo, MD      . piperacillin-tazobactam (ZOSYN) IVPB 3.375 g  3.375 g Intravenous Q12H Erenest Blank, RPH      . sorbitol 70 % solution 30 mL  30 mL Oral PRN Karmen Bongo, MD      . vancomycin (VANCOCIN) IVPB 750 mg/150 ml premix  750 mg Intravenous Q T,Th,Sa-HD Erenest Blank, RPH      . zolpidem (AMBIEN) tablet 5 mg  5 mg Oral QHS PRN Karmen Bongo, MD       Labs: Basic Metabolic Panel: Recent Labs  Lab 05/13/17 2153 05/14/17 0058  NA 134* 132*  K 4.8 4.3  CL 99* 100*  CO2 22 19*  GLUCOSE 218* 171*  BUN 52* 54*  CREATININE 9.66* 9.71*  CALCIUM 7.6* 7.2*   CBC: Recent Labs  Lab 05/13/17 2153 05/14/17 0058  WBC 13.0* 11.1*  NEUTROABS 11.3* 9.3*  HGB 12.5 12.2  HCT 38.7 38.0  MCV 83.8 83.9  PLT 163 151   CBG: Recent Labs  Lab 05/14/17 0729  GLUCAP 135*   Studies/Results: Dg Chest 2 View  Result Date: 05/13/2017 CLINICAL DATA:  Hemoptysis beginning at 8:30 a.m. today. EXAM: CHEST  2 VIEW COMPARISON:  Single-view of the chest 01/26/2017. FINDINGS: Dialysis catheter remains in place. Hazy bilateral airspace disease appears somewhat worse on the right. There is cardiomegaly. No pneumothorax or pleural effusion. IMPRESSION: Hazy bilateral airspace disease could be due to pneumonia, edema or pulmonary hemorrhage. Cardiomegaly. Electronically Signed   By: Inge Rise  M.D.   On: 05/13/2017 18:12   Ct Angio Chest Pe W And/or Wo Contrast  Result Date: 05/13/2017 CLINICAL DATA:  Chest pain and dyspnea beginning yesterday. Hemoptysis this morning. EXAM: CT ANGIOGRAPHY CHEST WITH CONTRAST TECHNIQUE: Multidetector CT imaging of the chest was performed using the standard protocol during bolus administration of intravenous contrast. Multiplanar CT image reconstructions and MIPs were obtained to evaluate the vascular anatomy. CONTRAST:  165mL ISOVUE-370 IOPAMIDOL (ISOVUE-370) INJECTION 76% COMPARISON:  None. FINDINGS: Cardiovascular: Cardiomegaly without pericardial effusion. No aortic aneurysm. No acute central pulmonary embolus. Mediastinum/Nodes: Normal thyroid gland. Unremarkable appearance of the great vessels. Dialysis catheter is noted terminating in the proximal right atrium. No lymphadenopathy. Unremarkable esophagus. Lungs/Pleura: Multilobar consolidative airspace opacities throughout both lungs consistent with pneumonia with differential considerations may include stigmata of pulmonary edema or hemorrhage. Small bilateral pleural effusions right greater than left. Upper Abdomen: Normal Musculoskeletal: Diffusely sclerotic consistent with renal osteodystrophy. Review of the MIP images confirms the above findings. IMPRESSION: 1. Cardiomegaly with patchy multilobar consolidative airspace opacities and bilateral small pleural effusions right greater than left. Differential possibilities may include multilobar pneumonia with parapneumonic effusions versus stigmata of pulmonary edema or remotely pulmonary hemorrhage. 2. Osteosclerotic appearance of the bony thorax consistent with renal osteodystrophy. Electronically Signed   By: Ashley Royalty M.D.   On: 05/13/2017 22:59   Dialysis Orders:  TTS at [place], BFR 400, DFR 800, EDW 58.5kg, 2K/2.25Ca bath, TDC and AVF, heparin 1800 bolus - Hectoral 52mcg IV q HD - Venofer 50mg  IV weekly  Assessment/Plan: 1.   Pneumonia (w/ hemoptysis): BCx pending, started on Vanc/Zosyn.  2.  ESRD: Due for HD today, per TTS schedule via AVF. No heparin, 2K bath. Will try to get PC out while here  3.  Hypertension/volume: BP controlled, will try to get her down to her EDW and challenge further if tolerated given appearance of imaging. 4.  Anemia: Hgb 12.2. No ESA for now.  5.  Metabolic bone disease: Ca slightly low, Phos pending. Continue Phoslo/Hectoral for now. 6.  Type 1 DM: on insulin, per primary.  Veneta Penton, PA-C 05/14/2017, 9:57 AM  [facility] Pager: [phone]  Patient seen and examined, agree with above note with above modifications. Fairly new start to HD- for 3 mos- came in with hemoptysis- CXR shows PNA vs fluid-suspect both-  hypoxic.  Is on abx and will do HD with aggressive UF today.  Using AVF but still with PC- will see if can get PC out while here  Corliss Parish, MD 05/14/2017

## 2017-05-14 NOTE — ED Notes (Signed)
Admitting MD will put in more labs....[name] in 2nd blood culture.

## 2017-05-14 NOTE — Procedures (Signed)
I have personally attended this patient's dialysis session.   Clotted dialyzer (no heparin 2/2 question raised of pulm hemorrhage) - restringing EDW 58.5 kg Weight 61.2 kg 2K bath K 4.3  Jamal Maes, MD [facility] [phone] Pager 05/14/2017, 3:16 PM

## 2017-05-14 NOTE — H&P (Signed)
History and Physical    JENNET SCROGGIN [name]:[id number] DOB: Sep 07, 1990 DOA: 05/13/2017  PCP: [facility] Consultants:  [facility]; Charlene Brooke - eye; Leonides Schanz - endocrinologist Patient coming from:  Home - lives alone; NOK: Mother, [phone]  Chief Complaint: hemoptysis  HPI: [name] is a 26 y.o. female with medical history significant of HTN and poorly controlled type 1 DM leading to ESRD presenting with hemoptysis.  Patient has been coughing up blood all day.  She started feeling bad last night with pain in her chest and right arm.  +cough yesterday without hemoptysis, nonproductive.  +fever to 99.9.  Sugars have been all over the place - as low as 86 last week and up to unreadable on Sunday.  No sick contacts.  She does still make some urine.   ED Course:  Hemoptysis, SOB.  Found to be hypoxic.  Dr. Moshe Cipro approved CTA, which showed multifocal PNA.  May have some volume overload as well.  Treated with Vanc/Zosyn.  Review of Systems: As per HPI; otherwise review of systems reviewed and negative.   Ambulatory Status:  Ambulates without assistance  Past Medical History:  Diagnosis Date  . Cardiac arrest ([place])   . Complication of anesthesia   . Diabetes mellitus type 1 ([place])    [facility] dx [facility]  . DKA (diabetic ketoacidoses) ([place])   . ESRD (end stage renal disease) (HCC)    TTS HD, dialyzed at [facility]  . Hypertension   . PONV (postoperative nausea and vomiting)     Past Surgical History:  Procedure Laterality Date  . CATARACT EXTRACTION     6 years ago  . DIALYSIS FISTULA CREATION    . IR FLUORO GUIDE CV LINE RIGHT  01/26/2017  . IR US GUIDE VASC ACCESS RIGHT  01/26/2017    Social History   Socioeconomic History  . Marital status: Single    Spouse name: Not on file  . Number of children: 0  . Years of education: 42  . Highest education level: Not on file  Social Needs  . Financial resource strain: Not on file  . Food  insecurity - worry: Not on file  . Food insecurity - inability: Not on file  . Transportation needs - medical: Not on file  . Transportation needs - non-medical: Not on file  Occupational History  . Occupation: Ship broker at [employer]    Comment: business administration  Tobacco Use  . Smoking status: Never Smoker  . Smokeless tobacco: Never Used  Substance and Sexual Activity  . Alcohol use: No    Alcohol/week: 0.0 oz  . Drug use: No  . Sexual activity: Yes    Partners: Male    Birth control/protection: Condom    Comment: last encounter 4 months ago  Other Topics Concern  . Not on file  Social History Narrative   Patient is single with no children.   Patient is right handed.   Patient has hs education.   Patient does not drink caffeine.    No Known Allergies  Family History  Problem Relation Age of Onset  . Diabetes Maternal Grandmother   . Stroke Maternal Grandmother   . Cancer Maternal Aunt        aunt, passed last month, ?RCC  . Cancer Maternal Uncle     Prior to Admission medications   Medication Sig Start Date End Date Taking? Authorizing Provider  amLODipine (NORVASC) 2.5 MG tablet Take 2.5 mg by mouth daily.   Yes [provider]  calcium acetate (PHOSLO) 667 MG capsule Take 2 capsules (1,334 mg total) by mouth 3 (three) times daily with meals. Patient taking differently: Take 2,001 mg by mouth 3 (three) times daily with meals.  01/29/17  Yes Georgette Shell, MD  insulin aspart (NOVOLOG) 100 UNIT/ML injection Inject 0-9 Units into the skin 3 (three) times daily with meals. 01/29/17  Yes Georgette Shell, MD  insulin glargine (LANTUS) 100 UNIT/ML injection Inject 0.25 mLs (25 Units total) into the skin at bedtime. Patient taking differently: Inject 14 Units into the skin at bedtime.  01/29/17  Yes Georgette Shell, MD  metoprolol succinate (TOPROL-XL) 25 MG 24 hr tablet Take 1 tablet (25 mg total) by mouth daily. 01/29/17  Yes Georgette Shell, MD    multivitamin (RENA-VIT) TABS tablet Take 1 tablet by mouth at bedtime. 01/29/17  Yes Georgette Shell, MD  norethindrone (MICRONOR,CAMILA,ERRIN) 0.35 MG tablet Take 1 tablet (0.35 mg total) by mouth daily. 01/29/17  Yes Georgette Shell, MD    Physical Exam: Vitals:   05/13/17 2145 05/13/17 2200 05/13/17 2302 05/13/17 2303  BP: (!) 148/88 (!) 147/89    Pulse: 98 100    Resp:  15    Temp:      TempSrc:      SpO2: 92% 90% (!) 83% 95%  Weight:      Height:         General:  Appears calm and comfortable and is NAD Eyes:  PERRL, EOMI, normal lids, iris ENT:  grossly normal hearing, lips & tongue, mmm; appropriate dentition Neck:  no LAD, masses or thyromegaly Cardiovascular:  RRR, no m/r/g. No LE edema.  Respiratory:  Diffuse but scattered rhonchi on exam throughout lung fields.  Normal respiratory effort on [place] O2. Abdomen:  soft, NT, ND, NABS Back:   normal alignment, no CVAT Skin:  no rash or induration seen on limited exam Musculoskeletal:  grossly normal tone BUE/BLE, good ROM, no bony abnormality Psychiatric:  grossly normal mood and affect, speech fluent and appropriate, AOx3 Neurologic:  CN 2-12 grossly intact, moves all extremities in coordinated fashion, sensation intact    Radiological Exams on Admission: Dg Chest 2 View  Result Date: 05/13/2017 CLINICAL DATA:  Hemoptysis beginning at 8:30 a.m. today. EXAM: CHEST  2 VIEW COMPARISON:  Single-view of the chest 01/26/2017. FINDINGS: Dialysis catheter remains in place. Hazy bilateral airspace disease appears somewhat worse on the right. There is cardiomegaly. No pneumothorax or pleural effusion. IMPRESSION: Hazy bilateral airspace disease could be due to pneumonia, edema or pulmonary hemorrhage. Cardiomegaly. Electronically Signed   By: Inge Rise M.D.   On: 05/13/2017 18:12   Ct Angio Chest Pe W And/or Wo Contrast  Result Date: 05/13/2017 CLINICAL DATA:  Chest pain and dyspnea beginning yesterday. Hemoptysis  this morning. EXAM: CT ANGIOGRAPHY CHEST WITH CONTRAST TECHNIQUE: Multidetector CT imaging of the chest was performed using the standard protocol during bolus administration of intravenous contrast. Multiplanar CT image reconstructions and MIPs were obtained to evaluate the vascular anatomy. CONTRAST:  148mL ISOVUE-370 IOPAMIDOL (ISOVUE-370) INJECTION 76% COMPARISON:  None. FINDINGS: Cardiovascular: Cardiomegaly without pericardial effusion. No aortic aneurysm. No acute central pulmonary embolus. Mediastinum/Nodes: Normal thyroid gland. Unremarkable appearance of the great vessels. Dialysis catheter is noted terminating in the proximal right atrium. No lymphadenopathy. Unremarkable esophagus. Lungs/Pleura: Multilobar consolidative airspace opacities throughout both lungs consistent with pneumonia with differential considerations may include stigmata of pulmonary edema or hemorrhage. Small bilateral pleural effusions right greater than  left. Upper Abdomen: Normal Musculoskeletal: Diffusely sclerotic consistent with renal osteodystrophy. Review of the MIP images confirms the above findings. IMPRESSION: 1. Cardiomegaly with patchy multilobar consolidative airspace opacities and bilateral small pleural effusions right greater than left. Differential possibilities may include multilobar pneumonia with parapneumonic effusions versus stigmata of pulmonary edema or remotely pulmonary hemorrhage. 2. Osteosclerotic appearance of the bony thorax consistent with renal osteodystrophy. Electronically Signed   By: Ashley Royalty M.D.   On: 05/13/2017 22:59    EKG: Independently reviewed.  NSR with rate 98; no evidence of acute ischemia; NSCSLT   Labs on Admission: I have personally reviewed the available labs and imaging studies at the time of the admission.  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 -Sepsis protocol initiated; no IVF bolus due to ESRD and lack of hypotension. -Given cough with hemoptysis, mildly decreased oxygen saturation, and multifocal infiltrates on chest CT, most likely healthcare-associated pneumonia in this HD patient.  -It is also possible that the patient has a component of volume overload but she is due for HD tomorrow and this should be easily assuaged at that time. -CURB-65 score is 1 - will admit the patient to Med Surg. -Pneumonia Severity Index (PSI) is Class 1, 0.6% mortality. -The patient has the following criteria for MDR (multi-drug resistance): HD patients. -The patient will need treatment for HCAP due to MDR risk factors as above; will treat with Zosyn and Vancomycin. -Additional complicating factors include: hypoxia; hemoptysis; and probable immunocompromise. -Fever control -Repeat CBC in am -Sputum cultures -Blood cultures -Strep pneumo testing -Will order lower respiratory tract procalcitonin level.  Antibiotics would not be indicated for PCT <0.1 and probably should not be used for < 0.25.  >0.5 indicates infection and >>0.5 indicates more serious disease.  As the procalcitonin level normalizes, it will be reasonable to consider de-escalation of antibiotic coverage. -Will admit and continue to monitor -Will add HIV  Poorly controlled type 1 DM -A1c was 7/0 in 8/18, which is improved from prior -Continue home Lantus -Cover with moderate-scale SSI including meal coverage and qhs -Check current A1c  ESRD -Patient on chronic TTS HD -Nephrology prn order set utilized -She does not appear to be in need of acute HD -Dr. Moshe Cipro was consulted  by the ER and so should be aware of the patient and able to add her to the dialysis list for tomorrow   DVT prophylaxis:  Lovenox Code Status:  Full - confirmed with patient/family Family Communication: Mother present throughout evalaution Disposition Plan:  Home once clinically improved Consults called: Nephrology  Admission status: Admit - It is my clinical opinion that admission to INPATIENT is reasonable and necessary because this patient will require at least 2 midnights in the hospital to treat this condition based on the medical complexity of the problems presented.  Given the aforementioned information, the predictability of an adverse outcome is felt to be significant.    Karmen Bongo MD [facility]  If note is complete, please contact covering daytime or nighttime physician. [email or web address] Password [facility]  05/14/2017, 12:49 AM

## 2017-05-14 NOTE — Progress Notes (Signed)
Pharmacy Antibiotic Note  [name] is a 26 y.o. female admitted on 05/13/2017 with pneumonia.  Pharmacy has been consulted for Vancomycin/Zosyn dosing. WBC 11.1. ESRD on HD TTS.   Plan: Vancomycin 1250 mg IV x 1, then 750 mg IV qHD TTS Zosyn 3.375G IV q12h to be infused over 4 hours Trend WBC, temp, renal function  F/U infectious work-up Drug levels as indicated   Height: 5\' 1"  (154.9 cm) Weight: 135 lb (61.2 kg) IBW/kg (Calculated) : 47.8  Temp (24hrs), Avg:99.5 F (37.5 C), Min:99.2 F (37.3 C), Max:99.7 F (37.6 C)  Recent Labs  Lab 05/13/17 2153 05/14/17 0009 05/14/17 0058  WBC 13.0*  --  11.1*  CREATININE 9.66*  --  9.71*  LATICACIDVEN  --  1.33  --     Estimated Creatinine Clearance: 7.4 mL/min (A) (by C-G formula based on SCr of 9.71 mg/dL (H)).    No Known Allergies  Narda Bonds 05/14/2017 2:02 AM

## 2017-05-15 DIAGNOSIS — I10 Essential (primary) hypertension: Secondary | ICD-10-CM

## 2017-05-15 LAB — GLUCOSE, CAPILLARY
GLUCOSE-CAPILLARY: 356 mg/dL — AB (ref 65–99)
GLUCOSE-CAPILLARY: 377 mg/dL — AB (ref 65–99)
Glucose-Capillary: 88 mg/dL (ref 65–99)
Glucose-Capillary: 349 mg/dL — ABNORMAL HIGH (ref 65–99)

## 2017-05-15 LAB — CBC WITH DIFFERENTIAL/PLATELET
Basophils Absolute: 0 10*3/uL (ref 0.0–0.1)
Basophils Relative: 1 %
EOS ABS: 0.2 10*3/uL (ref 0.0–0.7)
EOS PCT: 3 %
HCT: 35.9 % — ABNORMAL LOW (ref 36.0–46.0)
Hemoglobin: 11.6 g/dL — ABNORMAL LOW (ref 12.0–15.0)
LYMPHS ABS: 0.8 10*3/uL (ref 0.7–4.0)
Lymphocytes Relative: 17 %
MCH: 27.2 pg (ref 26.0–34.0)
MCHC: 32.3 g/dL (ref 30.0–36.0)
MCV: 84.3 fL (ref 78.0–100.0)
MONO ABS: 0.5 10*3/uL (ref 0.1–1.0)
MONOS PCT: 9 %
Neutro Abs: 3.4 10*3/uL (ref 1.7–7.7)
Neutrophils Relative %: 70 %
Platelets: 133 10*3/uL — ABNORMAL LOW (ref 150–400)
RBC: 4.26 MIL/uL (ref 3.87–5.11)
RDW: 18.1 % — AB (ref 11.5–15.5)
WBC: 4.8 10*3/uL (ref 4.0–10.5)

## 2017-05-15 LAB — RENAL FUNCTION PANEL
Albumin: 2.6 g/dL — ABNORMAL LOW (ref 3.5–5.0)
Anion gap: 11 (ref 5–15)
BUN: 38 mg/dL — AB (ref 6–20)
CHLORIDE: 96 mmol/L — AB (ref 101–111)
CO2: 24 mmol/L (ref 22–32)
CREATININE: 7.18 mg/dL — AB (ref 0.44–1.00)
Calcium: 7.4 mg/dL — ABNORMAL LOW (ref 8.9–10.3)
GFR calc Af Amer: 8 mL/min — ABNORMAL LOW (ref >60)
GFR, EST NON AFRICAN AMERICAN: 7 mL/min — AB (ref >60)
GLUCOSE: 396 mg/dL — AB (ref 65–99)
Phosphorus: 6.1 mg/dL — ABNORMAL HIGH (ref 2.5–4.6)
Potassium: 4.2 mmol/L (ref 3.5–5.1)
Sodium: 131 mmol/L — ABNORMAL LOW (ref 135–145)

## 2017-05-15 LAB — HEMOGLOBIN A1C
Hgb A1c MFr Bld: 9.1 % — ABNORMAL HIGH (ref 4.8–5.6)
Mean Plasma Glucose: 214 mg/dL

## 2017-05-15 MED ORDER — INSULIN GLARGINE 100 UNIT/ML ~~LOC~~ SOLN
20.0000 [IU] | Freq: Every day | SUBCUTANEOUS | Status: DC
  Administered 2017-05-15: 20 [IU] via SUBCUTANEOUS
  Filled 2017-05-15 (×2): qty 0.2

## 2017-05-15 NOTE — Progress Notes (Signed)
[facility] Progress Note   Subjective:  Seen in room, breathing improved slightly. S/p 3L removed with HD yesterday. Still using nasal oxygen. Has not coughed up anymore blood. No fever or CP.   Objective Vitals:   05/14/17 1630 05/14/17 1900 05/14/17 2027 05/15/17 0505  BP: (!) 169/94 (!) 147/81 129/76 114/71  Pulse: 100 95 94 83  Resp: 18 18 18 17   Temp: 98.6 F (37 C)  98.8 F (37.1 C) 98.2 F (36.8 C)  TempSrc: Oral  Oral Oral  SpO2: 100% 100% 100% 100%  Weight: 59.1 kg (130 lb 4.7 oz)     Height:       Physical Exam General: Well appearing female, NAD. Wearing nasal oxygen. Heart: Tachycardic, normal rhythm. No murmur Lungs: CTAB Abdomen: soft, non-tender Extremities: No LE edema Dialysis Access: L AVF + thrill, TDC still in place.  Additional Objective Labs: Basic Metabolic Panel: Recent Labs  Lab 05/13/17 2153 05/14/17 0058 05/15/17 0549  NA 134* 132* 131*  K 4.8 4.3 4.2  CL 99* 100* 96*  CO2 22 19* 24  GLUCOSE 218* 171* 396*  BUN 52* 54* 38*  CREATININE 9.66* 9.71* 7.18*  CALCIUM 7.6* 7.2* 7.4*  PHOS  --   --  6.1*   Liver Function Tests: Recent Labs  Lab 05/15/17 0549  ALBUMIN 2.6*   CBC: Recent Labs  Lab 05/13/17 2153 05/14/17 0058 05/15/17 0549  WBC 13.0* 11.1* 4.8  NEUTROABS 11.3* 9.3* 3.4  HGB 12.5 12.2 11.6*  HCT 38.7 38.0 35.9*  MCV 83.8 83.9 84.3  PLT 163 151 133*   CBG: Recent Labs  Lab 05/14/17 0729 05/14/17 1146 05/14/17 2130 05/15/17 0738  GLUCAP 135* 90 465* 356*   Studies/Results: Dg Chest 2 View  Result Date: 05/13/2017 CLINICAL DATA:  Hemoptysis beginning at 8:30 a.m. today. EXAM: CHEST  2 VIEW COMPARISON:  Single-view of the chest 01/26/2017. FINDINGS: Dialysis catheter remains in place. Hazy bilateral airspace disease appears somewhat worse on the right. There is cardiomegaly. No pneumothorax or pleural effusion. IMPRESSION: Hazy bilateral airspace disease could be due to pneumonia, edema or  pulmonary hemorrhage. Cardiomegaly. Electronically Signed   By: Inge Rise M.D.   On: 05/13/2017 18:12   Ct Angio Chest Pe W And/or Wo Contrast  Result Date: 05/13/2017 CLINICAL DATA:  Chest pain and dyspnea beginning yesterday. Hemoptysis this morning. EXAM: CT ANGIOGRAPHY CHEST WITH CONTRAST TECHNIQUE: Multidetector CT imaging of the chest was performed using the standard protocol during bolus administration of intravenous contrast. Multiplanar CT image reconstructions and MIPs were obtained to evaluate the vascular anatomy. CONTRAST:  173mL ISOVUE-370 IOPAMIDOL (ISOVUE-370) INJECTION 76% COMPARISON:  None. FINDINGS: Cardiovascular: Cardiomegaly without pericardial effusion. No aortic aneurysm. No acute central pulmonary embolus. Mediastinum/Nodes: Normal thyroid gland. Unremarkable appearance of the great vessels. Dialysis catheter is noted terminating in the proximal right atrium. No lymphadenopathy. Unremarkable esophagus. Lungs/Pleura: Multilobar consolidative airspace opacities throughout both lungs consistent with pneumonia with differential considerations may include stigmata of pulmonary edema or hemorrhage. Small bilateral pleural effusions right greater than left. Upper Abdomen: Normal Musculoskeletal: Diffusely sclerotic consistent with renal osteodystrophy. Review of the MIP images confirms the above findings. IMPRESSION: 1. Cardiomegaly with patchy multilobar consolidative airspace opacities and bilateral small pleural effusions right greater than left. Differential possibilities may include multilobar pneumonia with parapneumonic effusions versus stigmata of pulmonary edema or remotely pulmonary hemorrhage. 2. Osteosclerotic appearance of the bony thorax consistent with renal osteodystrophy. Electronically Signed   By: Meredith Leeds.D.  On: 05/13/2017 22:59   Medications: . piperacillin-tazobactam (ZOSYN)  IV Stopped (05/15/17 0340)  . vancomycin Stopped (05/14/17 1828)   .  amLODipine  7.5 mg Oral Daily  . calcium acetate  2,001 mg Oral TID WC  . doxercalciferol  4 mcg Intravenous Q T,Th,Sa-HD  . enoxaparin (LOVENOX) injection  30 mg Subcutaneous Q24H  . insulin aspart  0-15 Units Subcutaneous TID WC  . insulin aspart  0-5 Units Subcutaneous QHS  . insulin aspart  3 Units Subcutaneous TID WC  . insulin glargine  14 Units Subcutaneous QHS  . metoprolol succinate  25 mg Oral Daily  . multivitamin  1 tablet Oral QHS  . norethindrone  1 tablet Oral Daily    Dialysis Orders: TTS at [facility], BFR 400, DFR 800, EDW 58.5kg, 2K/2.25Ca bath, TDC and AVF, heparin 1800 bolus - Hectoral 38mcg IV q HD - Venofer 50mg  IV weekly  Assessment/Plan: 1. Multilobular Pneumonia (w/ hemoptysis): Improving slightly. BCx pending, continue Vanc/Zosyn.  2.  ESRD: Usually TTS schedule via AVF. Next planned HD 11/27, although I think would benefit from a short extra treatment tomorrow given the abnormality of her chest imaging 3.   Hypertension/volume: BP controlled, close to EDW now, will try to challenge down further if tolerated- extra tx on Mon. 4.  Anemia: Hgb 11.6. No ESA for now. 5.  Metabolic bone disease: Ca slightly low, Phos high. Continue Phoslo/Hectoral for now. 6.  Type 1 DM: on insulin, per primary. Sugar 300 this AM      Veneta Penton, PA-C 05/15/2017, 8:33 AM  [facility] Pager: [phone]  Patient seen and examined, agree with above note with above modifications. Doing pretty well this AM- better- no more hemoptysis- I think would benefit from short extra tx tomorrow just because her chest imaging looked so abnormal- eliminate aspect of volume  Corliss Parish, MD 05/15/2017

## 2017-05-15 NOTE — Progress Notes (Signed)
PROGRESS NOTE [facility]   AALIA GREULICH   VZD:[id number] DOB: May 23, 1991  DOA: 05/13/2017 PCP: Patient, No Pcp Per   Brief Narrative:  [name] is a 26 year old female with medical history of type 1 diabetes poorly controlled, end-stage renal disease on hemodialysis, hypertension who presented to the emergency department complaining of hemoptysis.  Upon ED evaluation patient was found to have multifocal pneumonia patient was admitted for further treatment.  Subjective: Patient seen and examined, she is doing well although remain oxygen this morning.  Denies further hemoptysis.  No chest pain, shortness of breath or palpitations.  Assessment & Plan: Multifocal pneumonia - treating as HCAP, given frequent visit to hemodialysis center and immunocompromised patient. Continue Zosyn for now, can DC vancomycin as MRSA screen was negative. We will not use pro-calcitonin as reliable measure, patient with chronic renal disease we will not clear for calcitonin well.  Continue treatment for some clinical improvement. When oxygen as tolerated WBC improving possible transition to oral medications in the morning. Blood cultures pending Continue supportive treatment  Type 1 diabetes mellitus poorly controlled with hyperglycemia A1c 9.1 Increase Lantus to 20 units  continue NovoLog 3 units with meals Continue insulin sliding scale Monitor CBGs  End-stage renal disease  HD per nephrology  Hypertension BP stable Continue metoprolol and Norvasc  DVT prophylaxis: Lovenox Code Status: Full code Family Communication: Father at bedside Disposition Plan: Home in the next 24 hours   Consultants:   Nephrology  Procedures:   None  Antimicrobials:  Vancomycin 11/24 -11/25  Zosyn 11/24 >>  Objective: Vitals:   05/14/17 1900 05/14/17 2027 05/15/17 0505 05/15/17 0934  BP: (!) 147/81 129/76 114/71 125/74  Pulse: 95 94 83 86  Resp: 18 18 17 16   Temp:  98.8 F  (37.1 C) 98.2 F (36.8 C) 98.5 F (36.9 C)  TempSrc:  Oral Oral Oral  SpO2: 100% 100% 100% 95%  Weight:      Height:        Intake/Output Summary (Last 24 hours) at 05/15/2017 1103 Last data filed at 05/15/2017 0900 Gross per 24 hour  Intake 730 ml  Output 3001 ml  Net -2271 ml   Filed Weights   05/13/17 1731 05/14/17 1630  Weight: 61.2 kg (135 lb) 59.1 kg (130 lb 4.7 oz)    Examination:  General exam: Appears calm and comfortable  HEENT: OP moist and clear Respiratory system: Breath sounds diminished bilaterally, no wheezing or crackles Cardiovascular system: S1 & S2 heard, RRR. No JVD, murmurs, rubs or gallops Gastrointestinal system: Abdomen is nondistended, soft and nontender. Normal bowel sounds heard. Central nervous system: Alert and oriented. No focal neurological deficits. Extremities: No LE edema, left upper extremity fistula good thrill Skin: No rashes, lesions or ulcers Psychiatry: Judgement and insight appear normal. Mood & affect appropriate.    Data Reviewed: I have personally reviewed following labs and imaging studies  CBC: Recent Labs  Lab 05/13/17 2153 05/14/17 0058 05/15/17 0549  WBC 13.0* 11.1* 4.8  NEUTROABS 11.3* 9.3* 3.4  HGB 12.5 12.2 11.6*  HCT 38.7 38.0 35.9*  MCV 83.8 83.9 84.3  PLT 163 151 295*   Basic Metabolic Panel: Recent Labs  Lab 05/13/17 2153 05/14/17 0058 05/15/17 0549  NA 134* 132* 131*  K 4.8 4.3 4.2  CL 99* 100* 96*  CO2 22 19* 24  GLUCOSE 218* 171* 396*  BUN 52* 54* 38*  CREATININE 9.66* 9.71* 7.18*  CALCIUM 7.6* 7.2* 7.4*  PHOS  --   --  6.1*   GFR: Estimated Creatinine Clearance: 9.8 mL/min (A) (by C-G formula based on SCr of 7.18 mg/dL (H)). Liver Function Tests: Recent Labs  Lab 05/15/17 0549  ALBUMIN 2.6*   No results for input(s): LIPASE, AMYLASE in the last 168 hours. No results for input(s): AMMONIA in the last 168 hours. Coagulation Profile: No results for input(s): INR, PROTIME in the last  168 hours. Cardiac Enzymes: No results for input(s): CKTOTAL, CKMB, CKMBINDEX, TROPONINI in the last 168 hours. BNP (last 3 results) No results for input(s): PROBNP in the last 8760 hours. HbA1C: Recent Labs    05/14/17 0053  HGBA1C 9.1*   CBG: Recent Labs  Lab 05/14/17 0729 05/14/17 1146 05/14/17 2130 05/15/17 0738  GLUCAP 135* 90 465* 356*   Lipid Profile: No results for input(s): CHOL, HDL, LDLCALC, TRIG, CHOLHDL, LDLDIRECT in the last 72 hours. Thyroid Function Tests: No results for input(s): TSH, T4TOTAL, FREET4, T3FREE, THYROIDAB in the last 72 hours. Anemia Panel: No results for input(s): VITAMINB12, FOLATE, FERRITIN, TIBC, IRON, RETICCTPCT in the last 72 hours. Sepsis Labs: Recent Labs  Lab 05/14/17 0009 05/14/17 0053  PROCALCITON  --  0.26  LATICACIDVEN 1.33  --     Recent Results (from the past 240 hour(s))  MRSA PCR Screening     Status: None   Collection Time: 05/14/17  3:10 AM  Result Value Ref Range Status   MRSA by PCR NEGATIVE NEGATIVE Final    Comment:        The GeneXpert MRSA Assay (FDA approved for NASAL specimens only), is one component of a comprehensive MRSA colonization surveillance program. It is not intended to diagnose MRSA infection nor to guide or monitor treatment for MRSA infections.       Radiology Studies: Dg Chest 2 View  Result Date: 05/13/2017 CLINICAL DATA:  Hemoptysis beginning at 8:30 a.m. today. EXAM: CHEST  2 VIEW COMPARISON:  Single-view of the chest 01/26/2017. FINDINGS: Dialysis catheter remains in place. Hazy bilateral airspace disease appears somewhat worse on the right. There is cardiomegaly. No pneumothorax or pleural effusion. IMPRESSION: Hazy bilateral airspace disease could be due to pneumonia, edema or pulmonary hemorrhage. Cardiomegaly. Electronically Signed   By: Inge Rise M.D.   On: 05/13/2017 18:12   Ct Angio Chest Pe W And/or Wo Contrast  Result Date: 05/13/2017 CLINICAL DATA:  Chest pain  and dyspnea beginning yesterday. Hemoptysis this morning. EXAM: CT ANGIOGRAPHY CHEST WITH CONTRAST TECHNIQUE: Multidetector CT imaging of the chest was performed using the standard protocol during bolus administration of intravenous contrast. Multiplanar CT image reconstructions and MIPs were obtained to evaluate the vascular anatomy. CONTRAST:  122mL ISOVUE-370 IOPAMIDOL (ISOVUE-370) INJECTION 76% COMPARISON:  None. FINDINGS: Cardiovascular: Cardiomegaly without pericardial effusion. No aortic aneurysm. No acute central pulmonary embolus. Mediastinum/Nodes: Normal thyroid gland. Unremarkable appearance of the great vessels. Dialysis catheter is noted terminating in the proximal right atrium. No lymphadenopathy. Unremarkable esophagus. Lungs/Pleura: Multilobar consolidative airspace opacities throughout both lungs consistent with pneumonia with differential considerations may include stigmata of pulmonary edema or hemorrhage. Small bilateral pleural effusions right greater than left. Upper Abdomen: Normal Musculoskeletal: Diffusely sclerotic consistent with renal osteodystrophy. Review of the MIP images confirms the above findings. IMPRESSION: 1. Cardiomegaly with patchy multilobar consolidative airspace opacities and bilateral small pleural effusions right greater than left. Differential possibilities may include multilobar pneumonia with parapneumonic effusions versus stigmata of pulmonary edema or remotely pulmonary hemorrhage. 2. Osteosclerotic appearance of the bony thorax consistent with renal osteodystrophy. Electronically Signed   By:  Ashley Royalty M.D.   On: 05/13/2017 22:59    Scheduled Meds: . amLODipine  7.5 mg Oral Daily  . calcium acetate  2,001 mg Oral TID WC  . doxercalciferol  4 mcg Intravenous Q T,Th,Sa-HD  . enoxaparin (LOVENOX) injection  30 mg Subcutaneous Q24H  . insulin aspart  0-15 Units Subcutaneous TID WC  . insulin aspart  0-5 Units Subcutaneous QHS  . insulin aspart  3 Units  Subcutaneous TID WC  . insulin glargine  14 Units Subcutaneous QHS  . metoprolol succinate  25 mg Oral Daily  . multivitamin  1 tablet Oral QHS  . norethindrone  1 tablet Oral Daily   Continuous Infusions: . piperacillin-tazobactam (ZOSYN)  IV 3.375 g (05/15/17 1101)  . vancomycin Stopped (05/14/17 1828)     LOS: 1 day    Time spent: Total of 25 minutes spent with pt, greater than 50% of which was spent in discussion of  treatment, counseling and coordination of care    Chipper Oman, MD Pager: Text Page via [email or web address]   If 7PM-7AM, please contact night-coverage [email or web address] 05/15/2017, 11:03 AM

## 2017-05-16 ENCOUNTER — Inpatient Hospital Stay (HOSPITAL_COMMUNITY): Payer: Medicaid Other

## 2017-05-16 ENCOUNTER — Encounter (HOSPITAL_COMMUNITY): Payer: Self-pay | Admitting: Physician Assistant

## 2017-05-16 DIAGNOSIS — E1029 Type 1 diabetes mellitus with other diabetic kidney complication: Secondary | ICD-10-CM

## 2017-05-16 DIAGNOSIS — J189 Pneumonia, unspecified organism: Secondary | ICD-10-CM

## 2017-05-16 DIAGNOSIS — N186 End stage renal disease: Secondary | ICD-10-CM

## 2017-05-16 DIAGNOSIS — E1065 Type 1 diabetes mellitus with hyperglycemia: Secondary | ICD-10-CM

## 2017-05-16 DIAGNOSIS — E1010 Type 1 diabetes mellitus with ketoacidosis without coma: Secondary | ICD-10-CM

## 2017-05-16 HISTORY — PX: IR REMOVAL TUN CV CATH W/O FL: IMG2289

## 2017-05-16 LAB — CBC
HCT: 33.6 % — ABNORMAL LOW (ref 36.0–46.0)
HEMOGLOBIN: 10.9 g/dL — AB (ref 12.0–15.0)
MCH: 26.9 pg (ref 26.0–34.0)
MCHC: 32.4 g/dL (ref 30.0–36.0)
MCV: 83 fL (ref 78.0–100.0)
PLATELETS: 173 10*3/uL (ref 150–400)
RBC: 4.05 MIL/uL (ref 3.87–5.11)
RDW: 17.9 % — ABNORMAL HIGH (ref 11.5–15.5)
WBC: 4.3 10*3/uL (ref 4.0–10.5)

## 2017-05-16 LAB — RENAL FUNCTION PANEL
ALBUMIN: 2.9 g/dL — AB (ref 3.5–5.0)
ANION GAP: 14 (ref 5–15)
BUN: 54 mg/dL — ABNORMAL HIGH (ref 6–20)
CHLORIDE: 97 mmol/L — AB (ref 101–111)
CO2: 22 mmol/L (ref 22–32)
Calcium: 8.3 mg/dL — ABNORMAL LOW (ref 8.9–10.3)
Creatinine, Ser: 9.55 mg/dL — ABNORMAL HIGH (ref 0.44–1.00)
GFR, EST AFRICAN AMERICAN: 6 mL/min — AB (ref >60)
GFR, EST NON AFRICAN AMERICAN: 5 mL/min — AB (ref >60)
Glucose, Bld: 384 mg/dL — ABNORMAL HIGH (ref 65–99)
PHOSPHORUS: 5.6 mg/dL — AB (ref 2.5–4.6)
POTASSIUM: 4.4 mmol/L (ref 3.5–5.1)
Sodium: 133 mmol/L — ABNORMAL LOW (ref 135–145)

## 2017-05-16 LAB — GLUCOSE, CAPILLARY
GLUCOSE-CAPILLARY: 325 mg/dL — AB (ref 65–99)
Glucose-Capillary: 152 mg/dL — ABNORMAL HIGH (ref 65–99)
Glucose-Capillary: 129 mg/dL — ABNORMAL HIGH (ref 65–99)

## 2017-05-16 MED ORDER — CEFPODOXIME PROXETIL 200 MG PO TABS: 200.0000 mg | ORAL_TABLET | ORAL | 0 refills | Status: DC

## 2017-05-16 MED ORDER — CHLORHEXIDINE GLUCONATE 4 % EX LIQD
CUTANEOUS | Status: AC
  Filled 2017-05-16: qty 15

## 2017-05-16 MED ORDER — INSULIN GLARGINE 100 UNIT/ML ~~LOC~~ SOLN: 20.0000 [IU] | Freq: Every day | SUBCUTANEOUS | 11 refills | Status: DC

## 2017-05-16 MED ORDER — DOXERCALCIFEROL 4 MCG/2ML IV SOLN
4.0000 ug | INTRAVENOUS | Status: DC
  Administered 2017-05-16: 4 ug via INTRAVENOUS

## 2017-05-16 MED ORDER — CHLORHEXIDINE GLUCONATE 4 % EX LIQD
CUTANEOUS | Status: DC | PRN
  Administered 2017-05-16: 1 via TOPICAL

## 2017-05-16 MED ORDER — LIDOCAINE-EPINEPHRINE 0.5 %-1:200000 IJ SOLN
INTRAMUSCULAR | Status: DC | PRN
Start: 2017-05-16 — End: 2017-05-16
  Administered 2017-05-16: 10 mL

## 2017-05-16 MED ORDER — CEFPODOXIME PROXETIL 200 MG PO TABS
200.0000 mg | ORAL_TABLET | ORAL | Status: DC
  Administered 2017-05-16: 200 mg via ORAL
  Filled 2017-05-16: qty 1

## 2017-05-16 MED ORDER — LIDOCAINE HCL 1 % IJ SOLN
INTRAMUSCULAR | Status: AC
  Filled 2017-05-16: qty 20

## 2017-05-16 MED ORDER — LIDOCAINE-EPINEPHRINE 0.5 %-1:200000 IJ SOLN
30.0000 mL | Freq: Once | INTRAMUSCULAR | Status: DC
  Filled 2017-05-16: qty 30

## 2017-05-16 MED ORDER — DOXERCALCIFEROL 4 MCG/2ML IV SOLN
INTRAVENOUS | Status: AC
  Filled 2017-05-16: qty 2

## 2017-05-16 MED ORDER — CALCIUM ACETATE (PHOS BINDER) 667 MG PO CAPS: 2001.0000 mg | ORAL_CAPSULE | Freq: Three times a day (TID) | ORAL | 2 refills | Status: DC

## 2017-05-16 NOTE — Discharge Summary (Signed)
Physician Discharge Summary   Patient ID: [name] MRN: [id number] DOB/AGE: 17-Mar-1991 26 y.o.  Admit date: 05/13/2017 Discharge date: 05/16/2017  Primary Care Physician:  Patient, No Pcp Per  Discharge Diagnoses:    Marland Kitchen Multifocal pneumonia . ESRD (end stage renal disease) ([place]) . 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 Please repeat CBC/BMET at next visit 2. Repeat CT chest or chest x-ray in 4 weeks to ensure complete resolution of pneumonia 3. Patient will need outpatient TDC removed, IR will arrange it    DIET: Carb modified diet    Allergies:  No Known Allergies   DISCHARGE MEDICATIONS: Current Discharge Medication List    START taking these medications   Details  cefpodoxime (VANTIN) 200 MG tablet Take 1 tablet (200 mg total) by mouth every other day. After hemodialysis for 4 more doses Qty: 4 tablet, Refills: 0      CONTINUE these medications which have CHANGED   Details  calcium acetate (PHOSLO) 667 MG capsule Take 3 capsules (2,001 mg total) by mouth 3 (three) times daily with meals. Qty: 180 capsule, Refills: 2    insulin glargine (LANTUS) 100 UNIT/ML injection Inject 0.2 mLs (20 Units total) into the skin at bedtime. Qty: 10 mL, Refills: 11      CONTINUE these medications which have NOT CHANGED   Details  amLODipine (NORVASC) 2.5 MG tablet Take 7.5 mg by mouth daily.     insulin aspart (NOVOLOG) 100 UNIT/ML injection Inject 0-9 Units into the skin 3 (three) times daily with meals. Qty: 10 mL, Refills: 11    metoprolol succinate (TOPROL-XL) 25 MG 24 hr tablet Take 1 tablet (25 mg total) by mouth daily. Qty: 30 tablet, Refills: 0    multivitamin (RENA-VIT) TABS tablet Take 1 tablet by mouth at bedtime. Qty: 30 tablet, Refills: 0    norethindrone (MICRONOR,CAMILA,ERRIN) 0.35 MG tablet Take 1 tablet (0.35 mg  total) by mouth daily. Qty: 1 Package, Refills: 11         Brief H and P: For complete details please refer to admission H and P, but in brief [name] is a 26 year old female with medical history of type 1 diabetes poorly controlled, end-stage renal disease on hemodialysis, hypertension who presented to the emergency department complaining of hemoptysis.  Upon ED evaluation patient was found to have multifocal pneumonia patient was admitted for further treatment.   Hospital Course:     Multifocal pneumonia: Treating as HCAP given visits for hemodialysis  -CT angiogram showed no pulmonary embolism however cardiomegaly with patchy multi lobar consolidative airspace opacities and bilateral small pleural effusions right greater than left -Nephrology was consulted and patient underwent hemodialysis for possible pulmonary edema -Patient was placed on IV vancomycin and Zosyn, day #4 of antibiotics -No fevers, leukocytosis improved, transition to oral antibiotics, Vantin after hemodialysis for 4 doses  -Blood cultures negative, recommend CT chest or chest x-ray in 4 weeks to ensure complete resolution of pneumonia    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 - It will be arranged by IR outpatient.  - called by Dr Jonnie Finner, okay to discharge home.   Day of  Discharge BP (!) 149/74   Pulse 95   Temp 98.1 F (36.7 C) (Oral)   Resp 17   Ht 5\' 1"  (1.549 m)   Wt 57.8 kg (127 lb 6.8 oz) Comment: standing weight  LMP 05/06/2017   SpO2 98%   BMI 24.08 kg/m   Physical Exam: General: Alert and awake oriented x3 not in any acute distress. HEENT: anicteric sclera, pupils  reactive to light and accommodation CVS: S1-S2 clear no murmur rubs or gallops Chest: clear to auscultation bilaterally, no wheezing rales or rhonchi Abdomen: soft nontender, nondistended, normal bowel sounds Extremities: no cyanosis, clubbing or edema noted bilaterally Neuro: Cranial nerves II-XII intact, no focal neurological deficits   The results of significant diagnostics from this hospitalization (including imaging, microbiology, ancillary and laboratory) are listed below for reference.    LAB RESULTS: Basic Metabolic Panel: Recent Labs  Lab 05/15/17 0549 05/16/17 0252  NA 131* 133*  K 4.2 4.4  CL 96* 97*  CO2 24 22  GLUCOSE 396* 384*  BUN 38* 54*  CREATININE 7.18* 9.55*  CALCIUM 7.4* 8.3*  PHOS 6.1* 5.6*   Liver Function Tests: Recent Labs  Lab 05/15/17 0549 05/16/17 0252  ALBUMIN 2.6* 2.9*   No results for input(s): LIPASE, AMYLASE in the last 168 hours. No results for input(s): AMMONIA in the last 168 hours. CBC: Recent Labs  Lab 05/15/17 0549 05/16/17 0817  WBC 4.8 4.3  NEUTROABS 3.4  --   HGB 11.6* 10.9*  HCT 35.9* 33.6*  MCV 84.3 83.0  PLT 133* 173   Cardiac Enzymes: No results for input(s): CKTOTAL, CKMB, CKMBINDEX, TROPONINI in the last 168 hours. BNP: Invalid input(s): POCBNP CBG: Recent Labs  Lab 05/16/17 0731 05/16/17 1312  GLUCAP 325* 129*    Significant Diagnostic Studies:  Dg Chest 2 View  Result Date: 05/13/2017 CLINICAL DATA:  Hemoptysis beginning at 8:30 a.m. today. EXAM: CHEST  2 VIEW COMPARISON:  Single-view of the chest 01/26/2017. FINDINGS: Dialysis catheter remains in place. Hazy bilateral airspace disease appears somewhat worse on the right. There is cardiomegaly. No pneumothorax or pleural effusion. IMPRESSION: Hazy bilateral airspace disease could be due to pneumonia, edema or pulmonary hemorrhage. Cardiomegaly. Electronically Signed   By: Inge Rise M.D.   On: 05/13/2017 18:12   Ct Angio Chest Pe W And/or Wo  Contrast  Result Date: 05/13/2017 CLINICAL DATA:  Chest pain and dyspnea beginning yesterday. Hemoptysis this morning. EXAM: CT ANGIOGRAPHY CHEST WITH CONTRAST TECHNIQUE: Multidetector CT imaging of the chest was performed using the standard protocol during bolus administration of intravenous contrast. Multiplanar CT image reconstructions and MIPs were obtained to evaluate the vascular anatomy. CONTRAST:  197mL ISOVUE-370 IOPAMIDOL (ISOVUE-370) INJECTION 76% COMPARISON:  None. FINDINGS: Cardiovascular: Cardiomegaly without pericardial effusion. No aortic aneurysm. No acute central pulmonary embolus. Mediastinum/Nodes: Normal thyroid gland. Unremarkable appearance of the great vessels. Dialysis catheter is noted terminating in the proximal right atrium. No lymphadenopathy. Unremarkable esophagus. Lungs/Pleura: Multilobar consolidative airspace opacities throughout both lungs consistent with pneumonia with differential considerations may include stigmata of pulmonary edema or hemorrhage. Small bilateral pleural effusions right greater than left. Upper Abdomen: Normal Musculoskeletal: Diffusely sclerotic consistent with renal osteodystrophy. Review of the MIP images confirms the above findings. IMPRESSION: 1. Cardiomegaly with patchy multilobar consolidative airspace opacities and bilateral small pleural effusions right greater than left. Differential possibilities may include multilobar pneumonia with parapneumonic effusions versus stigmata of pulmonary edema or remotely pulmonary hemorrhage. 2. Osteosclerotic appearance of the bony thorax consistent with renal osteodystrophy. Electronically  Signed   By: Ashley Royalty M.D.   On: 05/13/2017 22:59    2D ECHO:   Disposition and Follow-up: Discharge Instructions    Diet Carb Modified   Complete by:  As directed    Discharge instructions   Complete by:  As directed    Follow-up outpatient with [facility] and PCP [facility] in 2 weeks    Increase activity slowly   Complete by:  As directed        DISPOSITION: Home   DISCHARGE FOLLOW-UP Follow-up outpatient with [facility] and PCP [facility] in 2 weeks   Time spent on Discharge: 25 minutes  Signed:   Estill Cotta M.D. [facility] 05/16/2017, 3:56 PM Pager: [phone]

## 2017-05-16 NOTE — Progress Notes (Signed)
Pt discharged home with family member. Scripts and AVS reviewed and sent with patient. All questions addressed. All belongings sent with patient. VSS. BP (!) 149/74   Pulse 95   Temp 98.1 F (36.7 C) (Oral)   Resp 17   Ht 5\' 1"  (1.549 m)   Wt 57.8 kg (127 lb 6.8 oz) Comment: standing weight  LMP 05/06/2017   SpO2 98%   BMI 24.08 kg/m

## 2017-05-16 NOTE — Progress Notes (Addendum)
  [name] Progress Note   Subjective:  No c/o, feeling much better, no further couhging or SOB  Objective Vitals:   05/16/17 0530 05/16/17 0800 05/16/17 0830 05/16/17 0900  BP: 121/64 (!) 151/89 (!) 143/84 119/81  Pulse: 85 89 88 88  Resp: 12 16    Temp: 98.3 F (36.8 C) (!) 97.2 F (36.2 C)    TempSrc:  Oral    SpO2: 97% 99%    Weight:  60.5 kg (133 lb 6.1 oz)    Height:       Physical Exam General: Well appearing female, NAD, nasal O2, no distress, no cough Heart: Tachycardic, normal rhythm. No murmur Lungs: CTAB Abdomen: soft, non-tender Extremities: No LE edema Dialysis Access: L AVF + thrill, TDC still in place.  Additional Objective Labs: Basic Metabolic Panel: Recent Labs  Lab 05/14/17 0058 05/15/17 0549 05/16/17 0252  NA 132* 131* 133*  K 4.3 4.2 4.4  CL 100* 96* 97*  CO2 19* 24 22  GLUCOSE 171* 396* 384*  BUN 54* 38* 54*  CREATININE 9.71* 7.18* 9.55*  CALCIUM 7.2* 7.4* 8.3*  PHOS  --  6.1* 5.6*   Liver Function Tests: Recent Labs  Lab 05/15/17 0549 05/16/17 0252  ALBUMIN 2.6* 2.9*   CBC: Recent Labs  Lab 05/13/17 2153 05/14/17 0058 05/15/17 0549 05/16/17 0817  WBC 13.0* 11.1* 4.8 4.3  NEUTROABS 11.3* 9.3* 3.4  --   HGB 12.5 12.2 11.6* 10.9*  HCT 38.7 38.0 35.9* 33.6*  MCV 83.8 83.9 84.3 83.0  PLT 163 151 133* PENDING   CBG: Recent Labs  Lab 05/15/17 0738 05/15/17 1145 05/15/17 1723 05/15/17 2153 05/16/17 0731  GLUCAP 356* 88 377* 349* 325*   Studies/Results: No results found. Medications: . piperacillin-tazobactam (ZOSYN)  IV Stopped (05/16/17 0233)   . amLODipine  7.5 mg Oral Daily  . calcium acetate  2,001 mg Oral TID WC  . doxercalciferol  4 mcg Intravenous Q T,Th,Sa-HD  . enoxaparin (LOVENOX) injection  30 mg Subcutaneous Q24H  . insulin aspart  0-15 Units Subcutaneous TID WC  . insulin aspart  0-5 Units Subcutaneous QHS  . insulin aspart  3 Units Subcutaneous TID WC  . insulin glargine  20 Units  Subcutaneous QHS  . metoprolol succinate  25 mg Oral Daily  . multivitamin  1 tablet Oral QHS  . norethindrone  1 tablet Oral Daily    Dialysis Orders: TTS at [facility], BFR 400, DFR 800, EDW 58.5kg, 2K/2.25Ca bath, TDC and AVF, heparin 1800 bolus - Hectoral 3mcg IV q HD - Venofer 50mg  IV weekly  Assessment/Plan: 1. Multilobular Pneumonia (w/ hemoptysis): much better. BCx negative, IV abx D#4.  ESRD: Usually TTS schedule via AVF. Will ask IR to remove TDC prior to dc today.  2. Hypertension/volume: BP controlled, still over edw, extra tx today.  3. Anemia of CKD: Hgb 11.6. No ESA for now. 4. Metabolic bone disease: Ca slightly low, Phos high. Continue Phoslo/Hectoral for now. 5. Type 1 DM: on insulin, per primary. Sugar 300 this AM 6. Dispo - per primary, stable for d/c from renal standpoint       Kelly Splinter MD [employer] pgr [phone]   05/16/2017, 9:48 AM

## 2017-05-16 NOTE — Care Management Note (Signed)
Case Management Note  Patient Details  Name: BRAELEY BUSKEY MRN: [id number] Date of Birth: [birth year]  Subjective/Objective:    CM following for progression and d/c planning.                 Action/Plan: 05/16/2017 No D/c needs identified, pt for d/c to home, will resume HD at her current HD Center.   Expected Discharge Date:  05/16/17               Expected Discharge Plan:  Home/Self Care  In-House Referral:  NA  Discharge planning Services  NA  Post Acute Care Choice:  NA Choice offered to:  NA  DME Arranged:   NA DME Agency:  NA  HH Arranged:  NA HH Agency:  NA  Status of Service:  Completed, signed off  If discussed at [place] of Stay Meetings, dates discussed:    Additional Comments:  Adron Bene, RN 05/16/2017, 3:39 PM

## 2017-05-16 NOTE — Progress Notes (Signed)
  Attempted removal of tunneled HD catheter today.  The procedure was aborted due to excessive bleeding.  Suture was required for hemostasis.  Will have patient return as outpatient for removal.  Dr. Jonnie Finner aware.  Abigail Butts S Felipa Laroche PA-C 05/16/2017 3:31 PM

## 2017-05-19 ENCOUNTER — Other Ambulatory Visit (HOSPITAL_COMMUNITY): Payer: Self-pay | Admitting: Nephrology

## 2017-05-19 DIAGNOSIS — N186 End stage renal disease: Secondary | ICD-10-CM

## 2017-05-19 LAB — CULTURE, BLOOD (ROUTINE X 2)
CULTURE: NO GROWTH
CULTURE: NO GROWTH
SPECIAL REQUESTS: ADEQUATE

## 2017-05-23 ENCOUNTER — Telehealth (HOSPITAL_COMMUNITY): Payer: Self-pay

## 2017-05-23 NOTE — Telephone Encounter (Signed)
Called to schedule catheter removal, no answer. Left vm. AW

## 2017-06-07 ENCOUNTER — Telehealth (HOSPITAL_COMMUNITY): Payer: Self-pay

## 2017-06-07 NOTE — Telephone Encounter (Signed)
Called to see if pt still needed catheter removal. Called pt on 12/3 with no return call. Left message for pt to call back. AW

## 2017-06-16 DIAGNOSIS — R197 Diarrhea, unspecified: Secondary | ICD-10-CM | POA: Insufficient documentation

## 2017-06-30 DIAGNOSIS — A049 Bacterial intestinal infection, unspecified: Secondary | ICD-10-CM | POA: Insufficient documentation

## 2017-09-15 DIAGNOSIS — Z4802 Encounter for removal of sutures: Secondary | ICD-10-CM | POA: Insufficient documentation

## 2018-02-03 DIAGNOSIS — Z992 Dependence on renal dialysis: Secondary | ICD-10-CM | POA: Insufficient documentation

## 2018-02-03 HISTORY — DX: Dependence on renal dialysis: Z99.2

## 2018-03-21 ENCOUNTER — Encounter (HOSPITAL_COMMUNITY): Payer: Self-pay | Admitting: Emergency Medicine

## 2018-03-21 ENCOUNTER — Other Ambulatory Visit: Payer: Self-pay

## 2018-03-21 ENCOUNTER — Observation Stay (HOSPITAL_COMMUNITY)
Admission: EM | Admit: 2018-03-21 | Discharge: 2018-03-22 | Disposition: A | Discharge status: Home / Self Care | Payer: Medicaid Other | Attending: Internal Medicine | Admitting: Internal Medicine

## 2018-03-21 DIAGNOSIS — E1065 Type 1 diabetes mellitus with hyperglycemia: Secondary | ICD-10-CM

## 2018-03-21 DIAGNOSIS — I120 Hypertensive chronic kidney disease with stage 5 chronic kidney disease or end stage renal disease: Secondary | ICD-10-CM | POA: Diagnosis not present

## 2018-03-21 DIAGNOSIS — E1029 Type 1 diabetes mellitus with other diabetic kidney complication: Secondary | ICD-10-CM | POA: Diagnosis present

## 2018-03-21 DIAGNOSIS — D649 Anemia, unspecified: Secondary | ICD-10-CM | POA: Diagnosis present

## 2018-03-21 DIAGNOSIS — E1010 Type 1 diabetes mellitus with ketoacidosis without coma: Secondary | ICD-10-CM | POA: Diagnosis not present

## 2018-03-21 DIAGNOSIS — E1022 Type 1 diabetes mellitus with diabetic chronic kidney disease: Secondary | ICD-10-CM | POA: Insufficient documentation

## 2018-03-21 DIAGNOSIS — Z794 Long term (current) use of insulin: Secondary | ICD-10-CM | POA: Insufficient documentation

## 2018-03-21 DIAGNOSIS — F329 Major depressive disorder, single episode, unspecified: Secondary | ICD-10-CM | POA: Diagnosis not present

## 2018-03-21 DIAGNOSIS — Z992 Dependence on renal dialysis: Secondary | ICD-10-CM | POA: Diagnosis not present

## 2018-03-21 DIAGNOSIS — N186 End stage renal disease: Secondary | ICD-10-CM | POA: Diagnosis not present

## 2018-03-21 DIAGNOSIS — R42 Dizziness and giddiness: Secondary | ICD-10-CM | POA: Diagnosis present

## 2018-03-21 DIAGNOSIS — Z79899 Other long term (current) drug therapy: Secondary | ICD-10-CM | POA: Insufficient documentation

## 2018-03-21 DIAGNOSIS — Z8673 Personal history of transient ischemic attack (TIA), and cerebral infarction without residual deficits: Secondary | ICD-10-CM | POA: Diagnosis not present

## 2018-03-21 DIAGNOSIS — IMO0002 Reserved for concepts with insufficient information to code with codable children: Secondary | ICD-10-CM | POA: Diagnosis present

## 2018-03-21 DIAGNOSIS — E1110 Type 2 diabetes mellitus with ketoacidosis without coma: Secondary | ICD-10-CM | POA: Diagnosis present

## 2018-03-21 HISTORY — DX: Cerebral infarction, unspecified: I63.9

## 2018-03-21 LAB — I-STAT BETA HCG BLOOD, ED (MC, WL, AP ONLY): I-stat hCG, quantitative: <5 m[IU]/mL (ref <5)

## 2018-03-21 LAB — COMPREHENSIVE METABOLIC PANEL
ALT: 19 U/L (ref 0–44)
ANION GAP: 18 — AB (ref 5–15)
AST: 23 U/L (ref 15–41)
Albumin: 4.1 g/dL (ref 3.5–5.0)
Alkaline Phosphatase: 116 U/L (ref 38–126)
BUN: 22 mg/dL — AB (ref 6–20)
CALCIUM: 7.9 mg/dL — AB (ref 8.9–10.3)
CO2: 24 mmol/L (ref 22–32)
CREATININE: 6.89 mg/dL — AB (ref 0.44–1.00)
Chloride: 91 mmol/L — ABNORMAL LOW (ref 98–111)
GFR calc Af Amer: 9 mL/min — ABNORMAL LOW (ref >60)
GFR, EST NON AFRICAN AMERICAN: 7 mL/min — AB (ref >60)
GLUCOSE: 520 mg/dL — AB (ref 70–99)
POTASSIUM: 5.4 mmol/L — AB (ref 3.5–5.1)
Sodium: 133 mmol/L — ABNORMAL LOW (ref 135–145)
TOTAL PROTEIN: 8.3 g/dL — AB (ref 6.5–8.1)
Total Bilirubin: 0.3 mg/dL (ref 0.3–1.2)

## 2018-03-21 LAB — CBG MONITORING, ED
GLUCOSE-CAPILLARY: 523 mg/dL — AB (ref 70–99)
GLUCOSE-CAPILLARY: 460 mg/dL — AB (ref 70–99)
Glucose-Capillary: 344 mg/dL — ABNORMAL HIGH (ref 70–99)

## 2018-03-21 LAB — URINALYSIS, ROUTINE W REFLEX MICROSCOPIC
BILIRUBIN URINE: NEGATIVE
Glucose, UA: >=500 mg/dL — AB
KETONES UR: NEGATIVE mg/dL
Nitrite: NEGATIVE
Protein, ur: >=300 mg/dL — AB
SPECIFIC GRAVITY, URINE: 1.014 (ref 1.005–1.030)
WBC, UA: >50 WBC/hpf — ABNORMAL HIGH (ref 0–5)
pH: 8 (ref 5.0–8.0)

## 2018-03-21 LAB — CBC
HEMATOCRIT: 40.5 % (ref 36.0–46.0)
Hemoglobin: 13 g/dL (ref 12.0–15.0)
MCH: 31 pg (ref 26.0–34.0)
MCHC: 32.1 g/dL (ref 30.0–36.0)
MCV: 96.4 fL (ref 78.0–100.0)
PLATELETS: 246 10*3/uL (ref 150–400)
RBC: 4.2 MIL/uL (ref 3.87–5.11)
RDW: 14.2 % (ref 11.5–15.5)
WBC: 5.3 10*3/uL (ref 4.0–10.5)

## 2018-03-21 LAB — LIPASE, BLOOD: LIPASE: 30 U/L (ref 11–51)

## 2018-03-21 MED ORDER — SODIUM CHLORIDE 0.9 % IV BOLUS: 1000.0000 mL | Freq: Once | INTRAVENOUS | Status: DC

## 2018-03-21 MED ORDER — DEXTROSE-NACL 5-0.45 % IV SOLN
INTRAVENOUS | Status: DC
  Administered 2018-03-21 – 2018-03-22 (×2): via INTRAVENOUS

## 2018-03-21 MED ORDER — SODIUM CHLORIDE 0.9 % IV SOLN
INTRAVENOUS | Status: DC
  Administered 2018-03-21: 2.8 [IU]/h via INTRAVENOUS
  Filled 2018-03-21: qty 1

## 2018-03-21 MED ORDER — SODIUM CHLORIDE 0.9 % IV BOLUS: 500.0000 mL | Freq: Once | INTRAVENOUS | Status: DC

## 2018-03-21 NOTE — ED Provider Notes (Addendum)
[facility] DEPT Provider Note   CSN: [id number] Arrival date & time: 03/21/18  1855     History   Chief Complaint Chief Complaint  Patient presents with  . Dizziness    HPI [name] is a 27 y.o. female with a history of ESRD on HD (T/R/S) 2/2 DM Type I, CVA, and cardiac arrest who presents to the emergency department from home with a chief complaint of dizziness.  The patient endorses intermittent doses of dizziness that began after she was dialyzed this afternoon.  She states that the episodes last for approximately 30 minutes and she describes them as "feeling like my head is spinning, but the room is not moving".  The patient states that she has had multiple episodes since onset.  She states that walking will intermittently bring on the episodes, but no known alleviating factors.  Last episode of dizziness was at 9 PM.  She also reports that her body has been aching diffusely since dialysis.  She reports that she has had similar episodes of dizziness and body aching after dialysis before, but not this severe. she denies fever, chills, headache, tinnitus, visual changes, nausea, vomiting, abdominal pain, rash, neck pain or stiffness, cough, sinus pain or pressure, otalgia, URI symptoms, diarrhea, or constipation.  No treatment prior to arrival.  She reports that her blood sugar at home runs between 60 and 300.  She still makes a small amount of urine.  She reports that she was last dialyzed today and had a full session of dialysis.  No known sick contacts.  She reports that earlier this afternoon that she ate chicken nuggets and [place] fries.  Reports that she gave herself 5 units of insulin around 9 PM.  She has not yet taken her nighttime dose of Lantus.  The history is provided by the patient. No language interpreter was used.    Past Medical History:  Diagnosis Date  . Cardiac arrest ([place])   . Complication of anesthesia   . Diabetes  mellitus type 1 ([place])    2005 dx [facility]  . DKA (diabetic ketoacidoses) ([place])   . ESRD (end stage renal disease) ([facility])    TTS HD, dialyzed at [facility]  . Hypertension   . PONV (postoperative nausea and vomiting)   . Stroke [facility])     Patient Active Problem List   Diagnosis Date Noted  . Dizziness 03/22/2018  . Multifocal pneumonia 05/14/2017  . Normocytic normochromic anemia 01/26/2017  . ESRD (end stage renal disease) ([place]) 01/26/2017  . Fluid overload 01/26/2017  . Depression 07/04/2014  . Hemodialysis-associated hypotension 04/24/2014  . Cerebral infarction due to thrombosis of right middle cerebral artery ([place]) 04/24/2014  . History of anemia 04/17/2014  . Cardiac arrest ([place]) 04/14/2014  . Diabetic neuropathy ([place]) 02/14/2014  . DKA, type 1 ([place]) 02/12/2014  . Tachycardia 02/12/2014  . DOE (dyspnea on exertion) 02/12/2014  . Abdominal pain 02/12/2014  . IDDM (insulin dependent diabetes mellitus) ([place]) 02/12/2014  . Type I diabetes mellitus with renal manifestations, uncontrolled ([place]) 12/08/2013  . Hypokalemia 12/07/2013  . Noncompliance 12/06/2013  . DKA (diabetic ketoacidoses) ([place]) 12/05/2013  . Leukocytosis 12/05/2013  . Acute on chronic renal failure ([place]) 12/05/2013  . Hypercalcemia 12/05/2013  . Abnormal LFTs 12/05/2013  . Elevated lipase 12/05/2013    Past Surgical History:  Procedure Laterality Date  . CATARACT EXTRACTION     6 years ago  . DIALYSIS FISTULA CREATION    . IR FLUORO  GUIDE CV LINE RIGHT  01/26/2017  . IR REMOVAL TUN CV CATH W/O FL  05/16/2017  . IR US GUIDE VASC ACCESS RIGHT  01/26/2017     OB History   None      Home Medications    Prior to Admission medications   Medication Sig Start Date End Date Taking? Authorizing Provider  calcium acetate (PHOSLO) 667 MG capsule Take 3 capsules (2,001 mg total) by mouth 3 (three) times daily with meals. 05/16/17  Yes Rai, Ripudeep K, MD  insulin aspart (NOVOLOG) 100 UNIT/ML injection  Inject 0-9 Units into the skin 3 (three) times daily with meals. 01/29/17  Yes Georgette Shell, MD  metoprolol succinate (TOPROL-XL) 25 MG 24 hr tablet Take 1 tablet (25 mg total) by mouth daily. 01/29/17  Yes Georgette Shell, MD  multivitamin (RENA-VIT) TABS tablet Take 1 tablet by mouth at bedtime. 01/29/17  Yes Georgette Shell, MD  norethindrone (MICRONOR,CAMILA,ERRIN) 0.35 MG tablet Take 1 tablet (0.35 mg total) by mouth daily. 01/29/17  Yes Georgette Shell, MD  SENSIPAR 60 MG tablet Take 60 mg by mouth daily. 03/16/18  Yes [provider]  amLODipine (NORVASC) 5 MG tablet Take 1 tablet (5 mg total) by mouth daily. 03/22/18   Ghimire, Henreitta Leber, MD  insulin glargine (LANTUS) 100 UNIT/ML injection Inject 0.22 mLs (22 Units total) into the skin at bedtime. 03/22/18   Ghimire, Henreitta Leber, MD    Family History Family History  Problem Relation Age of Onset  . Diabetes Maternal Grandmother   . Stroke Maternal Grandmother   . Cancer Maternal Aunt        aunt, passed last month, ?RCC  . Cancer Maternal Uncle     Social History Social History   Tobacco Use  . Smoking status: Never Smoker  . Smokeless tobacco: Never Used  Substance Use Topics  . Alcohol use: No    Alcohol/week: 0.0 standard drinks  . Drug use: No     Allergies   Patient has no known allergies.   Review of Systems Review of Systems  Constitutional: Negative for activity change, chills and fever.  HENT: Negative for congestion, ear pain, sinus pressure, sneezing and tinnitus.   Eyes: Negative for visual disturbance.  Respiratory: Negative for cough and shortness of breath.   Cardiovascular: Negative for chest pain.  Gastrointestinal: Negative for abdominal pain, diarrhea, nausea and vomiting.  Genitourinary: Negative for dysuria.  Musculoskeletal: Positive for myalgias. Negative for back pain.  Skin: Negative for rash.  Allergic/Immunologic: Negative for immunocompromised state.    Neurological: Positive for dizziness. Negative for seizures, syncope, weakness, light-headedness, numbness and headaches.  Psychiatric/Behavioral: Negative for confusion.     Physical Exam Updated Vital Signs BP 113/82   Pulse 88   Temp 98.5 F (36.9 C) (Oral)   Resp 16   Ht 5\' 1"  (1.549 m)   Wt 56.7 kg   LMP 03/21/2018   SpO2 100%   BMI 23.62 kg/m   Physical Exam  Constitutional: She is oriented to person, place, and time. No distress.  HENT:  Head: Normocephalic.  Eyes: Pupils are equal, round, and reactive to light. Conjunctivae and EOM are normal.  Neck: Neck supple.  Cardiovascular: Normal rate, regular rhythm and intact distal pulses. Exam reveals no gallop and no friction rub.  No murmur heard. Pulmonary/Chest: Effort normal. No stridor. No respiratory distress. She has no wheezes. She has no rales. She exhibits no tenderness.  Abdominal: Soft. She exhibits no distension and  no mass. There is no tenderness. There is no rebound and no guarding. No hernia.  Neurological: She is alert and oriented to person, place, and time.  GCS 15.  Moves all 4 extremities.  Alert and oriented x4.  Cranial nerves II through XII are grossly intact.  Normal finger-to-nose and heel-to-shin bilaterally.  Normal rapid alternating movements.  5 out of 5 strength against resistance of the bilateral upper and lower extremities.  Speaks in complete, fluent sentences.  Skin: Skin is warm. No rash noted.  Psychiatric: Her behavior is normal.  Nursing note and vitals reviewed.  ED Treatments / Results  Labs (all labs ordered are listed, but only abnormal results are displayed) Labs Reviewed  COMPREHENSIVE METABOLIC PANEL - Abnormal; Notable for the following components:      Result Value   Sodium 133 (*)    Potassium 5.4 (*)    Chloride 91 (*)    Glucose, Bld 520 (*)    BUN 22 (*)    Creatinine, Ser 6.89 (*)    Calcium 7.9 (*)    Total Protein 8.3 (*)    GFR calc non Af Amer 7 (*)    GFR  calc Af Amer 9 (*)    Anion gap 18 (*)    All other components within normal limits  URINALYSIS, ROUTINE W REFLEX MICROSCOPIC - Abnormal; Notable for the following components:   APPearance TURBID (*)    Glucose, UA >=500 (*)    Hgb urine dipstick MODERATE (*)    Protein, ur >=300 (*)    Leukocytes, UA LARGE (*)    RBC / HPF >50 (*)    WBC, UA >50 (*)    Bacteria, UA FEW (*)    Squamous Epithelial / LPF >50 (*)    Non Squamous Epithelial 11-20 (*)    All other components within normal limits  GLUCOSE, CAPILLARY - Abnormal; Notable for the following components:   Glucose-Capillary 106 (*)    All other components within normal limits  BASIC METABOLIC PANEL - Abnormal; Notable for the following components:   Chloride 97 (*)    Glucose, Bld 65 (*)    BUN 25 (*)    Creatinine, Ser 7.79 (*)    Calcium 8.4 (*)    GFR calc non Af Amer 6 (*)    GFR calc Af Amer 7 (*)    All other components within normal limits  BASIC METABOLIC PANEL - Abnormal; Notable for the following components:   Sodium 133 (*)    Chloride 94 (*)    Glucose, Bld 299 (*)    BUN 27 (*)    Creatinine, Ser 7.80 (*)    Calcium 7.9 (*)    GFR calc non Af Amer 6 (*)    GFR calc Af Amer 7 (*)    All other components within normal limits  BASIC METABOLIC PANEL - Abnormal; Notable for the following components:   BUN 31 (*)    Creatinine, Ser 8.74 (*)    Calcium 8.4 (*)    GFR calc non Af Amer 6 (*)    GFR calc Af Amer 6 (*)    All other components within normal limits  GLUCOSE, CAPILLARY - Abnormal; Notable for the following components:   Glucose-Capillary 57 (*)    All other components within normal limits  GLUCOSE, CAPILLARY - Abnormal; Notable for the following components:   Glucose-Capillary 141 (*)    All other components within normal limits  GLUCOSE, CAPILLARY - Abnormal;  Notable for the following components:   Glucose-Capillary 50 (*)    All other components within normal limits  GLUCOSE, CAPILLARY -  Abnormal; Notable for the following components:   Glucose-Capillary 143 (*)    All other components within normal limits  GLUCOSE, CAPILLARY - Abnormal; Notable for the following components:   Glucose-Capillary 250 (*)    All other components within normal limits  CBG MONITORING, ED - Abnormal; Notable for the following components:   Glucose-Capillary 523 (*)    All other components within normal limits  CBG MONITORING, ED - Abnormal; Notable for the following components:   Glucose-Capillary 460 (*)    All other components within normal limits  CBG MONITORING, ED - Abnormal; Notable for the following components:   Glucose-Capillary 344 (*)    All other components within normal limits  CBG MONITORING, ED - Abnormal; Notable for the following components:   Glucose-Capillary 300 (*)    All other components within normal limits  MRSA PCR SCREENING  LIPASE, BLOOD  CBC  CBC WITH DIFFERENTIAL/PLATELET  HCG, SERUM, QUALITATIVE  GLUCOSE, CAPILLARY  I-STAT BETA HCG BLOOD, ED (MC, WL, AP ONLY)    EKG EKG Interpretation  Date/Time:  Tuesday March 21 2018 23:10:45 EDT Ventricular Rate:  85 PR Interval:    QRS Duration: 78 QT Interval:  383 QTC Calculation: 456 R Axis:   79 Text Interpretation:  Sinus rhythm Normal ECG When compared with ECG of 11/236/2018, No significant change was found Confirmed by Delora Fuel ([id number]) on 03/22/2018 9:47:21 PM   Radiology No results found.  Procedures .Critical Care Performed by: Joanne Gavel, PA-C Authorized by: Joanne Gavel, PA-C   Critical care provider statement:    Critical care time (minutes):  45   Critical care was necessary to treat or prevent imminent or life-threatening deterioration of the following conditions:  Endocrine crisis   Critical care was time spent personally by me on the following activities:  Discussions with consultants, evaluation of patient's response to treatment, examination of patient, ordering and  performing treatments and interventions, ordering and review of laboratory studies, ordering and review of radiographic studies, pulse oximetry, re-evaluation of patient's condition, obtaining history from patient or surrogate and review of old charts   (including critical care time)  Medications Ordered in ED Medications  loperamide (IMODIUM) capsule 4 mg (4 mg Oral Given 03/22/18 0422)     Initial Impression / Assessment and Plan / ED Course  I have reviewed the triage vital signs and the nursing notes.  Pertinent labs & imaging results that were available during my care of the patient were reviewed by me and considered in my medical decision making (see chart for details).     27 year old female with a history of ESRD on HD (T/R/S) 2/2 DM Type I, CVA, and cardiac arrest who presents to the emergency department with a chief complaint of dizziness.  She has had approximately 30-minute episodes of dizziness intermittently since this afternoon after she completed a full session of dialysis.  Labs in the ED are notable for glucose of 520 with an anion gap of 18.  Bicarb is 24.  Creatinine is 6.89 and she is hypocalcemic at 7.9.  Her potassium is also noted to be elevated at 5.4 despite having just been dialyzed in the last 12 hours?  EKG with normal sinus rhythm and no peak T waves.  She denies chest pain shortness of breath at this time.  Given hyperglycemia with elevated anion gap,  suspect DKA.  I suspect that the patient's episodes of dizziness may be related to hyperglycemia.  She has no focal neurologic deficits on exam.  Doubt CVA.  Will treat hyperglycemia and plan to reassess for dizziness.  The patient was discussed with Dr. Dayna Barker, attending physician.  We will start the patient on glucosostabilizer with insulin in the ED.  Insulin will also treat hyperkalemia.  Consult to the hospitalist team and spoke with Dr. Hal Hope who will accept the patient for admission. The patient appears  reasonably stabilized for admission considering the current resources, flow, and capabilities available in the ED at this time, and I doubt any other [facility] requiring further screening and/or treatment in the ED prior to admission.  Final Clinical Impressions(s) / ED Diagnoses   Final diagnoses:  Diabetic ketoacidosis without coma associated with type 1 diabetes mellitus [facility])    ED Discharge Orders         Ordered    amLODipine (NORVASC) 5 MG tablet  Daily     03/22/18 1420    insulin glargine (LANTUS) 100 UNIT/ML injection  Daily at bedtime     03/22/18 1420    Increase activity slowly     03/22/18 1420    Diet - low sodium heart healthy     03/22/18 1420    Diet Carb Modified     03/22/18 1420    Call MD for:  persistant dizziness or light-headedness     03/22/18 1420           Frantz Quattrone, Laymond Purser, PA-C 03/22/18 0124    Mesner, Corene Cornea, MD 03/24/18 1808    Larsen Zettel A, PA-C 04/04/18 1528    Mesner, Corene Cornea, MD 04/05/18 0321

## 2018-03-21 NOTE — ED Triage Notes (Signed)
Pt from home with c/o dizziness following dialysis today. Pt did not mention it to dialysis staff as she said it was only mild initially but got worse after she left. Pt is on transplant list. Pt denies cold symptoms or recent illness. No fever or chills. Pt is not tachycardic. Pt states she feels swimmy headed rather than feels like the room is spinning. Pt is ambulatory.

## 2018-03-21 NOTE — ED Notes (Addendum)
Critical CBG 520 noted from pharmacy

## 2018-03-22 ENCOUNTER — Other Ambulatory Visit: Payer: Self-pay

## 2018-03-22 ENCOUNTER — Encounter (HOSPITAL_COMMUNITY): Payer: Self-pay | Admitting: Internal Medicine

## 2018-03-22 DIAGNOSIS — I120 Hypertensive chronic kidney disease with stage 5 chronic kidney disease or end stage renal disease: Secondary | ICD-10-CM | POA: Diagnosis not present

## 2018-03-22 DIAGNOSIS — E1022 Type 1 diabetes mellitus with diabetic chronic kidney disease: Secondary | ICD-10-CM | POA: Diagnosis not present

## 2018-03-22 DIAGNOSIS — R42 Dizziness and giddiness: Secondary | ICD-10-CM | POA: Diagnosis not present

## 2018-03-22 DIAGNOSIS — N186 End stage renal disease: Secondary | ICD-10-CM

## 2018-03-22 DIAGNOSIS — E1010 Type 1 diabetes mellitus with ketoacidosis without coma: Secondary | ICD-10-CM

## 2018-03-22 DIAGNOSIS — Z8673 Personal history of transient ischemic attack (TIA), and cerebral infarction without residual deficits: Secondary | ICD-10-CM | POA: Diagnosis not present

## 2018-03-22 DIAGNOSIS — Z794 Long term (current) use of insulin: Secondary | ICD-10-CM | POA: Diagnosis not present

## 2018-03-22 DIAGNOSIS — F329 Major depressive disorder, single episode, unspecified: Secondary | ICD-10-CM | POA: Diagnosis not present

## 2018-03-22 DIAGNOSIS — Z79899 Other long term (current) drug therapy: Secondary | ICD-10-CM | POA: Diagnosis not present

## 2018-03-22 DIAGNOSIS — Z992 Dependence on renal dialysis: Secondary | ICD-10-CM | POA: Diagnosis not present

## 2018-03-22 LAB — BASIC METABOLIC PANEL
ANION GAP: 12 (ref 5–15)
Anion gap: 12 (ref 5–15)
Anion gap: 13 (ref 5–15)
BUN: 25 mg/dL — AB (ref 6–20)
BUN: 27 mg/dL — AB (ref 6–20)
BUN: 31 mg/dL — AB (ref 6–20)
CALCIUM: 8.4 mg/dL — AB (ref 8.9–10.3)
CALCIUM: 7.9 mg/dL — AB (ref 8.9–10.3)
CO2: 28 mmol/L (ref 22–32)
CO2: 26 mmol/L (ref 22–32)
CO2: 27 mmol/L (ref 22–32)
Calcium: 8.4 mg/dL — ABNORMAL LOW (ref 8.9–10.3)
Chloride: 97 mmol/L — ABNORMAL LOW (ref 98–111)
Chloride: 94 mmol/L — ABNORMAL LOW (ref 98–111)
Chloride: 98 mmol/L (ref 98–111)
Creatinine, Ser: 7.79 mg/dL — ABNORMAL HIGH (ref 0.44–1.00)
Creatinine, Ser: 7.8 mg/dL — ABNORMAL HIGH (ref 0.44–1.00)
Creatinine, Ser: 8.74 mg/dL — ABNORMAL HIGH (ref 0.44–1.00)
GFR calc Af Amer: 7 mL/min — ABNORMAL LOW (ref >60)
GFR calc Af Amer: 7 mL/min — ABNORMAL LOW (ref >60)
GFR calc Af Amer: 6 mL/min — ABNORMAL LOW (ref >60)
GFR, EST NON AFRICAN AMERICAN: 6 mL/min — AB (ref >60)
GFR, EST NON AFRICAN AMERICAN: 6 mL/min — AB (ref >60)
GFR, EST NON AFRICAN AMERICAN: 6 mL/min — AB (ref >60)
GLUCOSE: 65 mg/dL — AB (ref 70–99)
GLUCOSE: 299 mg/dL — AB (ref 70–99)
Glucose, Bld: 82 mg/dL (ref 70–99)
POTASSIUM: 4.8 mmol/L (ref 3.5–5.1)
Potassium: 4.1 mmol/L (ref 3.5–5.1)
Potassium: 5 mmol/L (ref 3.5–5.1)
Sodium: 137 mmol/L (ref 135–145)
Sodium: 133 mmol/L — ABNORMAL LOW (ref 135–145)
Sodium: 137 mmol/L (ref 135–145)

## 2018-03-22 LAB — GLUCOSE, CAPILLARY
GLUCOSE-CAPILLARY: 141 mg/dL — AB (ref 70–99)
Glucose-Capillary: 106 mg/dL — ABNORMAL HIGH (ref 70–99)
Glucose-Capillary: 57 mg/dL — ABNORMAL LOW (ref 70–99)
Glucose-Capillary: 143 mg/dL — ABNORMAL HIGH (ref 70–99)
Glucose-Capillary: 50 mg/dL — ABNORMAL LOW (ref 70–99)
Glucose-Capillary: 250 mg/dL — ABNORMAL HIGH (ref 70–99)
Glucose-Capillary: 92 mg/dL (ref 70–99)

## 2018-03-22 LAB — CBC WITH DIFFERENTIAL/PLATELET
ABS IMMATURE GRANULOCYTES: 0 10*3/uL (ref 0.0–0.1)
BASOS ABS: 0 10*3/uL (ref 0.0–0.1)
Basophils Relative: 1 %
Eosinophils Absolute: 0.1 10*3/uL (ref 0.0–0.7)
Eosinophils Relative: 1 %
HCT: 38.7 % (ref 36.0–46.0)
HEMOGLOBIN: 12.1 g/dL (ref 12.0–15.0)
Immature Granulocytes: 0 %
LYMPHS PCT: 34 %
Lymphs Abs: 2.2 10*3/uL (ref 0.7–4.0)
MCH: 30.1 pg (ref 26.0–34.0)
MCHC: 31.3 g/dL (ref 30.0–36.0)
MCV: 96.3 fL (ref 78.0–100.0)
Monocytes Absolute: 0.7 10*3/uL (ref 0.1–1.0)
Monocytes Relative: 11 %
NEUTROS ABS: 3.4 10*3/uL (ref 1.7–7.7)
Neutrophils Relative %: 53 %
Platelets: 241 10*3/uL (ref 150–400)
RBC: 4.02 MIL/uL (ref 3.87–5.11)
RDW: 13.5 % (ref 11.5–15.5)
WBC: 6.3 10*3/uL (ref 4.0–10.5)

## 2018-03-22 LAB — HCG, SERUM, QUALITATIVE: PREG SERUM: NEGATIVE

## 2018-03-22 LAB — MRSA PCR SCREENING: MRSA by PCR: NEGATIVE

## 2018-03-22 LAB — CBG MONITORING, ED: Glucose-Capillary: 300 mg/dL — ABNORMAL HIGH (ref 70–99)

## 2018-03-22 MED ORDER — INSULIN ASPART 100 UNIT/ML ~~LOC~~ SOLN
3.0000 [IU] | Freq: Three times a day (TID) | SUBCUTANEOUS | Status: DC
  Administered 2018-03-22: 3 [IU] via SUBCUTANEOUS

## 2018-03-22 MED ORDER — HEPARIN SODIUM (PORCINE) 5000 UNIT/ML IJ SOLN
5000.0000 [IU] | Freq: Three times a day (TID) | INTRAMUSCULAR | Status: DC
  Administered 2018-03-22: 5000 [IU] via SUBCUTANEOUS
  Filled 2018-03-22: qty 1

## 2018-03-22 MED ORDER — AMLODIPINE BESYLATE 5 MG PO TABS: 5.0000 mg | ORAL_TABLET | Freq: Every day | ORAL | Status: DC

## 2018-03-22 MED ORDER — SODIUM CHLORIDE 0.9 % IV SOLN
INTRAVENOUS | Status: DC
  Filled 2018-03-22: qty 1

## 2018-03-22 MED ORDER — SODIUM CHLORIDE 0.9 % IV SOLN: INTRAVENOUS | Status: DC

## 2018-03-22 MED ORDER — INSULIN GLARGINE 100 UNIT/ML ~~LOC~~ SOLN: 22.0000 [IU] | Freq: Every day | SUBCUTANEOUS | 0 refills | Status: AC

## 2018-03-22 MED ORDER — RENA-VITE PO TABS: 1.0000 | ORAL_TABLET | Freq: Every day | ORAL | Status: DC

## 2018-03-22 MED ORDER — DEXTROSE-NACL 5-0.45 % IV SOLN: INTRAVENOUS | Status: DC

## 2018-03-22 MED ORDER — INSULIN GLARGINE 100 UNIT/ML ~~LOC~~ SOLN
22.0000 [IU] | Freq: Every day | SUBCUTANEOUS | Status: DC
  Administered 2018-03-22: 22 [IU] via SUBCUTANEOUS
  Filled 2018-03-22: qty 0.22

## 2018-03-22 MED ORDER — INSULIN ASPART 100 UNIT/ML ~~LOC~~ SOLN
0.0000 [IU] | SUBCUTANEOUS | Status: DC
  Administered 2018-03-22: 1 [IU] via SUBCUTANEOUS
  Administered 2018-03-22: 3 [IU] via SUBCUTANEOUS

## 2018-03-22 MED ORDER — CINACALCET HCL 30 MG PO TABS: 60.0000 mg | ORAL_TABLET | Freq: Every day | ORAL | Status: DC

## 2018-03-22 MED ORDER — CALCIUM ACETATE (PHOS BINDER) 667 MG PO CAPS
2001.0000 mg | ORAL_CAPSULE | Freq: Three times a day (TID) | ORAL | Status: DC
  Administered 2018-03-22 (×2): 2001 mg via ORAL
  Filled 2018-03-22 (×2): qty 3

## 2018-03-22 MED ORDER — AMLODIPINE BESYLATE 5 MG PO TABS: 5.0000 mg | ORAL_TABLET | Freq: Every day | ORAL | 0 refills | Status: DC

## 2018-03-22 MED ORDER — METOPROLOL SUCCINATE ER 25 MG PO TB24
25.0000 mg | ORAL_TABLET | Freq: Every day | ORAL | Status: DC
  Administered 2018-03-22: 25 mg via ORAL
  Filled 2018-03-22: qty 1

## 2018-03-22 MED ORDER — LOPERAMIDE HCL 2 MG PO CAPS
4.0000 mg | ORAL_CAPSULE | Freq: Once | ORAL | Status: AC
  Administered 2018-03-22: 4 mg via ORAL
  Filled 2018-03-22: qty 2

## 2018-03-22 NOTE — ED Notes (Signed)
Report called to [facility] 5W

## 2018-03-22 NOTE — Progress Notes (Signed)
   03/22/18 1032  Clinical Encounter Type  Visited With Patient  Visit Type Initial  Referral From Nurse  Spiritual Encounters  Spiritual Needs Other (Comment)  Stress Factors  Patient Stress Factors None identified   Responded to Spiritual consult. PT was alert and talking. PT stated she knew what she needed to do for the AD. She also stated that she was looking forward to going home today. I offered Spiritual care with [facility] and words of encouragement. Chaplain available as needed.    Chaplain Fidel Levy

## 2018-03-22 NOTE — Progress Notes (Signed)
Inpatient Diabetes Program Recommendations  AACE/ADA: New Consensus Statement on Inpatient Glycemic Control (2015)  Target Ranges:  Prepandial:   less than 140 mg/dL      Peak postprandial:   less than 180 mg/dL (1-2 hours)      Critically ill patients:  140 - 180 mg/dL   Lab Results  Component Value Date   GLUCAP 141 (H) 03/22/2018   HGBA1C 9.1 (H) 05/14/2017    Review of Glycemic ControlResults for ZAYAH, KEILMAN (MRN [id number]) as of 03/22/2018 10:31  Ref. Range 03/22/2018 02:19 03/22/2018 02:43 03/22/2018 03:14 03/22/2018 03:56 03/22/2018 08:20  Glucose-Capillary Latest Ref Range: 70 - 99 mg/dL 57 (L) 50 (L) 143 (H) 250 (H) 141 (H)    Diabetes history: Type 1 DM/ESRD Outpatient Diabetes medications: Novolog 0-9 units tid with meals (per patient she usually takes approximately 4 units of Novolog with meals, Lantus 20 units q HS Current orders for Inpatient glycemic control:  Lantus 22 units daily, Novolog sensitive q 4 hours  Inpatient Diabetes Program Recommendations:    Discussed patient in progression rounds.  Orders received for Novolog 3 units tid with meals.  Spoke with patient regarding home DM control. She see's MD at [facility] and has appointment in November. She is in the process of seeking Renal/pancreas transplant as well.  We discussed potential causes of DKA, and she does not remember missing insulin doses but states that she was feeling bad after dialysis?  She has supplies for insulin and checking CBG's.  Last A1C was 9.1% which is a huge improvement from past A1C's. She is currently in school at [employer].  She does not have any questions at this time and plans to f/u with her endocrinologist.   Thanks,  Adah Perl, RN, BC-ADM Inpatient Diabetes Coordinator Pager [phone] (8a-5p)

## 2018-03-22 NOTE — Progress Notes (Signed)
BMP results available in Epic. Pt c/o diarrhea no abdominal pain nor fever. MD notified

## 2018-03-22 NOTE — ED Notes (Signed)
Report given to [facility] 

## 2018-03-22 NOTE — Progress Notes (Signed)
Pt on glucose stabilizer on admission, CBG 106 on arrival. Pt reported feeling shaky, CBG 57. Hal Hope, MD notified. Verbal order given to discontinue insuline drip, and fluids, and pt placed on diet. Hypoglycemia treated with 15 g oral snack, fallowing CBG 50. Food given to pt, and CBG on recheck 143, pt reported improvement in her condition. Entire time pt remained alert and oriented no signs of distress.  MD aware of above stated.

## 2018-03-22 NOTE — Progress Notes (Signed)
Pt arrived to [place] from [place], no orders. Admission notified.

## 2018-03-22 NOTE — H&P (Signed)
History and Physical    [name] TDV:[id number] DOB: April 07, 1991 DOA: 03/21/2018  PCP: Patient, No Pcp Per  Patient coming from: Home.  Chief Complaint: Dizziness.  HPI: [name] is a 27 y.o. female with history of diabetes mellitus type 1 on Lantus insulin, history of stroke, ESRD on hemodialysis on Tuesday Thursdays and Saturday presents to the ER at [facility] with complaints of dizziness.  Patient states she started feeling dizzy after dialysis.  Dizziness is mostly on trying to walk and stand up.  Denies any headache weakness of the upper or lower extremities or any visual changes or difficulty speaking or any facial asymmetry.  Denies any nausea vomiting diarrhea chest pain or shortness of breath.  ED Course: The ER patient appeared nonfocal EKG shows normal sinus rhythm.  Blood sugar was 520 with an angle of 18.  Was started on insulin drip.  Admitted for possible early DKA and dizziness.  Review of Systems: As per HPI, rest all negative.   Past Medical History:  Diagnosis Date  . Cardiac arrest ([place])   . Complication of anesthesia   . Diabetes mellitus type 1 ([place])    2005 [facility]  . DKA (diabetic ketoacidoses) ([place])   . ESRD (end stage renal disease) (HCC)    TTS HD, dialyzed at [facility]  . Hypertension   . PONV (postoperative nausea and vomiting)   . Stroke [facility])     Past Surgical History:  Procedure Laterality Date  . CATARACT EXTRACTION     6 years ago  . DIALYSIS FISTULA CREATION    . IR FLUORO GUIDE CV LINE RIGHT  01/26/2017  . IR REMOVAL TUN CV CATH W/O FL  05/16/2017  . IR US GUIDE VASC ACCESS RIGHT  01/26/2017     reports that she has never smoked. She has never used smokeless tobacco. She reports that she does not drink alcohol or use drugs.  No Known Allergies  Family History  Problem Relation Age of Onset  . Diabetes Maternal Grandmother   . Stroke Maternal Grandmother   . Cancer Maternal Aunt    aunt, passed last month, ?RCC  . Cancer Maternal Uncle     Prior to Admission medications   Medication Sig Start Date End Date Taking? Authorizing Provider  amLODipine (NORVASC) 2.5 MG tablet Take 7.5 mg by mouth daily.    Yes [provider]  calcium acetate (PHOSLO) 667 MG capsule Take 3 capsules (2,001 mg total) by mouth 3 (three) times daily with meals. 05/16/17  Yes Rai, Ripudeep K, MD  insulin aspart (NOVOLOG) 100 UNIT/ML injection Inject 0-9 Units into the skin 3 (three) times daily with meals. 01/29/17  Yes Georgette Shell, MD  insulin glargine (LANTUS) 100 UNIT/ML injection Inject 0.2 mLs (20 Units total) into the skin at bedtime. 05/16/17  Yes Rai, Ripudeep K, MD  metoprolol succinate (TOPROL-XL) 25 MG 24 hr tablet Take 1 tablet (25 mg total) by mouth daily. 01/29/17  Yes Georgette Shell, MD  multivitamin (RENA-VIT) TABS tablet Take 1 tablet by mouth at bedtime. 01/29/17  Yes Georgette Shell, MD  norethindrone (MICRONOR,CAMILA,ERRIN) 0.35 MG tablet Take 1 tablet (0.35 mg total) by mouth daily. 01/29/17  Yes Georgette Shell, MD  SENSIPAR 60 MG tablet Take 60 mg by mouth daily. 03/16/18  Yes [provider]    Physical Exam: Vitals:   03/21/18 1910 03/21/18 2231 03/21/18 2349  BP: (!) 144/86 106/74 104/64  Pulse:  86 85 84  Resp: 15 16 13   Temp: 99.3 F (37.4 C)    TempSrc: Oral    SpO2: 100% 100% 100%  Weight: 56.7 kg    Height: 5\' 1"  (1.549 m)        Constitutional: Moderately built and nourished. Vitals:   03/21/18 1910 03/21/18 2231 03/21/18 2349  BP: (!) 144/86 106/74 104/64  Pulse: 86 85 84  Resp: 15 16 13   Temp: 99.3 F (37.4 C)    TempSrc: Oral    SpO2: 100% 100% 100%  Weight: 56.7 kg    Height: 5\' 1"  (1.549 m)     Eyes: Anicteric no pallor. ENMT: No discharge from the ears eyes nose or mouth. Neck: No mass or.  No neck rigidity but no JVD appreciated. Respiratory: No rhonchi or crepitations. Cardiovascular: S1-S2 heard  no murmurs appreciated. Abdomen: Nontender bowel sounds present. Musculoskeletal: No edema.  No joint effusion. Skin: No rash. Neurologic: Alert awake oriented to time place and person.  Moves all extremities 5 x 5.  No nystagmus no facial asymmetry but pupils equal and reacting to light. Psychiatric: Appears normal per normal affect.   Labs on Admission: I have personally reviewed following labs and imaging studies  CBC: Recent Labs  Lab 03/21/18 2108  WBC 5.3  HGB 13.0  HCT 40.5  MCV 96.4  PLT 762   Basic Metabolic Panel: Recent Labs  Lab 03/21/18 2108  NA 133*  K 5.4*  CL 91*  CO2 24  GLUCOSE 520*  BUN 22*  CREATININE 6.89*  CALCIUM 7.9*   GFR: Estimated Creatinine Clearance: 9.3 mL/min (A) (by C-G formula based on SCr of 6.89 mg/dL (H)). Liver Function Tests: Recent Labs  Lab 03/21/18 2108  AST 23  ALT 19  ALKPHOS 116  BILITOT 0.3  PROT 8.3*  ALBUMIN 4.1   Recent Labs  Lab 03/21/18 2108  LIPASE 30   No results for input(s): AMMONIA in the last 168 hours. Coagulation Profile: No results for input(s): INR, PROTIME in the last 168 hours. Cardiac Enzymes: No results for input(s): CKTOTAL, CKMB, CKMBINDEX, TROPONINI in the last 168 hours. BNP (last 3 results) No results for input(s): PROBNP in the last 8760 hours. HbA1C: No results for input(s): HGBA1C in the last 72 hours. CBG: Recent Labs  Lab 03/21/18 2020 03/21/18 2202 03/21/18 2346 03/22/18 0043 03/22/18 0147  GLUCAP 523* 460* 344* 300* 106*   Lipid Profile: No results for input(s): CHOL, HDL, LDLCALC, TRIG, CHOLHDL, LDLDIRECT in the last 72 hours. Thyroid Function Tests: No results for input(s): TSH, T4TOTAL, FREET4, T3FREE, THYROIDAB in the last 72 hours. Anemia Panel: No results for input(s): VITAMINB12, FOLATE, FERRITIN, TIBC, IRON, RETICCTPCT in the last 72 hours. Urine analysis:    Component Value Date/Time   COLORURINE YELLOW 03/21/2018 2109   APPEARANCEUR TURBID (A)  03/21/2018 2109   LABSPEC 1.014 03/21/2018 2109   PHURINE 8.0 03/21/2018 2109   GLUCOSEU >=500 (A) 03/21/2018 2109   HGBUR MODERATE (A) 03/21/2018 2109   BILIRUBINUR NEGATIVE 03/21/2018 2109   KETONESUR NEGATIVE 03/21/2018 2109   PROTEINUR >=300 (A) 03/21/2018 2109   UROBILINOGEN 0.2 04/07/2014 1457   NITRITE NEGATIVE 03/21/2018 2109   LEUKOCYTESUR LARGE (A) 03/21/2018 2109   Sepsis Labs: @LABRCNTIP (procalcitonin:4,lacticidven:4) )No results found for this or any previous visit (from the past 240 hour(s)).   Radiological Exams on Admission: No results found.  EKG: Independently reviewed.  Normal sinus rhythm.  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 Possible early DKA type I -patient states she may have missed a dose of medication today.  Was started on insulin infusion.  Once anion gap is corrected we will change to subcu into insulin.  He is advised to be compliant with medication.  Hemoglobin A1c 9 months ago was 9.1. 2. Dizziness appears to be orthostatic.  Happened after dialysis.  Will check orthostatics in the morning.  Appears nonfocal.  No nystagmus on exam. 3. Hypertension on metoprolol and Norvasc.  Check orthostatics. 4. ESRD on hemodialysis on Tuesday Thursdays and Saturday.  Has had dialysis yesterday. 5. History of stroke and cardiac as per the chart.   DVT prophylaxis: Heparin. Code Status: Full code. Family Communication: Discussed with patient. Disposition Plan: Home. Consults called: None. Admission status: Observation.   Rise Patience MD [facility] Pager [phone].  If 7PM-7AM, please contact night-coverage [email or web address] Password [email or web address]  03/22/2018, 2:14 AM

## 2018-03-22 NOTE — ED Notes (Signed)
ED TO INPATIENT HANDOFF REPORT  Name/Age/Gender [name] 27 y.o. female  Code Status Code Status History    Date Active Date Inactive Code Status Order ID Comments User Context   05/14/2017 0154 05/16/2017 1930 Full Code [id number]  Karmen Bongo, MD Inpatient   01/26/2017 0533 01/29/2017 1906 Full Code [id number]  Etta Quill, DO ED   04/10/2014 1418 04/11/2014 0811 Full Code [id number]  Germain Osgood, PA-C Inpatient   04/07/2014 1608 04/10/2014 1418 Full Code [id number]  Wilhelmina Mcardle, MD ED   02/12/2014 1738 02/15/2014 1734 Full Code [id number]  Wilber Oliphant, MD Inpatient   12/05/2013 2342 12/09/2013 1821 Full Code [id number]  Robbie Lis, MD Inpatient   12/05/2013 2110 12/05/2013 2342 Full Code [id number]  Robbie Lis, MD ED      Home/SNF/Other Home  Chief Complaint light headed/body pain   Level of Care/Admitting Diagnosis ED Disposition    ED Disposition Condition [place]: [place] [100100]  Level of Care: Stepdown [14]  I expect the patient will be discharged within 24 hours: No (not a candidate for 5C-Observation unit)  Diagnosis: DKA, type 1 [facility]) [768088]  Admitting Physician: Rise Patience [phone]  Attending Physician: Rise Patience [email or web address]  PT Class (Do Not Modify): Observation [104]  PT Acc Code (Do Not Modify): Observation [10022]       Medical History Past Medical History:  Diagnosis Date  . Cardiac arrest ([place])   . Complication of anesthesia   . Diabetes mellitus type 1 ([place])    [facility]  . DKA (diabetic ketoacidoses) ([place])   . ESRD (end stage renal disease) (HCC)    TTS HD, dialyzed at [facility]  . Hypertension   . PONV (postoperative nausea and vomiting)   . Stroke [facility])     Allergies No Known Allergies  IV Location/Drains/Wounds Patient Lines/Drains/Airways Status   Active Line/Drains/Airways    Name:   Placement date:   Placement time:   Site:    Days:   Peripheral IV 03/21/18 Right;Anterior Forearm   03/21/18    2322    Forearm   1   Fistula / Graft Left Upper arm Arteriovenous fistula   -    -    Upper arm      Hemodialysis Catheter Right Internal jugular Double-lumen;Permanent   01/26/17    1500    Internal jugular   420          Labs/Imaging Results for orders placed or performed during the hospital encounter of 03/21/18 (from the past 48 hour(s))  CBG monitoring, ED     Status: Abnormal   Collection Time: 03/21/18  8:20 PM  Result Value Ref Range   Glucose-Capillary 523 (HH) 70 - 99 mg/dL  Lipase, blood     Status: None   Collection Time: 03/21/18  9:08 PM  Result Value Ref Range   Lipase 30 11 - 51 U/L    Comment: Performed at [facility], [place] [address], [place], [place] [address]  Comprehensive metabolic panel     Status: Abnormal   Collection Time: 03/21/18  9:08 PM  Result Value Ref Range   Sodium 133 (L) 135 - 145 mmol/L   Potassium 5.4 (H) 3.5 - 5.1 mmol/L   Chloride 91 (L) 98 - 111 mmol/L   CO2 24 22 - 32 mmol/L   Glucose, Bld 520 (HH) 70 - 99 mg/dL  Comment: CRITICAL RESULT CALLED TO, READ BACK BY AND VERIFIED WITH: Josep Luviano,A RN AT 2236 03/21/18 BY TIBBITTS,K    BUN 22 (H) 6 - 20 mg/dL   Creatinine, Ser 6.89 (H) 0.44 - 1.00 mg/dL   Calcium 7.9 (L) 8.9 - 10.3 mg/dL   Total Protein 8.3 (H) 6.5 - 8.1 g/dL   Albumin 4.1 3.5 - 5.0 g/dL   AST 23 15 - 41 U/L   ALT 19 0 - 44 U/L   Alkaline Phosphatase 116 38 - 126 U/L   Total Bilirubin 0.3 0.3 - 1.2 mg/dL   GFR calc non Af Amer 7 (L) >60 mL/min   GFR calc Af Amer 9 (L) >60 mL/min    Comment: (NOTE) The eGFR has been calculated using the CKD EPI equation. This calculation has not been validated in all clinical situations. eGFR's persistently <60 mL/min signify possible Chronic Kidney Disease.    Anion gap 18 (H) 5 - 15    Comment: Performed at [facility], [place] [address] [place], [place] [address]  CBC      Status: None   Collection Time: 03/21/18  9:08 PM  Result Value Ref Range   WBC 5.3 4.0 - 10.5 K/uL   RBC 4.20 3.87 - 5.11 MIL/uL   Hemoglobin 13.0 12.0 - 15.0 g/dL   HCT 40.5 36.0 - 46.0 %   MCV 96.4 78.0 - 100.0 fL   MCH 31.0 26.0 - 34.0 pg   MCHC 32.1 30.0 - 36.0 g/dL   RDW 14.2 11.5 - 15.5 %   Platelets 246 150 - 400 K/uL    Comment: Performed at [facility], [place] [address], [place], [place] [address]  Urinalysis, Routine w reflex microscopic     Status: Abnormal   Collection Time: 03/21/18  9:09 PM  Result Value Ref Range   Color, Urine YELLOW YELLOW   APPearance TURBID (A) CLEAR   Specific Gravity, Urine 1.014 1.005 - 1.030   pH 8.0 5.0 - 8.0   Glucose, UA >=500 (A) NEGATIVE mg/dL   Hgb urine dipstick MODERATE (A) NEGATIVE   Bilirubin Urine NEGATIVE NEGATIVE   Ketones, ur NEGATIVE NEGATIVE mg/dL   Protein, ur >=300 (A) NEGATIVE mg/dL   Nitrite NEGATIVE NEGATIVE   Leukocytes, UA LARGE (A) NEGATIVE   RBC / HPF >50 (H) 0 - 5 RBC/hpf   WBC, UA >50 (H) 0 - 5 WBC/hpf   Bacteria, UA FEW (A) NONE SEEN   Squamous Epithelial / LPF >50 (H) 0 - 5   WBC Clumps PRESENT    Non Squamous Epithelial 11-20 (A) NONE SEEN    Comment: Performed at [facility], [place] [address] [place], [place] [address]  I-Stat beta hCG blood, ED     Status: None   Collection Time: 03/21/18  9:14 PM  Result Value Ref Range   I-stat hCG, quantitative <5.0 <5 mIU/mL   Comment 3            Comment:   GEST. AGE      CONC.  (mIU/mL)   <=1 WEEK        5 - 50     2 WEEKS       50 - 500     3 WEEKS       100 - 10,000     4 WEEKS     1,000 - 30,000        FEMALE AND NON-PREGNANT FEMALE:     LESS  THAN 5 mIU/mL   CBG monitoring, ED     Status: Abnormal   Collection Time: 03/21/18 10:02 PM  Result Value Ref Range   Glucose-Capillary 460 (H) 70 - 99 mg/dL  CBG monitoring, ED     Status: Abnormal   Collection Time: 03/21/18 11:46 PM  Result Value Ref Range    Glucose-Capillary 344 (H) 70 - 99 mg/dL   No results found.  Pending Labs Unresulted Labs (From admission, onward)   None      Vitals/Pain Today's Vitals   03/21/18 1910 03/21/18 2014 03/21/18 2231 03/21/18 2349  BP: (!) 144/86  106/74 104/64  Pulse: 86  85 84  Resp: _0 Temp: 99.3 F (37.4 C)     TempSrc: Oral     SpO2: 100%  100% 100%  Weight: 56.7 kg     Height: _1  (1.549 m)     PainSc:  9       Isolation Precautions No active isolations  Medications Medications  dextrose 5 %-0.45 % sodium chloride infusion ( Intravenous Stopped 03/21/18 2353)  insulin regular (NOVOLIN R,HUMULIN R) 100 Units in sodium chloride 0.9 % 100 mL (1 Units/mL) infusion (2.8 Units/hr Intravenous New Bag/Given 03/21/18 2356)    Mobility walks

## 2018-03-22 NOTE — Discharge Summary (Signed)
PATIENT DETAILS Name: [name] Age: 27 y.o. Sex: female Date of Birth: [birth year] MRN: [id number]. Admitting Physician: Rise Patience, MD IPJ:[id number], No Pcp Per  Admit Date: 03/21/2018 Discharge date: 03/22/2018  Recommendations for Outpatient Follow-up:  1. Follow up with PCP in 1-2 weeks 2. Please obtain BMP/CBC in one week  Admitted From:  Home  Disposition: [place]: No  Equipment/Devices: None  Discharge Condition: Stable  CODE STATUS: FULL CODE  Diet recommendation:  Heart Healthy / Carb Modified   Brief Summary: See H&P, Labs, Consult and Test reports for all details in brief, patient is a 27 year old female with history of type I DM, ESRD on TTS schedule-presents with dizziness following HD.  She was also found to have mild DKA-and admitted to the hospitalist service.  See below for further details  Brief Hospital Course: Mild DKA: Rapidly resolved after initiation of IV insulin.  Has been transition to Lantus 22 units and SSI-CBGs are stable-stable for discharge for further optimization of her diabetic regimen in the outpatient setting  Dizziness: Orthostatic vital signs were negative this morning-suspect it may have been due to orthostatic hypotension after HD.  Have advised patient to decrease amlodipine to 5 mg-if these episodes continue in the future-may need to adjust antihypertensives more.  She is currently asymptomatic-does not have any dizziness-has ambulated without any major issues this morning.  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, uncontrolled (HCC)   Normocytic normochromic anemia   ESRD (end stage renal disease) ([place])    Dizziness   Discharge Instructions:  Activity:  As tolerated  Discharge Instructions    Call MD for:  persistant dizziness or light-headedness   Complete by:  As directed    Diet - low sodium heart healthy   Complete by:  As directed    Diet Carb Modified   Complete by:  As directed    Increase activity slowly   Complete by:  As directed      Allergies as of 03/22/2018   No Known Allergies     Medication List    STOP taking these medications   cefpodoxime 200 MG tablet Commonly known as:  VANTIN     TAKE these medications   amLODipine 5 MG tablet Commonly known as:  NORVASC Take 1 tablet (5 mg total) by mouth daily. What changed:    medication strength  how much to take   calcium acetate 667 MG capsule Commonly known as:  PHOSLO Take 3 capsules (2,001 mg total) by mouth 3 (three) times daily with meals.   insulin aspart 100 UNIT/ML injection Commonly known as:  novoLOG Inject 0-9 Units into the skin 3 (three) times daily with meals.   insulin glargine 100 UNIT/ML injection Commonly known as:  LANTUS Inject 0.22 mLs (22 Units total) into the skin at bedtime. What changed:  how much to take   metoprolol succinate 25 MG 24 hr tablet Commonly known as:  TOPROL-XL Take 1 tablet (25 mg total) by mouth daily.   multivitamin Tabs tablet Take 1 tablet by mouth at bedtime.   norethindrone 0.35 MG tablet Commonly known as:  MICRONOR,CAMILA,ERRIN Take 1 tablet (0.35 mg total) by mouth daily.   SENSIPAR 60 MG tablet  Generic drug:  cinacalcet Take 60 mg by mouth daily.      Follow-up Information    Hemodialysis center Follow up.   Why:  Follow at your usual schedule       Primary care practitioner. Schedule an appointment as soon as possible for a visit in 1 week(s).          No Known Allergies  Consultations:   None  Other Procedures/Studies:  No results found.   TODAY-DAY OF DISCHARGE:  Subjective:   Audreena Langlais today has no  headache,no chest abdominal pain,no new weakness tingling or numbness, feels much better wants to go home today.   Objective:   Blood pressure 113/82, pulse 88, temperature 98.5 F (36.9 C), temperature source Oral, resp. rate 16, height 5\' 1"  (1.549 m), weight 56.7 kg, last menstrual period 03/21/2018, SpO2 100 %.  Intake/Output Summary (Last 24 hours) at 03/22/2018 1421 Last data filed at 03/22/2018 0500 Gross per 24 hour  Intake 146.29 ml  Output -  Net 146.29 ml   Filed Weights   03/21/18 1910  Weight: 56.7 kg    Exam: Awake Alert, Oriented *3, No new F.N deficits, Normal affect [place].AT,PERRAL Supple Neck,No JVD, No cervical lymphadenopathy appriciated.  Symmetrical Chest wall movement, Good air movement bilaterally, CTAB RRR,No Gallops,Rubs or new Murmurs, No Parasternal Heave +ve B.Sounds, Abd Soft, Non tender, No organomegaly appriciated, No rebound -guarding or rigidity. No Cyanosis, Clubbing or edema, No new Rash or bruise   PERTINENT RADIOLOGIC STUDIES: No results found.   PERTINENT LAB RESULTS: CBC: Recent Labs    03/21/18 2108 03/22/18 0231  WBC 5.3 6.3  HGB 13.0 12.1  HCT 40.5 38.7  PLT 246 241   CMET CMP     Component Value Date/Time   NA 137 03/22/2018 0948   K 4.8 03/22/2018 0948   CL 98 03/22/2018 0948   CO2 27 03/22/2018 0948   GLUCOSE 82 03/22/2018 0948   BUN 31 (H) 03/22/2018 0948   CREATININE 8.74 (H) 03/22/2018 0948   CREATININE 1.30 (H) 04/24/2014 1424   CALCIUM 8.4 (L) 03/22/2018 0948   PROT 8.3 (H) 03/21/2018 2108   ALBUMIN 4.1 03/21/2018 2108   AST 23 03/21/2018 2108   ALT 19 03/21/2018 2108   ALKPHOS 116 03/21/2018 2108   BILITOT 0.3 03/21/2018 2108   GFRNONAA 6 (L) 03/22/2018 0948   GFRNONAA 58 (L) 04/24/2014 1424   GFRAA 6 (L) 03/22/2018 0948   GFRAA 67 04/24/2014 1424    GFR Estimated Creatinine Clearance: 7.4 mL/min (A) (by C-G formula based on SCr of 8.74 mg/dL (H)). Recent Labs    03/21/18 2108  LIPASE 30   No  results for input(s): CKTOTAL, CKMB, CKMBINDEX, TROPONINI in the last 72 hours. Invalid input(s): POCBNP No results for input(s): DDIMER in the last 72 hours. No results for input(s): HGBA1C in the last 72 hours. No results for input(s): CHOL, HDL, LDLCALC, TRIG, CHOLHDL, LDLDIRECT in the last 72 hours. No results for input(s): TSH, T4TOTAL, T3FREE, THYROIDAB in the last 72 hours.  Invalid input(s): FREET3 No results for input(s): VITAMINB12, FOLATE, FERRITIN, TIBC, IRON, RETICCTPCT in the last 72 hours. Coags: No results for input(s): INR in the last 72 hours.  Invalid input(s): PT Microbiology: Recent Results (from the past 240 hour(s))  MRSA PCR Screening     Status: None   Collection Time: 03/22/18  2:37 AM  Result Value Ref Range Status   MRSA by PCR NEGATIVE NEGATIVE Final    Comment:  The GeneXpert MRSA Assay (FDA approved for NASAL specimens only), is one component of a comprehensive MRSA colonization surveillance program. It is not intended to diagnose MRSA infection nor to guide or monitor treatment for MRSA infections. Performed at [facility], [place] [address], [place], [place] [address]     FURTHER DISCHARGE INSTRUCTIONS:  Get Medicines reviewed and adjusted: Please take all your medications with you for your next visit with your Primary MD  Laboratory/radiological data: Please request your Primary MD to go over all hospital tests and procedure/radiological results at the follow up, please ask your Primary MD to get all Hospital records sent to his/her office.  In some cases, they will be blood work, cultures and biopsy results pending at the time of your discharge. Please request that your primary care M.D. goes through all the records of your hospital data and follows up on these results.  Also Note the following: If you experience worsening of your admission symptoms, develop shortness of breath, life threatening emergency, suicidal or homicidal  thoughts you must seek medical attention immediately by calling 911 or calling your MD immediately  if symptoms less severe.  You must read complete instructions/literature along with all the possible adverse reactions/side effects for all the Medicines you take and that have been prescribed to you. Take any new Medicines after you have completely understood and accpet all the possible adverse reactions/side effects.   Do not drive when taking Pain medications or sleeping medications (Benzodaizepines)  Do not take more than prescribed Pain, Sleep and Anxiety Medications. It is not advisable to combine anxiety,sleep and pain medications without talking with your primary care practitioner  Special Instructions: If you have smoked or chewed Tobacco  in the last 2 yrs please stop smoking, stop any regular Alcohol  and or any Recreational drug use.  Wear Seat belts while driving.  Please note: You were cared for by a hospitalist during your hospital stay. Once you are discharged, your primary care physician will handle any further medical issues. Please note that NO REFILLS for any discharge medications will be authorized once you are discharged, as it is imperative that you return to your primary care physician (or establish a relationship with a primary care physician if you do not have one) for your post hospital discharge needs so that they can reassess your need for medications and monitor your lab values.  Total Time spent coordinating discharge including counseling, education and face to face time equals 25 minutes.  Signed: Shanker Ghimire 03/22/2018 2:21 PM

## 2018-03-22 NOTE — Progress Notes (Signed)
Pt given discharge instructions, prescriptions, and care notes. Pt verbalized understanding AEB no further questions or concerns at this time. IV was discontinued, no redness, pain, or swelling noted at this time. Telemetry discontinued and Centralized Telemetry was notified. Pt left the floor via wheelchair with staff in stable condition. 

## 2018-05-29 DIAGNOSIS — D509 Iron deficiency anemia, unspecified: Secondary | ICD-10-CM | POA: Insufficient documentation

## 2018-06-12 ENCOUNTER — Emergency Department (HOSPITAL_COMMUNITY)
Admission: EM | Admit: 2018-06-12 | Discharge: 2018-06-12 | Disposition: A | Discharge status: Home / Self Care | Payer: Medicaid Other | Attending: Emergency Medicine | Admitting: Emergency Medicine

## 2018-06-12 ENCOUNTER — Other Ambulatory Visit: Payer: Self-pay

## 2018-06-12 ENCOUNTER — Emergency Department (HOSPITAL_COMMUNITY): Payer: Medicaid Other

## 2018-06-12 ENCOUNTER — Encounter (HOSPITAL_COMMUNITY): Payer: Self-pay | Admitting: Emergency Medicine

## 2018-06-12 DIAGNOSIS — E1122 Type 2 diabetes mellitus with diabetic chronic kidney disease: Secondary | ICD-10-CM | POA: Insufficient documentation

## 2018-06-12 DIAGNOSIS — I10 Essential (primary) hypertension: Secondary | ICD-10-CM

## 2018-06-12 DIAGNOSIS — R51 Headache: Secondary | ICD-10-CM | POA: Diagnosis present

## 2018-06-12 DIAGNOSIS — N186 End stage renal disease: Secondary | ICD-10-CM | POA: Diagnosis not present

## 2018-06-12 DIAGNOSIS — I120 Hypertensive chronic kidney disease with stage 5 chronic kidney disease or end stage renal disease: Secondary | ICD-10-CM | POA: Diagnosis not present

## 2018-06-12 DIAGNOSIS — R110 Nausea: Secondary | ICD-10-CM

## 2018-06-12 DIAGNOSIS — G44209 Tension-type headache, unspecified, not intractable: Secondary | ICD-10-CM

## 2018-06-12 DIAGNOSIS — Z992 Dependence on renal dialysis: Secondary | ICD-10-CM | POA: Insufficient documentation

## 2018-06-12 DIAGNOSIS — R112 Nausea with vomiting, unspecified: Secondary | ICD-10-CM | POA: Diagnosis not present

## 2018-06-12 LAB — CBC WITH DIFFERENTIAL/PLATELET
ABS IMMATURE GRANULOCYTES: 0.02 10*3/uL (ref 0.00–0.07)
BASOS ABS: 0 10*3/uL (ref 0.0–0.1)
Basophils Relative: 0 %
Eosinophils Absolute: 0.1 10*3/uL (ref 0.0–0.5)
Eosinophils Relative: 1 %
HCT: 37.9 % (ref 36.0–46.0)
HEMOGLOBIN: 12.3 g/dL (ref 12.0–15.0)
IMMATURE GRANULOCYTES: 0 %
LYMPHS ABS: 0.9 10*3/uL (ref 0.7–4.0)
LYMPHS PCT: 9 %
MCH: 30.1 pg (ref 26.0–34.0)
MCHC: 32.5 g/dL (ref 30.0–36.0)
MCV: 92.9 fL (ref 80.0–100.0)
MONOS PCT: 4 %
Monocytes Absolute: 0.4 10*3/uL (ref 0.1–1.0)
NEUTROS PCT: 86 %
NRBC: 0 % (ref 0.0–0.2)
Neutro Abs: 8 10*3/uL — ABNORMAL HIGH (ref 1.7–7.7)
Platelets: 204 10*3/uL (ref 150–400)
RBC: 4.08 MIL/uL (ref 3.87–5.11)
RDW: 13.2 % (ref 11.5–15.5)
WBC: 9.3 10*3/uL (ref 4.0–10.5)

## 2018-06-12 LAB — COMPREHENSIVE METABOLIC PANEL
ALBUMIN: 4.1 g/dL (ref 3.5–5.0)
ALT: 26 U/L (ref 0–44)
ANION GAP: 10 (ref 5–15)
AST: 27 U/L (ref 15–41)
Alkaline Phosphatase: 101 U/L (ref 38–126)
BUN: 12 mg/dL (ref 6–20)
CHLORIDE: 100 mmol/L (ref 98–111)
CO2: 29 mmol/L (ref 22–32)
Calcium: 11.2 mg/dL — ABNORMAL HIGH (ref 8.9–10.3)
Creatinine, Ser: 4.14 mg/dL — ABNORMAL HIGH (ref 0.44–1.00)
GFR calc non Af Amer: 14 mL/min — ABNORMAL LOW (ref >60)
GFR, EST AFRICAN AMERICAN: 16 mL/min — AB (ref >60)
GLUCOSE: 128 mg/dL — AB (ref 70–99)
Potassium: 3.5 mmol/L (ref 3.5–5.1)
SODIUM: 139 mmol/L (ref 135–145)
Total Bilirubin: 0.9 mg/dL (ref 0.3–1.2)
Total Protein: 7.9 g/dL (ref 6.5–8.1)

## 2018-06-12 LAB — CBG MONITORING, ED
Glucose-Capillary: 114 mg/dL — ABNORMAL HIGH (ref 70–99)
Glucose-Capillary: 71 mg/dL (ref 70–99)

## 2018-06-12 MED ORDER — ONDANSETRON HCL 4 MG/2ML IJ SOLN
4.0000 mg | Freq: Once | INTRAMUSCULAR | Status: AC
  Administered 2018-06-12: 4 mg via INTRAVENOUS
  Filled 2018-06-12: qty 2

## 2018-06-12 MED ORDER — ONDANSETRON 4 MG PO TBDP: 4.0000 mg | ORAL_TABLET | Freq: Three times a day (TID) | ORAL | 0 refills | Status: DC | PRN

## 2018-06-12 MED ORDER — AMLODIPINE BESYLATE 5 MG PO TABS
5.0000 mg | ORAL_TABLET | Freq: Once | ORAL | Status: AC
  Administered 2018-06-12: 5 mg via ORAL
  Filled 2018-06-12: qty 1

## 2018-06-12 MED ORDER — MORPHINE SULFATE (PF) 4 MG/ML IV SOLN
4.0000 mg | Freq: Once | INTRAVENOUS | Status: AC
  Administered 2018-06-12: 4 mg via INTRAVENOUS
  Filled 2018-06-12: qty 1

## 2018-06-12 MED ORDER — ONDANSETRON 4 MG PO TBDP
4.0000 mg | ORAL_TABLET | Freq: Once | ORAL | Status: AC
  Administered 2018-06-12: 4 mg via ORAL
  Filled 2018-06-12: qty 1

## 2018-06-12 MED ORDER — METOPROLOL SUCCINATE ER 25 MG PO TB24
25.0000 mg | ORAL_TABLET | Freq: Every day | ORAL | Status: DC
  Administered 2018-06-12: 25 mg via ORAL
  Filled 2018-06-12: qty 1

## 2018-06-12 NOTE — ED Notes (Signed)
GOT PATIENT ON THE MONITOR PATIENT IS RESTING WITH CALL BELL IN [facility] AND NURSE AT BEDSIDE

## 2018-06-12 NOTE — ED Triage Notes (Signed)
Arrived via EMS patient having dialysis for 3.5 hours and developed high blood pressure, headache, nausea, and multiple episodes of emesis.  Patient alert answering and following commands appropriate.

## 2018-06-12 NOTE — Progress Notes (Signed)
Inpatient Diabetes Program Recommendations  AACE/ADA: New Consensus Statement on Inpatient Glycemic Control (2015)  Target Ranges:  Prepandial:   less than 140 mg/dL      Peak postprandial:   less than 180 mg/dL (1-2 hours)      Critically ill patients:  140 - 180 mg/dL   Lab Results  Component Value Date   GLUCAP 114 (H) 06/12/2018   HGBA1C 9.1 (H) 05/14/2017    Review of Glycemic Control Results for [name] (MRN [id number]) as of 06/12/2018 13:11  Ref. Range 06/12/2018 13:16  Glucose-Capillary Latest Ref Range: 70 - 99 mg/dL 114 (H)   Diabetes history: Type 1 DM (requiring basal and meal coverage) Outpatient Diabetes medications: Lantus 22 units QHS, Novolog 0-9 units TID Current orders for Inpatient glycemic control: none  Inpatient Diabetes Program Recommendations:    Last A1C was 8% on 03/23/18 which is an improvement in reviewing chart. Patient sees endocrinology at [facility] and is awaiting kidney transplant.   If to remain inpatient, consider adding: -Lantus 18 units QHS -Novolog 0-9 units Q4H  Thanks, Bronson Curb, MSN, RNC-OB Diabetes Coordinator [phone] (8a-5p)

## 2018-06-12 NOTE — ED Provider Notes (Signed)
Emergency Department Provider Note   I have reviewed the triage vital signs and the nursing notes.   HISTORY  Chief Complaint Nausea; Emesis; Headache; and Hypertension   HPI [name] is a 27 y.o. female with PMH of DM, DKA, ESRD on HD, HTN, and prior CVA presents to the emergency department for evaluation of headache with nausea vomiting.  Symptoms began during dialysis today toward the end of her session.  She denies any sudden onset/maximal intensity headache symptoms.  She describes diffuse, nonfocal headache.  She has had 2 episodes of vomiting with no blood.  No fevers or chills.  Patient denies history of similar headaches.  No numbness, tingling, weakness.  No head injury or neck pain. Patient noted to have HTN.   Past Medical History:  Diagnosis Date  . Cardiac arrest ([place])   . Complication of anesthesia   . Diabetes mellitus type 1 ([place])    2005 [facility]  . DKA (diabetic ketoacidoses) ([place])   . ESRD (end stage renal disease) ([facility])    TTS HD, dialyzed at [facility]  . Hypertension   . PONV (postoperative nausea and vomiting)   . Stroke [facility])     Patient Active Problem List   Diagnosis Date Noted  . Dizziness 03/22/2018  . Multifocal pneumonia 05/14/2017  . Normocytic normochromic anemia 01/26/2017  . ESRD (end stage renal disease) ([place]) 01/26/2017  . Fluid overload 01/26/2017  . Depression 07/04/2014  . Hemodialysis-associated hypotension 04/24/2014  . Cerebral infarction due to thrombosis of right middle cerebral artery ([place]) 04/24/2014  . History of anemia 04/17/2014  . Cardiac arrest ([place]) 04/14/2014  . Diabetic neuropathy ([place]) 02/14/2014  . DKA, type 1 ([place]) 02/12/2014  . Tachycardia 02/12/2014  . DOE (dyspnea on exertion) 02/12/2014  . Abdominal pain 02/12/2014  . IDDM (insulin dependent diabetes mellitus) ([place]) 02/12/2014  . 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 Hypokalemia  12/07/2013  . Noncompliance 12/06/2013  . DKA (diabetic ketoacidoses) ([place]) 12/05/2013  . Leukocytosis 12/05/2013  . Acute on chronic renal failure ([place]) 12/05/2013  . Hypercalcemia 12/05/2013  . Abnormal LFTs 12/05/2013  . Elevated lipase 12/05/2013    Past Surgical History:  Procedure Laterality Date  . CATARACT EXTRACTION     6 years ago  . DIALYSIS FISTULA CREATION    . IR FLUORO GUIDE CV LINE RIGHT  01/26/2017  . IR REMOVAL TUN CV CATH W/O FL  05/16/2017  . IR US GUIDE VASC ACCESS RIGHT  01/26/2017   Allergies Patient has no known allergies.  Family History  Problem Relation Age of Onset  . Diabetes Maternal Grandmother   . Stroke Maternal Grandmother   . Cancer Maternal Aunt        aunt, passed last month, ?RCC  . Cancer Maternal Uncle     Social History Social History   Tobacco Use  . Smoking status: Never Smoker  . Smokeless tobacco: Never Used  Substance Use Topics  . Alcohol use: No    Alcohol/week: 0.0 standard drinks  . Drug use: No    Review of Systems  Constitutional: No fever/chills Eyes: No visual changes. ENT: No sore throat. Cardiovascular: Denies chest pain. Respiratory: Denies shortness of breath. Gastrointestinal: No abdominal pain. Positive nausea and vomiting.  No diarrhea.  No constipation. Genitourinary: Negative for dysuria. Musculoskeletal: Negative for back pain. Skin: Negative for rash. Neurological: Negative for focal weakness or numbness. Positive HA.   10-point ROS otherwise negative.  ____________________________________________   PHYSICAL EXAM:  VITAL SIGNS: ED Triage Vitals  Enc Vitals Group     BP 06/12/18 1208 (!) 220/117     Pulse Rate 06/12/18 1208 (!) 107     Resp 06/12/18 1208 18     Temp 06/12/18 1208 98.8 F (37.1 C)     Temp Source 06/12/18 1208 Oral     SpO2 06/12/18 1208 100 %     Weight 06/12/18 1216 123 lb (55.8 kg)     Height 06/12/18 1216 5\' 1"  (1.549 m)   Constitutional: Alert and oriented. Well  appearing and in no acute distress. Eyes: Conjunctivae are normal. PERRL.  Head: Atraumatic. Nose: No congestion/rhinnorhea. Mouth/Throat: Mucous membranes are moist. Neck: No stridor.  Cardiovascular: Tachycardia. Good peripheral circulation. Grossly normal heart sounds. HD access on left arm. Well appearing with palpable thrill.  Respiratory: Normal respiratory effort.  No retractions. Lungs CTAB. Gastrointestinal: Soft and nontender. No distention.  Musculoskeletal: No lower extremity tenderness nor edema. No gross deformities of extremities. Neurologic:  Normal speech and language. No gross focal neurologic deficits are appreciated. Normal CN exam 2-12.  Skin:  Skin is warm, dry and intact. No rash noted.  ____________________________________________   LABS (all labs ordered are listed, but only abnormal results are displayed)  Labs Reviewed  COMPREHENSIVE METABOLIC PANEL - Abnormal; Notable for the following components:      Result Value   Glucose, Bld 128 (*)    Creatinine, Ser 4.14 (*)    Calcium 11.2 (*)    GFR calc non Af Amer 14 (*)    GFR calc Af Amer 16 (*)    All other components within normal limits  CBC WITH DIFFERENTIAL/PLATELET - Abnormal; Notable for the following components:   Neutro Abs 8.0 (*)    All other components within normal limits  CBG MONITORING, ED - Abnormal; Notable for the following components:   Glucose-Capillary 114 (*)    All other components within normal limits  CBG MONITORING, ED   ____________________________________________  RADIOLOGY  Ct Head Wo Contrast  Result Date: 06/12/2018 CLINICAL DATA:  Onset headache today.  No known injury. EXAM: CT HEAD WITHOUT CONTRAST TECHNIQUE: Contiguous axial images were obtained from the base of the skull through the vertex without intravenous contrast. COMPARISON:  None. FINDINGS: Brain: No evidence of acute infarction, hemorrhage, hydrocephalus, extra-axial collection or mass lesion/mass effect.  Vascular: No hyperdense vessel or unexpected calcification. Skull: Negative. Sinuses/Orbits: Negative. Other: None. IMPRESSION: Normal head CT. Electronically Signed   By: Inge Rise M.D.   On: 06/12/2018 15:26    ____________________________________________   PROCEDURES  Procedure(s) performed:   Procedures  None ____________________________________________   INITIAL IMPRESSION / ASSESSMENT AND PLAN / ED COURSE  Pertinent labs & imaging results that were available during my care of the patient were reviewed by me and considered in my medical decision making (see chart for details).  Patient presents to the emergency department for evaluation of headache with nausea vomiting.  Symptoms began during dialysis.  Patient does have significantly elevated blood pressures here.  No focal neurological deficits.  Given the patient's vomiting and elevated blood pressure I do plan for CT imaging of the head. Considering PRES as a possible diagnosis.   03:30 PM Patient's BP and HA improved with pain medication. Plan to give patient's home BP meds which have not been taken yet today. No DKA. No acute findings on labs or CT head. No SOB. Patient with some nausea symptoms. No evidence of  end-organ damage related to HTN at this time. Plan for home BP meds prior to discharge and Zofran PRN at home. Repeat CBG normal.   At this time, I do not feel there is any life-threatening condition present. I have reviewed and discussed all results (EKG, imaging, lab, urine as appropriate), exam findings with patient. I have reviewed nursing notes and appropriate previous records.  I feel the patient is safe to be discharged home without further emergent workup. Discussed usual and customary return precautions. Patient and family (if present) verbalize understanding and are comfortable with this plan.  Patient will follow-up with their primary care provider. If they do not have a primary care provider, information  for follow-up has been provided to them. All questions have been answered.  ____________________________________________  FINAL CLINICAL IMPRESSION(S) / ED DIAGNOSES  Final diagnoses:  Essential hypertension  Acute non intractable tension-type headache  Nausea     MEDICATIONS GIVEN DURING THIS VISIT:  Medications  metoprolol succinate (TOPROL-XL) 24 hr tablet 25 mg (25 mg Oral Given 06/12/18 1555)  morphine 4 MG/ML injection 4 mg (4 mg Intravenous Given 06/12/18 1308)  ondansetron (ZOFRAN) injection 4 mg (4 mg Intravenous Given 06/12/18 1308)  amLODipine (NORVASC) tablet 5 mg (5 mg Oral Given 06/12/18 1555)  ondansetron (ZOFRAN-ODT) disintegrating tablet 4 mg (4 mg Oral Given 06/12/18 1547)     NEW OUTPATIENT MEDICATIONS STARTED DURING THIS VISIT:  New Prescriptions   ONDANSETRON (ZOFRAN ODT) 4 MG DISINTEGRATING TABLET    Take 1 tablet (4 mg total) by mouth every 8 (eight) hours as needed for nausea or vomiting.    Note:  This document was prepared using Dragon voice recognition software and may include unintentional dictation errors.  Nanda [place], MD Emergency Medicine    Jaeger Trueheart, Wonda Olds, MD 06/12/18 [phone]

## 2018-06-12 NOTE — Discharge Instructions (Signed)

## 2018-06-26 ENCOUNTER — Emergency Department (HOSPITAL_COMMUNITY): Payer: Medicaid Other

## 2018-06-26 ENCOUNTER — Inpatient Hospital Stay (HOSPITAL_COMMUNITY)
Admission: EM | Admit: 2018-06-26 | Discharge: 2018-06-28 | DRG: 193 | Disposition: A | Discharge status: Home / Self Care | Payer: Medicaid Other | Attending: Internal Medicine | Admitting: Internal Medicine

## 2018-06-26 DIAGNOSIS — E1065 Type 1 diabetes mellitus with hyperglycemia: Secondary | ICD-10-CM | POA: Diagnosis present

## 2018-06-26 DIAGNOSIS — J189 Pneumonia, unspecified organism: Secondary | ICD-10-CM | POA: Diagnosis present

## 2018-06-26 DIAGNOSIS — N2581 Secondary hyperparathyroidism of renal origin: Secondary | ICD-10-CM | POA: Diagnosis present

## 2018-06-26 DIAGNOSIS — Z79899 Other long term (current) drug therapy: Secondary | ICD-10-CM | POA: Diagnosis not present

## 2018-06-26 DIAGNOSIS — E1029 Type 1 diabetes mellitus with other diabetic kidney complication: Secondary | ICD-10-CM | POA: Diagnosis not present

## 2018-06-26 DIAGNOSIS — J181 Lobar pneumonia, unspecified organism: Secondary | ICD-10-CM | POA: Diagnosis present

## 2018-06-26 DIAGNOSIS — N186 End stage renal disease: Secondary | ICD-10-CM | POA: Diagnosis present

## 2018-06-26 DIAGNOSIS — I120 Hypertensive chronic kidney disease with stage 5 chronic kidney disease or end stage renal disease: Secondary | ICD-10-CM | POA: Diagnosis present

## 2018-06-26 DIAGNOSIS — Z794 Long term (current) use of insulin: Secondary | ICD-10-CM

## 2018-06-26 DIAGNOSIS — D631 Anemia in chronic kidney disease: Secondary | ICD-10-CM | POA: Diagnosis present

## 2018-06-26 DIAGNOSIS — IMO0002 Reserved for concepts with insufficient information to code with codable children: Secondary | ICD-10-CM | POA: Diagnosis present

## 2018-06-26 DIAGNOSIS — E1022 Type 1 diabetes mellitus with diabetic chronic kidney disease: Secondary | ICD-10-CM | POA: Diagnosis present

## 2018-06-26 DIAGNOSIS — Z8673 Personal history of transient ischemic attack (TIA), and cerebral infarction without residual deficits: Secondary | ICD-10-CM

## 2018-06-26 DIAGNOSIS — Z992 Dependence on renal dialysis: Secondary | ICD-10-CM

## 2018-06-26 DIAGNOSIS — R0902 Hypoxemia: Secondary | ICD-10-CM | POA: Diagnosis present

## 2018-06-26 DIAGNOSIS — R0602 Shortness of breath: Secondary | ICD-10-CM | POA: Diagnosis present

## 2018-06-26 DIAGNOSIS — E871 Hypo-osmolality and hyponatremia: Secondary | ICD-10-CM | POA: Diagnosis present

## 2018-06-26 DIAGNOSIS — Z833 Family history of diabetes mellitus: Secondary | ICD-10-CM | POA: Diagnosis not present

## 2018-06-26 DIAGNOSIS — Y95 Nosocomial condition: Secondary | ICD-10-CM | POA: Diagnosis present

## 2018-06-26 DIAGNOSIS — R739 Hyperglycemia, unspecified: Secondary | ICD-10-CM

## 2018-06-26 LAB — INFLUENZA PANEL BY PCR (TYPE A & B)
Influenza A By PCR: NEGATIVE
Influenza B By PCR: NEGATIVE

## 2018-06-26 LAB — CBC WITH DIFFERENTIAL/PLATELET
Abs Immature Granulocytes: 0.02 10*3/uL (ref 0.00–0.07)
Basophils Absolute: 0 10*3/uL (ref 0.0–0.1)
Basophils Relative: 0 %
Eosinophils Absolute: 0 10*3/uL (ref 0.0–0.5)
Eosinophils Relative: 1 %
HCT: 36.1 % (ref 36.0–46.0)
Hemoglobin: 11.1 g/dL — ABNORMAL LOW (ref 12.0–15.0)
Immature Granulocytes: 0 %
Lymphocytes Relative: 5 %
Lymphs Abs: 0.5 10*3/uL — ABNORMAL LOW (ref 0.7–4.0)
MCH: 29.1 pg (ref 26.0–34.0)
MCHC: 30.7 g/dL (ref 30.0–36.0)
MCV: 94.8 fL (ref 80.0–100.0)
MONO ABS: 0.5 10*3/uL (ref 0.1–1.0)
Monocytes Relative: 6 %
Neutro Abs: 7.6 10*3/uL (ref 1.7–7.7)
Neutrophils Relative %: 88 %
Platelets: 230 10*3/uL (ref 150–400)
RBC: 3.81 MIL/uL — ABNORMAL LOW (ref 3.87–5.11)
RDW: 13.5 % (ref 11.5–15.5)
WBC: 8.6 10*3/uL (ref 4.0–10.5)
nRBC: 0 % (ref 0.0–0.2)

## 2018-06-26 LAB — COMPREHENSIVE METABOLIC PANEL
ALT: 24 U/L (ref 0–44)
AST: 28 U/L (ref 15–41)
Albumin: 3.6 g/dL (ref 3.5–5.0)
Alkaline Phosphatase: 117 U/L (ref 38–126)
Anion gap: 13 (ref 5–15)
BUN: 42 mg/dL — ABNORMAL HIGH (ref 6–20)
CO2: 24 mmol/L (ref 22–32)
CREATININE: 10.21 mg/dL — AB (ref 0.44–1.00)
Calcium: 8.3 mg/dL — ABNORMAL LOW (ref 8.9–10.3)
Chloride: 93 mmol/L — ABNORMAL LOW (ref 98–111)
GFR calc non Af Amer: 5 mL/min — ABNORMAL LOW (ref >60)
GFR, EST AFRICAN AMERICAN: 5 mL/min — AB (ref >60)
Glucose, Bld: 377 mg/dL — ABNORMAL HIGH (ref 70–99)
Potassium: 5.3 mmol/L — ABNORMAL HIGH (ref 3.5–5.1)
Sodium: 130 mmol/L — ABNORMAL LOW (ref 135–145)
Total Bilirubin: 0.5 mg/dL (ref 0.3–1.2)
Total Protein: 6.8 g/dL (ref 6.5–8.1)

## 2018-06-26 LAB — I-STAT CHEM 8, ED
BUN: 38 mg/dL — ABNORMAL HIGH (ref 6–20)
Calcium, Ion: 1.02 mmol/L — ABNORMAL LOW (ref 1.15–1.40)
Chloride: 94 mmol/L — ABNORMAL LOW (ref 98–111)
Creatinine, Ser: 10.6 mg/dL — ABNORMAL HIGH (ref 0.44–1.00)
Glucose, Bld: 344 mg/dL — ABNORMAL HIGH (ref 70–99)
HCT: 34 % — ABNORMAL LOW (ref 36.0–46.0)
Hemoglobin: 11.6 g/dL — ABNORMAL LOW (ref 12.0–15.0)
Potassium: 4.2 mmol/L (ref 3.5–5.1)
Sodium: 130 mmol/L — ABNORMAL LOW (ref 135–145)
TCO2: 25 mmol/L (ref 22–32)

## 2018-06-26 LAB — I-STAT BETA HCG BLOOD, ED (MC, WL, AP ONLY): I-stat hCG, quantitative: <5 m[IU]/mL (ref <5)

## 2018-06-26 LAB — CBG MONITORING, ED: Glucose-Capillary: 344 mg/dL — ABNORMAL HIGH (ref 70–99)

## 2018-06-26 LAB — I-STAT CG4 LACTIC ACID, ED: Lactic Acid, Venous: 0.95 mmol/L (ref 0.5–1.9)

## 2018-06-26 MED ORDER — SODIUM CHLORIDE 0.9 % IV BOLUS: 1000.0000 mL | Freq: Once | INTRAVENOUS | Status: DC

## 2018-06-26 MED ORDER — CINACALCET HCL 30 MG PO TABS: 60.0000 mg | ORAL_TABLET | Freq: Every day | ORAL | Status: DC

## 2018-06-26 MED ORDER — VANCOMYCIN HCL 10 G IV SOLR
1250.0000 mg | INTRAVENOUS | Status: AC
  Administered 2018-06-26: 1250 mg via INTRAVENOUS
  Filled 2018-06-26: qty 1250

## 2018-06-26 MED ORDER — METOPROLOL SUCCINATE ER 25 MG PO TB24
25.0000 mg | ORAL_TABLET | Freq: Every day | ORAL | Status: DC
  Administered 2018-06-27 – 2018-06-28 (×2): 25 mg via ORAL
  Filled 2018-06-26 (×2): qty 1

## 2018-06-26 MED ORDER — ONDANSETRON HCL 4 MG/2ML IJ SOLN: 4.0000 mg | Freq: Four times a day (QID) | INTRAMUSCULAR | Status: DC | PRN

## 2018-06-26 MED ORDER — INSULIN ASPART 100 UNIT/ML ~~LOC~~ SOLN
0.0000 [IU] | SUBCUTANEOUS | Status: DC
  Administered 2018-06-27: 2 [IU] via SUBCUTANEOUS
  Administered 2018-06-27: 5 [IU] via SUBCUTANEOUS
  Filled 2018-06-26 (×2): qty 1

## 2018-06-26 MED ORDER — SODIUM CHLORIDE 0.9 % IV SOLN: 2.0000 g | INTRAVENOUS | Status: DC

## 2018-06-26 MED ORDER — HEPARIN SODIUM (PORCINE) 5000 UNIT/ML IJ SOLN
5000.0000 [IU] | Freq: Three times a day (TID) | INTRAMUSCULAR | Status: DC
  Administered 2018-06-27 – 2018-06-28 (×5): 5000 [IU] via SUBCUTANEOUS
  Filled 2018-06-26 (×7): qty 1

## 2018-06-26 MED ORDER — NORETHINDRONE 0.35 MG PO TABS: 1.0000 | ORAL_TABLET | Freq: Every day | ORAL | Status: DC

## 2018-06-26 MED ORDER — ACETAMINOPHEN 325 MG PO TABS
650.0000 mg | ORAL_TABLET | Freq: Once | ORAL | Status: AC
  Administered 2018-06-26: 650 mg via ORAL
  Filled 2018-06-26: qty 2

## 2018-06-26 MED ORDER — RENA-VITE PO TABS
1.0000 | ORAL_TABLET | Freq: Every day | ORAL | Status: DC
  Administered 2018-06-27 – 2018-06-28 (×2): 1 via ORAL
  Filled 2018-06-26 (×2): qty 1

## 2018-06-26 MED ORDER — VANCOMYCIN HCL IN DEXTROSE 500-5 MG/100ML-% IV SOLN
500.0000 mg | INTRAVENOUS | Status: DC
  Administered 2018-06-27: 500 mg via INTRAVENOUS
  Filled 2018-06-26: qty 100

## 2018-06-26 MED ORDER — CINACALCET HCL 30 MG PO TABS
60.0000 mg | ORAL_TABLET | Freq: Every day | ORAL | Status: DC
  Administered 2018-06-27 – 2018-06-28 (×2): 60 mg via ORAL
  Filled 2018-06-26 (×2): qty 2

## 2018-06-26 MED ORDER — INSULIN ASPART PROT & ASPART (70-30 MIX) 100 UNIT/ML ~~LOC~~ SUSP: 5.0000 [IU] | Freq: Once | SUBCUTANEOUS | Status: DC

## 2018-06-26 MED ORDER — IPRATROPIUM-ALBUTEROL 0.5-2.5 (3) MG/3ML IN SOLN
3.0000 mL | Freq: Once | RESPIRATORY_TRACT | Status: AC
  Administered 2018-06-26: 3 mL via RESPIRATORY_TRACT
  Filled 2018-06-26: qty 3

## 2018-06-26 MED ORDER — SODIUM CHLORIDE 0.9 % IV SOLN
2.0000 g | INTRAVENOUS | Status: DC
  Filled 2018-06-26: qty 2

## 2018-06-26 MED ORDER — INSULIN GLARGINE 100 UNIT/ML ~~LOC~~ SOLN
10.0000 [IU] | Freq: Every day | SUBCUTANEOUS | Status: DC
  Administered 2018-06-27 – 2018-06-28 (×2): 10 [IU] via SUBCUTANEOUS
  Filled 2018-06-26 (×4): qty 0.1

## 2018-06-26 MED ORDER — CALCIUM ACETATE (PHOS BINDER) 667 MG PO CAPS
2001.0000 mg | ORAL_CAPSULE | Freq: Three times a day (TID) | ORAL | Status: DC
  Administered 2018-06-27 – 2018-06-28 (×3): 2001 mg via ORAL
  Filled 2018-06-26 (×5): qty 3

## 2018-06-26 MED ORDER — SODIUM CHLORIDE 0.9 % IV SOLN
1.0000 g | INTRAVENOUS | Status: AC
  Administered 2018-06-26: 1 g via INTRAVENOUS
  Filled 2018-06-26: qty 1

## 2018-06-26 MED ORDER — INSULIN GLARGINE 100 UNIT/ML ~~LOC~~ SOLN
5.0000 [IU] | Freq: Every day | SUBCUTANEOUS | Status: DC
  Filled 2018-06-26: qty 0.05

## 2018-06-26 MED ORDER — SODIUM CHLORIDE 0.9 % IV BOLUS: 500.0000 mL | Freq: Once | INTRAVENOUS | Status: DC

## 2018-06-26 MED ORDER — AMLODIPINE BESYLATE 5 MG PO TABS
5.0000 mg | ORAL_TABLET | Freq: Every day | ORAL | Status: DC
  Administered 2018-06-27: 5 mg via ORAL
  Filled 2018-06-26: qty 1

## 2018-06-26 MED ORDER — INSULIN ASPART 100 UNIT/ML ~~LOC~~ SOLN: 5.0000 [IU] | Freq: Once | SUBCUTANEOUS | Status: DC

## 2018-06-26 NOTE — Progress Notes (Signed)
Pharmacy Antibiotic Note  [name] is a 28 y.o. female with ESRD on HD TTSat presented to the ED on 06/26/2018 with n/v, fever and SOB. CXR show findings with suspicion for PNA.  To start vancomycin and cefepime for infection.   Plan: - cefepime 2 gm qHD - vancomycin 1250 mg IV x1 loading dose, then 500 mg IV qHD  __________________________________  Height: 5\' 1"  (154.9 cm) Weight: 123 lb (55.8 kg) IBW/kg (Calculated) : 47.8  Temp (24hrs), Avg:99.9 F (37.7 C), Min:98.7 F (37.1 C), Max:101.1 F (38.4 C)  Recent Labs  Lab 06/26/18 2058 06/26/18 2154 06/26/18 2155  WBC 8.6  --   --   CREATININE 10.21* 10.60*  --   LATICACIDVEN  --   --  0.95    Estimated Creatinine Clearance: 6 mL/min (A) (by C-G formula based on SCr of 10.6 mg/dL (H)).    No Known Allergies   Thank you for allowing pharmacy to be a part of this patient's care.  Lynelle Doctor 06/26/2018 10:41 PM

## 2018-06-26 NOTE — ED Triage Notes (Signed)
Transported by [facility] from home-- patient reports she has been feeling sick for 2 days +nausea/vomiting, shortness of breath, hyperglycemia and fever (100.8). Patient reports that she has not had any food or insulin today. +dialysis patient (Tues/Thurs/ Sat).

## 2018-06-26 NOTE — H&P (Signed)
History and Physical    [name] SKA:[id number] DOB: December 28, 1990 DOA: 06/26/2018  PCP: Patient, No Pcp Per  Patient coming from: Home  I have personally briefly reviewed patient's old medical records in [place]  Chief Complaint: Cough, hyperglycemia  HPI: [name] is a 28 y.o. female with medical history significant of ESRD on TTS dialysis, DM1, CVA, cardiac arrest.  Patient presents to the ED for evaluation of productive cough, SOB.  Symptoms onset yesterday.  Associated fevers, chills, sore throat, congestion.  N/V, hasnt taken insulin today.  Feels like last year when she had PNA.   ED Course: Influenza PCR neg.  CXR shows R>L ground glass opacity.  Tm 101.1   Review of Systems: As per HPI otherwise 10 point review of systems negative.   Past Medical History:  Diagnosis Date  . Cardiac arrest ([place])   . Complication of anesthesia   . Diabetes mellitus type 1 ([place])    [facility]  . DKA (diabetic ketoacidoses) ([place])   . ESRD (end stage renal disease) ([facility])    TTS HD, dialyzed at [facility]  . Hypertension   . PONV (postoperative nausea and vomiting)   . Stroke [facility])     Past Surgical History:  Procedure Laterality Date  . CATARACT EXTRACTION     6 years ago  . DIALYSIS FISTULA CREATION    . IR FLUORO GUIDE CV LINE RIGHT  01/26/2017  . IR REMOVAL TUN CV CATH W/O FL  05/16/2017  . IR US GUIDE VASC ACCESS RIGHT  01/26/2017     reports that she has never smoked. She has never used smokeless tobacco. She reports that she does not drink alcohol or use drugs.  No Known Allergies  Family History  Problem Relation Age of Onset  . Diabetes Maternal Grandmother   . Stroke Maternal Grandmother   . Cancer Maternal Aunt        aunt, passed last month, ?RCC  . Cancer Maternal Uncle      Prior to Admission medications   Medication Sig Start Date End Date Taking? Authorizing Provider  amLODipine (NORVASC) 5 MG tablet Take 1 tablet  (5 mg total) by mouth daily. Patient taking differently: Take 5 mg by mouth at bedtime.  03/22/18  Yes Ghimire, Henreitta Leber, MD  calcium acetate (PHOSLO) 667 MG capsule Take 3 capsules (2,001 mg total) by mouth 3 (three) times daily with meals. 05/16/17  Yes Rai, Ripudeep K, MD  Chlorpheniramine-Acetaminophen (CORICIDIN HBP COLD/FLU PO) Take 2 tablets by mouth daily as needed (flu).   Yes [provider]  Famotidine 20 MG CHEW Chew 20 mg by mouth as needed for heartburn or indigestion.   Yes [provider]  insulin aspart (NOVOLOG) 100 UNIT/ML injection Inject 0-9 Units into the skin 3 (three) times daily with meals. Patient taking differently: Inject 3 Units into the skin 3 (three) times daily with meals.  01/29/17  Yes Georgette Shell, MD  insulin glargine (LANTUS) 100 UNIT/ML injection Inject 0.22 mLs (22 Units total) into the skin at bedtime. Patient taking differently: Inject 15 Units into the skin at bedtime.  03/22/18  Yes Ghimire, Henreitta Leber, MD  metoprolol succinate (TOPROL-XL) 25 MG 24 hr tablet Take 1 tablet (25 mg total) by mouth daily. Patient taking differently: Take 25 mg by mouth at bedtime.  01/29/17  Yes Georgette Shell, MD  multivitamin (RENA-VIT) TABS tablet Take 1 tablet by mouth at bedtime. 01/29/17  Yes  Georgette Shell, MD  norethindrone (MICRONOR,CAMILA,ERRIN) 0.35 MG tablet Take 1 tablet (0.35 mg total) by mouth daily. 01/29/17  Yes Georgette Shell, MD  ondansetron (ZOFRAN ODT) 4 MG disintegrating tablet Take 1 tablet (4 mg total) by mouth every 8 (eight) hours as needed for nausea or vomiting. 06/12/18  Yes Long, Wonda Olds, MD  SENSIPAR 60 MG tablet Take 60 mg by mouth daily. 03/16/18  Yes [provider]    Physical Exam: Vitals:   06/26/18 2120 06/26/18 2207 06/26/18 2230 06/26/18 2301  BP:  (!) 148/77 (!) 153/93 (!) 142/87  Pulse:  (!) 108 (!) 107 (!) 104  Resp:  19 20 15   Temp:      TempSrc:      SpO2: 98% 97% 98% 98%    Weight:      Height:        Constitutional: NAD, calm, comfortable Eyes: PERRL, lids and conjunctivae normal ENMT: Mucous membranes are moist. Posterior pharynx clear of any exudate or lesions.Normal dentition.  Neck: normal, supple, no masses, no thyromegaly Respiratory: B basilar rhonchi Cardiovascular: Tachycardia  Abdomen: no tenderness, no masses palpated. No hepatosplenomegaly. Bowel sounds positive.  Musculoskeletal: no clubbing / cyanosis. No joint deformity upper and lower extremities. Good ROM, no contractures. Normal muscle tone.  Skin: no rashes, lesions, ulcers. No induration Neurologic: CN 2-12 grossly intact. Sensation intact, DTR normal. Strength 5/5 in all 4.  Psychiatric: Normal judgment and insight. Alert and oriented x 3. Normal mood.    Labs on Admission: I have personally reviewed following labs and imaging studies  CBC: Recent Labs  Lab 06/26/18 2058 06/26/18 2154  WBC 8.6  --   NEUTROABS 7.6  --   HGB 11.1* 11.6*  HCT 36.1 34.0*  MCV 94.8  --   PLT 230  --    Basic Metabolic Panel: Recent Labs  Lab 06/26/18 2058 06/26/18 2154  NA 130* 130*  K 5.3* 4.2  CL 93* 94*  CO2 24  --   GLUCOSE 377* 344*  BUN 42* 38*  CREATININE 10.21* 10.60*  CALCIUM 8.3*  --    GFR: Estimated Creatinine Clearance: 6 mL/min (A) (by C-G formula based on SCr of 10.6 mg/dL (H)). Liver Function Tests: Recent Labs  Lab 06/26/18 2058  AST 28  ALT 24  ALKPHOS 117  BILITOT 0.5  PROT 6.8  ALBUMIN 3.6   No results for input(s): LIPASE, AMYLASE in the last 168 hours. No results for input(s): AMMONIA in the last 168 hours. Coagulation Profile: No results for input(s): INR, PROTIME in the last 168 hours. Cardiac Enzymes: No results for input(s): CKTOTAL, CKMB, CKMBINDEX, TROPONINI in the last 168 hours. BNP (last 3 results) No results for input(s): PROBNP in the last 8760 hours. HbA1C: No results for input(s): HGBA1C in the last 72 hours. CBG: Recent Labs  Lab  06/26/18 1903  GLUCAP 344*   Lipid Profile: No results for input(s): CHOL, HDL, LDLCALC, TRIG, CHOLHDL, LDLDIRECT in the last 72 hours. Thyroid Function Tests: No results for input(s): TSH, T4TOTAL, FREET4, T3FREE, THYROIDAB in the last 72 hours. Anemia Panel: No results for input(s): VITAMINB12, FOLATE, FERRITIN, TIBC, IRON, RETICCTPCT in the last 72 hours. Urine analysis:    Component Value Date/Time   COLORURINE YELLOW 03/21/2018 2109   APPEARANCEUR TURBID (A) 03/21/2018 2109   LABSPEC 1.014 03/21/2018 2109   PHURINE 8.0 03/21/2018 2109   GLUCOSEU >=500 (A) 03/21/2018 2109   HGBUR MODERATE (A) 03/21/2018 2109   BILIRUBINUR NEGATIVE 03/21/2018  2109   [place] NEGATIVE 03/21/2018 2109   PROTEINUR >=300 (A) 03/21/2018 2109   UROBILINOGEN 0.2 04/07/2014 1457   NITRITE NEGATIVE 03/21/2018 2109   LEUKOCYTESUR LARGE (A) 03/21/2018 2109    Radiological Exams on Admission: Dg Chest 2 View  Result Date: 06/26/2018 CLINICAL DATA:  Cough fever congestion EXAM: CHEST - 2 VIEW COMPARISON:  CT 05/13/2018 FINDINGS: No significant effusion. Mild cardiomegaly. Left greater than right diffuse ground-glass opacity. Stent in the left upper extremity. No pneumothorax. Partial consolidation in the left lower lobe. IMPRESSION: 1. Cardiomegaly with central vascular congestion 2. Diffuse left greater than right ground-glass opacity, may reflect edema or diffuse infection. Partial consolidation in the left lower lobe. Electronically Signed   By: Donavan Foil M.D.   On: 06/26/2018 21:17    EKG: Independently reviewed.  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 HCAP - 1. PNA pathway 2. Influenza PCR neg 3. Cultures pending 4. Cefepime / vanc 5. Zofran PRN N/V 2. DM1 - 1. Lantus 10 QHS 2. SSI sensitive Q4H 3. ESRD - 1. Call  nephrology in AM for routine IP dialysis  DVT prophylaxis: Heparin [place] Code Status: Full Family Communication: No family in room Disposition Plan: Home after admit Consults called: None Admission status: Admit to inpatient  Severity of Illness: The appropriate patient status for this patient is INPATIENT. Inpatient status is judged to be reasonable and necessary in order to provide the required intensity of service to ensure the patient's safety. The patient's presenting symptoms, physical exam findings, and initial radiographic and laboratory data in the context of their chronic comorbidities is felt to place them at high risk for further clinical deterioration. Furthermore, it is not anticipated that the patient will be medically stable for discharge from the hospital within 2 midnights of admission. The following factors support the patient status of inpatient.   " The patient's presenting symptoms include Cough, fever. " The worrisome physical exam findings include Tachycardia, fever. " The initial radiographic and laboratory data are worrisome because of PNA. " The chronic co-morbidities include ESRD on TTS dialysis, DM1.   * I certify that at the point of admission it is my clinical judgment that the patient will require inpatient hospital care spanning beyond 2 midnights from the point of admission due to high intensity of service, high risk for further deterioration and high frequency of surveillance required.Etta Quill DO [facility] Pager [phone] Only works nights!  If 7AM-7PM, please contact the primary day team physician taking care of patient  [email or web address] Password [facility]  06/26/2018, 11:29 PM

## 2018-06-26 NOTE — ED Notes (Signed)
Bed: WA17 Expected date:  Expected time:  Means of arrival:  Comments: EMS "ill"/hyperglycemic

## 2018-06-26 NOTE — ED Notes (Signed)
Pt aware urine sample is needed, pt on dialysis and states she will probably be unable to provide one.

## 2018-06-26 NOTE — Progress Notes (Signed)
A consult was received from an ED provider  for Vancomycin and Cefepime per pharmacy dosing.  The patient's profile has been reviewed for ht/wt/allergies/indication/available labs.    A one time order has been placed for Vancomycin 1250mg  IV and Cefepime 1g IV.  Further antibiotics/pharmacy consults should be ordered by admitting physician if indicated.                       Thank you, Luiz Ochoa 06/26/2018  9:46 PM

## 2018-06-26 NOTE — ED Provider Notes (Signed)
[facility] DEPT Provider Note   CSN: [id number] Arrival date & time: 06/26/18  1853     History   Chief Complaint Chief Complaint  Patient presents with  . Hyperglycemia    HPI [name] is a 28 y.o. female.  HPI   Patient is a 28 year old female with a history of type 1 diabetes, ESRD (dialysis t/th/sat) CVA, cardiac arrest, who presents the emergency department today for evaluation of a productive cough, shortness of breath that has been present since yesterday.  She is also had fevers, chills, sore throat and congestion.  States that it feels like when she had pneumonia last year.  States her temp was 100.8 with EMS.  She was also placed on 2 L nasal cannula by EMS.  No lower extremity swelling or pain.  No recent hospital admissions or surgeries.  Will  Past Medical History:  Diagnosis Date  . Cardiac arrest ([place])   . Complication of anesthesia   . Diabetes mellitus type 1 ([place])    2005 dx [facility]  . DKA (diabetic ketoacidoses) ([place])   . ESRD (end stage renal disease) (HCC)    TTS HD, dialyzed at [facility]  . Hypertension   . PONV (postoperative nausea and vomiting)   . Stroke [facility])    Patient Active Problem List   Diagnosis Date Noted  . HCAP (healthcare-associated pneumonia) 06/26/2018  . Dizziness 03/22/2018  . Multifocal pneumonia 05/14/2017  . Normocytic normochromic anemia 01/26/2017  . ESRD (end stage renal disease) ([place]) 01/26/2017  . Fluid overload 01/26/2017  . Depression 07/04/2014  . Hemodialysis-associated hypotension 04/24/2014  . Cerebral infarction due to thrombosis of right middle cerebral artery ([place]) 04/24/2014  . History of anemia 04/17/2014  . Cardiac arrest ([place]) 04/14/2014  . Diabetic neuropathy ([place]) 02/14/2014  . DKA, type 1 ([place]) 02/12/2014  . Tachycardia 02/12/2014  . DOE (dyspnea on exertion) 02/12/2014  . Abdominal pain 02/12/2014  . IDDM (insulin dependent diabetes mellitus) ([place])  02/12/2014  . Type I diabetes mellitus with renal manifestations, uncontrolled ([place]) 12/08/2013  . Hypokalemia 12/07/2013  . Noncompliance 12/06/2013  . DKA (diabetic ketoacidoses) ([place]) 12/05/2013  . Leukocytosis 12/05/2013  . Acute on chronic renal failure ([place]) 12/05/2013  . Hypercalcemia 12/05/2013  . Abnormal LFTs 12/05/2013  . Elevated lipase 12/05/2013    Past Surgical History:  Procedure Laterality Date  . CATARACT EXTRACTION     6 years ago  . DIALYSIS FISTULA CREATION    . IR FLUORO GUIDE CV LINE RIGHT  01/26/2017  . IR REMOVAL TUN CV CATH W/O FL  05/16/2017  . IR US GUIDE VASC ACCESS RIGHT  01/26/2017     OB History   No obstetric history on file.      Home Medications    Prior to Admission medications   Medication Sig Start Date End Date Taking? Authorizing Provider  amLODipine (NORVASC) 5 MG tablet Take 1 tablet (5 mg total) by mouth daily. Patient taking differently: Take 5 mg by mouth at bedtime.  03/22/18  Yes Ghimire, Henreitta Leber, MD  calcium acetate (PHOSLO) 667 MG capsule Take 3 capsules (2,001 mg total) by mouth 3 (three) times daily with meals. 05/16/17  Yes Rai, Ripudeep K, MD  Chlorpheniramine-Acetaminophen (CORICIDIN HBP COLD/FLU PO) Take 2 tablets by mouth daily as needed (flu).   Yes [provider]  Famotidine 20 MG CHEW Chew 20 mg by mouth as needed for heartburn or indigestion.   Yes [provider]  insulin aspart (NOVOLOG) 100 UNIT/ML injection Inject 0-9 Units into the skin 3 (three) times daily with meals. Patient taking differently: Inject 3 Units into the skin 3 (three) times daily with meals.  01/29/17  Yes Georgette Shell, MD  insulin glargine (LANTUS) 100 UNIT/ML injection Inject 0.22 mLs (22 Units total) into the skin at bedtime. Patient taking differently: Inject 15 Units into the skin at bedtime.  03/22/18  Yes Ghimire, Henreitta Leber, MD  metoprolol succinate (TOPROL-XL) 25 MG 24 hr tablet Take 1 tablet (25 mg total) by mouth  daily. Patient taking differently: Take 25 mg by mouth at bedtime.  01/29/17  Yes Georgette Shell, MD  multivitamin (RENA-VIT) TABS tablet Take 1 tablet by mouth at bedtime. 01/29/17  Yes Georgette Shell, MD  norethindrone (MICRONOR,CAMILA,ERRIN) 0.35 MG tablet Take 1 tablet (0.35 mg total) by mouth daily. 01/29/17  Yes Georgette Shell, MD  ondansetron (ZOFRAN ODT) 4 MG disintegrating tablet Take 1 tablet (4 mg total) by mouth every 8 (eight) hours as needed for nausea or vomiting. 06/12/18  Yes Long, Wonda Olds, MD  SENSIPAR 60 MG tablet Take 60 mg by mouth daily. 03/16/18  Yes [provider]    Family History Family History  Problem Relation Age of Onset  . Diabetes Maternal Grandmother   . Stroke Maternal Grandmother   . Cancer Maternal Aunt        aunt, passed last month, ?RCC  . Cancer Maternal Uncle     Social History Social History   Tobacco Use  . Smoking status: Never Smoker  . Smokeless tobacco: Never Used  Substance Use Topics  . Alcohol use: No    Alcohol/week: 0.0 standard drinks  . Drug use: No     Allergies   Patient has no known allergies.   Review of Systems Review of Systems  Constitutional: Positive for chills, diaphoresis and fever.  HENT: Positive for congestion, rhinorrhea and sore throat.   Eyes: Negative for visual disturbance.  Respiratory: Positive for cough and shortness of breath.   Cardiovascular: Negative for chest pain.  Gastrointestinal: Positive for nausea and vomiting. Negative for abdominal pain, constipation and diarrhea.  Genitourinary: Negative for dysuria.  Musculoskeletal: Negative for back pain.  Skin: Negative for rash.  Neurological: Positive for headaches. Negative for weakness and numbness.     Physical Exam Updated Vital Signs BP (!) 153/93   Pulse (!) 107   Temp (!) 101.1 F (38.4 C) (Rectal)   Resp 20   Ht 5\' 1"  (1.549 m)   Wt 55.8 kg   LMP 06/26/2018   SpO2 98%   BMI 23.24 kg/m    Physical Exam Vitals signs and nursing note reviewed.  Constitutional:      General: She is not in acute distress.    Appearance: She is well-developed.  HENT:     Head: Normocephalic and atraumatic.     Nose: Congestion present.     Mouth/Throat:     Mouth: Mucous membranes are dry.     Pharynx: No oropharyngeal exudate or posterior oropharyngeal erythema.  Eyes:     Extraocular Movements: Extraocular movements intact.     Conjunctiva/sclera: Conjunctivae normal.     Pupils: Pupils are equal, round, and reactive to light.  Neck:     Musculoskeletal: Neck supple.  Cardiovascular:     Rate and Rhythm: Regular rhythm. Tachycardia present.     Heart sounds: Normal heart sounds. No murmur.  Pulmonary:     Effort: Pulmonary  effort is normal. No respiratory distress.     Comments: Crackles to bilat lower lobes. Speaking in full sentences Abdominal:     General: Bowel sounds are normal.     Palpations: Abdomen is soft.     Tenderness: There is no abdominal tenderness. There is no guarding or rebound.  Musculoskeletal: Normal range of motion.     Right lower leg: No edema.     Left lower leg: No edema.  Skin:    General: Skin is warm and dry.  Neurological:     Mental Status: She is alert.  Psychiatric:        Mood and Affect: Mood normal.    ED Treatments / Results  Labs (all labs ordered are listed, but only abnormal results are displayed) Labs Reviewed  CBC WITH DIFFERENTIAL/PLATELET - Abnormal; Notable for the following components:      Result Value   RBC 3.81 (*)    Hemoglobin 11.1 (*)    Lymphs Abs 0.5 (*)    All other components within normal limits  COMPREHENSIVE METABOLIC PANEL - Abnormal; Notable for the following components:   Sodium 130 (*)    Potassium 5.3 (*)    Chloride 93 (*)    Glucose, Bld 377 (*)    BUN 42 (*)    Creatinine, Ser 10.21 (*)    Calcium 8.3 (*)    GFR calc non Af Amer 5 (*)    GFR calc Af Amer 5 (*)    All other components within  normal limits  CBG MONITORING, ED - Abnormal; Notable for the following components:   Glucose-Capillary 344 (*)    All other components within normal limits  I-STAT CHEM 8, ED - Abnormal; Notable for the following components:   Sodium 130 (*)    Chloride 94 (*)    BUN 38 (*)    Creatinine, Ser 10.60 (*)    Glucose, Bld 344 (*)    Calcium, Ion 1.02 (*)    Hemoglobin 11.6 (*)    HCT 34.0 (*)    All other components within normal limits  CULTURE, BLOOD (ROUTINE X 2)  CULTURE, BLOOD (ROUTINE X 2)  EXPECTORATED SPUTUM ASSESSMENT W REFEX TO RESP CULTURE  GRAM STAIN  INFLUENZA PANEL BY PCR (TYPE A & B)  URINALYSIS, ROUTINE W REFLEX MICROSCOPIC  HIV ANTIBODY (ROUTINE TESTING W REFLEX)  STREP PNEUMONIAE URINARY ANTIGEN  CBC  BASIC METABOLIC PANEL  I-STAT BETA HCG BLOOD, ED ([facility], [facility], AP ONLY)  I-STAT CG4 LACTIC ACID, ED    EKG None  Radiology Dg Chest 2 View  Result Date: 06/26/2018 CLINICAL DATA:  Cough fever congestion EXAM: CHEST - 2 VIEW COMPARISON:  CT 05/13/2018 FINDINGS: No significant effusion. Mild cardiomegaly. Left greater than right diffuse ground-glass opacity. Stent in the left upper extremity. No pneumothorax. Partial consolidation in the left lower lobe. IMPRESSION: 1. Cardiomegaly with central vascular congestion 2. Diffuse left greater than right ground-glass opacity, may reflect edema or diffuse infection. Partial consolidation in the left lower lobe. Electronically Signed   By: Donavan Foil M.D.   On: 06/26/2018 21:17    Procedures Procedures (including critical care time) CRITICAL CARE Performed by: Rodney Booze   Total critical care time: 39 minutes  Critical care time was exclusive of separately billable procedures and treating other patients.  Critical care was necessary to treat or prevent imminent or life-threatening deterioration.  Critical care was time spent personally by me on the following activities: development of treatment  plan with patient  and/or surrogate as well as nursing, discussions with consultants, evaluation of patient's response to treatment, examination of patient, obtaining history from patient or surrogate, ordering and performing treatments and interventions, ordering and review of laboratory studies, ordering and review of radiographic studies, pulse oximetry and re-evaluation of patient's condition.   Medications Ordered in ED Medications  vancomycin (VANCOCIN) 1,250 mg in sodium chloride 0.9 % 250 mL IVPB (1,250 mg Intravenous New Bag/Given 06/26/18 2235)  insulin aspart (novoLOG) injection 0-9 Units (has no administration in time range)  heparin injection 5,000 Units (has no administration in time range)  calcium acetate (PHOSLO) capsule 2,001 mg (has no administration in time range)  amLODipine (NORVASC) tablet 5 mg (has no administration in time range)  insulin glargine (LANTUS) injection 10 Units (has no administration in time range)  metoprolol succinate (TOPROL-XL) 24 hr tablet 25 mg (has no administration in time range)  multivitamin (RENA-VIT) tablet 1 tablet (has no administration in time range)  norethindrone (MICRONOR,CAMILA,ERRIN) 0.35 MG tablet 0.35 mg (has no administration in time range)  cinacalcet (SENSIPAR) tablet 60 mg (has no administration in time range)  ondansetron (ZOFRAN) injection 4 mg (has no administration in time range)  ipratropium-albuterol (DUONEB) 0.5-2.5 (3) MG/3ML nebulizer solution 3 mL (3 mLs Nebulization Given 06/26/18 2120)  acetaminophen (TYLENOL) tablet 650 mg (650 mg Oral Given 06/26/18 2118)  ceFEPIme (MAXIPIME) 1 g in sodium chloride 0.9 % 100 mL IVPB (0 g Intravenous Stopped 06/26/18 2236)     Initial Impression / Assessment and Plan / ED Course  I have reviewed the triage vital signs and the nursing notes.  Pertinent labs & imaging results that were available during my care of the patient were reviewed by me and considered in my medical decision making (see chart for  details).     Final Clinical Impressions(s) / ED Diagnoses   Final diagnoses:  Community acquired pneumonia, unspecified laterality  Hyperglycemia   Patient presents emergency department today complaining of URI symptoms including cough.  She is found to be febrile and tachycardic.  She is also found to be hypoxic satting at 92% on room air.  She is placed on 2 L nasal cannula.  She is maintaining her blood pressure.  Code sepsis was initiated and patient was given antibiotics to cover suspected pneumonia.  Fluids were held in setting of ESRD on dialysis, also with crackles to bilateral lateral lungs and concern for pulmonary vascular congestion.  CBC without leukocytosis.  Does have slight anemia.  CMP with mild hyponatremia, slightly elevated potassium at 5.3, low chloride at 93.  Her glucose is elevated at 377.  BUN elevated 42 and creatinine elevated 10.21.  States she is not missed any dialysis sessions this week.  She is due for dialysis tomorrow.  No elevated anion gap.  Normal bicarb.  Unable to obtain UA given her history.  Flu was negative.  Lactic acid negative.  Beta hCG negative.  Chest x-ray shows cardiomegaly with central vascular congestion 2. Diffuse left greater than right ground-glass opacity, may reflect edema or diffuse infection. Partial consolidation in the left lower lobe.  Will require admission for further treatment of her upper glycemia and sepsis secondary to pneumonia.    10:30 PM Re-evaluated pt. HR 108 on monitor. BP 329J systolic. Satting well on 2L. Pt ports some improvement after nebulizer treatment.  Discussed findings and plan for admission.  She is agreeable to this.  Case discussed with Dr. Fabio Neighbors who accepts patient for admission.  ED Discharge Orders    None       Bishop Dublin 06/26/18 2250    Tegeler, Gwenyth Allegra, MD 06/27/18 [id number]

## 2018-06-27 ENCOUNTER — Other Ambulatory Visit: Payer: Self-pay

## 2018-06-27 ENCOUNTER — Encounter (HOSPITAL_COMMUNITY): Payer: Self-pay | Admitting: General Practice

## 2018-06-27 DIAGNOSIS — J181 Lobar pneumonia, unspecified organism: Principal | ICD-10-CM

## 2018-06-27 LAB — CBC
HCT: 30.1 % — ABNORMAL LOW (ref 36.0–46.0)
Hemoglobin: 9.3 g/dL — ABNORMAL LOW (ref 12.0–15.0)
MCH: 29.6 pg (ref 26.0–34.0)
MCHC: 30.9 g/dL (ref 30.0–36.0)
MCV: 95.9 fL (ref 80.0–100.0)
Platelets: 200 10*3/uL (ref 150–400)
RBC: 3.14 MIL/uL — AB (ref 3.87–5.11)
RDW: 13.8 % (ref 11.5–15.5)
WBC: 8.2 10*3/uL (ref 4.0–10.5)
nRBC: 0 % (ref 0.0–0.2)

## 2018-06-27 LAB — BASIC METABOLIC PANEL WITH GFR
Anion gap: 12 (ref 5–15)
BUN: 46 mg/dL — ABNORMAL HIGH (ref 6–20)
CO2: 26 mmol/L (ref 22–32)
Calcium: 7.8 mg/dL — ABNORMAL LOW (ref 8.9–10.3)
Chloride: 97 mmol/L — ABNORMAL LOW (ref 98–111)
Creatinine, Ser: 11.78 mg/dL — ABNORMAL HIGH (ref 0.44–1.00)
GFR calc Af Amer: 5 mL/min — ABNORMAL LOW
GFR calc non Af Amer: 4 mL/min — ABNORMAL LOW
Glucose, Bld: 92 mg/dL (ref 70–99)
Potassium: 4.1 mmol/L (ref 3.5–5.1)
Sodium: 135 mmol/L (ref 135–145)

## 2018-06-27 LAB — HEMOGLOBIN A1C
HEMOGLOBIN A1C: 9 % — AB (ref 4.8–5.6)
Mean Plasma Glucose: 211.6 mg/dL

## 2018-06-27 LAB — GLUCOSE, CAPILLARY
GLUCOSE-CAPILLARY: 88 mg/dL (ref 70–99)
Glucose-Capillary: 176 mg/dL — ABNORMAL HIGH (ref 70–99)
Glucose-Capillary: 242 mg/dL — ABNORMAL HIGH (ref 70–99)

## 2018-06-27 LAB — CBG MONITORING, ED
Glucose-Capillary: 119 mg/dL — ABNORMAL HIGH (ref 70–99)
Glucose-Capillary: 172 mg/dL — ABNORMAL HIGH (ref 70–99)
Glucose-Capillary: 272 mg/dL — ABNORMAL HIGH (ref 70–99)
Glucose-Capillary: 63 mg/dL — ABNORMAL LOW (ref 70–99)
Glucose-Capillary: 92 mg/dL (ref 70–99)
Glucose-Capillary: 99 mg/dL (ref 70–99)

## 2018-06-27 LAB — I-STAT CG4 LACTIC ACID, ED: Lactic Acid, Venous: 1.16 mmol/L (ref 0.5–1.9)

## 2018-06-27 MED ORDER — PENTAFLUOROPROP-TETRAFLUOROETH EX AERO: 1.0000 "application " | INHALATION_SPRAY | CUTANEOUS | Status: DC | PRN

## 2018-06-27 MED ORDER — SODIUM CHLORIDE 0.9 % IV SOLN
2.0000 g | INTRAVENOUS | Status: DC
  Administered 2018-06-28: 2 g via INTRAVENOUS
  Filled 2018-06-27: qty 2

## 2018-06-27 MED ORDER — VANCOMYCIN HCL IN DEXTROSE 500-5 MG/100ML-% IV SOLN
INTRAVENOUS | Status: AC
  Administered 2018-06-27: 500 mg via INTRAVENOUS
  Filled 2018-06-27: qty 100

## 2018-06-27 MED ORDER — ACETAMINOPHEN 325 MG PO TABS
650.0000 mg | ORAL_TABLET | ORAL | Status: DC | PRN
  Administered 2018-06-27 – 2018-06-28 (×2): 650 mg via ORAL
  Filled 2018-06-27: qty 2

## 2018-06-27 MED ORDER — AMLODIPINE BESYLATE 10 MG PO TABS
10.0000 mg | ORAL_TABLET | Freq: Every day | ORAL | Status: DC
  Administered 2018-06-28: 10 mg via ORAL
  Filled 2018-06-27: qty 1

## 2018-06-27 MED ORDER — GUAIFENESIN ER 600 MG PO TB12
600.0000 mg | ORAL_TABLET | Freq: Two times a day (BID) | ORAL | Status: DC
  Administered 2018-06-27 – 2018-06-28 (×3): 600 mg via ORAL
  Filled 2018-06-27 (×3): qty 1

## 2018-06-27 MED ORDER — VANCOMYCIN HCL IN DEXTROSE 500-5 MG/100ML-% IV SOLN
INTRAVENOUS | Status: AC
  Filled 2018-06-27: qty 100

## 2018-06-27 MED ORDER — SODIUM CHLORIDE 0.9 % IV SOLN: 100.0000 mL | INTRAVENOUS | Status: DC | PRN

## 2018-06-27 MED ORDER — LORATADINE 10 MG PO TABS
10.0000 mg | ORAL_TABLET | Freq: Once | ORAL | Status: AC
  Administered 2018-06-27: 10 mg via ORAL
  Filled 2018-06-27: qty 1

## 2018-06-27 MED ORDER — CHLORHEXIDINE GLUCONATE CLOTH 2 % EX PADS: 6.0000 | MEDICATED_PAD | Freq: Every day | CUTANEOUS | Status: DC

## 2018-06-27 MED ORDER — ACETAMINOPHEN 325 MG PO TABS
ORAL_TABLET | ORAL | Status: AC
  Filled 2018-06-27: qty 2

## 2018-06-27 MED ORDER — HEPARIN SODIUM (PORCINE) 1000 UNIT/ML DIALYSIS: 1800.0000 [IU] | INTRAMUSCULAR | Status: DC | PRN

## 2018-06-27 MED ORDER — LIDOCAINE-PRILOCAINE 2.5-2.5 % EX CREA: 1.0000 "application " | TOPICAL_CREAM | CUTANEOUS | Status: DC | PRN

## 2018-06-27 MED ORDER — SALINE SPRAY 0.65 % NA SOLN
1.0000 | Freq: Once | NASAL | Status: AC
  Administered 2018-06-27: 1 via NASAL
  Filled 2018-06-27: qty 44

## 2018-06-27 MED ORDER — INSULIN ASPART 100 UNIT/ML ~~LOC~~ SOLN
0.0000 [IU] | Freq: Three times a day (TID) | SUBCUTANEOUS | Status: DC
  Administered 2018-06-28: 1 [IU] via SUBCUTANEOUS
  Administered 2018-06-28: 9 [IU] via SUBCUTANEOUS

## 2018-06-27 NOTE — ED Notes (Signed)
Ulice Dash, RN notified of pts repeat CBG of 99.

## 2018-06-27 NOTE — ED Notes (Signed)
Pt called out again stating that she feels like her blood sugar is low. Pt made aware CBG was 92. Pt given crackers and sprite per request

## 2018-06-27 NOTE — ED Notes (Signed)
[facility] called for transportation 

## 2018-06-27 NOTE — ED Notes (Addendum)
Patient ambulated without difficulty to restroom. Provided cup to obtain urine specimen. Patient did not produce urine. Patient is a dialysis patient.

## 2018-06-27 NOTE — Progress Notes (Signed)
PROGRESS NOTE  [name] CBU:[id number] DOB: 08-Mar-1991 DOA: 06/26/2018 PCP: Patient, No Pcp Per  Brief History   28 year old woman PMH ESRD on HD, diabetes mellitus type 1, CVA presented with shortness of breath, productive cough, vomiting.  Admitted for pneumonia  A & P  Left lower lobe pneumonia.  Influenza PCR negative. --Febrile last evening.  Hemodynamics stable.  Still short of breath.  --Continue empiric antibiotics  Diabetes mellitus type 1 --CBG somewhat labile.  No lows. --Continue sliding scale insulin, Lantus  ESRD on HD TTS --no evidence of volume overload.  Chest x-ray no pulmonary edema.  Potassium within normal limits. --Routine nephrology consultation for HD.  Awaiting transfer to [facility].   Not back to baseline, had fever on admission, remains short of breath with cough.  Given comorbidities including diabetes mellitus and ESRD at risk for decompensation.  Continue current care.  DVT prophylaxis: heparin Code Status: Full Family Communication: none Disposition Plan: home    Murray Hodgkins, MD  [facility] Direct contact: [phone] --Via [place]  --[email or web address]; password [email or web address]  7PM-7AM contact night coverage as above 06/27/2018, 11:08 AM  LOS: 1 day   Consultants  . Nephrology  Procedures  .   Antibiotics  . Cefepime 1/7 . Vancomycin 1/7  Interval History/Subjective  Feels better though still short of breath and has a cough.  No vomiting currently.  Objective   Vitals:  Vitals:   06/27/18 0815 06/27/18 0830  BP:  (!) 146/84  Pulse: 90 88  Resp: 10 15  Temp:    SpO2: 100% 100%    Exam:  Constitutional:  . Appears calm, ill but nontoxic. Eyes:  . pupils and irises appear normal ENMT:  . grossly normal hearing  . Lips appear normal Respiratory:  . CTA bilaterally, no w/r/r.  Decreased breath sounds. Marland Kitchen Respiratory effort mildly increased.  Cardiovascular:  . RRR, no m/r/g . No LE extremity edema     Abdomen:  . Soft, nontender, nondistended Musculoskeletal:  . Digits/nails BUE: no clubbing, cyanosis, petechiae, infection Skin:  . No rashes, lesions, ulcers noted Psychiatric:  . Mental status o Mood, affect appropriate  I have personally reviewed the following:   Today's Data  . Potassium within normal limits.  WBC 8.2.  Platelets 200.  Hemoglobin 9.3.  Lab Data  . Lactic acid within normal limits . Influenza negative . Urine pregnancy negative  Micro Data  . Blood culture pending  Imaging  . 1/6 chest x-ray independently reviewed, left lower lobe consolidation  Cardiology Data  . EKG showed sinus tachycardia, no acute changes  Other Data  .   Scheduled Meds: . amLODipine  5 mg Oral QHS  . calcium acetate  2,001 mg Oral TID WC  . cinacalcet  60 mg Oral Q breakfast  . heparin  5,000 Units Subcutaneous Q8H  . insulin aspart  0-9 Units Subcutaneous TID WC  . insulin glargine  10 Units Subcutaneous QHS  . metoprolol succinate  25 mg Oral QHS  . multivitamin  1 tablet Oral QHS  . norethindrone  1 tablet Oral Daily  . vancomycin  500 mg Intravenous Q T,Th,Sa-HD   Continuous Infusions: . ceFEPime (MAXIPIME) IV      Principal Problem:   Lobar pneumonia (HCC) Active Problems:   Type I diabetes mellitus with renal manifestations, uncontrolled ([place])   ESRD (end stage renal disease) ([place])   LOS: 1 day    Time approximately 9--935.  Review of chart, interview and examination  of patient, discussion of treatment plan.

## 2018-06-27 NOTE — ED Notes (Signed)
Patient states her nose is congested-pharmacy called to send Claritin and saline nasal spray.

## 2018-06-27 NOTE — ED Notes (Signed)
Patient given juice and sandwich for low CBG, lunch trays have not arrived.

## 2018-06-27 NOTE — ED Notes (Signed)
Pt ambulated to BR by staff member who was unaware that a urine sample was needed. Pt did not provide urine specimen at time of using BR. RN, [facility] aware.

## 2018-06-27 NOTE — Progress Notes (Addendum)
   Patient seen and evaluated, chart reviewed, please see EMR for updated orders. Please see full Progress note dictated by admitting physician Dr. Sarajane Jews for same date of service.   Patient seen in room 5M12 at [facility] --- transferred over from [facility] on 06/27/2018 due to need for HD  Lt Arm AVF with +ve Thrill and Bruit  1)LLL PNA in an HD Patient--- cough and shortness of breath persist, Vanco, cefepime, supplemental oxygen and mucolytics/bronchodilators as ordered  2)ESRD--- hemodialysis since 01/2017, next HD 06/24/2018, usual schedule is Tuesday started on Saturdays.... Consulted Dr. Roney Jaffe from nephrology service to initiate HD----patient appears compensated at this time  3)DM1--- A1c was 9.1 about 14 months ago,  no recent A1c available,  continue insulin regimen patient with some low blood sugars earlier   Patient seen and evaluated, chart reviewed, please see EMR for updated orders. Please see full Progress note dictated by admitting physician Dr. Sarajane Jews for same date of service.    Roxan Hockey, MD

## 2018-06-27 NOTE — Consult Note (Addendum)
[facility] Renal Consultation Note    Indication for Consultation:  Management of ESRD/hemodialysis; anemia, hypertension/volume and secondary hyperparathyroidism  OIZ:[id number], No Pcp Per  HPI: [name] is a 28 y.o. female. 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  Past medical history significant for HTN, DMT1, CVA and cardiac arrest.  Of note patient is compliant with prescribed dialysis regimen.  She completed her last HD on 06/24/18 as scheduled.  Treatments have been tolerated well without complications.  Patient seen and examined at bedside.  Reports sore throat, cough and congestion starting on Sunday, which progressed to SOB, fever, chills, n/v yesterday, so she decided to come to the ED.  She felt her symptoms and breathing were similar to last year when she had pneumonia.  States breathing has now improved.    Pertinent findings include Tmax 101.86F, flu negative, hypertension, and CXR showing diffuse  L>R ground-glass opacity, indicating edema vs diffuse infection and a partial consolidation in LLL. Patient has been admitted for further management.   Past Medical History:  Diagnosis Date  . Cardiac arrest ([place])   . Complication of anesthesia   . Diabetes mellitus type 1 ([place])    2005 dx [facility]  . DKA (diabetic ketoacidoses) ([place])   . ESRD (end stage renal disease) ([facility])    TTS HD, dialyzed at [facility]  . Hypertension   . PONV (postoperative nausea and vomiting)   . Stroke [facility])    Past Surgical History:  Procedure Laterality Date  . CATARACT EXTRACTION     6 years ago  . DIALYSIS FISTULA CREATION    . IR FLUORO GUIDE CV LINE RIGHT  01/26/2017  . IR REMOVAL TUN CV CATH W/O FL  05/16/2017  . IR US GUIDE VASC ACCESS RIGHT  01/26/2017   Family History  Problem Relation Age of Onset  . Diabetes Maternal Grandmother   . Stroke Maternal Grandmother   . Cancer Maternal Aunt        aunt, passed last month, ?RCC  . Cancer  Maternal Uncle    Social History:  reports that she has never smoked. She has never used smokeless tobacco. She reports that she does not drink alcohol or use drugs. No Known Allergies Prior to Admission medications   Medication Sig Start Date End Date Taking? Authorizing Provider  amLODipine (NORVASC) 5 MG tablet Take 1 tablet (5 mg total) by mouth daily. Patient taking differently: Take 5 mg by mouth at bedtime.  03/22/18  Yes Ghimire, Henreitta Leber, MD  calcium acetate (PHOSLO) 667 MG capsule Take 3 capsules (2,001 mg total) by mouth 3 (three) times daily with meals. 05/16/17  Yes Rai, Ripudeep K, MD  Chlorpheniramine-Acetaminophen (CORICIDIN HBP COLD/FLU PO) Take 2 tablets by mouth daily as needed (flu).   Yes [provider]  Famotidine 20 MG CHEW Chew 20 mg by mouth as needed for heartburn or indigestion.   Yes [provider]  insulin aspart (NOVOLOG) 100 UNIT/ML injection Inject 0-9 Units into the skin 3 (three) times daily with meals. Patient taking differently: Inject 3 Units into the skin 3 (three) times daily with meals.  01/29/17  Yes Georgette Shell, MD  insulin glargine (LANTUS) 100 UNIT/ML injection Inject 0.22 mLs (22 Units total) into the skin at bedtime. Patient taking differently: Inject 15 Units into the skin at bedtime.  03/22/18  Yes Ghimire, Henreitta Leber, MD  metoprolol succinate (TOPROL-XL) 25 MG 24 hr tablet Take 1  tablet (25 mg total) by mouth daily. Patient taking differently: Take 25 mg by mouth at bedtime.  01/29/17  Yes Georgette Shell, MD  multivitamin (RENA-VIT) TABS tablet Take 1 tablet by mouth at bedtime. 01/29/17  Yes Georgette Shell, MD  norethindrone (MICRONOR,CAMILA,ERRIN) 0.35 MG tablet Take 1 tablet (0.35 mg total) by mouth daily. 01/29/17  Yes Georgette Shell, MD  ondansetron (ZOFRAN ODT) 4 MG disintegrating tablet Take 1 tablet (4 mg total) by mouth every 8 (eight) hours as needed for nausea or vomiting. 06/12/18  Yes Long,  Wonda Olds, MD  SENSIPAR 60 MG tablet Take 60 mg by mouth daily. 03/16/18  Yes [provider]   Current Facility-Administered Medications  Medication Dose Route Frequency Provider Last Rate Last Dose  . amLODipine (NORVASC) tablet 5 mg  5 mg Oral QHS Jennette Kettle M, DO   5 mg at 06/27/18 0008  . calcium acetate (PHOSLO) capsule 2,001 mg  2,001 mg Oral TID WC Jennette Kettle M, DO   2,001 mg at 06/27/18 0845  . ceFEPIme (MAXIPIME) 2 g in sodium chloride 0.9 % 100 mL IVPB  2 g Intravenous Q T,Th,Sa-HD Pham, Anh P, RPH      . [START ON 06/28/2018] Chlorhexidine Gluconate Cloth 2 % PADS 6 each  6 each Topical Q0600 Penninger, [place], [place]      . cinacalcet (SENSIPAR) tablet 60 mg  60 mg Oral Q breakfast Jennette Kettle M, DO   60 mg at 06/27/18 0845  . guaiFENesin (MUCINEX) 12 hr tablet 600 mg  600 mg Oral BID Emokpae, Courage, MD   600 mg at 06/27/18 1451  . heparin injection 5,000 Units  5,000 Units Subcutaneous Q8H Jennette Kettle M, DO   5,000 Units at 06/27/18 1451  . insulin aspart (novoLOG) injection 0-9 Units  0-9 Units Subcutaneous TID WC Samuella Cota, MD      . insulin glargine (LANTUS) injection 10 Units  10 Units Subcutaneous QHS Etta Quill, DO   10 Units at 06/27/18 0009  . metoprolol succinate (TOPROL-XL) 24 hr tablet 25 mg  25 mg Oral QHS Jennette Kettle M, DO   25 mg at 06/27/18 0008  . multivitamin (RENA-VIT) tablet 1 tablet  1 tablet Oral QHS Etta Quill, DO   1 tablet at 06/27/18 0008  . norethindrone (MICRONOR,CAMILA,ERRIN) 0.35 MG tablet 0.35 mg  1 tablet Oral Daily Alcario Drought, Jared M, DO      . ondansetron [facility]) injection 4 mg  4 mg Intravenous Q6H PRN Etta Quill, DO      . vancomycin (VANCOCIN) IVPB 500 mg/100 ml premix  500 mg Intravenous Q T,Th,Sa-HD Lynelle Doctor, [facility]       Labs: Basic Metabolic Panel: Recent Labs  Lab 06/26/18 2058 06/26/18 2154 06/27/18 0423  NA 130* 130* 135  K 5.3* 4.2 4.1  CL 93* 94* 97*  CO2 24  --  26  GLUCOSE 377*  344* 92  BUN 42* 38* 46*  CREATININE 10.21* 10.60* 11.78*  CALCIUM 8.3*  --  7.8*   Liver Function Tests: Recent Labs  Lab 06/26/18 2058  AST 28  ALT 24  ALKPHOS 117  BILITOT 0.5  PROT 6.8  ALBUMIN 3.6   No results for input(s): LIPASE, AMYLASE in the last 168 hours. No results for input(s): AMMONIA in the last 168 hours. CBC: Recent Labs  Lab 06/26/18 2058 06/26/18 2154 06/27/18 0423  WBC 8.6  --  8.2  NEUTROABS 7.6  --   --  HGB 11.1* 11.6* 9.3*  HCT 36.1 34.0* 30.1*  MCV 94.8  --  95.9  PLT 230  --  200   Cardiac Enzymes: No results for input(s): CKTOTAL, CKMB, CKMBINDEX, TROPONINI in the last 168 hours. CBG: Recent Labs  Lab 06/27/18 0416 06/27/18 0828 06/27/18 1220 06/27/18 1309 06/27/18 1343  GLUCAP 92 172* 63* 99 88   Iron Studies: No results for input(s): IRON, TIBC, TRANSFERRIN, FERRITIN in the last 72 hours. Studies/Results: Dg Chest 2 View  Result Date: 06/26/2018 CLINICAL DATA:  Cough fever congestion EXAM: CHEST - 2 VIEW COMPARISON:  CT 05/13/2018 FINDINGS: No significant effusion. Mild cardiomegaly. Left greater than right diffuse ground-glass opacity. Stent in the left upper extremity. No pneumothorax. Partial consolidation in the left lower lobe. IMPRESSION: 1. Cardiomegaly with central vascular congestion 2. Diffuse left greater than right ground-glass opacity, may reflect edema or diffuse infection. Partial consolidation in the left lower lobe. Electronically Signed   By: Donavan Foil M.D.   On: 06/26/2018 21:17    ROS: All others negative except those listed in HPI.  Physical Exam: Vitals:   06/27/18 1200 06/27/18 1227 06/27/18 1230 06/27/18 1341  BP: (!) 158/97  (!) 150/96 (!) 167/96  Pulse: 85  85 87  Resp:   16 16  Temp:  98.5 F (36.9 C)  98.5 F (36.9 C)  TempSrc:  Oral  Oral  SpO2: 99%  100% 100%  Weight:      Height:         General: WDWN, NAD, pleasant female, laying in bed Head: NCAT sclera not icteric MMM Neck: Supple.  No lymphadenopathy Lungs: mostly CTA bilaterally, +crackles in LLL, BS decreased. No wheeze or rhonchi. Breathing is unlabored on 2L via . Heart: RRR. No murmur, rubs or gallops.  Abdomen: soft, nontender, +BS, no guarding, no rebound tenderness M/S:  Equal strength b/l in upper and lower extremities.  Lower extremities:no edema, ischemic changes, or open wounds  Neuro: AAOx3. Moves all extremities spontaneously. Psych:  Responds to questions appropriately with a normal affect. Dialysis Access: LU AVF +b/t  Dialysis Orders:   TTS - [facility]  4hrs, BFR 400, DFR 800,  EDW 57kg, 2K/ 3.5Ca  Access: LU AVF  Heparin 1800 unit bolus Aranesp 100 mcg q2wks - last 06/24/18 Hectorol 30mcg IV qHD   Assessment/Plan: 1.  LLL PNA - CXR showed b/l infiltrates L>R, Flu neg, Tmax last 24hrs 101.7F. On ABX, BCx2 w/NGTD. Per primary  2.  ESRD -  On HD TTS.  K 4.1. No urgent indication for HD at this time.  Volume appears stable. Orders written for HD today or 1st shift tomorrow if unable to complete today.  3.  Hypertension/volume  - BP elevated.  Have been adjusting meds as OP for better control, ^amlodipine to 10mg  qd last week.  Does not appear grossly volume overloaded on exam.  CXR showed possible pulmonary edema vs pneumonia.  If weights correct under EDW.  Will titrate down volume as tolerated.  4.  Anemia of CKD - Hgb 9.3. Due for ESA on Sat.  Follow trends 5.  Secondary Hyperparathyroidism -  Ca in goal.  Will get phos pre HD.  Continue binders/VDRA. 6.  Nutrition - Renal diet w/fluid restrictions.  7. DMT1 - A1C 9.0. Per primary  Jen Mow, PA-C [facility] Pager: [phone] 06/27/2018, 3:46 PM   Pt seen, examined and agree w A/P as above. Will follow.  Kelly Splinter MD [employer] pager [phone]  06/27/2018, 5:00 PM

## 2018-06-27 NOTE — ED Notes (Signed)
Pt moved to hospital bed for comfort.

## 2018-06-27 NOTE — Progress Notes (Signed)
Admission note:  Arrival Method: Patient arrived from [facility] ED. Mental Orientation:  Alert and oriented x 4. Telemetry: N/A Assessment: see doc flow sheets. Skin: Intact. IV: Right AC and R hand Pain: Denies any pain. Tubes: N/A Safety Measures: Bed in low position, call bell and phone within reach. Fall Prevention Safety Plan: Reviewed the plan, verbalized understanding. Admission Screening:  In Process.  5100 Orientation: Patient has been oriented to the unit, staff and to the room.

## 2018-06-27 NOTE — ED Notes (Addendum)
Pt called out for help stating she felt her blood sugar was low, checked blood sugar and pt made aware blood sugar was 119

## 2018-06-28 LAB — MRSA PCR SCREENING: MRSA BY PCR: NEGATIVE

## 2018-06-28 LAB — GLUCOSE, CAPILLARY
Glucose-Capillary: 373 mg/dL — ABNORMAL HIGH (ref 70–99)
Glucose-Capillary: 130 mg/dL — ABNORMAL HIGH (ref 70–99)
Glucose-Capillary: 181 mg/dL — ABNORMAL HIGH (ref 70–99)

## 2018-06-28 LAB — HIV ANTIBODY (ROUTINE TESTING W REFLEX): HIV Screen 4th Generation wRfx: NONREACTIVE

## 2018-06-28 MED ORDER — LEVOFLOXACIN 750 MG PO TABS
750.0000 mg | ORAL_TABLET | Freq: Once | ORAL | Status: AC
  Administered 2018-06-28: 750 mg via ORAL
  Filled 2018-06-28: qty 1

## 2018-06-28 MED ORDER — CHLORHEXIDINE GLUCONATE CLOTH 2 % EX PADS: 6.0000 | MEDICATED_PAD | Freq: Every day | CUTANEOUS | Status: DC

## 2018-06-28 MED ORDER — LEVOFLOXACIN 750 MG PO TABS: 750.0000 mg | ORAL_TABLET | ORAL | 0 refills | Status: DC

## 2018-06-28 NOTE — Progress Notes (Signed)
Patient was discharged to home, AVS reviewed and all questions answered. Prescriptions were sent to patients pharmacy. IV removed. Left floor via wheelchair with staff member

## 2018-06-28 NOTE — Discharge Summary (Signed)
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  CXR showed b/l ground glass opacities, R > L. Temp was 101.1. Started on treatment for pneumonia  Hospital Course:   Bilateral pneumonia- HCAP - on Vanc and Cefepime-  - has improved - MRSA negative- dc/ Vanc - transition to Levaquin  ESRD - TTS dialysis- managed by Nephro  DM1  - HbA1c 9.0 on 06/27/18 - out patient management   Discharge Exam: Vitals:   06/28/18 0448 06/28/18 0737  BP: 113/71 124/73  Pulse: 91 86  Resp: 18 18  Temp: 99.4 F (37.4 C)   SpO2: 97% 97%   Vitals:   06/27/18 2230 06/27/18 2241 06/28/18 0448 06/28/18 0737  BP: (!) 168/86 (!) 144/78 113/71 124/73  Pulse: 94 93 91 86  Resp: 18 19 18 18   Temp: 99.2 F (37.3 C) 99.4 F (37.4 C) 99.4 F (37.4 C)   TempSrc: Oral Oral Oral   SpO2: 98% 100% 97% 97%  Weight: 58 kg     Height:        General: Pt is alert, awake, not in acute distress Cardiovascular: RRR, S1/S2 +, no rubs, no gallops Respiratory: mild basilar crackles bilaterally , no wheezing, no rhonchi Abdominal: Soft, NT, ND, bowel sounds + Extremities: no edema, no cyanosis   Discharge Instructions  Discharge Instructions    Diet general   Complete by:  As directed    Renal, diabetic diet   Increase activity slowly   Complete by:  As  directed      Allergies as of 06/28/2018   No Known Allergies     Medication List    TAKE these medications   amLODipine 5 MG tablet Commonly known as:  NORVASC Take 1 tablet (5 mg total) by mouth daily. What changed:  when to take this   calcium acetate 667 MG capsule Commonly known as:  PHOSLO Take 3 capsules (2,001 mg total) by mouth 3 (three) times daily with meals.   CORICIDIN HBP COLD/FLU PO Take 2 tablets by mouth daily as needed (flu).   Famotidine 20 MG Chew Chew 20 mg by mouth as needed for heartburn or indigestion.   insulin aspart 100 UNIT/ML injection Commonly known as:  novoLOG Inject 0-9 Units into the skin 3 (three) times daily with meals. What changed:  how much to take   insulin glargine 100 UNIT/ML injection Commonly known as:  LANTUS Inject 0.22 mLs (22 Units total) into the skin at bedtime. What changed:  how much to take   levofloxacin 750 MG tablet Commonly known as:  LEVAQUIN Take 1 tablet (750 mg total) by mouth every other day. Start taking on:  June 29, 2018   metoprolol succinate 25 MG 24 hr tablet Commonly known as:  TOPROL-XL Take 1 tablet (25 mg total) by mouth daily. What changed:  when to  take this   multivitamin Tabs tablet Take 1 tablet by mouth at bedtime.   norethindrone 0.35 MG tablet Commonly known as:  MICRONOR,CAMILA,ERRIN Take 1 tablet (0.35 mg total) by mouth daily.   ondansetron 4 MG disintegrating tablet Commonly known as:  ZOFRAN ODT Take 1 tablet (4 mg total) by mouth every 8 (eight) hours as needed for nausea or vomiting.   SENSIPAR 60 MG tablet Generic drug:  cinacalcet Take 60 mg by mouth daily.       No Known Allergies   Procedures/Studies:    Dg Chest 2 View  Result Date: 06/26/2018 CLINICAL DATA:  Cough fever congestion EXAM: CHEST - 2 VIEW COMPARISON:  CT 05/13/2018 FINDINGS: No significant effusion. Mild cardiomegaly. Left greater than right diffuse ground-glass opacity. Stent in the left  upper extremity. No pneumothorax. Partial consolidation in the left lower lobe. IMPRESSION: 1. Cardiomegaly with central vascular congestion 2. Diffuse left greater than right ground-glass opacity, may reflect edema or diffuse infection. Partial consolidation in the left lower lobe. Electronically Signed   By: Donavan Foil M.D.   On: 06/26/2018 21:17   Ct Head Wo Contrast  Result Date: 06/12/2018 CLINICAL DATA:  Onset headache today.  No known injury. EXAM: CT HEAD WITHOUT CONTRAST TECHNIQUE: Contiguous axial images were obtained from the base of the skull through the vertex without intravenous contrast. COMPARISON:  None. FINDINGS: Brain: No evidence of acute infarction, hemorrhage, hydrocephalus, extra-axial collection or mass lesion/mass effect. Vascular: No hyperdense vessel or unexpected calcification. Skull: Negative. Sinuses/Orbits: Negative. Other: None. IMPRESSION: Normal head CT. Electronically Signed   By: Inge Rise M.D.   On: 06/12/2018 15:26     The results of significant diagnostics from this hospitalization (including imaging, microbiology, ancillary and laboratory) are listed below for reference.     Microbiology: Recent Results (from the past 240 hour(s))  Blood Culture (routine x 2)     Status: None (Preliminary result)   Collection Time: 06/26/18  9:47 PM  Result Value Ref Range Status   Specimen Description BLOOD BLOOD RIGHT FOREARM  Final   Special Requests   Final    BOTTLES DRAWN AEROBIC AND ANAEROBIC Blood Culture results may not be optimal due to an excessive volume of blood received in culture bottles   Culture   Final    NO GROWTH 2 DAYS Performed at [facility], [place] [address], [place], [place] [address]    Report Status PENDING  Incomplete  MRSA PCR Screening     Status: None   Collection Time: 06/28/18 11:43 AM  Result Value Ref Range Status   MRSA by PCR NEGATIVE NEGATIVE Final    Comment:        The GeneXpert MRSA Assay (FDA approved for  NASAL specimens only), is one component of a comprehensive MRSA colonization surveillance program. It is not intended to diagnose MRSA infection nor to guide or monitor treatment for MRSA infections. Performed at [facility], [place] [address], [place], [place] [address]      Labs: BNP (last 3 results) No results for input(s): BNP in the last 8760 hours. Basic Metabolic Panel: Recent Labs  Lab 06/26/18 2058 06/26/18 2154 06/27/18 0423  NA 130* 130* 135  K 5.3* 4.2 4.1  CL 93* 94* 97*  CO2 24  --  26  GLUCOSE 377* 344* 92  BUN 42* 38* 46*  CREATININE 10.21* 10.60* 11.78*  CALCIUM 8.3*  --  7.8*   Liver Function Tests: Recent Labs  Lab 06/26/18 2058  AST 28  ALT 24  ALKPHOS 117  BILITOT 0.5  PROT 6.8  ALBUMIN 3.6   No results for input(s): LIPASE, AMYLASE in the last 168 hours. No results for input(s): AMMONIA in the last 168 hours. CBC: Recent Labs  Lab 06/26/18 2058 06/26/18 2154 06/27/18 0423  WBC 8.6  --  8.2  NEUTROABS 7.6  --   --   HGB 11.1* 11.6* 9.3*  HCT 36.1 34.0* 30.1*  MCV 94.8  --  95.9  PLT 230  --  200   Cardiac Enzymes: No results for input(s): CKTOTAL, CKMB, CKMBINDEX, TROPONINI in the last 168 hours. BNP: Invalid input(s): POCBNP CBG: Recent Labs  Lab 06/27/18 1343 06/27/18 1707 06/27/18 2242 06/28/18 0734 06/28/18 1113  GLUCAP 88 176* 242* 373* 130*   D-Dimer No results for input(s): DDIMER in the last 72 hours. Hgb A1c Recent Labs    06/27/18 1500  HGBA1C 9.0*   Lipid Profile No results for input(s): CHOL, HDL, LDLCALC, TRIG, CHOLHDL, LDLDIRECT in the last 72 hours. Thyroid function studies No results for input(s): TSH, T4TOTAL, T3FREE, THYROIDAB in the last 72 hours.  Invalid input(s): FREET3 Anemia work up No results for input(s): VITAMINB12, FOLATE, FERRITIN, TIBC, IRON, RETICCTPCT in the last 72 hours. Urinalysis    Component Value Date/Time   COLORURINE YELLOW 03/21/2018 2109   APPEARANCEUR TURBID  (A) 03/21/2018 2109   LABSPEC 1.014 03/21/2018 2109   PHURINE 8.0 03/21/2018 2109   GLUCOSEU >=500 (A) 03/21/2018 2109   HGBUR MODERATE (A) 03/21/2018 2109   BILIRUBINUR NEGATIVE 03/21/2018 2109   KETONESUR NEGATIVE 03/21/2018 2109   PROTEINUR >=300 (A) 03/21/2018 2109   UROBILINOGEN 0.2 04/07/2014 1457   NITRITE NEGATIVE 03/21/2018 2109   LEUKOCYTESUR LARGE (A) 03/21/2018 2109   Sepsis Labs Invalid input(s): PROCALCITONIN,  WBC,  LACTICIDVEN Microbiology Recent Results (from the past 240 hour(s))  Blood Culture (routine x 2)     Status: None (Preliminary result)   Collection Time: 06/26/18  9:47 PM  Result Value Ref Range Status   Specimen Description BLOOD BLOOD RIGHT FOREARM  Final   Special Requests   Final    BOTTLES DRAWN AEROBIC AND ANAEROBIC Blood Culture results may not be optimal due to an excessive volume of blood received in culture bottles   Culture   Final    NO GROWTH 2 DAYS Performed at [facility], [place] [address], [place], [place] [address]    Report Status PENDING  Incomplete  MRSA PCR Screening     Status: None   Collection Time: 06/28/18 11:43 AM  Result Value Ref Range Status   MRSA by PCR NEGATIVE NEGATIVE Final    Comment:        The GeneXpert MRSA Assay (FDA approved for NASAL specimens only), is one component of a comprehensive MRSA colonization surveillance program. It is not intended to diagnose MRSA infection nor to guide or monitor treatment for MRSA infections. Performed at [facility], [place] [address], [place], [place] [address]      Time coordinating discharge in minutes: 61  SIGNED:   Debbe Odea, MD  [facility] 06/28/2018, 3:38 PM Pager   If 7PM-7AM, please contact night-coverage [email or web address] Password [email or web address]

## 2018-06-28 NOTE — Progress Notes (Signed)
[name] Progress Note   Subjective:  Seen and examined in room.  Father at bedside.  Reports feeling a little better today.  N/v with breakfast this AM.  Breathing improved, no longer using O2.  Denies CP, SOB, diarrhea, abdominal pain, fever and chills.  Tolerated HD well yesterday.   Objective Vitals:   06/27/18 2230 06/27/18 2241 06/28/18 0448 06/28/18 0737  BP: (!) 168/86 (!) 144/78 113/71 124/73  Pulse: 94 93 91 86  Resp: 18 19 18 18   Temp: 99.2 F (37.3 C) 99.4 F (37.4 C) 99.4 F (37.4 C)   TempSrc: Oral Oral Oral   SpO2: 98% 100% 97% 97%  Weight: 58 kg     Height:       Physical Exam General:NAD, well appearing, pleasant female Heart:RRR, no mrg Lungs:CTAB, BS decreased, normal WOB Abdomen:soft, NTND Extremities:no edema Dialysis Access: LU AVF +b/t   Filed Weights   06/26/18 1905 06/27/18 1746 06/27/18 2230  Weight: 55.8 kg 62.4 kg 58 kg    Intake/Output Summary (Last 24 hours) at 06/28/2018 1249 Last data filed at 06/28/2018 0600 Gross per 24 hour  Intake 220.94 ml  Output 3040 ml  Net -2819.06 ml    Additional Objective Labs: Basic Metabolic Panel: Recent Labs  Lab 06/26/18 2058 06/26/18 2154 06/27/18 0423  NA 130* 130* 135  K 5.3* 4.2 4.1  CL 93* 94* 97*  CO2 24  --  26  GLUCOSE 377* 344* 92  BUN 42* 38* 46*  CREATININE 10.21* 10.60* 11.78*  CALCIUM 8.3*  --  7.8*   Liver Function Tests: Recent Labs  Lab 06/26/18 2058  AST 28  ALT 24  ALKPHOS 117  BILITOT 0.5  PROT 6.8  ALBUMIN 3.6   CBC: Recent Labs  Lab 06/26/18 2058 06/26/18 2154 06/27/18 0423  WBC 8.6  --  8.2  NEUTROABS 7.6  --   --   HGB 11.1* 11.6* 9.3*  HCT 36.1 34.0* 30.1*  MCV 94.8  --  95.9  PLT 230  --  200   Blood Culture    Component Value Date/Time   SDES BLOOD BLOOD RIGHT FOREARM 06/26/2018 2147   SPECREQUEST  06/26/2018 2147    BOTTLES DRAWN AEROBIC AND ANAEROBIC Blood Culture results may not be optimal due to an excessive volume of  blood received in culture bottles   CULT  06/26/2018 2147    NO GROWTH 2 DAYS Performed at [facility], [address] [place], [place] [address]    REPTSTATUS PENDING 06/26/2018 2147   CBG: Recent Labs  Lab 06/27/18 1343 06/27/18 1707 06/27/18 2242 06/28/18 0734 06/28/18 1113  GLUCAP 88 176* 242* 373* 130*   Studies/Results: Dg Chest 2 View  Result Date: 06/26/2018 CLINICAL DATA:  Cough fever congestion EXAM: CHEST - 2 VIEW COMPARISON:  CT 05/13/2018 FINDINGS: No significant effusion. Mild cardiomegaly. Left greater than right diffuse ground-glass opacity. Stent in the left upper extremity. No pneumothorax. Partial consolidation in the left lower lobe. IMPRESSION: 1. Cardiomegaly with central vascular congestion 2. Diffuse left greater than right ground-glass opacity, may reflect edema or diffuse infection. Partial consolidation in the left lower lobe. Electronically Signed   By: Donavan Foil M.D.   On: 06/26/2018 21:17    Medications: . ceFEPime (MAXIPIME) IV Stopped (06/28/18 5361)   . amLODipine  10 mg Oral QHS  . calcium acetate  2,001 mg Oral TID WC  . Chlorhexidine Gluconate Cloth  6 each Topical Q0600  . cinacalcet  60 mg Oral Q breakfast  . guaiFENesin  600 mg Oral BID  . heparin  5,000 Units Subcutaneous Q8H  . insulin aspart  0-9 Units Subcutaneous TID WC  . insulin glargine  10 Units Subcutaneous QHS  . metoprolol succinate  25 mg Oral QHS  . multivitamin  1 tablet Oral QHS  . norethindrone  1 tablet Oral Daily  . vancomycin  500 mg Intravenous Q T,Th,Sa-HD    Dialysis Orders: TTS - [facility]  4hrs, BFR 400, DFR 800,  EDW 57kg, 2K/ 3.5Ca  Access: LU AVF  Heparin 1800 unit bolus Aranesp 100 mcg q2wks - last 06/24/18 Hectorol 44mcg IV qHD   Assessment/Plan: 1.  LLL PNA - CXR showed b/l infiltrates L>R, Flu neg, Tmax last 24hrs 99.98F. On ABX, BCx2 w/NGTD. Per primary  2.  ESRD -  On HD TTS.  K 4.1. HD tolerated well yesterday with net UF removed ~3L.  Orders written for HD tomorrow per regular schedule. 3.  Hypertension/volume  - BP in goal today. Have been adjusting meds as OP for better control, ^amlodipine to 10mg  qd last week. CXR showed possible pulmonary edema vs pneumonia.  Does not appear volume overloaded on exam, 1kg over EDW.  Titrate down volume as tolerated.  4.  Anemia of CKD - Hgb 9.3. Due for ESA on Sat.  Follow trends 5.  Secondary Hyperparathyroidism -  To check RFP tomorrow pre HD. Continue binders/VDRA. 6.  Nutrition - Renal diet w/fluid restrictions.  7. DMT1 - A1C 9.0. Per primary  Jen Mow, PA-C [name] Pager: [phone] 06/28/2018,12:49 PM  LOS: 2 days

## 2018-06-28 NOTE — Progress Notes (Signed)
Inpatient Diabetes Program Recommendations  AACE/ADA: New Consensus Statement on Inpatient Glycemic Control (2015)  Target Ranges:  Prepandial:   less than 140 mg/dL      Peak postprandial:   less than 180 mg/dL (1-2 hours)      Critically ill patients:  140 - 180 mg/dL   Lab Results  Component Value Date   GLUCAP 130 (H) 06/28/2018   HGBA1C 9.0 (H) 06/27/2018     Results for [name], [name] (MRN [id number]) as of 06/28/2018 13:06  Ref. Range 06/27/2018 08:28 06/27/2018 12:20 06/27/2018 13:09 06/27/2018 13:43 06/27/2018 17:07 06/27/2018 22:42 06/28/2018 07:34 06/28/2018 11:13  Glucose-Capillary Latest Ref Range: 70 - 99 mg/dL 172 (H)  Novolog 2 units  63 (L) 99 88 176 (H) 242 (H) 373 (H)  Novolog 9 units  130 (H)  Novolog 1 unit    Type 1 diabetic  Home DM meds: Lantus 15 units q hs                              Novolog 3 units tid wc  Current inpatient DM meds: Lantus 10 units q hs                                               Novolog sensitive (0-9 units) tid ac    CBG this am was high at 373 mg/dl. Spoke to beside Therapist, sports and she stated patient is eating. Recommend to increase basal to home dose and add Novolog meal coverage tid.   MD please consider following in hospital insulin adjustment:  Increase Lantus to full home dose of 15 units qhs  Novolog 2 units meal coverage tid (Hold if NPO or if patient eats less than 50% of meal.)   Thank you.  -- Will follow during hospitalization.--  Jonna Clark RN, MSN Diabetes Coordinator Inpatient Glycemic Control Team Team Pager: [phone] (8am-5pm)

## 2018-07-01 LAB — CULTURE, BLOOD (ROUTINE X 2): Culture: NO GROWTH

## 2018-07-05 ENCOUNTER — Ambulatory Visit: Payer: Self-pay | Admitting: Nurse Practitioner

## 2018-07-07 ENCOUNTER — Inpatient Hospital Stay: Payer: Self-pay | Admitting: Nurse Practitioner

## 2018-07-13 ENCOUNTER — Encounter: Payer: Self-pay | Admitting: Internal Medicine

## 2018-07-13 ENCOUNTER — Ambulatory Visit (INDEPENDENT_AMBULATORY_CARE_PROVIDER_SITE_OTHER): Payer: Medicaid Other | Admitting: Internal Medicine

## 2018-07-13 ENCOUNTER — Other Ambulatory Visit: Payer: Self-pay

## 2018-07-13 VITALS — BP 160/88 | HR 87 | Temp 98.8°F | Resp 18 | Ht 61.0 in | Wt 128.8 lb

## 2018-07-13 DIAGNOSIS — I10 Essential (primary) hypertension: Secondary | ICD-10-CM

## 2018-07-13 DIAGNOSIS — Z8673 Personal history of transient ischemic attack (TIA), and cerebral infarction without residual deficits: Secondary | ICD-10-CM

## 2018-07-13 DIAGNOSIS — N186 End stage renal disease: Secondary | ICD-10-CM | POA: Diagnosis not present

## 2018-07-13 DIAGNOSIS — Z992 Dependence on renal dialysis: Secondary | ICD-10-CM

## 2018-07-13 DIAGNOSIS — R011 Cardiac murmur, unspecified: Secondary | ICD-10-CM

## 2018-07-13 DIAGNOSIS — J189 Pneumonia, unspecified organism: Secondary | ICD-10-CM | POA: Diagnosis not present

## 2018-07-13 DIAGNOSIS — E1022 Type 1 diabetes mellitus with diabetic chronic kidney disease: Secondary | ICD-10-CM

## 2018-07-13 DIAGNOSIS — Z09 Encounter for follow-up examination after completed treatment for conditions other than malignant neoplasm: Secondary | ICD-10-CM

## 2018-07-13 MED ORDER — AMLODIPINE BESYLATE 5 MG PO TABS: ORAL_TABLET | ORAL | 0 refills | Status: DC

## 2018-07-13 NOTE — Progress Notes (Addendum)
Subjective:     Patient ID: [name] , female    DOB: [birth year] , 29 y.o.   MRN: [id number]   Chief Complaint  Patient presents with  . Hospitalization Follow-up    was seen in the ER on 1/6 for pneumonia need to follow-up. Pt states she feels better.   Needs to establish care  HPI Pt is here to establish care with out practice. [name] is a 28 y/o who had CVA  In 2015  Secondary to poorly controlled DM, glucose at that time was 2,000, and the following year developed hypertension which was poorly controlled and 2018 was told she had renal insufficiency and was warned to start preparing for dialysis and by 06/2017 was diagnosed with renal failure secondary to HTN. Started dialysis 01/26/2018. She never saw a cardiologist to control her BP.  Pt presents today for FU pneumonia diagnosed on 06/26/18. CXR showed b/l ground glass opacities, R > L. She finished her Levaquin 9 days ago. She feels 90% better, but her BP is still elevated and she has been taking her Norvasc and Toprol qhs. At home her BP has been 142-185/  Cant recall the diastolic #. And takes it around  2 h after taking her medication. Last dialysis was today. Sleeps fine and denies being stressed.  Her endocrinologist told pt she heard a murmur 10 years ago, and since then no one else herd it. Then EMT heard it this month when she was picked up to be taken to the hospital.  She was diagnosed with DM type 1 at age 43 and follows with Endocrinology for this, and is on a sliding scale. Her glucose have been up to 300's at times.   Past Medical History:  Diagnosis Date  . Cardiac arrest ([place])   . Complication of anesthesia   . Diabetes mellitus type 1 ([place])    [facility]  . DKA (diabetic ketoacidoses) ([place])   . ESRD (end stage renal disease) ([facility])    TTS HD, dialyzed at [facility]  . Hypertension   . PONV (postoperative nausea and vomiting)   . Stroke [facility])      Family History  Problem Relation Age of  Onset  . Diabetes Maternal Grandmother   . Stroke Maternal Grandmother   . Cancer Maternal Aunt        aunt, passed last month, ?RCC  . Cancer Maternal Uncle      Current Outpatient Medications:  .  amLODipine (NORVASC) 5 MG tablet, One bid, Disp: 60 tablet, Rfl: 0 .  B-D ULTRAFINE III SHORT PEN 31G X 8 MM MISC, USE WITH INSULIN FOUR TIMES DAILY, Disp: , Rfl:  .  calcium acetate (PHOSLO) 667 MG capsule, Take 3 capsules (2,001 mg total) by mouth 3 (three) times daily with meals., Disp: 180 capsule, Rfl: 2 .  Chlorpheniramine-Acetaminophen (CORICIDIN HBP COLD/FLU PO), Take 2 tablets by mouth daily as needed (flu)., Disp: , Rfl:  .  Famotidine 20 MG CHEW, Chew 20 mg by mouth as needed for heartburn or indigestion., Disp: , Rfl:  .  insulin aspart (NOVOLOG) 100 UNIT/ML injection, Inject 0-9 Units into the skin 3 (three) times daily with meals. (Patient taking differently: Inject 3 Units into the skin 3 (three) times daily with meals. ), Disp: 10 mL, Rfl: 11 .  insulin glargine (LANTUS) 100 UNIT/ML injection, Inject 0.22 mLs (22 Units total) into the skin at bedtime. (Patient taking differently: Inject 15 Units into the skin at bedtime. ),  Disp: 10 mL, Rfl: 0 .  metoprolol succinate (TOPROL-XL) 25 MG 24 hr tablet, Take 1 tablet (25 mg total) by mouth daily. (Patient taking differently: Take 25 mg by mouth at bedtime. ), Disp: 30 tablet, Rfl: 0 .  multivitamin (RENA-VIT) TABS tablet, Take 1 tablet by mouth at bedtime., Disp: 30 tablet, Rfl: 0 .  norethindrone (MICRONOR,CAMILA,ERRIN) 0.35 MG tablet, Take 1 tablet (0.35 mg total) by mouth daily., Disp: 1 Package, Rfl: 11 .  ondansetron (ZOFRAN ODT) 4 MG disintegrating tablet, Take 1 tablet (4 mg total) by mouth every 8 (eight) hours as needed for nausea or vomiting., Disp: 20 tablet, Rfl: 0 .  SENSIPAR 60 MG tablet, Take 60 mg by mouth daily., Disp: , Rfl: 5   No Known Allergies   Review of Systems  HENT: Negative.   Respiratory: Negative for  cough, shortness of breath and wheezing.   Cardiovascular: Positive for palpitations. Negative for chest pain and leg swelling.       When she lays down, asymptomatic.   Gastrointestinal: Negative for nausea and vomiting.  Neurological: Positive for headaches.     Today's Vitals   07/13/18 1518 07/13/18 1617  BP: (!) 180/88 (!) 160/88  Pulse: 87   Resp: 18   Temp: 98.8 F (37.1 C)   TempSrc: Oral   SpO2: 95%   Weight: 128 lb 12.8 oz (58.4 kg)   Height: 5\' 1"  (1.549 m)    Body mass index is 24.34 kg/m.   Objective:  Physical Exam  Constitutional: She is oriented to person, place, and time. She appears well-developed and well-nourished. No distress.  HENT:  Head: Normocephalic and atraumatic.  Right Ear: External ear normal.  Left Ear: External ear normal.  Nose: Nose normal.  Eyes: Conjunctivae are normal. I can see the lense implants in both corneas. Right eye exhibits no discharge. Left eye exhibits no discharge. No scleral icterus.  Neck: Neck supple. No thyromegaly present.  Cardiovascular: Normal rate and regular rhythm.  1-2/ 6 murmur heard, louder at the R upper sternal border and when sitting up compared to laying down. Pulmonary/Chest: Effort normal and breath sounds normal. No respiratory distress.  Musculoskeletal: Normal range of motion. She exhibits no edema.  Lymphadenopathy:    She has no cervical adenopathy.  Neurological: She is alert and oriented to person, place, and time.  Skin: Skin is warm and dry. Capillary refill takes less than 2 seconds. No rash noted. She is not diaphoretic.  Psychiatric: She has a normal mood and affect. Her behavior is normal. Judgment and thought content normal.  Nursing note reviewed.   Assessment And Plan:    1. Pneumonia of both lungs due to infectious organism, unspecified part of lung- improved. We will inform her when the result is back.  - DG Chest 2 View; Future  2. Type 1 diabetes mellitus with chronic kidney disease  on chronic dialysis ([place])- chronic. Will continue Fu with endocrinologist.   3. Heart murmur- chronic.  - Ambulatory referral to Cardiology  4. Uncontrolled hypertension- I added another 5 mg Norvasc to take in am and told to switch her Toprol to the am.  - Ambulatory referral to Cardiology - amLODipine (NORVASC) 5 MG tablet; One bid  Dispense: 60 tablet; Refill: 0  I asked her to do BP diaries and bring them with her next week.  No labs were repeated today.   Lakiyah Arntson RODRIGUEZ-SOUTHWORTH, PA-C

## 2018-07-13 NOTE — Patient Instructions (Addendum)
  Take a second Amlodipine 5 mg in the morning with the Toprol, and a second Amlodipine at night time like you have been.    If you have lab work done today you will be contacted with your lab results within the next 2 weeks.  If you have not heard from [place] then please contact us. The fastest way to get your results is to register for My Chart.   IF you received an x-ray today, you will receive an invoice from [facility]. Please contact [facility] at [phone] with questions or concerns regarding your invoice.   IF you received labwork today, you will receive an invoice from [facility]. Please contact [facility] at [phone] with questions or concerns regarding your invoice.   Our billing staff will not be able to assist you with questions regarding bills from these companies.  You will be contacted with the lab results as soon as they are available. The fastest way to get your results is to activate your My Chart account. Instructions are located on the last page of this paperwork. If you have not heard from [place] regarding the results in 2 weeks, please contact this office.

## 2018-07-18 ENCOUNTER — Emergency Department (HOSPITAL_COMMUNITY)
Admission: EM | Admit: 2018-07-18 | Discharge: 2018-07-19 | Disposition: A | Discharge status: Home / Self Care | Payer: Medicaid Other | Attending: Emergency Medicine | Admitting: Emergency Medicine

## 2018-07-18 DIAGNOSIS — Z992 Dependence on renal dialysis: Secondary | ICD-10-CM | POA: Insufficient documentation

## 2018-07-18 DIAGNOSIS — Z79899 Other long term (current) drug therapy: Secondary | ICD-10-CM | POA: Insufficient documentation

## 2018-07-18 DIAGNOSIS — E1022 Type 1 diabetes mellitus with diabetic chronic kidney disease: Secondary | ICD-10-CM | POA: Insufficient documentation

## 2018-07-18 DIAGNOSIS — R112 Nausea with vomiting, unspecified: Secondary | ICD-10-CM | POA: Diagnosis present

## 2018-07-18 DIAGNOSIS — Z8673 Personal history of transient ischemic attack (TIA), and cerebral infarction without residual deficits: Secondary | ICD-10-CM | POA: Diagnosis not present

## 2018-07-18 DIAGNOSIS — I120 Hypertensive chronic kidney disease with stage 5 chronic kidney disease or end stage renal disease: Secondary | ICD-10-CM | POA: Insufficient documentation

## 2018-07-18 DIAGNOSIS — I10 Essential (primary) hypertension: Secondary | ICD-10-CM

## 2018-07-18 DIAGNOSIS — Z794 Long term (current) use of insulin: Secondary | ICD-10-CM | POA: Diagnosis not present

## 2018-07-18 DIAGNOSIS — N186 End stage renal disease: Secondary | ICD-10-CM | POA: Insufficient documentation

## 2018-07-18 LAB — LIPASE, BLOOD: Lipase: 27 U/L (ref 11–51)

## 2018-07-18 LAB — COMPREHENSIVE METABOLIC PANEL
ALT: 23 U/L (ref 0–44)
AST: 25 U/L (ref 15–41)
Albumin: 4.1 g/dL (ref 3.5–5.0)
Alkaline Phosphatase: 89 U/L (ref 38–126)
Anion gap: 12 (ref 5–15)
BUN: 18 mg/dL (ref 6–20)
CO2: 27 mmol/L (ref 22–32)
Calcium: 10.8 mg/dL — ABNORMAL HIGH (ref 8.9–10.3)
Chloride: 98 mmol/L (ref 98–111)
Creatinine, Ser: 6.65 mg/dL — ABNORMAL HIGH (ref 0.44–1.00)
GFR calc Af Amer: 9 mL/min — ABNORMAL LOW (ref >60)
GFR calc non Af Amer: 8 mL/min — ABNORMAL LOW (ref >60)
Glucose, Bld: 105 mg/dL — ABNORMAL HIGH (ref 70–99)
Potassium: 4.5 mmol/L (ref 3.5–5.1)
Sodium: 137 mmol/L (ref 135–145)
Total Bilirubin: 0.7 mg/dL (ref 0.3–1.2)
Total Protein: 7.7 g/dL (ref 6.5–8.1)

## 2018-07-18 LAB — CBC
HCT: 46.3 % — ABNORMAL HIGH (ref 36.0–46.0)
Hemoglobin: 14 g/dL (ref 12.0–15.0)
MCH: 28.1 pg (ref 26.0–34.0)
MCHC: 30.2 g/dL (ref 30.0–36.0)
MCV: 92.8 fL (ref 80.0–100.0)
NRBC: 0 % (ref 0.0–0.2)
Platelets: 206 10*3/uL (ref 150–400)
RBC: 4.99 MIL/uL (ref 3.87–5.11)
RDW: 13.3 % (ref 11.5–15.5)
WBC: 5.8 10*3/uL (ref 4.0–10.5)

## 2018-07-18 LAB — I-STAT BETA HCG BLOOD, ED (MC, WL, AP ONLY): I-stat hCG, quantitative: <5 m[IU]/mL (ref <5)

## 2018-07-18 NOTE — ED Triage Notes (Signed)
Pt presents to ED via EMS for evaluation of N/V since 9 am today. Denies abdominal pain and diarrhea. Dialysis pt, had three hours of tx today instead of four d/t not feeling well.

## 2018-07-19 LAB — CBG MONITORING, ED: Glucose-Capillary: 216 mg/dL — ABNORMAL HIGH (ref 70–99)

## 2018-07-19 MED ORDER — ONDANSETRON 4 MG PO TBDP
4.0000 mg | ORAL_TABLET | Freq: Once | ORAL | Status: AC
  Administered 2018-07-19: 4 mg via ORAL
  Filled 2018-07-19: qty 1

## 2018-07-19 MED ORDER — ONDANSETRON 4 MG PO TBDP: 4.0000 mg | ORAL_TABLET | Freq: Three times a day (TID) | ORAL | 0 refills | Status: AC | PRN

## 2018-07-19 MED ORDER — INSULIN GLARGINE 100 UNIT/ML ~~LOC~~ SOLN
15.0000 [IU] | Freq: Every day | SUBCUTANEOUS | Status: DC
  Administered 2018-07-19: 15 [IU] via SUBCUTANEOUS
  Filled 2018-07-19: qty 0.15

## 2018-07-19 NOTE — ED Notes (Signed)
Pt. States they are aneuric.

## 2018-07-19 NOTE — ED Notes (Signed)
Pt verbalized understanding of dc instructions, vss, ambulatory upon discharge.

## 2018-07-19 NOTE — Discharge Instructions (Signed)
Use Zofran as needed for management of nausea and vomiting.  Continue dialysis as scheduled.  Have your blood pressure rechecked by your primary care doctor within the week.  You may return to the ED for new or concerning symptoms.

## 2018-07-19 NOTE — ED Notes (Signed)
Pt is able to tolerate PO fluids without nausea or emesis

## 2018-07-20 NOTE — ED Provider Notes (Signed)
[facility] EMERGENCY DEPARTMENT Provider Note   CSN: [id number] Arrival date & time: 07/18/18  1639    History   Chief Complaint Chief Complaint  Patient presents with  . Nausea  . Emesis    HPI [name] is a 28 y.o. female.  28 year old female with a history of diabetes mellitus, ESRD on TTS dialysis, stroke, prior cardiac arrest presents to the emergency department for complaints of nausea and vomiting.  States that she usually develops nausea with emesis when her blood sugar is low.  Was only able to tolerate 3 hours of dialysis as a result of her nausea today.  Was experiencing recurrent nausea and vomiting at home, unrelieved with home antiemetics prompting her visit to the ED.  States that her nausea has improved since triage and she has been able to eat chips while in the waiting room.  Denies any nausea currently.  Further denies fever, abdominal pain, diarrhea.  She still makes a very small amount of urine despite being dialyzed; denies any urinary symptoms such as dysuria or hematuria.  Has been compliant with her other daily medicines.  The history is provided by the patient. No language interpreter was used.    Past Medical History:  Diagnosis Date  . Cardiac arrest ([place])   . Complication of anesthesia   . Diabetes mellitus type 1 ([place])    [facility]  . DKA (diabetic ketoacidoses) ([place])   . ESRD (end stage renal disease) ([facility])    TTS HD, dialyzed at [facility]  . Hypertension   . PONV (postoperative nausea and vomiting)   . Stroke [facility])     Patient Active Problem List   Diagnosis Date Noted  . Lobar pneumonia ([place]) 06/27/2018  . Dizziness 03/22/2018  . Multifocal pneumonia 05/14/2017  . Normocytic normochromic anemia 01/26/2017  . ESRD (end stage renal disease) ([place]) 01/26/2017  . Fluid overload 01/26/2017  . Depression 07/04/2014  . Hemodialysis-associated hypotension 04/24/2014  . Cerebral infarction due to  thrombosis of right middle cerebral artery ([place]) 04/24/2014  . History of anemia 04/17/2014  . Cardiac arrest ([place]) 04/14/2014  . Diabetic neuropathy ([place]) 02/14/2014  . DKA, type 1 ([place]) 02/12/2014  . Tachycardia 02/12/2014  . DOE (dyspnea on exertion) 02/12/2014  . Abdominal pain 02/12/2014  . IDDM (insulin dependent diabetes mellitus) ([place]) 02/12/2014  . Type I diabetes mellitus with renal manifestations, uncontrolled ([place]) 12/08/2013  . Hypokalemia 12/07/2013  . Noncompliance 12/06/2013  . DKA (diabetic ketoacidoses) ([place]) 12/05/2013  . Leukocytosis 12/05/2013  . Acute on chronic renal failure ([place]) 12/05/2013  . Hypercalcemia 12/05/2013  . Abnormal LFTs 12/05/2013  . Elevated lipase 12/05/2013    Past Surgical History:  Procedure Laterality Date  . CATARACT EXTRACTION     6 years ago  . DIALYSIS FISTULA CREATION    . IR FLUORO GUIDE CV LINE RIGHT  01/26/2017  . IR REMOVAL TUN CV CATH W/O FL  05/16/2017  . IR US GUIDE VASC ACCESS RIGHT  01/26/2017     OB History   No obstetric history on file.      Home Medications    Prior to Admission medications   Medication Sig Start Date End Date Taking? Authorizing Provider  amLODipine (NORVASC) 5 MG tablet One bid Patient taking differently: Take 5 mg by mouth 2 (two) times daily.  07/13/18  Yes Rodriguez-Southworth, Sunday Spillers, PA-C  calcium acetate (PHOSLO) 667 MG capsule Take 3 capsules (2,001 mg total) by mouth 3 (three)  times daily with meals. 05/16/17  Yes Rai, Ripudeep K, MD  Famotidine 20 MG CHEW Chew 20 mg by mouth as needed for heartburn or indigestion.   Yes [provider]  insulin aspart (NOVOLOG) 100 UNIT/ML injection Inject 0-9 Units into the skin 3 (three) times daily with meals. Patient taking differently: Inject 3 Units into the skin 3 (three) times daily with meals.  01/29/17  Yes Georgette Shell, MD  insulin glargine (LANTUS) 100 UNIT/ML injection Inject 0.22 mLs (22 Units total) into the skin at  bedtime. Patient taking differently: Inject 15 Units into the skin at bedtime.  03/22/18  Yes Ghimire, Henreitta Leber, MD  metoprolol succinate (TOPROL-XL) 25 MG 24 hr tablet Take 1 tablet (25 mg total) by mouth daily. 01/29/17  Yes Georgette Shell, MD  multivitamin (RENA-VIT) TABS tablet Take 1 tablet by mouth at bedtime. 01/29/17  Yes Georgette Shell, MD  norethindrone (MICRONOR,CAMILA,ERRIN) 0.35 MG tablet Take 1 tablet (0.35 mg total) by mouth daily. 01/29/17  Yes Georgette Shell, MD  SENSIPAR 60 MG tablet Take 60 mg by mouth daily. 03/16/18  Yes [provider]  B-D ULTRAFINE III SHORT PEN 31G X 8 MM MISC USE WITH INSULIN FOUR TIMES DAILY 05/27/18   [provider]  ondansetron (ZOFRAN ODT) 4 MG disintegrating tablet Take 1 tablet (4 mg total) by mouth every 8 (eight) hours as needed for nausea or vomiting. 07/19/18   Antonietta Breach, PA-C    Family History Family History  Problem Relation Age of Onset  . Diabetes Maternal Grandmother   . Stroke Maternal Grandmother   . Cancer Maternal Aunt        aunt, passed last month, ?RCC  . Cancer Maternal Uncle     Social History Social History   Tobacco Use  . Smoking status: Never Smoker  . Smokeless tobacco: Never Used  Substance Use Topics  . Alcohol use: No    Alcohol/week: 0.0 standard drinks  . Drug use: No     Allergies   Patient has no known allergies.   Review of Systems Review of Systems Ten systems reviewed and are negative for acute change, except as noted in the HPI.    Physical Exam Updated Vital Signs BP (!) 176/92 (BP Location: Right Arm)   Pulse 91   Temp 98.6 F (37 C) (Oral)   Resp 14   LMP 06/26/2018   SpO2 99%   Physical Exam Vitals signs and nursing note reviewed.  Constitutional:      General: She is not in acute distress.    Appearance: She is well-developed. She is not diaphoretic.     Comments: Nontoxic appearing and in NAD  HENT:     Head: Normocephalic and  atraumatic.  Eyes:     General: No scleral icterus.    Conjunctiva/sclera: Conjunctivae normal.  Neck:     Musculoskeletal: Normal range of motion.  Cardiovascular:     Rate and Rhythm: Normal rate and regular rhythm.     Pulses: Normal pulses.  Pulmonary:     Effort: Pulmonary effort is normal. No respiratory distress.     Breath sounds: No stridor. No wheezing or rales.     Comments: Respirations even and unlabored. Lungs CTAB. Abdominal:     General: Abdomen is flat. There is no distension.     Palpations: There is no mass.     Tenderness: There is no abdominal tenderness. There is no guarding.     Comments: Soft,  nontender abdomen.  Musculoskeletal: Normal range of motion.  Skin:    General: Skin is warm and dry.     Coloration: Skin is not pale.     Findings: No erythema or rash.  Neurological:     Mental Status: She is alert and oriented to person, place, and time.  Psychiatric:        Behavior: Behavior normal.      ED Treatments / Results  Labs (all labs ordered are listed, but only abnormal results are displayed) Labs Reviewed  COMPREHENSIVE METABOLIC PANEL - Abnormal; Notable for the following components:      Result Value   Glucose, Bld 105 (*)    Creatinine, Ser 6.65 (*)    Calcium 10.8 (*)    GFR calc non Af Amer 8 (*)    GFR calc Af Amer 9 (*)    All other components within normal limits  CBC - Abnormal; Notable for the following components:   HCT 46.3 (*)    All other components within normal limits  CBG MONITORING, ED - Abnormal; Notable for the following components:   Glucose-Capillary 216 (*)    All other components within normal limits  LIPASE, BLOOD  I-STAT BETA HCG BLOOD, ED ([facility], [facility], [facility] ONLY)    EKG None  Radiology No results found.  Procedures Procedures (including critical care time)  Medications Ordered in ED Medications  ondansetron (ZOFRAN-ODT) disintegrating tablet 4 mg (4 mg Oral Given 07/19/18 0208)     Initial Impression  / Assessment and Plan / ED Course  I have reviewed the triage vital signs and the nursing notes.  Pertinent labs & imaging results that were available during my care of the patient were reviewed by me and considered in my medical decision making (see chart for details).     28 year old female presents to the emergency department for complaints of nausea and vomiting.  This has largely resolved while in triage.  States that she usually has similar symptoms develop when her blood sugar drops.  Had a CBG of 105 on arrival.  Has been able to tolerate chips and oral fluids while in the ED.  Repeat CBG 216.  Remainder labs are consistent with baseline.  No leukocytosis.  She has no complaints of associated abdominal pain with a soft, nontender abdomen.  Reports that she has continued passing flatus and having normal bowel movements.   Plan for outpatient supportive management with Zofran.  The patient has been advised to follow-up with her primary care doctor.  Return precautions discussed and provided. Patient discharged in stable condition with no unaddressed concerns.   Final Clinical Impressions(s) / ED Diagnoses   Final diagnoses:  Non-intractable vomiting with nausea, unspecified vomiting type  Hypertension not at goal    ED Discharge Orders         Ordered    ondansetron (ZOFRAN ODT) 4 MG disintegrating tablet  Every 8 hours PRN     07/19/18 0324           Antonietta Breach, PA-C 08/01/18 0242    Fatima Blank, MD 08/01/18 [phone]

## 2018-07-24 ENCOUNTER — Other Ambulatory Visit: Payer: Self-pay

## 2018-07-24 ENCOUNTER — Ambulatory Visit: Payer: Medicaid Other | Admitting: Internal Medicine

## 2018-07-24 ENCOUNTER — Encounter: Payer: Self-pay | Admitting: Internal Medicine

## 2018-07-24 VITALS — BP 140/82 | HR 90 | Temp 98.2°F | Ht 63.8 in | Wt 135.4 lb

## 2018-07-24 DIAGNOSIS — I10 Essential (primary) hypertension: Secondary | ICD-10-CM

## 2018-07-24 MED ORDER — METOPROLOL SUCCINATE ER 50 MG PO TB24: 50.0000 mg | ORAL_TABLET | Freq: Every day | ORAL | 0 refills | Status: DC

## 2018-07-24 NOTE — Progress Notes (Signed)
Subjective:     Patient ID: [name] , female    DOB: 23-Mar-1991 , 28 y.o.   MRN: [id number]   Chief Complaint  Patient presents with  . Hypertension    HPI  Pt is here for FU HTN and increase of her Norvasc from 5 mg to 10 mg. She states she checked her BP last night 2.5h after taking her second Norvasc and her BP was in the 109'N systolically. She denies feeling flushed, having a HA or chest pains then.  She brought her cuff today and we got 190/ She has appointment with the cardiologist on 2/23.   Past Medical History:  Diagnosis Date  . Cardiac arrest ([place])   . Complication of anesthesia   . Diabetes mellitus type 1 ([place])    [facility] dx [facility]  . DKA (diabetic ketoacidoses) ([place])   . ESRD (end stage renal disease) ([facility])    TTS HD, dialyzed at [facility]  . Hypertension   . PONV (postoperative nausea and vomiting)   . Stroke [facility])      Family History  Problem Relation Age of Onset  . Diabetes Maternal Grandmother   . Stroke Maternal Grandmother   . Cancer Maternal Aunt        aunt, passed last month, ?RCC  . Cancer Maternal Uncle      Current Outpatient Medications:  .  amLODipine (NORVASC) 5 MG tablet, One bid (Patient taking differently: Take 5 mg by mouth 2 (two) times daily. ), Disp: 60 tablet, Rfl: 0 .  B-D ULTRAFINE III SHORT PEN 31G X 8 MM MISC, USE WITH INSULIN FOUR TIMES DAILY, Disp: , Rfl:  .  calcium acetate (PHOSLO) 667 MG capsule, Take 3 capsules (2,001 mg total) by mouth 3 (three) times daily with meals., Disp: 180 capsule, Rfl: 2 .  Famotidine 20 MG CHEW, Chew 20 mg by mouth as needed for heartburn or indigestion., Disp: , Rfl:  .  insulin aspart (NOVOLOG) 100 UNIT/ML injection, Inject 0-9 Units into the skin 3 (three) times daily with meals. (Patient taking differently: Inject 3 Units into the skin 3 (three) times daily with meals. ), Disp: 10 mL, Rfl: 11 .  insulin glargine (LANTUS) 100 UNIT/ML injection, Inject 0.22 mLs (22 Units  total) into the skin at bedtime. (Patient taking differently: Inject 15 Units into the skin at bedtime. ), Disp: 10 mL, Rfl: 0 .  metoprolol succinate (TOPROL-XL) 25 MG 24 hr tablet, Take 1 tablet (25 mg total) by mouth daily., Disp: 30 tablet, Rfl: 0 .  multivitamin (RENA-VIT) TABS tablet, Take 1 tablet by mouth at bedtime., Disp: 30 tablet, Rfl: 0 .  norethindrone (MICRONOR,CAMILA,ERRIN) 0.35 MG tablet, Take 1 tablet (0.35 mg total) by mouth daily., Disp: 1 Package, Rfl: 11 .  ondansetron (ZOFRAN ODT) 4 MG disintegrating tablet, Take 1 tablet (4 mg total) by mouth every 8 (eight) hours as needed for nausea or vomiting., Disp: 10 tablet, Rfl: 0 .  SENSIPAR 60 MG tablet, Take 60 mg by mouth daily., Disp: , Rfl: 5   No Known Allergies   Review of Systems  Constitutional: Negative for diaphoresis.  HENT: Negative for congestion.   Respiratory: Negative for cough, chest tightness and shortness of breath.   Cardiovascular: Negative for chest pain, palpitations and leg swelling.  Genitourinary: Negative for difficulty urinating.  Musculoskeletal: Negative for gait problem.  Skin: Negative for rash and wound.  Neurological: Negative for dizziness, numbness and headaches.  Hematological: Negative for adenopathy.  Today's Vitals   07/24/18 0913  BP: 140/82  Pulse: 90  Temp: 98.2 F (36.8 C)  TempSrc: Oral  SpO2: 98%  Weight: 135 lb 6.4 oz (61.4 kg)  Height: 5' 3.8" (1.621 m)   Body mass index is 23.39 kg/m.   Objective:  Physical Exam   Constitutional: She is oriented to person, place, and time. She appears well-developed and well-nourished. No distress.  HENT:  Head: Normocephalic and atraumatic.  Right Ear: External ear normal.  Left Ear: External ear normal.  Nose: Nose normal.  Eyes: Conjunctivae are normal. Right eye exhibits no discharge. Left eye exhibits no discharge. No scleral icterus.  Neck: Neck supple. No thyromegaly present.   Cardiovascular: Normal rate and  regular rhythm. 2/6 murmur heard.Today her murmur sounds louder than last time.  Pulmonary/Chest: Effort normal and breath sounds normal. No respiratory distress.  Musculoskeletal: Normal range of motion. She exhibits no edema. Feet shows no neuropathy, no wounds or calluses.  Lymphadenopathy: She has no cervical adenopathy.  Neurological: She is alert and oriented to person, place, and time.  Skin: Skin is warm and dry. Capillary refill takes less than 2 seconds. No rash noted. She is not diaphoretic.  Psychiatric: She has a normal mood and affect. Her behavior is normal. Judgment and thought content normal.  Nursing note reviewed.  Assessment And Plan:  1. Essential hypertension- not optimal control yet. I increased her Toprol to 50 mg today. Will stay on current medication. Fu 3 months for HTN, sooner if she has any problems. Was advised to bring her BP cuff next time, since the batteries were low today.       Olumide Dolinger RODRIGUEZ-SOUTHWORTH, PA-C

## 2018-07-24 NOTE — Patient Instructions (Signed)
BRING YOUR BLOOD PRESSURE CUFF NEXT TIME, AND ALSO TAKE IT TO THE CARDIOLGIST

## 2018-08-15 ENCOUNTER — Ambulatory Visit: Payer: Medicaid Other | Admitting: Cardiovascular Disease

## 2018-08-15 ENCOUNTER — Encounter: Payer: Self-pay | Admitting: Cardiovascular Disease

## 2018-08-15 DIAGNOSIS — I10 Essential (primary) hypertension: Secondary | ICD-10-CM

## 2018-08-15 DIAGNOSIS — R0609 Other forms of dyspnea: Secondary | ICD-10-CM | POA: Diagnosis not present

## 2018-08-15 NOTE — Patient Instructions (Signed)
Medication Instructions:  Your physician recommends that you continue on your current medications as directed. Please refer to the Current Medication list given to you today.  If you need a refill on your cardiac medications before your next appointment, please call your pharmacy.   Lab work: NONE If you have labs (blood work) drawn today and your tests are completely normal, you will receive your results only by: Marland Kitchen MyChart Message (if you have MyChart) OR . A paper copy in the mail If you have any lab test that is abnormal or we need to change your treatment, we will call you to review the results.  Testing/Procedures: NONE  Follow-Up: At [facility], you and your health needs are our priority.  As part of our continuing mission to provide you with exceptional heart care, we have created designated Provider Care Teams.  These Care Teams include your primary Cardiologist (physician) and Advanced Practice Providers (APPs -  Physician Assistants and Nurse Practitioners) who all work together to provide you with the care you need, when you need it. . You will need a follow up appointment AS NEEDED. You may see Dr. Gwenlyn Found or one of the following Advanced Practice Providers on your designated Care Team:   . Kerin Ransom, [place] . Almyra Deforest, PA-C . Fabian Sharp, PA-C . Jory Sims, DNP . Rosaria Ferries, PA-C . Roby Lofts, PA-C . Sande Rives, PA-C

## 2018-08-15 NOTE — Assessment & Plan Note (Signed)
History of essential hypertension blood pressure measured today at 149/87.  She is on amlodipine which has just been increased in dose by her PCP and Toprol.  Her blood pressure is elevated more on her off dialysis days.  She does check her blood pressure at home which usually runs 140/80 however it is elevated on her off dialysis days.  She knows to avoid salt.  Her blood pressure control is somewhat driven by how much is drawn off during dialysis.

## 2018-08-15 NOTE — Progress Notes (Signed)
08/15/2018 [name]   18-Sep-1990  [id number]  Primary Physician Rodriguez-Southworth, Sunday Spillers, PA-C Primary Cardiologist: Lorretta Harp MD Lupe Carney, [place]  HPI:  [name] is a 28 y.o. appearing single African-American female with no children who currently does not work.  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  She was referred by Marga Hoots- Southworth PA-C for cardiovascular dilation because of shortness of breath and hypertension.  Her risk factors include type 1 diabetes which she has had since age 53 on insulin as well as hypertension.  She has had a stroke 04/06/2014.  She is been on dialysis since 01/26/2016 with an upper left forearm fistula.  She gets more hypertensive on her off dialysis day as well as more dyspneic which is controlled with the amount of fluid withdrawn.  She denies chest pain.  She did have 2D echo performed 04/08/2014 which was essentially normal.   Current Meds  Medication Sig  . amLODipine (NORVASC) 5 MG tablet One bid (Patient taking differently: Take 5 mg by mouth 2 (two) times daily. )  . B-D ULTRAFINE III SHORT PEN 31G X 8 MM MISC USE WITH INSULIN FOUR TIMES DAILY  . calcium acetate (PHOSLO) 667 MG capsule Take 3 capsules (2,001 mg total) by mouth 3 (three) times daily with meals.  . Famotidine 20 MG CHEW Chew 20 mg by mouth as needed for heartburn or indigestion.  . insulin aspart (NOVOLOG) 100 UNIT/ML injection Inject 0-9 Units into the skin 3 (three) times daily with meals. (Patient taking differently: Inject 3 Units into the skin 3 (three) times daily with meals. )  . insulin glargine (LANTUS) 100 UNIT/ML injection Inject 0.22 mLs (22 Units total) into the skin at bedtime. (Patient taking differently: Inject 15 Units into the skin at bedtime. )  . metoprolol succinate (TOPROL XL) 50 MG 24 hr tablet Take 1 tablet (50 mg total) by mouth daily. Take with or immediately  following a meal.  . multivitamin (RENA-VIT) TABS tablet Take 1 tablet by mouth at bedtime.  . norethindrone (MICRONOR,CAMILA,ERRIN) 0.35 MG tablet Take 1 tablet (0.35 mg total) by mouth daily.  . ondansetron (ZOFRAN ODT) 4 MG disintegrating tablet Take 1 tablet (4 mg total) by mouth every 8 (eight) hours as needed for nausea or vomiting.  . SENSIPAR 60 MG tablet Take 60 mg by mouth daily.     No Known Allergies  Social History   Socioeconomic History  . Marital status: Single    Spouse name: Not on file  . Number of children: 0  . Years of education: 32  . Highest education level: Not on file  Occupational History  . Occupation: Ship broker at [employer]    Comment: business administration  Social Needs  . Financial resource strain: Not on file  . Food insecurity:    Worry: Not on file    Inability: Not on file  . Transportation needs:    Medical: Not on file    Non-medical: Not on file  Tobacco Use  . Smoking status: Never Smoker  . Smokeless tobacco: Never Used  Substance and Sexual Activity  . Alcohol use: No    Alcohol/week: 0.0 standard drinks  . Drug use: No  . Sexual activity: Yes    Partners: Male    Birth control/protection: Pill, Condom  Lifestyle  . Physical activity:    Days per week: Not on file    Minutes per session:  Not on file  . Stress: Not on file  Relationships  . Social connections:    Talks on phone: Not on file    Gets together: Not on file    Attends religious service: Not on file    Active member of club or organization: Not on file    Attends meetings of clubs or organizations: Not on file    Relationship status: Not on file  . Intimate partner violence:    Fear of current or ex partner: Not on file    Emotionally abused: Not on file    Physically abused: Not on file    Forced sexual activity: Not on file  Other Topics Concern  . Not on file  Social History Narrative   Patient is single with no children.   Patient is right handed.   Patient  has hs education.   Patient does not drink caffeine.     Review of Systems: General: negative for chills, fever, night sweats or weight changes.  Cardiovascular: negative for chest pain, dyspnea on exertion, edema, orthopnea, palpitations, paroxysmal nocturnal dyspnea or shortness of breath Dermatological: negative for rash Respiratory: negative for cough or wheezing Urologic: negative for hematuria Abdominal: negative for nausea, vomiting, diarrhea, bright red blood per rectum, melena, or hematemesis Neurologic: negative for visual changes, syncope, or dizziness All other systems reviewed and are otherwise negative except as noted above.    Blood pressure (!) 149/87, pulse 79, height 5\' 1"  (1.549 m), weight 123 lb (55.8 kg).  General appearance: alert and no distress Neck: no adenopathy, no carotid bruit, no JVD, supple, symmetrical, trachea midline and thyroid not enlarged, symmetric, no tenderness/mass/nodules Lungs: clear to auscultation bilaterally Heart: regular rate and rhythm, S1, S2 normal, no murmur, click, rub or gallop Extremities: extremities normal, atraumatic, no cyanosis or edema Pulses: 2+ and symmetric Skin: Skin color, texture, turgor normal. No rashes or lesions Neurologic: Alert and oriented X 3, normal strength and tone. Normal symmetric reflexes. Normal coordination and gait  EKG not performed today  ASSESSMENT AND PLAN:   Essential hypertension History of essential hypertension blood pressure measured today at 149/87.  She is on amlodipine which has just been increased in dose by her PCP and Toprol.  Her blood pressure is elevated more on her off dialysis days.  She does check her blood pressure at home which usually runs 140/80 however it is elevated on her off dialysis days.  She knows to avoid salt.  Her blood pressure control is somewhat driven by how much is drawn off during dialysis.  DOE (dyspnea on exertion) History of dyspnea on exertion with recent  pneumonia back 06/26/2018 which resolved within several weeks.  She says she is more dyspneic on or off dialysis days probably be related to her volume status which improves when her fluid draws are increased.  A 2D echo performed back in 2015 (04/07/2014) was essentially normal.      Lorretta Harp MD [facility], [facility] 08/15/2018 9:33 AM

## 2018-08-15 NOTE — Assessment & Plan Note (Signed)
History of dyspnea on exertion with recent pneumonia back 06/26/2018 which resolved within several weeks.  She says she is more dyspneic on or off dialysis days probably be related to her volume status which improves when her fluid draws are increased.  A 2D echo performed back in 2015 (04/07/2014) was essentially normal.

## 2018-09-12 ENCOUNTER — Ambulatory Visit: Payer: Medicaid Other | Admitting: Podiatry

## 2018-10-23 ENCOUNTER — Observation Stay (HOSPITAL_COMMUNITY)
Admission: EM | Admit: 2018-10-23 | Discharge: 2018-10-24 | Disposition: A | Discharge status: Home / Self Care | Payer: Medicaid Other | Attending: Student | Admitting: Student

## 2018-10-23 ENCOUNTER — Encounter (HOSPITAL_COMMUNITY): Payer: Self-pay | Admitting: Emergency Medicine

## 2018-10-23 ENCOUNTER — Other Ambulatory Visit: Payer: Self-pay

## 2018-10-23 DIAGNOSIS — Z8674 Personal history of sudden cardiac arrest: Secondary | ICD-10-CM | POA: Diagnosis not present

## 2018-10-23 DIAGNOSIS — Z1159 Encounter for screening for other viral diseases: Secondary | ICD-10-CM | POA: Diagnosis not present

## 2018-10-23 DIAGNOSIS — D631 Anemia in chronic kidney disease: Secondary | ICD-10-CM | POA: Insufficient documentation

## 2018-10-23 DIAGNOSIS — E1010 Type 1 diabetes mellitus with ketoacidosis without coma: Secondary | ICD-10-CM

## 2018-10-23 DIAGNOSIS — E1022 Type 1 diabetes mellitus with diabetic chronic kidney disease: Secondary | ICD-10-CM | POA: Diagnosis not present

## 2018-10-23 DIAGNOSIS — Z8673 Personal history of transient ischemic attack (TIA), and cerebral infarction without residual deficits: Secondary | ICD-10-CM | POA: Diagnosis not present

## 2018-10-23 DIAGNOSIS — Z794 Long term (current) use of insulin: Secondary | ICD-10-CM | POA: Insufficient documentation

## 2018-10-23 DIAGNOSIS — Z992 Dependence on renal dialysis: Secondary | ICD-10-CM | POA: Diagnosis not present

## 2018-10-23 DIAGNOSIS — I120 Hypertensive chronic kidney disease with stage 5 chronic kidney disease or end stage renal disease: Secondary | ICD-10-CM | POA: Diagnosis not present

## 2018-10-23 DIAGNOSIS — Z79899 Other long term (current) drug therapy: Secondary | ICD-10-CM | POA: Insufficient documentation

## 2018-10-23 DIAGNOSIS — I10 Essential (primary) hypertension: Secondary | ICD-10-CM | POA: Diagnosis present

## 2018-10-23 DIAGNOSIS — N186 End stage renal disease: Secondary | ICD-10-CM | POA: Diagnosis not present

## 2018-10-23 DIAGNOSIS — E1065 Type 1 diabetes mellitus with hyperglycemia: Secondary | ICD-10-CM | POA: Diagnosis present

## 2018-10-23 LAB — BASIC METABOLIC PANEL
Anion gap: 19 — ABNORMAL HIGH (ref 5–15)
Anion gap: 17 — ABNORMAL HIGH (ref 5–15)
BUN: 52 mg/dL — ABNORMAL HIGH (ref 6–20)
BUN: 52 mg/dL — ABNORMAL HIGH (ref 6–20)
CO2: 22 mmol/L (ref 22–32)
CO2: 22 mmol/L (ref 22–32)
Calcium: 7.8 mg/dL — ABNORMAL LOW (ref 8.9–10.3)
Calcium: 8.4 mg/dL — ABNORMAL LOW (ref 8.9–10.3)
Chloride: 82 mmol/L — ABNORMAL LOW (ref 98–111)
Chloride: 93 mmol/L — ABNORMAL LOW (ref 98–111)
Creatinine, Ser: 10.76 mg/dL — ABNORMAL HIGH (ref 0.44–1.00)
Creatinine, Ser: 11.11 mg/dL — ABNORMAL HIGH (ref 0.44–1.00)
GFR calc Af Amer: 5 mL/min — ABNORMAL LOW (ref >60)
GFR calc Af Amer: 5 mL/min — ABNORMAL LOW (ref >60)
GFR calc non Af Amer: 4 mL/min — ABNORMAL LOW (ref >60)
GFR calc non Af Amer: 4 mL/min — ABNORMAL LOW (ref >60)
Glucose, Bld: 1098 mg/dL (ref 70–99)
Glucose, Bld: 447 mg/dL — ABNORMAL HIGH (ref 70–99)
Potassium: 5.2 mmol/L — ABNORMAL HIGH (ref 3.5–5.1)
Potassium: 4 mmol/L (ref 3.5–5.1)
Sodium: 123 mmol/L — ABNORMAL LOW (ref 135–145)
Sodium: 132 mmol/L — ABNORMAL LOW (ref 135–145)

## 2018-10-23 LAB — COMPREHENSIVE METABOLIC PANEL
ALT: 30 U/L (ref 0–44)
AST: 26 U/L (ref 15–41)
Albumin: 4.4 g/dL (ref 3.5–5.0)
Alkaline Phosphatase: 149 U/L — ABNORMAL HIGH (ref 38–126)
Anion gap: 21 — ABNORMAL HIGH (ref 5–15)
BUN: 49 mg/dL — ABNORMAL HIGH (ref 6–20)
CO2: 19 mmol/L — ABNORMAL LOW (ref 22–32)
Calcium: 8 mg/dL — ABNORMAL LOW (ref 8.9–10.3)
Chloride: 80 mmol/L — ABNORMAL LOW (ref 98–111)
Creatinine, Ser: 9.95 mg/dL — ABNORMAL HIGH (ref 0.44–1.00)
GFR calc Af Amer: 6 mL/min — ABNORMAL LOW (ref >60)
GFR calc non Af Amer: 5 mL/min — ABNORMAL LOW (ref >60)
Glucose, Bld: 1136 mg/dL (ref 70–99)
Potassium: 5.6 mmol/L — ABNORMAL HIGH (ref 3.5–5.1)
Sodium: 120 mmol/L — ABNORMAL LOW (ref 135–145)
Total Bilirubin: 1 mg/dL (ref 0.3–1.2)
Total Protein: 7.7 g/dL (ref 6.5–8.1)

## 2018-10-23 LAB — GLUCOSE, CAPILLARY
Glucose-Capillary: 103 mg/dL — ABNORMAL HIGH (ref 70–99)
Glucose-Capillary: 131 mg/dL — ABNORMAL HIGH (ref 70–99)
Glucose-Capillary: 168 mg/dL — ABNORMAL HIGH (ref 70–99)
Glucose-Capillary: 192 mg/dL — ABNORMAL HIGH (ref 70–99)
Glucose-Capillary: 216 mg/dL — ABNORMAL HIGH (ref 70–99)
Glucose-Capillary: 284 mg/dL — ABNORMAL HIGH (ref 70–99)
Glucose-Capillary: 42 mg/dL — CL (ref 70–99)
Glucose-Capillary: 425 mg/dL — ABNORMAL HIGH (ref 70–99)
Glucose-Capillary: 568 mg/dL (ref 70–99)
Glucose-Capillary: 76 mg/dL (ref 70–99)
Glucose-Capillary: 89 mg/dL (ref 70–99)
Glucose-Capillary: >600 mg/dL (ref 70–99)
Glucose-Capillary: >600 mg/dL (ref 70–99)
Glucose-Capillary: >600 mg/dL (ref 70–99)

## 2018-10-23 LAB — POCT I-STAT EG7
Acid-base deficit: 2 mmol/L (ref 0.0–2.0)
Bicarbonate: 23.2 mmol/L (ref 20.0–28.0)
Calcium, Ion: 0.89 mmol/L — CL (ref 1.15–1.40)
HCT: 38 % (ref 36.0–46.0)
Hemoglobin: 12.9 g/dL (ref 12.0–15.0)
O2 Saturation: 69 %
Potassium: 5.6 mmol/L — ABNORMAL HIGH (ref 3.5–5.1)
Sodium: 118 mmol/L — CL (ref 135–145)
TCO2: 24 mmol/L (ref 22–32)
pCO2, Ven: 42 mmHg — ABNORMAL LOW (ref 44.0–60.0)
pH, Ven: 7.351 (ref 7.250–7.430)
pO2, Ven: 38 mmHg (ref 32.0–45.0)

## 2018-10-23 LAB — SARS CORONAVIRUS 2 BY RT PCR (HOSPITAL ORDER, PERFORMED IN ~~LOC~~ HOSPITAL LAB): SARS Coronavirus 2: NEGATIVE

## 2018-10-23 LAB — CBC WITH DIFFERENTIAL/PLATELET
Abs Immature Granulocytes: 0.03 10*3/uL (ref 0.00–0.07)
Basophils Absolute: 0 10*3/uL (ref 0.0–0.1)
Basophils Relative: 1 %
Eosinophils Absolute: 0 10*3/uL (ref 0.0–0.5)
Eosinophils Relative: 0 %
HCT: 35 % — ABNORMAL LOW (ref 36.0–46.0)
Hemoglobin: 10.4 g/dL — ABNORMAL LOW (ref 12.0–15.0)
Immature Granulocytes: 0 %
Lymphocytes Relative: 5 %
Lymphs Abs: 0.5 10*3/uL — ABNORMAL LOW (ref 0.7–4.0)
MCH: 28.3 pg (ref 26.0–34.0)
MCHC: 29.7 g/dL — ABNORMAL LOW (ref 30.0–36.0)
MCV: 95.1 fL (ref 80.0–100.0)
Monocytes Absolute: 0.5 10*3/uL (ref 0.1–1.0)
Monocytes Relative: 5 %
Neutro Abs: 7.5 10*3/uL (ref 1.7–7.7)
Neutrophils Relative %: 89 %
Platelets: 188 10*3/uL (ref 150–400)
RBC: 3.68 MIL/uL — ABNORMAL LOW (ref 3.87–5.11)
RDW: 14.9 % (ref 11.5–15.5)
WBC: 8.5 10*3/uL (ref 4.0–10.5)
nRBC: 0 % (ref 0.0–0.2)

## 2018-10-23 LAB — CBG MONITORING, ED
Glucose-Capillary: >600 mg/dL (ref 70–99)
Glucose-Capillary: >600 mg/dL (ref 70–99)
Glucose-Capillary: >600 mg/dL (ref 70–99)

## 2018-10-23 LAB — I-STAT BETA HCG BLOOD, ED (MC, WL, AP ONLY): I-stat hCG, quantitative: <5 m[IU]/mL (ref <5)

## 2018-10-23 LAB — LIPASE, BLOOD: Lipase: 31 U/L (ref 11–51)

## 2018-10-23 MED ORDER — ZOLPIDEM TARTRATE 5 MG PO TABS: 5.0000 mg | ORAL_TABLET | Freq: Every evening | ORAL | Status: DC | PRN

## 2018-10-23 MED ORDER — INSULIN GLARGINE 100 UNIT/ML ~~LOC~~ SOLN
10.0000 [IU] | SUBCUTANEOUS | Status: DC
  Filled 2018-10-23: qty 0.1

## 2018-10-23 MED ORDER — METOCLOPRAMIDE HCL 5 MG/ML IJ SOLN
10.0000 mg | Freq: Once | INTRAMUSCULAR | Status: AC
  Administered 2018-10-23: 08:00:00 10 mg via INTRAVENOUS
  Filled 2018-10-23: qty 2

## 2018-10-23 MED ORDER — INSULIN REGULAR(HUMAN) IN NACL 100-0.9 UT/100ML-% IV SOLN
INTRAVENOUS | Status: DC
  Administered 2018-10-23: 09:00:00 5.4 [IU]/h via INTRAVENOUS
  Filled 2018-10-23: qty 100

## 2018-10-23 MED ORDER — SORBITOL 70 % SOLN
30.0000 mL | Status: DC | PRN
  Filled 2018-10-23: qty 30

## 2018-10-23 MED ORDER — HEPARIN SODIUM (PORCINE) 5000 UNIT/ML IJ SOLN
5000.0000 [IU] | Freq: Three times a day (TID) | INTRAMUSCULAR | Status: DC
  Administered 2018-10-23 – 2018-10-24 (×3): 5000 [IU] via SUBCUTANEOUS
  Filled 2018-10-23 (×3): qty 1

## 2018-10-23 MED ORDER — DEXTROSE 50 % IV SOLN
INTRAVENOUS | Status: AC
  Administered 2018-10-23: 18:00:00 25 g via INTRAVENOUS
  Filled 2018-10-23: qty 50

## 2018-10-23 MED ORDER — SODIUM CHLORIDE 0.9 % IV SOLN: INTRAVENOUS | Status: DC

## 2018-10-23 MED ORDER — INSULIN ASPART 100 UNIT/ML ~~LOC~~ SOLN
0.0000 [IU] | Freq: Three times a day (TID) | SUBCUTANEOUS | Status: DC
  Administered 2018-10-24: 09:00:00 7 [IU] via SUBCUTANEOUS
  Administered 2018-10-24: 1 [IU] via SUBCUTANEOUS

## 2018-10-23 MED ORDER — METOPROLOL SUCCINATE ER 25 MG PO TB24
50.0000 mg | ORAL_TABLET | Freq: Every day | ORAL | Status: DC
  Administered 2018-10-23 – 2018-10-24 (×2): 50 mg via ORAL
  Filled 2018-10-23 (×2): qty 2

## 2018-10-23 MED ORDER — DEXTROSE 50 % IV SOLN
25.0000 g | INTRAVENOUS | Status: AC
  Administered 2018-10-23: 18:00:00 25 g via INTRAVENOUS

## 2018-10-23 MED ORDER — CALCIUM ACETATE (PHOS BINDER) 667 MG PO CAPS
2001.0000 mg | ORAL_CAPSULE | Freq: Three times a day (TID) | ORAL | Status: DC
  Administered 2018-10-23 – 2018-10-24 (×2): 2001 mg via ORAL
  Filled 2018-10-23 (×3): qty 3

## 2018-10-23 MED ORDER — DIPHENHYDRAMINE HCL 50 MG/ML IJ SOLN
25.0000 mg | Freq: Once | INTRAMUSCULAR | Status: AC
  Administered 2018-10-23: 08:00:00 25 mg via INTRAVENOUS
  Filled 2018-10-23: qty 1

## 2018-10-23 MED ORDER — DEXTROSE-NACL 5-0.45 % IV SOLN: INTRAVENOUS | Status: DC

## 2018-10-23 MED ORDER — CINACALCET HCL 30 MG PO TABS
60.0000 mg | ORAL_TABLET | Freq: Every day | ORAL | Status: DC
  Administered 2018-10-23 – 2018-10-24 (×2): 60 mg via ORAL
  Filled 2018-10-23 (×2): qty 2

## 2018-10-23 MED ORDER — CALCIUM CARBONATE ANTACID 1250 MG/5ML PO SUSP
500.0000 mg | Freq: Four times a day (QID) | ORAL | Status: DC | PRN
  Filled 2018-10-23: qty 5

## 2018-10-23 MED ORDER — AMLODIPINE BESYLATE 5 MG PO TABS
5.0000 mg | ORAL_TABLET | Freq: Two times a day (BID) | ORAL | Status: DC
  Administered 2018-10-23 – 2018-10-24 (×3): 5 mg via ORAL
  Filled 2018-10-23 (×4): qty 1

## 2018-10-23 MED ORDER — NEPRO/CARBSTEADY PO LIQD
237.0000 mL | Freq: Three times a day (TID) | ORAL | Status: DC | PRN
  Filled 2018-10-23: qty 237

## 2018-10-23 MED ORDER — INSULIN ASPART 100 UNIT/ML ~~LOC~~ SOLN: 0.0000 [IU] | Freq: Every day | SUBCUTANEOUS | Status: DC

## 2018-10-23 MED ORDER — DOCUSATE SODIUM 283 MG RE ENEM
1.0000 | ENEMA | RECTAL | Status: DC | PRN
  Filled 2018-10-23: qty 1

## 2018-10-23 MED ORDER — ACETAMINOPHEN 650 MG RE SUPP: 650.0000 mg | Freq: Four times a day (QID) | RECTAL | Status: DC | PRN

## 2018-10-23 MED ORDER — NORETHINDRONE 0.35 MG PO TABS: 1.0000 | ORAL_TABLET | Freq: Every day | ORAL | Status: DC

## 2018-10-23 MED ORDER — DEXTROSE 5 % IV SOLN: INTRAVENOUS | Status: DC

## 2018-10-23 MED ORDER — ONDANSETRON HCL 4 MG PO TABS: 4.0000 mg | ORAL_TABLET | Freq: Four times a day (QID) | ORAL | Status: DC | PRN

## 2018-10-23 MED ORDER — ONDANSETRON HCL 4 MG/2ML IJ SOLN
4.0000 mg | Freq: Four times a day (QID) | INTRAMUSCULAR | Status: DC | PRN
  Filled 2018-10-23: qty 2

## 2018-10-23 MED ORDER — PROMETHAZINE HCL 25 MG/ML IJ SOLN
25.0000 mg | Freq: Four times a day (QID) | INTRAMUSCULAR | Status: DC | PRN
  Administered 2018-10-23: 18:00:00 25 mg via INTRAVENOUS
  Filled 2018-10-23: qty 1

## 2018-10-23 MED ORDER — ACETAMINOPHEN 325 MG PO TABS
650.0000 mg | ORAL_TABLET | Freq: Four times a day (QID) | ORAL | Status: DC | PRN
  Administered 2018-10-24: 09:00:00 650 mg via ORAL
  Filled 2018-10-23 (×2): qty 2

## 2018-10-23 MED ORDER — CAMPHOR-MENTHOL 0.5-0.5 % EX LOTN
1.0000 "application " | TOPICAL_LOTION | Freq: Three times a day (TID) | CUTANEOUS | Status: DC | PRN
  Filled 2018-10-23: qty 222

## 2018-10-23 MED ORDER — HYDROXYZINE HCL 25 MG PO TABS: 25.0000 mg | ORAL_TABLET | Freq: Three times a day (TID) | ORAL | Status: DC | PRN

## 2018-10-23 MED ORDER — INSULIN REGULAR(HUMAN) IN NACL 100-0.9 UT/100ML-% IV SOLN
INTRAVENOUS | Status: DC
  Administered 2018-10-23: 10:00:00 10.8 [IU]/h via INTRAVENOUS
  Filled 2018-10-23: qty 100

## 2018-10-23 MED ORDER — SODIUM CHLORIDE 0.9 % IV BOLUS
250.0000 mL | Freq: Once | INTRAVENOUS | Status: AC
  Administered 2018-10-23: 08:00:00 250 mL via INTRAVENOUS

## 2018-10-23 NOTE — ED Notes (Signed)
ED TO INPATIENT HANDOFF REPORT  ED Nurse Name and Phone #: [phone]  S Name/Age/Gender [name] 28 y.o. female Room/Bed: 019C/019C  Code Status   Code Status: Full Code  Home/SNF/Other Home Patient oriented to: self, place, time and situation Is this baseline? Yes   Triage Complete: Triage complete  Chief Complaint Vomitting; Dialysis Pt  Triage Note Pt arrives via EMS from home. Pt reports vomiting since Saturday, last full treatment HD Saturday. CBG reading high. HTN for EMS. Pt states she has been unable to keep medications down since Saturday.   Allergies No Known Allergies  Level of Care/Admitting Diagnosis ED Disposition    ED Disposition Condition [place]: [place] [[id number]]  Level of Care: Progressive [102]  I expect the patient will be discharged within 24 hours: Yes  LOW acuity---Tx typically complete <24 hrs---ACUTE conditions typically can be evaluated <24 hours---LABS likely to return to acceptable levels <24 hours---IS near functional baseline---EXPECTED to return to current living arrangement---NOT newly hypoxic: Does not meet criteria for 5C-Observation unit  Covid Evaluation: Screening Protocol (No Symptoms)  Diagnosis: Hyperglycemia due to type 1 diabetes mellitus [facility]) [1950932]  Admitting Physician: Karmen Bongo [[id number]]  Attending Physician: Karmen Bongo [[id number]]  PT Class (Do Not Modify): Observation [104]  PT Acc Code (Do Not Modify): Observation [10022]       B Medical/Surgery History Past Medical History:  Diagnosis Date  . Cardiac arrest ([place])   . Complication of anesthesia   . Diabetes mellitus type 1 ([place])    2005 dx [facility]  . DKA (diabetic ketoacidoses) ([place])   . ESRD (end stage renal disease) ([facility])    TTS HD, dialyzed at [facility]  . Hypertension   . PONV (postoperative nausea and vomiting)   . Stroke [facility])    Past Surgical History:  Procedure Laterality  Date  . CATARACT EXTRACTION     6 years ago  . DIALYSIS FISTULA CREATION    . IR FLUORO GUIDE CV LINE RIGHT  01/26/2017  . IR REMOVAL TUN CV CATH W/O FL  05/16/2017  . IR US GUIDE VASC ACCESS RIGHT  01/26/2017     A IV Location/Drains/Wounds Patient Lines/Drains/Airways Status   Active Line/Drains/Airways    Name:   Placement date:   Placement time:   Site:   Days:   Peripheral IV 10/23/18 Right Antecubital   10/23/18    0758    Antecubital   less than 1   Fistula / Graft Left Upper arm Arteriovenous fistula   -    -    Upper arm      Hemodialysis Catheter Right Internal jugular Double-lumen;Permanent   01/26/17    1500    Internal jugular   635          Intake/Output Last 24 hours  Intake/Output Summary (Last 24 hours) at 10/23/2018 0949 Last data filed at 10/23/2018 6712 Gross per 24 hour  Intake 250 ml  Output -  Net 250 ml    Labs/Imaging Results for orders placed or performed during the hospital encounter of 10/23/18 (from the past 48 hour(s))  CBG monitoring, ED     Status: Abnormal   Collection Time: 10/23/18  7:28 AM  Result Value Ref Range   Glucose-Capillary >600 (HH) 70 - 99 mg/dL  CBC with Differential     Status: Abnormal   Collection Time: 10/23/18  7:32 AM  Result Value Ref Range   WBC  8.5 4.0 - 10.5 K/uL   RBC 3.68 (L) 3.87 - 5.11 MIL/uL   Hemoglobin 10.4 (L) 12.0 - 15.0 g/dL   HCT 35.0 (L) 36.0 - 46.0 %   MCV 95.1 80.0 - 100.0 fL   MCH 28.3 26.0 - 34.0 pg   MCHC 29.7 (L) 30.0 - 36.0 g/dL   RDW 14.9 11.5 - 15.5 %   Platelets 188 150 - 400 K/uL   nRBC 0.0 0.0 - 0.2 %   Neutrophils Relative % 89 %   Neutro Abs 7.5 1.7 - 7.7 K/uL   Lymphocytes Relative 5 %   Lymphs Abs 0.5 (L) 0.7 - 4.0 K/uL   Monocytes Relative 5 %   Monocytes Absolute 0.5 0.1 - 1.0 K/uL   Eosinophils Relative 0 %   Eosinophils Absolute 0.0 0.0 - 0.5 K/uL   Basophils Relative 1 %   Basophils Absolute 0.0 0.0 - 0.1 K/uL   Immature Granulocytes 0 %   Abs Immature Granulocytes 0.03  0.00 - 0.07 K/uL    Comment: Performed at [facility] [address], [place], [place] [address]  Comprehensive metabolic panel     Status: Abnormal   Collection Time: 10/23/18  7:32 AM  Result Value Ref Range   Sodium 120 (L) 135 - 145 mmol/L   Potassium 5.6 (H) 3.5 - 5.1 mmol/L   Chloride 80 (L) 98 - 111 mmol/L   CO2 19 (L) 22 - 32 mmol/L   Glucose, Bld 1,136 (HH) 70 - 99 mg/dL    Comment: RESULTS CONFIRMED BY MANUAL DILUTION CRITICAL RESULT CALLED TO, READ BACK BY AND VERIFIED WITH: Ivin Poot RN [phone] [id number] BY A BENNETT    BUN 49 (H) 6 - 20 mg/dL   Creatinine, Ser 9.95 (H) 0.44 - 1.00 mg/dL   Calcium 8.0 (L) 8.9 - 10.3 mg/dL   Total Protein 7.7 6.5 - 8.1 g/dL   Albumin 4.4 3.5 - 5.0 g/dL   AST 26 15 - 41 U/L   ALT 30 0 - 44 U/L   Alkaline Phosphatase 149 (H) 38 - 126 U/L   Total Bilirubin 1.0 0.3 - 1.2 mg/dL   GFR calc non Af Amer 5 (L) >60 mL/min   GFR calc Af Amer 6 (L) >60 mL/min   Anion gap 21 (H) 5 - 15    Comment: Performed at [facility], [place] [address], [place], [place] [address]  Lipase, blood     Status: None   Collection Time: 10/23/18  7:32 AM  Result Value Ref Range   Lipase 31 11 - 51 U/L    Comment: Performed at [facility] [address] [place],  [address]  POCT I-Stat EG7     Status: Abnormal   Collection Time: 10/23/18  7:35 AM  Result Value Ref Range   pH, Ven 7.351 7.250 - 7.430   pCO2, Ven 42.0 (L) 44.0 - 60.0 mmHg   pO2, Ven 38.0 32.0 - 45.0 mmHg   Bicarbonate 23.2 20.0 - 28.0 mmol/L   TCO2 24 22 - 32 mmol/L   O2 Saturation 69.0 %   Acid-base deficit 2.0 0.0 - 2.0 mmol/L   Sodium 118 (LL) 135 - 145 mmol/L   Potassium 5.6 (H) 3.5 - 5.1 mmol/L   Calcium, Ion 0.89 (LL) 1.15 - 1.40 mmol/L   HCT 38.0 36.0 - 46.0 %   Hemoglobin 12.9 12.0 - 15.0 g/dL   Patient temperature HIDE    Sample type VENOUS  Comment NOTIFIED PHYSICIAN   I-Stat Beta hCG blood, ED ([facility], [facility], [facility] only)     Status: None   Collection Time:  10/23/18  7:38 AM  Result Value Ref Range   I-stat hCG, quantitative <5.0 <5 mIU/mL   Comment 3            Comment:   GEST. AGE      CONC.  (mIU/mL)   <=1 WEEK        5 - 50     2 WEEKS       50 - 500     3 WEEKS       100 - 10,000     4 WEEKS     1,000 - 30,000        FEMALE AND NON-PREGNANT FEMALE:     LESS THAN 5 mIU/mL   CBG monitoring, ED     Status: Abnormal   Collection Time: 10/23/18  8:55 AM  Result Value Ref Range   Glucose-Capillary >600 (HH) 70 - 99 mg/dL   No results found.  Pending Labs Unresulted Labs (From admission, onward)    Start     Ordered   10/23/18 1017  Basic metabolic panel  STAT Now then every 4 hours ,   STAT     10/23/18 0937          Vitals/Pain Today's Vitals   10/23/18 0716 10/23/18 0802 10/23/18 0830 10/23/18 0900  BP:  (!) 159/92 (!) 168/97 (!) 155/81  Pulse:  87 86 84  Resp:  19 15 17   Temp:      TempSrc:      SpO2:  99% 99% 97%  Weight: 56.7 kg     Height: 5\' 1"  (1.549 m)     PainSc: 9        Isolation Precautions No active isolations  Medications Medications  amLODipine (NORVASC) tablet 5 mg (has no administration in time range)  metoprolol succinate (TOPROL-XL) 24 hr tablet 50 mg (has no administration in time range)  norethindrone (MICRONOR) 0.35 MG tablet 0.35 mg (has no administration in time range)  cinacalcet (SENSIPAR) tablet 60 mg (has no administration in time range)  calcium acetate (PHOSLO) capsule 2,001 mg (has no administration in time range)  insulin regular, human (MYXREDLIN) 100 units/ 100 mL infusion (has no administration in time range)  acetaminophen (TYLENOL) tablet 650 mg (has no administration in time range)    Or  acetaminophen (TYLENOL) suppository 650 mg (has no administration in time range)  zolpidem (AMBIEN) tablet 5 mg (has no administration in time range)  sorbitol 70 % solution 30 mL (has no administration in time range)  docusate sodium (ENEMEEZ) enema 283 mg (has no administration in time  range)  ondansetron (ZOFRAN) tablet 4 mg (has no administration in time range)    Or  ondansetron (ZOFRAN) injection 4 mg (has no administration in time range)  camphor-menthol (SARNA) lotion 1 application (has no administration in time range)    And  hydrOXYzine (ATARAX/VISTARIL) tablet 25 mg (has no administration in time range)  calcium carbonate (dosed in mg elemental calcium) suspension 500 mg of elemental calcium (has no administration in time range)  feeding supplement (NEPRO CARB STEADY) liquid 237 mL (has no administration in time range)  heparin injection 5,000 Units (has no administration in time range)  dextrose 5 % solution (has no administration in time range)  metoCLOPramide (REGLAN) injection 10 mg (10 mg Intravenous Given 10/23/18 0758)  diphenhydrAMINE (BENADRYL) injection 25  mg (25 mg Intravenous Given 10/23/18 0758)  sodium chloride 0.9 % bolus 250 mL (0 mLs Intravenous Stopped 10/23/18 0839)    Mobility walks Low fall risk   Focused Assessments Renal Assessment Handoff:  Hemodialysis Schedule: Hemodialysis Schedule: Tuesday/Thursday/Saturday Last Hemodialysis date and time: Saturday   Restricted appendage: left arm     R Recommendations: See Admitting Provider Note  Report given to:   Additional Notes:  DKA

## 2018-10-23 NOTE — Progress Notes (Signed)
DKA Management - Sidebar for Nursing  Reminder: Only one phase of DKA order set should be ordered at a time   1.  Administer IVF bolus, if ordered  2.  Once IV bolus complete, order stat BMET and call results to MD  3.  Start Maintenance IV fluids as ordered              a.  If Blood Glucose >/= 250 mg/dL, Start NS at ordered rate             b.  If Blood Glucose <250 mg/dL, Start or switch to D5-1/2 NS per ordered rate  4.  Monitor BG's (Blood Glucose) hourly  5.  Start IV insulin per [employer]              a.  High target= 180 mg/dL, Low target=140 mg/dL, Multiplier=0.01             b.  Do not use Lab glucose             c.  If BG reads critical high, enter 601-Do not enter 601 more than 3 hours in a row.  If BG remains HIGH, restart GlucoStabilizer with original multiplier of 0.01             d.  If insulin infusion rate greater than 30 units/hr, stop insulin infusion for 30 minutes and restart GlucoStabilizer with original multiplier of 0.01             e.  If BG < 70 mg/dL:                         i.  Follow GlucoStabilizer instructions                         ii.  Change multiplier to 0.01             f.  Notify MD if:                         i.  Insulin infusion rate less than 1.0 units/ hour                         ii.  CBG less than 70 mg/dL after treating hypoglycemia twice  6.  BMET every 4 hours until acidosis cleared-ALL BMETS SHOULD BE CALLED TO MD  7.  Electrolytes             a.  Follow initial orders for K replacement             b.  If K<3.3, consider holding IV insulin until K replaced and greater than 3.3-RN will need to discuss with ordering MD.             c.  Subsequent K orders, will need to be ordered by MD when q 4 hour BMET's called  8.  IV insulin infusion with sufficient glucose should be continued until acidosis is corrected as determined by MD .  9.  While on IV insulin, if patient is eating cover carbohydrate (CHO) intake  using IV insulin/CHO coverage via Glucostabilizer (1 unit for every 10 grams of CHO)  10.  Transition to SQ insulin-MD will initiate Phase 2 of DKA Protocol for transition off insulin drip.  11.  RN discontinue insulin drip 2 hours after subcutaneous basal insulin given and simultaneously  give Novolog correction scale.  12.  DKA Flowchart attached - DKA Flowchart   Anion Gap Formula: Na- (Cl + CO2)                              Normal range= 8-12 meq/L

## 2018-10-23 NOTE — Progress Notes (Signed)
Pt admitted for hyperglycemia, had hypoglycemic event earlier. Started on continues D5% @ 75, and CBG Q 1 hr. Last CBG 284.  Baltazar Najjar, NP notified.

## 2018-10-23 NOTE — H&P (Signed)
History and Physical    [name] NOM:[id number] DOB: [birth year] DOA: 10/23/2018  PCP: Shelby Mattocks, PA-C Consultants:  Gwenlyn Found - cardiology; [facility]; Leonides Schanz - endocrinology Patient coming from:  Home - lives alone; [facility]: Mother, [phone]  Chief Complaint: Emesis  HPI: [name] is a 28 y.o. female with medical history significant of type 1 DM; ESRD on TTS HD; h/o cardiac arrest; and HTN presenting with emesis.  She has been vomiting since Saturday but she didn't have any high blood sugars at all until this AM.  Reports checking TID.  She felt bad after HD Saturday but felt well prior.  She did have a complete session that day.  No diarrhea.  She does not know her last A1c (it was 9.0 in January).  She was having trouble getting sick after HD consistently and they thought it was related to her filter.  She had a change in filters prior but her mother thinks they may have used the wrong filter again.   ED Course:  Reports "well controlled" blood sugars in 100 range, currently 1100.  Vomiting a lot.  Appears to be DKA.  Review of Systems: As per HPI; otherwise review of systems reviewed and negative.   Ambulatory Status:  Ambulates without assistance  Past Medical History:  Diagnosis Date  . Cardiac arrest ([place])   . Complication of anesthesia   . Diabetes mellitus type 1 ([place])    2005 dx [facility]  . DKA (diabetic ketoacidoses) ([place])   . ESRD (end stage renal disease) ([facility])    TTS HD, dialyzed at [facility]  . Hypertension   . PONV (postoperative nausea and vomiting)   . Stroke [facility])     Past Surgical History:  Procedure Laterality Date  . CATARACT EXTRACTION     6 years ago  . DIALYSIS FISTULA CREATION    . IR FLUORO GUIDE CV LINE RIGHT  01/26/2017  . IR REMOVAL TUN CV CATH W/O FL  05/16/2017  . IR US GUIDE VASC ACCESS RIGHT  01/26/2017    Social History   Socioeconomic History  . Marital status: Single    Spouse name: Not  on file  . Number of children: 0  . Years of education: 46  . Highest education level: Not on file  Occupational History  . Occupation: Ship broker at [employer]    Comment: business administration  Social Needs  . Financial resource strain: Not on file  . Food insecurity:    Worry: Not on file    Inability: Not on file  . Transportation needs:    Medical: Not on file    Non-medical: Not on file  Tobacco Use  . Smoking status: Never Smoker  . Smokeless tobacco: Never Used  Substance and Sexual Activity  . Alcohol use: No    Alcohol/week: 0.0 standard drinks  . Drug use: No  . Sexual activity: Yes    Partners: Male    Birth control/protection: Pill, Condom  Lifestyle  . Physical activity:    Days per week: Not on file    Minutes per session: Not on file  . Stress: Not on file  Relationships  . Social connections:    Talks on phone: Not on file    Gets together: Not on file    Attends religious service: Not on file    Active member of club or organization: Not on file    Attends meetings of clubs or organizations: Not on file  Relationship status: Not on file  . Intimate partner violence:    Fear of current or ex partner: Not on file    Emotionally abused: Not on file    Physically abused: Not on file    Forced sexual activity: Not on file  Other Topics Concern  . Not on file  Social History Narrative   Patient is single with no children.   Patient is right handed.   Patient has hs education.   Patient does not drink caffeine.    No Known Allergies  Family History  Problem Relation Age of Onset  . Diabetes Maternal Grandmother   . Stroke Maternal Grandmother   . Cancer Maternal Aunt        aunt, passed last month, ?RCC  . Cancer Maternal Uncle     Prior to Admission medications   Medication Sig Start Date End Date Taking? Authorizing Provider  amLODipine (NORVASC) 5 MG tablet One bid Patient taking differently: Take 5 mg by mouth 2 (two) times daily.  07/13/18    Rodriguez-Southworth, Sunday Spillers, PA-C  B-D ULTRAFINE III SHORT PEN 31G X 8 MM MISC USE WITH INSULIN FOUR TIMES DAILY 05/27/18   [provider]  calcium acetate (PHOSLO) 667 MG capsule Take 3 capsules (2,001 mg total) by mouth 3 (three) times daily with meals. 05/16/17   Rai, Vernelle Emerald, MD  Famotidine 20 MG CHEW Chew 20 mg by mouth as needed for heartburn or indigestion.    [provider]  insulin aspart (NOVOLOG) 100 UNIT/ML injection Inject 0-9 Units into the skin 3 (three) times daily with meals. Patient taking differently: Inject 3 Units into the skin 3 (three) times daily with meals.  01/29/17   Georgette Shell, MD  insulin glargine (LANTUS) 100 UNIT/ML injection Inject 0.22 mLs (22 Units total) into the skin at bedtime. Patient taking differently: Inject 15 Units into the skin at bedtime.  03/22/18   Ghimire, Henreitta Leber, MD  metoprolol succinate (TOPROL XL) 50 MG 24 hr tablet Take 1 tablet (50 mg total) by mouth daily. Take with or immediately following a meal. 07/24/18 07/24/19  Rodriguez-Southworth, Sunday Spillers, PA-C  multivitamin (RENA-VIT) TABS tablet Take 1 tablet by mouth at bedtime. 01/29/17   Georgette Shell, MD  norethindrone (MICRONOR,CAMILA,ERRIN) 0.35 MG tablet Take 1 tablet (0.35 mg total) by mouth daily. 01/29/17   Georgette Shell, MD  ondansetron (ZOFRAN ODT) 4 MG disintegrating tablet Take 1 tablet (4 mg total) by mouth every 8 (eight) hours as needed for nausea or vomiting. 07/19/18   Antonietta Breach, PA-C  SENSIPAR 60 MG tablet Take 60 mg by mouth daily. 03/16/18   [provider]    Physical Exam: Vitals:   10/23/18 0930 10/23/18 1000 10/23/18 1053 10/23/18 1204  BP: (!) 144/78 (!) 142/76  (!) 161/93  Pulse: 85 86  92  Resp: 18 17  16   Temp:   99.1 F (37.3 C) 99.3 F (37.4 C)  TempSrc:   Oral Oral  SpO2: 100% 99%    Weight:      Height:         . General:  Appears ill but is in NAD . Eyes:  PERRL, EOMI, normal lids, iris . ENT:  grossly  normal hearing, lips & tongue, dry mm . Neck:  no LAD, masses or thyromegaly . Cardiovascular:  RRR, no m/r/g. No LE edema.  Marland Kitchen Respiratory:   CTA bilaterally with no wheezes/rales/rhonchi.  Normal respiratory effort. . Abdomen:  soft, NT, ND,  NABS . Skin:  no rash or induration seen on limited exam . Musculoskeletal:  grossly normal tone BUE/BLE, good ROM, no bony abnormality . Psychiatric:  Blunted mood and affect, speech fluent and appropriate, AOx3 . Neurologic:  CN 2-12 grossly intact, moves all extremities in coordinated fashion, sensation intact    Radiological Exams on Admission: No results found.  EKG: Independently reviewed.  NSR with rate 84; IVCD; nonspecific ST changes with no evidence of acute ischemia   Labs on Admission: I have personally reviewed the available labs and imaging studies at the time of the admission.  Pertinent labs:   VBG: 7.351/42.0/23.2 Na++ 120 CO2 19 Glucose 1136 BUN 49/Creatinine 9.95/GFR 6 Anion gap 21 Ionized calcium 0.89 AP 149 WBC 8.5 Hgb 10.4 HCG <5  Assessment/Plan Principal Problem:   Hyperglycemia due to type 1 diabetes mellitus (HCC) Active Problems:   ESRD (end stage renal disease) ([place])   Essential hypertension   Hyperglycemia with type 1 DM -Patient with poor baseline control (A1c on 1/7 was 9.0, will repeat) -She began with n/v following HD Saturday - ? Related to filter - and has been unable to tolerate PO since and so not using insulin -No indication of illness as source; screening COVID testing pending but low suspicion for disease -Mild DKA vs. HONK -Will admit to SDU with DKA protocol -She has normalized through the day and so will transition to 10 units Lantus at this time with D5 infusion at 75 cc/hr until stabilized off insulin drip -K+ slightly increased at time of presentation and so K+ not added -Diabetes coordinator consult  ESRD on TTS HD -Patient on chronic TTS HD -Nephrology prn order set utilized  -She does not appear to be volume overloaded or otherwise in need of acute HD -Dr. Jonnie Finner has been notified that patient will need HD tomorrow -Continue Phoslo and Sensipar  HTN -Continue Norvasc and Toprol XL  Note: This patient has been tested and her result is pending for the novel coronavirus COVID-19.  DVT prophylaxis: Heparin Code Status:  Full  Family Communication: None present; I called and discussed the patient with her mother  Disposition Plan:  Home once clinically improved Consults called: Nephrology  Admission status: It is my clinical opinion that referral for OBSERVATION is reasonable and necessary in this patient based on the above information provided. The aforementioned taken together are felt to place the patient at high risk for further clinical deterioration. However it is anticipated that the patient may be medically stable for discharge from the hospital within 24 to 48 hours.    Karmen Bongo MD [facility]   How to contact the [facility] Attending or Consulting provider [place] or covering provider during after hours [place], for this patient?  1. Check the care team in [facility] and look for a) attending/consulting [facility] provider listed and b) the [facility] team listed 2. Log into [email or web address] and use [place]'s universal password to access. If you do not have the password, please contact the hospital operator. 3. Locate the [facility] provider you are looking for under [facility] and page to a number that you can be directly reached. 4. If you still have difficulty reaching the provider, please page the [facility] (Director on Call) for the Hospitalists listed on amion for assistance.   10/23/2018, 5:23 PM

## 2018-10-23 NOTE — ED Provider Notes (Signed)
[facility] EMERGENCY DEPARTMENT Provider Note   CSN: [id number] Arrival date & time: 10/23/18  0705    History   Chief Complaint Chief Complaint  Patient presents with  . Emesis    HPI [name] is a 28 y.o. female.     28 yo F with a chief complaint of nausea and vomiting.  Going on for the past couple days.  No fevers no chills no diarrhea no abdominal tenderness.  She denies cough or congestion denies shortness of breath.  No urinary symptoms denies likelihood of being pregnant.  The history is provided by the patient.  Emesis  Severity:  Moderate Duration:  2 days Timing:  Constant Progression:  Worsening Chronicity:  New Recent urination:  Normal Context: not post-tussive and not self-induced   Relieved by:  Nothing Worsened by:  Nothing Ineffective treatments:  Antiemetics (zofran) Associated symptoms: no arthralgias, no chills, no fever, no headaches and no myalgias     Past Medical History:  Diagnosis Date  . Cardiac arrest ([place])   . Complication of anesthesia   . Diabetes mellitus type 1 ([place])    2005 dx [facility]  . DKA (diabetic ketoacidoses) ([place])   . ESRD (end stage renal disease) (H[facility])    TTS HD, dialyzed at [facility]  . Hypertension   . PONV (postoperative nausea and vomiting)   . Stroke [facility])     Patient Active Problem List   Diagnosis Date Noted  . Hyperglycemia due to type 1 diabetes mellitus ([place]) 10/23/2018  . Essential hypertension 08/15/2018  . Lobar pneumonia ([place]) 06/27/2018  . Dizziness 03/22/2018  . Multifocal pneumonia 05/14/2017  . Normocytic normochromic anemia 01/26/2017  . ESRD (end stage renal disease) ([place]) 01/26/2017  . Fluid overload 01/26/2017  . Depression 07/04/2014  . Hemodialysis-associated hypotension 04/24/2014  . Cerebral infarction due to thrombosis of right middle cerebral artery ([place]) 04/24/2014  . History of anemia 04/17/2014  . Cardiac arrest ([place]) 04/14/2014  .  Diabetic neuropathy ([place]) 02/14/2014  . DKA, type 1 ([place]) 02/12/2014  . Tachycardia 02/12/2014  . DOE (dyspnea on exertion) 02/12/2014  . Abdominal pain 02/12/2014  . IDDM (insulin dependent diabetes mellitus) ([place]) 02/12/2014  . 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 Hypokalemia 12/07/2013  . Noncompliance 12/06/2013  . DKA (diabetic ketoacidoses) ([place]) 12/05/2013  . Leukocytosis 12/05/2013  . Acute on chronic renal failure ([place]) 12/05/2013  . Hypercalcemia 12/05/2013  . Abnormal LFTs 12/05/2013  . Elevated lipase 12/05/2013    Past Surgical History:  Procedure Laterality Date  . CATARACT EXTRACTION     6 years ago  . DIALYSIS FISTULA CREATION    . IR FLUORO GUIDE CV LINE RIGHT  01/26/2017  . IR REMOVAL TUN CV CATH W/O FL  05/16/2017  . IR US GUIDE VASC ACCESS RIGHT  01/26/2017     OB History   No obstetric history on file.      Home Medications    Prior to Admission medications   Medication Sig Start Date End Date Taking? Authorizing Provider  amLODipine (NORVASC) 5 MG tablet One bid Patient taking differently: Take 5 mg by mouth 2 (two) times daily.  07/13/18  Yes Rodriguez-Southworth, Sunday Spillers, PA-C  calcium acetate (PHOSLO) 667 MG capsule Take 3 capsules (2,001 mg total) by mouth 3 (three) times daily with meals. Patient taking differently: Take 1,334 mg by mouth 3 (three) times daily with meals.  05/16/17  Yes Rai, Vernelle Emerald, MD  Famotidine 20 MG CHEW Chew 20 mg by mouth as needed for heartburn or indigestion.   Yes [provider]  insulin aspart (NOVOLOG) 100 UNIT/ML injection Inject 0-9 Units into the skin 3 (three) times daily with meals. Patient taking differently: Inject 3 Units into the skin 3 (three) times daily with meals.  01/29/17  Yes Georgette Shell, MD  insulin glargine (LANTUS) 100 UNIT/ML injection Inject 0.22 mLs (22 Units total) into the skin at bedtime. Patient taking differently: Inject 15  Units into the skin at bedtime.  03/22/18  Yes Ghimire, Henreitta Leber, MD  metoprolol succinate (TOPROL XL) 50 MG 24 hr tablet Take 1 tablet (50 mg total) by mouth daily. Take with or immediately following a meal. 07/24/18 07/24/19 Yes Rodriguez-Southworth, Sunday Spillers, PA-C  multivitamin (RENA-VIT) TABS tablet Take 1 tablet by mouth at bedtime. 01/29/17  Yes Georgette Shell, MD  norethindrone (MICRONOR,CAMILA,ERRIN) 0.35 MG tablet Take 1 tablet (0.35 mg total) by mouth daily. 01/29/17  Yes Georgette Shell, MD  ondansetron (ZOFRAN ODT) 4 MG disintegrating tablet Take 1 tablet (4 mg total) by mouth every 8 (eight) hours as needed for nausea or vomiting. 07/19/18  Yes Antonietta Breach, PA-C  SENSIPAR 60 MG tablet Take 60 mg by mouth daily. 03/16/18  Yes [provider]    Family History Family History  Problem Relation Age of Onset  . Diabetes Maternal Grandmother   . Stroke Maternal Grandmother   . Cancer Maternal Aunt        aunt, passed last month, ?RCC  . Cancer Maternal Uncle     Social History Social History   Tobacco Use  . Smoking status: Never Smoker  . Smokeless tobacco: Never Used  Substance Use Topics  . Alcohol use: No    Alcohol/week: 0.0 standard drinks  . Drug use: No     Allergies   Patient has no known allergies.   Review of Systems Review of Systems  Constitutional: Negative for chills and fever.  HENT: Negative for congestion and rhinorrhea.   Eyes: Negative for redness and visual disturbance.  Respiratory: Negative for shortness of breath and wheezing.   Cardiovascular: Negative for chest pain and palpitations.  Gastrointestinal: Positive for vomiting. Negative for nausea.  Genitourinary: Negative for dysuria and urgency.  Musculoskeletal: Negative for arthralgias and myalgias.  Skin: Negative for pallor and wound.  Neurological: Negative for dizziness and headaches.     Physical Exam Updated Vital Signs BP (!) 161/93 (BP Location: Right Arm)    Pulse 92   Temp 99.3 F (37.4 C) (Oral)   Resp 16   Ht 5\' 1"  (1.549 m)   Wt 56.7 kg   LMP 09/25/2018   SpO2 99%   BMI 23.62 kg/m   Physical Exam Vitals signs and nursing note reviewed.  Constitutional:      General: She is not in acute distress.    Appearance: She is well-developed. She is not diaphoretic.  HENT:     Head: Normocephalic and atraumatic.  Eyes:     Pupils: Pupils are equal, round, and reactive to light.  Neck:     Musculoskeletal: Normal range of motion and neck supple.  Cardiovascular:     Rate and Rhythm: Normal rate and regular rhythm.     Heart sounds: No murmur. No friction rub. No gallop.   Pulmonary:     Effort: Pulmonary effort is normal.     Breath sounds: No wheezing or rales.  Abdominal:     General:  There is no distension.     Palpations: Abdomen is soft.     Tenderness: There is no abdominal tenderness.  Musculoskeletal:        General: No tenderness.  Skin:    General: Skin is warm and dry.  Neurological:     Mental Status: She is alert and oriented to person, place, and time.  Psychiatric:        Behavior: Behavior normal.      ED Treatments / Results  Labs (all labs ordered are listed, but only abnormal results are displayed) Labs Reviewed  CBC WITH DIFFERENTIAL/PLATELET - Abnormal; Notable for the following components:      Result Value   RBC 3.68 (*)    Hemoglobin 10.4 (*)    HCT 35.0 (*)    MCHC 29.7 (*)    Lymphs Abs 0.5 (*)    All other components within normal limits  COMPREHENSIVE METABOLIC PANEL - Abnormal; Notable for the following components:   Sodium 120 (*)    Potassium 5.6 (*)    Chloride 80 (*)    CO2 19 (*)    Glucose, Bld 1,136 (*)    BUN 49 (*)    Creatinine, Ser 9.95 (*)    Calcium 8.0 (*)    Alkaline Phosphatase 149 (*)    GFR calc non Af Amer 5 (*)    GFR calc Af Amer 6 (*)    Anion gap 21 (*)    All other components within normal limits  BASIC METABOLIC PANEL - Abnormal; Notable for the following  components:   Sodium 123 (*)    Potassium 5.2 (*)    Chloride 82 (*)    Glucose, Bld 1,098 (*)    BUN 52 (*)    Creatinine, Ser 10.76 (*)    Calcium 7.8 (*)    GFR calc non Af Amer 4 (*)    GFR calc Af Amer 5 (*)    Anion gap 19 (*)    All other components within normal limits  GLUCOSE, CAPILLARY - Abnormal; Notable for the following components:   Glucose-Capillary >600 (*)    All other components within normal limits  GLUCOSE, CAPILLARY - Abnormal; Notable for the following components:   Glucose-Capillary >600 (*)    All other components within normal limits  GLUCOSE, CAPILLARY - Abnormal; Notable for the following components:   Glucose-Capillary >600 (*)    All other components within normal limits  GLUCOSE, CAPILLARY - Abnormal; Notable for the following components:   Glucose-Capillary 568 (*)    All other components within normal limits  GLUCOSE, CAPILLARY - Abnormal; Notable for the following components:   Glucose-Capillary 425 (*)    All other components within normal limits  CBG MONITORING, ED - Abnormal; Notable for the following components:   Glucose-Capillary >600 (*)    All other components within normal limits  POCT I-STAT EG7 - Abnormal; Notable for the following components:   pCO2, Ven 42.0 (*)    Sodium 118 (*)    Potassium 5.6 (*)    Calcium, Ion 0.89 (*)    All other components within normal limits  CBG MONITORING, ED - Abnormal; Notable for the following components:   Glucose-Capillary >600 (*)    All other components within normal limits  CBG MONITORING, ED - Abnormal; Notable for the following components:   Glucose-Capillary >600 (*)    All other components within normal limits  LIPASE, BLOOD  BASIC METABOLIC PANEL  BASIC METABOLIC PANEL  BASIC METABOLIC PANEL  BASIC METABOLIC PANEL  I-STAT BETA HCG BLOOD, ED ([facility], [facility], [facility] ONLY)    EKG EKG Interpretation  Date/Time:  Monday Oct 23 2018 07:14:31 EDT Ventricular Rate:  84 PR Interval:    QRS  Duration: 122 QT Interval:  399 QTC Calculation: 472 R Axis:   90 Text Interpretation:  Sinus rhythm Biatrial enlargement Nonspecific intraventricular conduction delay Baseline wander in lead(s) V3 peaked t waves Otherwise no significant change Confirmed by Deno Etienne [phone]) on 10/23/2018 9:10:01 AM   Radiology No results found.  Procedures Procedures (including critical care time) Procedure note: Ultrasound Guided Peripheral IV Ultrasound guided peripheral 1.88 inch angiocath IV placement performed by me. Indications: Nursing unable to place IV. Details: The antecubital fossa and upper arm were evaluated with a multifrequency linear probe. Patent brachial veins were noted. 1 attempt was made to cannulate a vein under realtime US guidance with successful cannulation of the vein and catheter placement. There is return of non-pulsatile dark red blood. The patient tolerated the procedure well without complications. Images archived electronically.  CPT codes: 902 [id number] 0120 and [id number]  Medications Ordered in ED Medications  amLODipine (NORVASC) tablet 5 mg (5 mg Oral Given 10/23/18 1111)  metoprolol succinate (TOPROL-XL) 24 hr tablet 50 mg (50 mg Oral Given 10/23/18 1111)  cinacalcet (SENSIPAR) tablet 60 mg (60 mg Oral Given 10/23/18 1111)  calcium acetate (PHOSLO) capsule 2,001 mg (2,001 mg Oral Given 10/23/18 1111)  insulin regular, human (MYXREDLIN) 100 units/ 100 mL infusion (10.8 Units/hr Intravenous Rate/Dose Change 10/23/18 1205)  acetaminophen (TYLENOL) tablet 650 mg (has no administration in time range)    Or  acetaminophen (TYLENOL) suppository 650 mg (has no administration in time range)  zolpidem (AMBIEN) tablet 5 mg (has no administration in time range)  sorbitol 70 % solution 30 mL (has no administration in time range)  docusate sodium (ENEMEEZ) enema 283 mg (has no administration in time range)  ondansetron (ZOFRAN) tablet 4 mg (has no administration in time range)    Or  ondansetron (ZOFRAN)  injection 4 mg (has no administration in time range)  camphor-menthol (SARNA) lotion 1 application (has no administration in time range)    And  hydrOXYzine (ATARAX/VISTARIL) tablet 25 mg (has no administration in time range)  calcium carbonate (dosed in mg elemental calcium) suspension 500 mg of elemental calcium (has no administration in time range)  feeding supplement (NEPRO CARB STEADY) liquid 237 mL (has no administration in time range)  heparin injection 5,000 Units (5,000 Units Subcutaneous Not Given 10/23/18 1307)  dextrose 5 % solution ( Intravenous Hold 10/23/18 1131)  metoCLOPramide (REGLAN) injection 10 mg (10 mg Intravenous Given 10/23/18 0758)  diphenhydrAMINE (BENADRYL) injection 25 mg (25 mg Intravenous Given 10/23/18 0758)  sodium chloride 0.9 % bolus 250 mL (0 mLs Intravenous Stopped 10/23/18 0839)     Initial Impression / Assessment and Plan / ED Course  I have reviewed the triage vital signs and the nursing notes.  Pertinent labs & imaging results that were available during my care of the patient were reviewed by me and considered in my medical decision making (see chart for details).        28 yo F with a chief complaint of nausea and vomiting.  Patient is a type I diabetic states she has been controlling her blood sugar without issues until this morning it was noted to be somewhat high.  Will obtain lab work to evaluate for DKA will give a small bolus of fluids Reglan  Benadryl and reassess.  Patient's lab work is concerning for diabetic ketoacidosis versus HHS with a blood sugar greater than 1100 and an anion gap of 21.  She has a very mild acidosis.  Will start on insulin drip.  Will discuss with hospitalist for admission.  CRITICAL CARE Performed by: Cecilio Asper   Total critical care time: 35 minutes  Critical care time was exclusive of separately billable procedures and treating other patients.  Critical care was necessary to treat or prevent imminent or  life-threatening deterioration.  Critical care was time spent personally by me on the following activities: development of treatment plan with patient and/or surrogate as well as nursing, discussions with consultants, evaluation of patient's response to treatment, examination of patient, obtaining history from patient or surrogate, ordering and performing treatments and interventions, ordering and review of laboratory studies, ordering and review of radiographic studies, pulse oximetry and re-evaluation of patient's condition. The patients results and plan were reviewed and discussed.   Any x-rays performed were independently reviewed by myself.   Differential diagnosis were considered with the presenting HPI.  Medications  amLODipine (NORVASC) tablet 5 mg (5 mg Oral Given 10/23/18 1111)  metoprolol succinate (TOPROL-XL) 24 hr tablet 50 mg (50 mg Oral Given 10/23/18 1111)  cinacalcet (SENSIPAR) tablet 60 mg (60 mg Oral Given 10/23/18 1111)  calcium acetate (PHOSLO) capsule 2,001 mg (2,001 mg Oral Given 10/23/18 1111)  insulin regular, human (MYXREDLIN) 100 units/ 100 mL infusion (10.8 Units/hr Intravenous Rate/Dose Change 10/23/18 1205)  acetaminophen (TYLENOL) tablet 650 mg (has no administration in time range)    Or  acetaminophen (TYLENOL) suppository 650 mg (has no administration in time range)  zolpidem (AMBIEN) tablet 5 mg (has no administration in time range)  sorbitol 70 % solution 30 mL (has no administration in time range)  docusate sodium (ENEMEEZ) enema 283 mg (has no administration in time range)  ondansetron (ZOFRAN) tablet 4 mg (has no administration in time range)    Or  ondansetron (ZOFRAN) injection 4 mg (has no administration in time range)  camphor-menthol (SARNA) lotion 1 application (has no administration in time range)    And  hydrOXYzine (ATARAX/VISTARIL) tablet 25 mg (has no administration in time range)  calcium carbonate (dosed in mg elemental calcium) suspension 500 mg of  elemental calcium (has no administration in time range)  feeding supplement (NEPRO CARB STEADY) liquid 237 mL (has no administration in time range)  heparin injection 5,000 Units (5,000 Units Subcutaneous Not Given 10/23/18 1307)  dextrose 5 % solution ( Intravenous Hold 10/23/18 1131)  metoCLOPramide (REGLAN) injection 10 mg (10 mg Intravenous Given 10/23/18 0758)  diphenhydrAMINE (BENADRYL) injection 25 mg (25 mg Intravenous Given 10/23/18 0758)  sodium chloride 0.9 % bolus 250 mL (0 mLs Intravenous Stopped 10/23/18 0839)    Vitals:   10/23/18 0930 10/23/18 1000 10/23/18 1053 10/23/18 1204  BP: (!) 144/78 (!) 142/76  (!) 161/93  Pulse: 85 86  92  Resp: 18 17  16   Temp:   99.1 F (37.3 C) 99.3 F (37.4 C)  TempSrc:   Oral Oral  SpO2: 100% 99%    Weight:      Height:        Final diagnoses:  DKA, type 1, not at goal [facility])    Admission/ observation were discussed with the admitting physician, patient and/or family and they are comfortable with the plan.    Final Clinical Impressions(s) / ED Diagnoses   Final diagnoses:  DKA, type 1, not  at goal [facility])    ED Discharge Orders    None       Deno Etienne, [place] 10/23/18 1514

## 2018-10-23 NOTE — TOC Initial Note (Signed)
Transition of Care [facility]) - Initial/Assessment Note    Patient Details  Name: [name] MRN: [id number] Date of Birth: 1991-01-24  Transition of Care [facility]) CM/SW Contact:    Fuller Mandril, RN Phone Number: 10/23/2018, 9:00 AM  Clinical Narrative:                 [facility] following for disposition needs.  Expected Discharge Plan: Home/Self Care Barriers to Discharge: Continued Medical Work up   Patient Goals and CMS Choice Patient states their goals for this hospitalization and ongoing recovery are:: stop vominting      Expected Discharge Plan and Services Expected Discharge Plan: Home/Self Care     HD patient, please notifed [facility] [phone]) of pt ED visit.  Pt gets HD on Tuesday, Thursday and Saturday at  [facility].  Last HD 10/21/2018.      Expected Discharge Date: (unknown)                                    Prior Living Arrangements/Services   Lives with:: Self Patient language and need for interpreter reviewed:: Yes Do you feel safe going back to the place where you live?: Yes               Activities of Daily Living      Permission Sought/Granted Permission sought to share information with : Case Manager, PCP Permission granted to share information with : Yes, Verbal Permission Granted              Emotional Assessment Appearance:: Well-Groomed Attitude/Demeanor/Rapport: Engaged Affect (typically observed): Appropriate Orientation: : Oriented to Self, Oriented to Place, Oriented to  Time, Oriented to Situation Alcohol / Substance Use: Never Used Psych Involvement: No (comment)  Admission diagnosis:  Vomitting; Dialysis Pt Patient Active Problem List   Diagnosis Date Noted  . Essential hypertension 08/15/2018  . Lobar pneumonia ([place]) 06/27/2018  . Dizziness 03/22/2018  . Multifocal pneumonia 05/14/2017  . Normocytic normochromic anemia 01/26/2017  . ESRD (end stage renal disease) ([place]) 01/26/2017  . Fluid  overload 01/26/2017  . Depression 07/04/2014  . Hemodialysis-associated hypotension 04/24/2014  . Cerebral infarction due to thrombosis of right middle cerebral artery ([place]) 04/24/2014  . History of anemia 04/17/2014  . Cardiac arrest ([place]) 04/14/2014  . Diabetic neuropathy ([place]) 02/14/2014  . DKA, type 1 ([place]) 02/12/2014  . Tachycardia 02/12/2014  . DOE (dyspnea on exertion) 02/12/2014  . Abdominal pain 02/12/2014  . IDDM (insulin dependent diabetes mellitus) ([place]) 02/12/2014  . 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 Hypokalemia 12/07/2013  . Noncompliance 12/06/2013  . DKA (diabetic ketoacidoses) ([place]) 12/05/2013  . Leukocytosis 12/05/2013  . Acute on chronic renal failure ([place]) 12/05/2013  . Hypercalcemia 12/05/2013  . Abnormal LFTs 12/05/2013  . Elevated lipase 12/05/2013   PCP:  Shelby Mattocks, PA-C Pharmacy:   [facility], [address] [place] [address] Phone: [phone] Fax: [phone]     Social Determinants of Health (SDOH) Interventions    Readmission Risk Interventions No flowsheet data found.

## 2018-10-23 NOTE — ED Triage Notes (Signed)
Pt arrives via EMS from home. Pt reports vomiting since Saturday, last full treatment HD Saturday. CBG reading high. HTN for EMS. Pt states she has been unable to keep medications down since Saturday.

## 2018-10-23 NOTE — Progress Notes (Signed)
Hypoglycemic Event  CBG: 42  Treatment: D50 50 mL (25 gm)  Symptoms: None  Follow-up CBG: Time:1835 CBG Result:89  Possible Reasons for Event: insulin drip dka  Comments/MD notified:yates    [name]

## 2018-10-24 ENCOUNTER — Ambulatory Visit: Payer: Medicaid Other | Admitting: Podiatry

## 2018-10-24 DIAGNOSIS — E1065 Type 1 diabetes mellitus with hyperglycemia: Secondary | ICD-10-CM | POA: Diagnosis not present

## 2018-10-24 LAB — BASIC METABOLIC PANEL
Anion gap: 23 — ABNORMAL HIGH (ref 5–15)
Anion gap: 10 (ref 5–15)
BUN: 57 mg/dL — ABNORMAL HIGH (ref 6–20)
BUN: 11 mg/dL (ref 6–20)
CO2: 19 mmol/L — ABNORMAL LOW (ref 22–32)
CO2: 29 mmol/L (ref 22–32)
Calcium: 8.3 mg/dL — ABNORMAL LOW (ref 8.9–10.3)
Calcium: 8.8 mg/dL — ABNORMAL LOW (ref 8.9–10.3)
Chloride: 88 mmol/L — ABNORMAL LOW (ref 98–111)
Chloride: 99 mmol/L (ref 98–111)
Creatinine, Ser: 12.73 mg/dL — ABNORMAL HIGH (ref 0.44–1.00)
Creatinine, Ser: 4.22 mg/dL — ABNORMAL HIGH (ref 0.44–1.00)
GFR calc Af Amer: 4 mL/min — ABNORMAL LOW (ref >60)
GFR calc Af Amer: 16 mL/min — ABNORMAL LOW (ref >60)
GFR calc non Af Amer: 4 mL/min — ABNORMAL LOW (ref >60)
GFR calc non Af Amer: 14 mL/min — ABNORMAL LOW (ref >60)
Glucose, Bld: 408 mg/dL — ABNORMAL HIGH (ref 70–99)
Glucose, Bld: 77 mg/dL (ref 70–99)
Potassium: 4.5 mmol/L (ref 3.5–5.1)
Potassium: 3.2 mmol/L — ABNORMAL LOW (ref 3.5–5.1)
Sodium: 130 mmol/L — ABNORMAL LOW (ref 135–145)
Sodium: 138 mmol/L (ref 135–145)

## 2018-10-24 LAB — CBC
HCT: 32.1 % — ABNORMAL LOW (ref 36.0–46.0)
Hemoglobin: 10.9 g/dL — ABNORMAL LOW (ref 12.0–15.0)
MCH: 28.8 pg (ref 26.0–34.0)
MCHC: 34 g/dL (ref 30.0–36.0)
MCV: 84.7 fL (ref 80.0–100.0)
Platelets: 191 10*3/uL (ref 150–400)
RBC: 3.79 MIL/uL — ABNORMAL LOW (ref 3.87–5.11)
RDW: 14.7 % (ref 11.5–15.5)
WBC: 5.7 10*3/uL (ref 4.0–10.5)
nRBC: 0 % (ref 0.0–0.2)

## 2018-10-24 LAB — GLUCOSE, CAPILLARY
Glucose-Capillary: 343 mg/dL — ABNORMAL HIGH (ref 70–99)
Glucose-Capillary: 339 mg/dL — ABNORMAL HIGH (ref 70–99)
Glucose-Capillary: 327 mg/dL — ABNORMAL HIGH (ref 70–99)
Glucose-Capillary: 132 mg/dL — ABNORMAL HIGH (ref 70–99)
Glucose-Capillary: 78 mg/dL (ref 70–99)

## 2018-10-24 LAB — HEMOGLOBIN A1C
Hgb A1c MFr Bld: 11.1 % — ABNORMAL HIGH (ref 4.8–5.6)
Mean Plasma Glucose: 272 mg/dL

## 2018-10-24 MED ORDER — DIPHENHYDRAMINE HCL 25 MG PO CAPS
ORAL_CAPSULE | ORAL | Status: AC
  Filled 2018-10-24: qty 1

## 2018-10-24 MED ORDER — LOPERAMIDE HCL 2 MG PO CAPS
2.0000 mg | ORAL_CAPSULE | ORAL | Status: DC | PRN
  Administered 2018-10-24: 12:00:00 2 mg via ORAL
  Filled 2018-10-24: qty 1

## 2018-10-24 MED ORDER — INSULIN GLARGINE 100 UNIT/ML ~~LOC~~ SOLN
20.0000 [IU] | Freq: Every day | SUBCUTANEOUS | Status: DC
  Administered 2018-10-24: 20 [IU] via SUBCUTANEOUS
  Filled 2018-10-24 (×2): qty 0.2

## 2018-10-24 MED ORDER — DIPHENHYDRAMINE HCL 25 MG PO CAPS
25.0000 mg | ORAL_CAPSULE | Freq: Three times a day (TID) | ORAL | Status: DC | PRN
  Administered 2018-10-24: 13:00:00 25 mg via ORAL

## 2018-10-24 MED ORDER — INSULIN ASPART 100 UNIT/ML ~~LOC~~ SOLN: 5.0000 [IU] | Freq: Three times a day (TID) | SUBCUTANEOUS | Status: DC

## 2018-10-24 MED ORDER — CHLORHEXIDINE GLUCONATE CLOTH 2 % EX PADS
6.0000 | MEDICATED_PAD | Freq: Every day | CUTANEOUS | Status: DC
  Administered 2018-10-24: 12:00:00 6 via TOPICAL

## 2018-10-24 NOTE — Progress Notes (Signed)
Second Q 2 hr CBG checked 339. Baltazar Najjar notified about results.

## 2018-10-24 NOTE — Progress Notes (Signed)
Patient back from dialysis in NAD.

## 2018-10-24 NOTE — Progress Notes (Signed)
CBG 343. D5% stopped earlier as ordered. Baltazar Najjar, NP notified

## 2018-10-24 NOTE — Progress Notes (Addendum)
Inpatient Diabetes Program Recommendations  AACE/ADA: New Consensus Statement on Inpatient Glycemic Control (2015)  Target Ranges:  Prepandial:   less than 140 mg/dL      Peak postprandial:   less than 180 mg/dL (1-2 hours)      Critically ill patients:  140 - 180 mg/dL   Lab Results  Component Value Date   GLUCAP 327 (H) 10/24/2018   HGBA1C 11.1 (H) 10/23/2018    Review of Glycemic Control  Diabetes history: DM 1 Sees Manley Mason, PA from [facility] medications: Lantus 15 units, Novolog 3 units tid with meals Current orders for Inpatient glycemic control:  Lantus 20 units Daily Novolog 0-9 units tid Novolog 0-5 units qhs  Inpatient Diabetes Program Recommendations:    Patient admitted with Glucose 1,136 mg/dl. Patient started on IV insulin. Patient had to have IV insulin bag replaced due to glucose not fluctuating in a 4 hour period. Patient has ESRD which contributed to her hypoglycemia while on IV insulin.   Patient had IV insulin gtt d/c'd without basal insulin administration and ultimately led to hyperglycemia in the 300-400 range. Lantus 20 units given this am.  May need to reduce Lantus dose to home dose of 15 units, will continue to monitor on Novolog Sensitive Correction.  A1c 11.1% this admission, uncontrolled at home.   Spoke with patient over the phone. Patient reports every time her K+ goes up her glucose spikes. Patient last saw her Endocrinologist in February and she reports her A1c being around a 9%. Patient did also report her CBGs are all over the place at home. Discussed potential glucose trends on HD days versus off days. Patient said she is new to HD coming up 2 years in August. Patient reports taking her insulin everyday and does not miss doses.  Patient reports her next appointment with her Endocrinologist in in June/July sometime. Spoke with patient about touching base with her Endo's office in case adjustments can be made and the fact that  her glucose went up so high. Discussed current A1c level. Patient checks her glucose 3-6 times a day.  Thanks,  Tama Headings RN, MSN, BC-ADM Inpatient Diabetes Coordinator Team Pager [phone] (8a-5p)

## 2018-10-24 NOTE — Progress Notes (Signed)
Patient off floor to dialysis

## 2018-10-24 NOTE — Discharge Summary (Signed)
Physician Discharge Summary  ELMER BOUTELLE ZOX:[id number] DOB: Jul 23, 1990 DOA: 10/23/2018  PCP: Shelby Mattocks, PA-C  Admit date: 10/23/2018 Discharge date: 10/24/2018  Admitted From: Home Disposition: Home  Recommendations for Outpatient Follow-up:  1. Follow up with PCP in 1-2 weeks 2. Please adjust insulin as appropriate 3. Please follow up on the following pending results: None  Home Health: None Equipment/Devices: None  Discharge Condition: Stable CODE STATUS: Full code  Hospital Course: 28 year old female with history of poorly controlled type 1 diabetes: ESRD on TTS HD, cardiac arrest and hypertension presenting with nausea & vomiting for 3 days and found to be in DKA.  Unclear why she went into DKA.  She has runny nose but does not feel she has URI.  In ED, hemodynamically stable.  Started on insulin drip and admitted.  Nephrology consulted.  COVID-19 negative.  Patient was transitioned to subcu insulin.  CBG fairly controlled after insulin titration.  She had dialysis and discharged home in stable condition.  Nausea and vomiting resolved.   See individual problem list below for more.  Discharge Diagnoses:  Principal Problem:   Hyperglycemia due to type 1 diabetes mellitus ([place]) Active Problems:   ESRD (end stage renal disease) ([place])   Essential hypertension  DKA/poorly controlled type I DM: A1c 11%.  DKA resolved.  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 -Had HD on schedule today  Hypertension: Normotensive -Discharged on home medications  Discharge Instructions  Discharge Instructions    Call MD for:  difficulty breathing, headache or visual disturbances   Complete by:  As directed    Call MD for:  extreme fatigue   Complete by:  As directed    Call MD for:  persistant dizziness or light-headedness   Complete by:  As directed    Call MD for:  persistant nausea and vomiting   Complete by:  As directed    Call MD for:  severe uncontrolled pain   Complete by:  As directed    Call MD for:  temperature >100.4   Complete by:  As directed    Diet - low sodium heart healthy   Complete by:  As directed    Diet Carb Modified   Complete by:  As directed    Discharge instructions   Complete by:  As directed    It has been a pleasure taking care of you! You were admitted due to diabetic ketoacidosis (elevated blood sugar).  You were treated and your glucose and symptoms improved.  We are discharging you on your home insulin.  We gave you a prescription for Zofran to take as needed for nausea or vomiting if it recurs.  Please monitor your blood sugar and keep log to take to your primary care doctor or endocrinologist at follow-up.  Please see your primary care doctor or endocrinologist in 1 to 2 weeks.  Once you are discharged, your primary care physician will handle any further medical issues. Please note that NO REFILLS for any discharge medications will be authorized once you are discharged, as it is imperative that you return to your primary care physician (or establish a relationship with a primary care physician if you do not have one) for your aftercare needs so that they can reassess your need for medications and monitor your lab values. Take care, Dr. Cyndia Skeeters   Increase activity slowly   Complete by:  As directed      Allergies as of 10/24/2018  No Known Allergies     Medication List    TAKE these medications   amLODipine 5 MG tablet Commonly known as:  NORVASC One bid What changed:    how much to take  how to take this  when to take this  additional instructions   calcium acetate 667 MG capsule Commonly known as:  PHOSLO Take 3 capsules (2,001 mg total) by mouth 3 (three) times daily with meals. What changed:  how much to take   Famotidine 20 MG Chew Chew 20 mg by mouth as needed for heartburn or indigestion.   insulin aspart 100 UNIT/ML injection Commonly known as:   novoLOG Inject 0-9 Units into the skin 3 (three) times daily with meals. What changed:  how much to take   insulin glargine 100 UNIT/ML injection Commonly known as:  LANTUS Inject 0.22 mLs (22 Units total) into the skin at bedtime. What changed:  how much to take   metoprolol succinate 50 MG 24 hr tablet Commonly known as:  Toprol XL Take 1 tablet (50 mg total) by mouth daily. Take with or immediately following a meal.   multivitamin Tabs tablet Take 1 tablet by mouth at bedtime.   norethindrone 0.35 MG tablet Commonly known as:  MICRONOR Take 1 tablet (0.35 mg total) by mouth daily.   ondansetron 4 MG disintegrating tablet Commonly known as:  Zofran ODT Take 1 tablet (4 mg total) by mouth every 8 (eight) hours as needed for nausea or vomiting.   Sensipar 60 MG tablet Generic drug:  cinacalcet Take 60 mg by mouth daily.      Follow-up Information    Rodriguez-Southworth, Sunday Spillers, [place]. Schedule an appointment as soon as possible for a visit in 1 week(s).   Specialties:  Internal Medicine, Emergency Medicine Contact information: [address] [place] [address] [phone]           Consultations:  Nephrology  Procedures/Studies:  2D Echo: None obtained this admission   No results found.   Subjective: No complaints this morning.  Nausea and vomiting resolved.  Requesting compliance visit medication anticoagulation.  She has a history of hidradenitis but does not feel he says, "patient denies fever or chills.  Denies abdominal pain.  Feels ready to go home.   Discharge Exam: Vitals:   10/24/18 1700 10/24/18 1711  BP: (!) 154/87 (!) 151/84  Pulse: 84 86  Resp:  16  Temp:  97.8 F (36.6 C)  SpO2:  98%    GENERAL: No acute distress.  Sitting on edge of the bed HEENT: MMM.  Vision and hearing grossly intact.  NECK: Supple.  No JVD.  LUNGS:  No IWOB. Good air movement bilaterally. HEART:  RRR. Heart sounds normal.  ABD: Bowel sounds  present. Soft. Non tender.  MSK/EXT:  Moves all extremities. No apparent deformity. No edema bilaterally. SKIN: no apparent skin lesion or wound NEURO: Awake, alert and oriented appropriately.  No gross deficit.  PSYCH: Calm. Normal affect.     The results of significant diagnostics from this hospitalization (including imaging, microbiology, ancillary and laboratory) are listed below for reference.     Microbiology: Recent Results (from the past 240 hour(s))  SARS Coronavirus 2 (CEPHEID - Performed in [place] hospital lab), Hosp Order     Status: None   Collection Time: 10/23/18  5:28 PM  Result Value Ref Range Status   SARS Coronavirus 2 NEGATIVE NEGATIVE Final    Comment: (NOTE) If result is NEGATIVE SARS-CoV-2 target  nucleic acids are NOT DETECTED. The SARS-CoV-2 RNA is generally detectable in upper and lower  respiratory specimens during the acute phase of infection. The lowest  concentration of SARS-CoV-2 viral copies this assay can detect is 250  copies / mL. A negative result does not preclude SARS-CoV-2 infection  and should not be used as the sole basis for treatment or other  patient management decisions.  A negative result may occur with  improper specimen collection / handling, submission of specimen other  than nasopharyngeal swab, presence of viral mutation(s) within the  areas targeted by this assay, and inadequate number of viral copies  (<250 copies / mL). A negative result must be combined with clinical  observations, patient history, and epidemiological information. If result is POSITIVE SARS-CoV-2 target nucleic acids are DETECTED. The SARS-CoV-2 RNA is generally detectable in upper and lower  respiratory specimens dur ing the acute phase of infection.  Positive  results are indicative of active infection with SARS-CoV-2.  Clinical  correlation with patient history and other diagnostic information is  necessary to determine patient infection status.   Positive results do  not rule out bacterial infection or co-infection with other viruses. If result is PRESUMPTIVE POSTIVE SARS-CoV-2 nucleic acids MAY BE PRESENT.   A presumptive positive result was obtained on the submitted specimen  and confirmed on repeat testing.  While 2019 novel coronavirus  (SARS-CoV-2) nucleic acids may be present in the submitted sample  additional confirmatory testing may be necessary for epidemiological  and / or clinical management purposes  to differentiate between  SARS-CoV-2 and other Sarbecovirus currently known to infect humans.  If clinically indicated additional testing with an alternate test  methodology [phone]) is advised. The SARS-CoV-2 RNA is generally  detectable in upper and lower respiratory sp ecimens during the acute  phase of infection. The expected result is Negative. Fact Sheet for Patients:  StrictlyIdeas.no Fact Sheet for Healthcare Providers: BankingDealers.co.za This test is not yet approved or cleared by the [place] FDA and has been authorized for detection and/or diagnosis of SARS-CoV-2 by FDA under an Emergency Use Authorization (EUA).  This EUA will remain in effect (meaning this test can be used) for the duration of the COVID-19 declaration under Section 564(b)(1) of the Act, 21 U.S.C. section 360bbb-3(b)(1), unless the authorization is terminated or revoked sooner. Performed at [facility], [place] [address], [place], [place] [address]      Labs: BNP (last 3 results) No results for input(s): BNP in the last 8760 hours. Basic Metabolic Panel: Recent Labs  Lab 10/23/18 0732 10/23/18 0735 10/23/18 1031 10/23/18 1459 10/24/18 0621  NA 120* 118* 123* 132* 130*  K 5.6* 5.6* 5.2* 4.0 4.5  CL 80*  --  82* 93* 88*  CO2 19*  --  22 22 19*  GLUCOSE 1,136*  --  1,098* 447* 408*  BUN 49*  --  52* 52* 57*  CREATININE 9.95*  --  10.76* 11.11* 12.73*  CALCIUM 8.0*  --   7.8* 8.4* 8.3*   Liver Function Tests: Recent Labs  Lab 10/23/18 0732  AST 26  ALT 30  ALKPHOS 149*  BILITOT 1.0  PROT 7.7  ALBUMIN 4.4   Recent Labs  Lab 10/23/18 0732  LIPASE 31   No results for input(s): AMMONIA in the last 168 hours. CBC: Recent Labs  Lab 10/23/18 0732 10/23/18 0735  WBC 8.5  --   NEUTROABS 7.5  --   HGB 10.4* 12.9  HCT 35.0* 38.0  MCV 95.1  --   PLT 188  --    Cardiac Enzymes: No results for input(s): CKTOTAL, CKMB, CKMBINDEX, TROPONINI in the last 168 hours. BNP: Invalid input(s): POCBNP CBG: Recent Labs  Lab 10/23/18 2346 10/24/18 0100 10/24/18 0259 10/24/18 0810 10/24/18 1204  GLUCAP 284* 343* 339* 327* 132*   D-Dimer No results for input(s): DDIMER in the last 72 hours. Hgb A1c Recent Labs    10/23/18 1725  HGBA1C 11.1*   Lipid Profile No results for input(s): CHOL, HDL, LDLCALC, TRIG, CHOLHDL, LDLDIRECT in the last 72 hours. Thyroid function studies No results for input(s): TSH, T4TOTAL, T3FREE, THYROIDAB in the last 72 hours.  Invalid input(s): FREET3 Anemia work up No results for input(s): VITAMINB12, FOLATE, FERRITIN, TIBC, IRON, RETICCTPCT in the last 72 hours. Urinalysis    Component Value Date/Time   COLORURINE YELLOW 03/21/2018 2109   APPEARANCEUR TURBID (A) 03/21/2018 2109   LABSPEC 1.014 03/21/2018 2109   PHURINE 8.0 03/21/2018 2109   GLUCOSEU >=500 (A) 03/21/2018 2109   HGBUR MODERATE (A) 03/21/2018 2109   BILIRUBINUR NEGATIVE 03/21/2018 2109   [place] NEGATIVE 03/21/2018 2109   PROTEINUR >=300 (A) 03/21/2018 2109   UROBILINOGEN 0.2 04/07/2014 1457   NITRITE NEGATIVE 03/21/2018 2109   LEUKOCYTESUR LARGE (A) 03/21/2018 2109   Sepsis Labs Invalid input(s): PROCALCITONIN,  WBC,  LACTICIDVEN   Time coordinating discharge: 25 minutes  SIGNED:  Mercy Riding, MD  [facility] 10/24/2018, 7:06 PM Pager [phone]  If 7PM-7AM, please contact night-coverage [email or web address] Password [email or web address]

## 2018-10-24 NOTE — Progress Notes (Signed)
Discharge teaching complete. Meds, diet, activity, follow up appointments reviewed and all questions answered. Copy of instructions given to patient.  

## 2018-10-24 NOTE — Consult Note (Addendum)
[facility] Renal Consultation Note    Indication for Consultation:  Management of ESRD/hemodialysis; anemia, hypertension/volume and secondary hyperparathyroidism PCP: Sunday Spillers Rodriguez-Southworth PAC  HPI: [name] is a 28 y.o. female with ESRD on hemodialysis T,Th,S at [facility]. PMH significant for DMT1, DKA, HTN, CVA, AOCD, SHPT, cardiac arrest.  Patient presented to ED with C/O nausea and vomiting. Upon arrival to ED, she was noted to be in DKA, BS 1136 AG 21 on admission. She has been admitted as observation patient for DKA. She has been started DKA protocol, BS coming down. COVID 19 negative. Currently she is being transported to HD. Says she feels better.   Past Medical History:  Diagnosis Date  . Cardiac arrest ([place])   . Complication of anesthesia   . Diabetes mellitus type 1 ([place])    2005 dx [facility]  . DKA (diabetic ketoacidoses) ([place])   . ESRD (end stage renal disease) ([facility])    TTS HD, dialyzed at [facility]  . Hypertension   . PONV (postoperative nausea and vomiting)   . Stroke [facility])    Past Surgical History:  Procedure Laterality Date  . CATARACT EXTRACTION     6 years ago  . DIALYSIS FISTULA CREATION    . IR FLUORO GUIDE CV LINE RIGHT  01/26/2017  . IR REMOVAL TUN CV CATH W/O FL  05/16/2017  . IR US GUIDE VASC ACCESS RIGHT  01/26/2017   Family History  Problem Relation Age of Onset  . Diabetes Maternal Grandmother   . Stroke Maternal Grandmother   . Cancer Maternal Aunt        aunt, passed last month, ?RCC  . Cancer Maternal Uncle    Social History:  reports that she has never smoked. She has never used smokeless tobacco. She reports that she does not drink alcohol or use drugs. No Known Allergies Prior to Admission medications   Medication Sig Start Date End Date Taking? Authorizing Provider  amLODipine (NORVASC) 5 MG tablet One bid Patient taking differently: Take 5 mg by mouth 2 (two) times daily.   07/13/18  Yes Rodriguez-Southworth, Sunday Spillers, PA-C  calcium acetate (PHOSLO) 667 MG capsule Take 3 capsules (2,001 mg total) by mouth 3 (three) times daily with meals. Patient taking differently: Take 1,334 mg by mouth 3 (three) times daily with meals.  05/16/17  Yes Rai, Ripudeep K, MD  Famotidine 20 MG CHEW Chew 20 mg by mouth as needed for heartburn or indigestion.   Yes [provider]  insulin aspart (NOVOLOG) 100 UNIT/ML injection Inject 0-9 Units into the skin 3 (three) times daily with meals. Patient taking differently: Inject 3 Units into the skin 3 (three) times daily with meals.  01/29/17  Yes Georgette Shell, MD  insulin glargine (LANTUS) 100 UNIT/ML injection Inject 0.22 mLs (22 Units total) into the skin at bedtime. Patient taking differently: Inject 15 Units into the skin at bedtime.  03/22/18  Yes Ghimire, Henreitta Leber, MD  metoprolol succinate (TOPROL XL) 50 MG 24 hr tablet Take 1 tablet (50 mg total) by mouth daily. Take with or immediately following a meal. 07/24/18 07/24/19 Yes Rodriguez-Southworth, Sunday Spillers, PA-C  multivitamin (RENA-VIT) TABS tablet Take 1 tablet by mouth at bedtime. 01/29/17  Yes Georgette Shell, MD  norethindrone (MICRONOR,CAMILA,ERRIN) 0.35 MG tablet Take 1 tablet (0.35 mg total) by mouth daily. 01/29/17  Yes Georgette Shell, MD  ondansetron (ZOFRAN ODT) 4 MG disintegrating tablet Take 1 tablet (4 mg total) by mouth  every 8 (eight) hours as needed for nausea or vomiting. 07/19/18  Yes Antonietta Breach, PA-C  SENSIPAR 60 MG tablet Take 60 mg by mouth daily. 03/16/18  Yes [provider]   Current Facility-Administered Medications  Medication Dose Route Frequency Provider Last Rate Last Dose  . acetaminophen (TYLENOL) tablet 650 mg  650 mg Oral Q6H PRN Karmen Bongo, MD   650 mg at 10/24/18 6834   Or  . acetaminophen (TYLENOL) suppository 650 mg  650 mg Rectal Q6H PRN Karmen Bongo, MD      . amLODipine (NORVASC) tablet 5 mg  5 mg Oral BID  Karmen Bongo, MD   5 mg at 10/24/18 0856  . calcium acetate (PHOSLO) capsule 2,001 mg  2,001 mg Oral TID WC Karmen Bongo, MD   2,001 mg at 10/24/18 0856  . calcium carbonate (dosed in mg elemental calcium) suspension 500 mg of elemental calcium  500 mg of elemental calcium Oral Q6H PRN Karmen Bongo, MD      . camphor-menthol [facility]) lotion 1 application  1 application Topical H9Q PRN Karmen Bongo, MD       And  . hydrOXYzine (ATARAX/VISTARIL) tablet 25 mg  25 mg Oral Q8H PRN Karmen Bongo, MD      . Chlorhexidine Gluconate Cloth 2 % PADS 6 each  6 each Topical Q0600 Valentina Gu, NP      . cinacalcet [facility]) tablet 60 mg  60 mg Oral Q breakfast Karmen Bongo, MD   60 mg at 10/24/18 0856  . docusate sodium (ENEMEEZ) enema 283 mg  1 enema Rectal PRN Karmen Bongo, MD      . feeding supplement (NEPRO CARB STEADY) liquid 237 mL  237 mL Oral TID PRN Karmen Bongo, MD      . heparin injection 5,000 Units  5,000 Units Subcutaneous Camelia Phenes Karmen Bongo, MD   5,000 Units at 10/24/18 0530  . insulin aspart (novoLOG) injection 0-5 Units  0-5 Units Subcutaneous QHS Karmen Bongo, MD      . insulin aspart (novoLOG) injection 0-9 Units  0-9 Units Subcutaneous TID WC Karmen Bongo, MD   7 Units at 10/24/18 [phone]  . insulin aspart (novoLOG) injection 5 Units  5 Units Subcutaneous TID WC Gonfa, Taye T, MD      . insulin glargine (LANTUS) injection 20 Units  20 Units Subcutaneous Daily Kirby-Graham, Karsten Fells, NP   20 Units at 10/24/18 0529  . loperamide (IMODIUM) capsule 2 mg  2 mg Oral PRN Wendee Beavers T, MD      . metoprolol succinate (TOPROL-XL) 24 hr tablet 50 mg  50 mg Oral Daily Karmen Bongo, MD   50 mg at 10/24/18 0856  . ondansetron (ZOFRAN) tablet 4 mg  4 mg Oral Q6H PRN Karmen Bongo, MD       Or  . ondansetron [facility]) injection 4 mg  4 mg Intravenous Q6H PRN Karmen Bongo, MD      . promethazine (PHENERGAN) injection 25 mg  25 mg Intravenous Q6H PRN Karmen Bongo, MD   25 mg at 10/23/18 1753  . sorbitol 70 % solution 30 mL  30 mL Oral PRN Karmen Bongo, MD      . zolpidem [facility]) tablet 5 mg  5 mg Oral QHS PRN Karmen Bongo, MD       Labs: Basic Metabolic Panel: Recent Labs  Lab 10/23/18 1031 10/23/18 1459 10/24/18 0621  NA 123* 132* 130*  K 5.2* 4.0 4.5  CL 82* 93* 88*  CO2  22 22 19*  GLUCOSE 1,098* 447* 408*  BUN 52* 52* 57*  CREATININE 10.76* 11.11* 12.73*  CALCIUM 7.8* 8.4* 8.3*   Liver Function Tests: Recent Labs  Lab 10/23/18 0732  AST 26  ALT 30  ALKPHOS 149*  BILITOT 1.0  PROT 7.7  ALBUMIN 4.4   Recent Labs  Lab 10/23/18 0732  LIPASE 31   No results for input(s): AMMONIA in the last 168 hours. CBC: Recent Labs  Lab 10/23/18 0732 10/23/18 0735  WBC 8.5  --   NEUTROABS 7.5  --   HGB 10.4* 12.9  HCT 35.0* 38.0  MCV 95.1  --   PLT 188  --    Cardiac Enzymes: No results for input(s): CKTOTAL, CKMB, CKMBINDEX, TROPONINI in the last 168 hours. CBG: Recent Labs  Lab 10/23/18 2346 10/24/18 0100 10/24/18 0259 10/24/18 0810 10/24/18 1204  GLUCAP 284* 343* 339* 327* 132*   Iron Studies: No results for input(s): IRON, TIBC, TRANSFERRIN, FERRITIN in the last 72 hours. Studies/Results: No results found.  ROS: As per HPI otherwise negative.   Physical Exam: Vitals:   10/24/18 0000 10/24/18 0055 10/24/18 0514 10/24/18 0515  BP:   138/85   Pulse:   88   Resp:   16   Temp: 99.3 F (37.4 C) 98.8 F (37.1 C)  98.7 F (37.1 C)  TempSrc: Oral Oral  Oral  SpO2:   95%   Weight:      Height:         General: Well developed, well nourished, in no acute distress. Head: Normocephalic, atraumatic, sclera non-icteric, mucus membranes are moist Neck: Supple. JVD not elevated. Lungs: Clear bilaterally to auscultation without wheezes, rales, or rhonchi. Breathing is unlabored. Heart: RRR with S1 S2. No murmurs, rubs, or gallops appreciated. Abdomen: Soft, non-tender, non-distended with normoactive  bowel sounds. No rebound/guarding. No obvious abdominal masses. M-S:  Strength and tone appear normal for age. Lower extremities:without edema or ischemic changes, no open wounds  Neuro: Alert and oriented X 3. Moves all extremities spontaneously. Psych:  Responds to questions appropriately with a normal affect. Dialysis Access: L AVF + bruit  Dialysis Orders: [place] T, Th,S 4 hrs 180NRe 400/800 57.5 kg 2.0 K/2.25 Ca (Note: Patient has been using hypoallergenic dialyzer at HD center for nausea. Hypoallergenic dialyzer not available in hospital. Double rinse dialyzer prior to HD).  L AVF -Heparin 1000 units IV TIW -Aranesp 100 mcg IV weekly (last dose 10/19/18 Last HGB 9.5 10/19/18) -Hectorol 1 mcg IV TIW (Last PTH 309 09/21/18) BMD meds: Calcium acetate 667 mg 2 caps PO TID AC 1-2 caps with snack, Sensipar 60 mg PO q day.    Assessment/Plan: 1.  Hyperglycemia in setting of DMT1-per primary 2.  N & V-per primary 3.  ESRD -  T,Th,S HD today on schedule. K+ 4.5 2.0 K bath, tight heparin.  4.  Hypertension/volume  -Well controlled. UFG to OP EDW 57.5 5.  Anemia  - HGB 10.4. ESA due 10/26/18 6.  Metabolic bone disease - Continue binders/sensipar/VDRA. Ca 8.3 7.  Nutrition -Renal Carb Mod diet. Nepro. Renal vits.  Rita H. Owens Shark, NP-C 10/24/2018, 12:16 PM  [employer] [phone]  I have seen and examined this patient and agree with the plan of care. Feeling better already. Seen on HD 2/2.5 bath 145/90 UF 1.5L as tolerated  Emmalea Treanor Hackmann, Hunt Oris, MD 10/24/2018, 1:41 PM

## 2018-10-25 ENCOUNTER — Other Ambulatory Visit: Payer: Self-pay

## 2018-10-25 ENCOUNTER — Ambulatory Visit: Payer: Medicaid Other | Admitting: Nurse Practitioner

## 2018-10-25 ENCOUNTER — Ambulatory Visit: Payer: Medicaid Other | Admitting: Internal Medicine

## 2018-10-25 ENCOUNTER — Encounter: Payer: Self-pay | Admitting: Nurse Practitioner

## 2018-10-25 VITALS — BP 160/90 | HR 88 | Temp 98.9°F | Ht 64.2 in | Wt 131.8 lb

## 2018-10-25 DIAGNOSIS — E875 Hyperkalemia: Secondary | ICD-10-CM

## 2018-10-25 DIAGNOSIS — E1022 Type 1 diabetes mellitus with diabetic chronic kidney disease: Secondary | ICD-10-CM

## 2018-10-25 DIAGNOSIS — E1010 Type 1 diabetes mellitus with ketoacidosis without coma: Secondary | ICD-10-CM

## 2018-10-25 DIAGNOSIS — I10 Essential (primary) hypertension: Secondary | ICD-10-CM

## 2018-10-25 DIAGNOSIS — I120 Hypertensive chronic kidney disease with stage 5 chronic kidney disease or end stage renal disease: Secondary | ICD-10-CM

## 2018-10-25 DIAGNOSIS — Z09 Encounter for follow-up examination after completed treatment for conditions other than malignant neoplasm: Secondary | ICD-10-CM

## 2018-10-25 DIAGNOSIS — Z992 Dependence on renal dialysis: Secondary | ICD-10-CM

## 2018-10-25 DIAGNOSIS — N186 End stage renal disease: Secondary | ICD-10-CM

## 2018-10-25 LAB — HEPATITIS B SURFACE ANTIGEN: Hepatitis B Surface Ag: NEGATIVE

## 2018-10-25 NOTE — Progress Notes (Addendum)
Subjective:     Patient ID: [name] , female    DOB: 12/08/1990 , 28 y.o.   MRN: [id number]   Chief Complaint  Patient presents with  . Hypertension    HPI  She was admitted to [facility] on 5/4 - 5/5 for DKA then her potassium had increased.  She is on dialysis for 2 years - TThSat.  Has been a Type I diabetic since age 13.  She is being followed by Manley Mason MD at [facility].  She has not skipped her dialysis. She is on 20 units of Lantus at bedtime. Sliding scale Novolog 5 units each meal.  Next appt with Endocrinology in July and Nephrology in June.   HgbA1c now 11, and at dialysis was about 8.    Hypertension  This is a chronic (Reports in the morning she is usually 342'A systolic) problem. The current episode started more than 1 year ago. The problem is uncontrolled. Pertinent negatives include no anxiety. Risk factors for coronary artery disease include diabetes mellitus. Past treatments include calcium channel blockers. There are no compliance problems.  Identifiable causes of hypertension include chronic renal disease. There is no history of a thyroid problem.  Diabetes      Past Medical History:  Diagnosis Date  . Cardiac arrest ([place])   . Complication of anesthesia   . Diabetes mellitus type 1 ([place])    2005 [facility]  . DKA (diabetic ketoacidoses) ([place])   . ESRD (end stage renal disease) (HCC)    TTS HD, dialyzed at [facility]  . Hypertension   . PONV (postoperative nausea and vomiting)   . Stroke [facility])      Family History  Problem Relation Age of Onset  . Diabetes Maternal Grandmother   . Stroke Maternal Grandmother   . Cancer Maternal Aunt        aunt, passed last month, ?RCC  . Cancer Maternal Uncle      Current Outpatient Medications:  .  amLODipine (NORVASC) 5 MG tablet, One bid (Patient taking differently: Take 5 mg by mouth 2 (two) times daily. ), Disp: 60 tablet, Rfl: 0 .  calcium acetate (PHOSLO) 667 MG capsule, Take 3  capsules (2,001 mg total) by mouth 3 (three) times daily with meals. (Patient taking differently: Take 1,334 mg by mouth 3 (three) times daily with meals. ), Disp: 180 capsule, Rfl: 2 .  Famotidine 20 MG CHEW, Chew 20 mg by mouth as needed for heartburn or indigestion., Disp: , Rfl:  .  insulin aspart (NOVOLOG) 100 UNIT/ML injection, Inject 0-9 Units into the skin 3 (three) times daily with meals. (Patient taking differently: Inject 3 Units into the skin 3 (three) times daily with meals. ), Disp: 10 mL, Rfl: 11 .  insulin glargine (LANTUS) 100 UNIT/ML injection, Inject 0.22 mLs (22 Units total) into the skin at bedtime. (Patient taking differently: Inject 15 Units into the skin at bedtime. ), Disp: 10 mL, Rfl: 0 .  metoprolol succinate (TOPROL XL) 50 MG 24 hr tablet, Take 1 tablet (50 mg total) by mouth daily. Take with or immediately following a meal., Disp: 90 tablet, Rfl: 0 .  multivitamin (RENA-VIT) TABS tablet, Take 1 tablet by mouth at bedtime., Disp: 30 tablet, Rfl: 0 .  norethindrone (MICRONOR,CAMILA,ERRIN) 0.35 MG tablet, Take 1 tablet (0.35 mg total) by mouth daily., Disp: 1 Package, Rfl: 11 .  ondansetron (ZOFRAN ODT) 4 MG disintegrating tablet, Take 1 tablet (4 mg total) by mouth every  8 (eight) hours as needed for nausea or vomiting., Disp: 10 tablet, Rfl: 0 .  SENSIPAR 60 MG tablet, Take 60 mg by mouth daily., Disp: , Rfl: 5   No Known Allergies   Review of Systems   Today's Vitals   10/25/18 0850  BP: (!) 160/90  Pulse: 88  Temp: 98.9 F (37.2 C)  TempSrc: Oral  Weight: 131 lb 12.8 oz (59.8 kg)  Height: 5' 4.2" (1.631 m)  PainSc: 0-No pain   Body mass index is 22.48 kg/m.   Objective:  Physical Exam Vitals signs reviewed.  Constitutional:      Appearance: Normal appearance.  Cardiovascular:     Rate and Rhythm: Normal rate and regular rhythm.     Pulses: Normal pulses.     Heart sounds: Normal heart sounds. No murmur.     Arteriovenous access: left arteriovenous  access is present.    Comments: Positive for thrill and bruit Skin:    Capillary Refill: Capillary refill takes less than 2 seconds.  Neurological:     General: No focal deficit present.     Mental Status: She is alert and oriented to person, place, and time.  Psychiatric:        Mood and Affect: Mood normal.        Behavior: Behavior normal.        Thought Content: Thought content normal.        Judgment: Judgment normal.         Assessment And Plan:     1. Essential hypertension . B/P is fairly controlled for patient she reports improves after her dialysis treatment.  . CMP ordered to check renal function.  . The importance of regular exercise and dietary modification was stressed to the patient.   2. 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 ESRD (end stage renal disease) ([place])  Continue with dialysis  4. Diabetic ketoacidosis without coma associated with type 1 diabetes mellitus ([place])  Admitted to hospital for 3 days after her blood sugar increased significantly  She is not requiring any after care at home  TCM not done as I found out patient was admitted to the hospital during her routine office visit and has been followed by Audery Amel who is out of the office at this time  5. Hyperkalemia  She had dialysis while in the hospital    Minette Brine, FNP    THE PATIENT IS ENCOURAGED TO PRACTICE SOCIAL DISTANCING DUE TO THE COVID-19 PANDEMIC.

## 2018-10-29 ENCOUNTER — Emergency Department (HOSPITAL_COMMUNITY)
Admission: EM | Admit: 2018-10-29 | Discharge: 2018-10-29 | Disposition: A | Discharge status: Home / Self Care | Payer: Medicaid Other | Attending: Emergency Medicine | Admitting: Emergency Medicine

## 2018-10-29 ENCOUNTER — Other Ambulatory Visit: Payer: Self-pay

## 2018-10-29 ENCOUNTER — Emergency Department (HOSPITAL_COMMUNITY): Payer: Medicaid Other

## 2018-10-29 ENCOUNTER — Encounter (HOSPITAL_COMMUNITY): Payer: Self-pay | Admitting: Student

## 2018-10-29 DIAGNOSIS — Z8674 Personal history of sudden cardiac arrest: Secondary | ICD-10-CM | POA: Diagnosis not present

## 2018-10-29 DIAGNOSIS — I120 Hypertensive chronic kidney disease with stage 5 chronic kidney disease or end stage renal disease: Secondary | ICD-10-CM | POA: Insufficient documentation

## 2018-10-29 DIAGNOSIS — E1022 Type 1 diabetes mellitus with diabetic chronic kidney disease: Secondary | ICD-10-CM | POA: Insufficient documentation

## 2018-10-29 DIAGNOSIS — Z8673 Personal history of transient ischemic attack (TIA), and cerebral infarction without residual deficits: Secondary | ICD-10-CM | POA: Insufficient documentation

## 2018-10-29 DIAGNOSIS — N186 End stage renal disease: Secondary | ICD-10-CM | POA: Diagnosis not present

## 2018-10-29 DIAGNOSIS — R42 Dizziness and giddiness: Secondary | ICD-10-CM | POA: Diagnosis present

## 2018-10-29 DIAGNOSIS — Z79899 Other long term (current) drug therapy: Secondary | ICD-10-CM | POA: Insufficient documentation

## 2018-10-29 DIAGNOSIS — Z992 Dependence on renal dialysis: Secondary | ICD-10-CM | POA: Insufficient documentation

## 2018-10-29 DIAGNOSIS — Z794 Long term (current) use of insulin: Secondary | ICD-10-CM | POA: Diagnosis not present

## 2018-10-29 LAB — COMPREHENSIVE METABOLIC PANEL
ALT: 20 U/L (ref 0–44)
AST: 17 U/L (ref 15–41)
Albumin: 4.1 g/dL (ref 3.5–5.0)
Alkaline Phosphatase: 88 U/L (ref 38–126)
Anion gap: 19 — ABNORMAL HIGH (ref 5–15)
BUN: 44 mg/dL — ABNORMAL HIGH (ref 6–20)
CO2: 23 mmol/L (ref 22–32)
Calcium: 8.5 mg/dL — ABNORMAL LOW (ref 8.9–10.3)
Chloride: 90 mmol/L — ABNORMAL LOW (ref 98–111)
Creatinine, Ser: 7.55 mg/dL — ABNORMAL HIGH (ref 0.44–1.00)
GFR calc Af Amer: 8 mL/min — ABNORMAL LOW (ref >60)
GFR calc non Af Amer: 7 mL/min — ABNORMAL LOW (ref >60)
Glucose, Bld: 338 mg/dL — ABNORMAL HIGH (ref 70–99)
Potassium: 4 mmol/L (ref 3.5–5.1)
Sodium: 132 mmol/L — ABNORMAL LOW (ref 135–145)
Total Bilirubin: 0.9 mg/dL (ref 0.3–1.2)
Total Protein: 7.6 g/dL (ref 6.5–8.1)

## 2018-10-29 LAB — I-STAT BETA HCG BLOOD, ED (MC, WL, AP ONLY): I-stat hCG, quantitative: <5 m[IU]/mL (ref <5)

## 2018-10-29 LAB — CBC WITH DIFFERENTIAL/PLATELET
Abs Immature Granulocytes: 0.01 10*3/uL (ref 0.00–0.07)
Basophils Absolute: 0 10*3/uL (ref 0.0–0.1)
Basophils Relative: 0 %
Eosinophils Absolute: 0.1 10*3/uL (ref 0.0–0.5)
Eosinophils Relative: 2 %
HCT: 32.4 % — ABNORMAL LOW (ref 36.0–46.0)
Hemoglobin: 10.5 g/dL — ABNORMAL LOW (ref 12.0–15.0)
Immature Granulocytes: 0 %
Lymphocytes Relative: 16 %
Lymphs Abs: 0.9 10*3/uL (ref 0.7–4.0)
MCH: 28.9 pg (ref 26.0–34.0)
MCHC: 32.4 g/dL (ref 30.0–36.0)
MCV: 89.3 fL (ref 80.0–100.0)
Monocytes Absolute: 0.5 10*3/uL (ref 0.1–1.0)
Monocytes Relative: 9 %
Neutro Abs: 4.1 10*3/uL (ref 1.7–7.7)
Neutrophils Relative %: 73 %
Platelets: 197 10*3/uL (ref 150–400)
RBC: 3.63 MIL/uL — ABNORMAL LOW (ref 3.87–5.11)
RDW: 14.7 % (ref 11.5–15.5)
WBC: 5.7 10*3/uL (ref 4.0–10.5)
nRBC: 0 % (ref 0.0–0.2)

## 2018-10-29 LAB — CBG MONITORING, ED
Glucose-Capillary: 268 mg/dL — ABNORMAL HIGH (ref 70–99)
Glucose-Capillary: 331 mg/dL — ABNORMAL HIGH (ref 70–99)
Glucose-Capillary: 342 mg/dL — ABNORMAL HIGH (ref 70–99)

## 2018-10-29 MED ORDER — INSULIN ASPART 100 UNIT/ML ~~LOC~~ SOLN
2.0000 [IU] | Freq: Once | SUBCUTANEOUS | Status: AC
  Administered 2018-10-29: 2 [IU] via SUBCUTANEOUS

## 2018-10-29 MED ORDER — PROCHLORPERAZINE EDISYLATE 10 MG/2ML IJ SOLN
10.0000 mg | Freq: Once | INTRAMUSCULAR | Status: AC
  Administered 2018-10-29: 10 mg via INTRAVENOUS
  Filled 2018-10-29: qty 2

## 2018-10-29 MED ORDER — SODIUM CHLORIDE 0.9 % IV BOLUS
500.0000 mL | Freq: Once | INTRAVENOUS | Status: AC
  Administered 2018-10-29: 500 mL via INTRAVENOUS

## 2018-10-29 NOTE — ED Notes (Signed)
Patient verbalizes understanding of discharge instructions. Opportunity for questioning and answers were provided. Armband removed by staff, pt discharged from ED. Pt wheeled to lobby for comfort. Pt taken home by family. Follow up care reviewed.

## 2018-10-29 NOTE — ED Provider Notes (Signed)
[facility] EMERGENCY DEPARTMENT Provider Note   CSN: [id number] Arrival date & time: 10/29/18  1231  History   Chief Complaint Chief Complaint  Patient presents with   Dizziness    HPI [name] is a 28 y.o. female with a hx of T1DM, ESRD on dialysis T/R/sat- fully dialyzed yesterday, HTN, prior middle cerebral artery stroke, and prior cardiac arrest who presents to the ED with complaints of headache & dizziness. Patient had recent admission to the hospital 05/04-05/05 for DKA w/ N/V- she states she had headache throughout her admission. Notes gradual onset steady progression pain to bilateral retro-orbital area that has been intermittent x 6 days. States pain lasts 4-5 hours at a time w/ intermittent resolution for 2-3 hours prior to return. She notes associated intermittent visual disturbances described as decreased peripheral vision, poor depth perception, and blurry vision as well as dizziness like the room is spinning which was initially intermittent, now dizziness constant over the past 48 hours. She states that sxs are worse when she turns her head too quickly. This AM the dizziness seemed worse, she has 1 episode of emesis, feels nauseous currently. Also mentions an episode during which she was sitting on the couch and started to have generalized body shaking, her vision went black, but she did not pass out and remembers all of the events. Believes this lasted a few minutes. She also mentions 2 episodes over the past 1 week where she felt her RLE was "dragging" while ambulating, denies any other numbness/weakness. Denies fever, chills, syncope, chest pain, dyspnea, hematemesis, abdominal pain, or diarrhea. States sugars have been in the 200s over past 24 hours. She is concerned she is having another stroke as this feel similar to her prior. States no hx of similar headache, however denies sudden onset or thunderclap sensation. She has not had her BP medication  this AM.      HPI  Past Medical History:  Diagnosis Date   Cardiac arrest ([place])    Complication of anesthesia    Diabetes mellitus type 1 ([place])    2005 [facility]   DKA (diabetic ketoacidoses) ([place])    ESRD (end stage renal disease) ([place])    TTS HD, dialyzed at [facility]   Hypertension    PONV (postoperative nausea and vomiting)    Stroke [facility])     Patient Active Problem List   Diagnosis Date Noted   Diabetic ketoacidosis without coma associated with type 1 diabetes mellitus ([place]) 10/25/2018   Hyperkalemia 10/25/2018   Hyperglycemia due to type 1 diabetes mellitus ([place]) 10/23/2018   Essential hypertension 08/15/2018   Lobar pneumonia ([place]) 06/27/2018   Dizziness 03/22/2018   Multifocal pneumonia 05/14/2017   Normocytic normochromic anemia 01/26/2017   ESRD (end stage renal disease) ([place]) 01/26/2017   Fluid overload 01/26/2017   Depression 07/04/2014   Hemodialysis-associated hypotension 04/24/2014   Cerebral infarction due to thrombosis of right middle cerebral artery ([place]) 04/24/2014   History of anemia 04/17/2014   Cardiac arrest ([place]) 04/14/2014   Diabetic neuropathy ([place]) 02/14/2014   DKA, type 1 ([place]) 02/12/2014   Tachycardia 02/12/2014   DOE (dyspnea on exertion) 02/12/2014   Abdominal pain 02/12/2014   IDDM (insulin dependent diabetes mellitus) ([place]) 02/12/2014   Type I diabetes mellitus with renal manifestations, uncontrolled ([place]) 12/08/2013   Hypokalemia 12/07/2013   Noncompliance 12/06/2013   DKA (diabetic ketoacidoses) ([place]) 12/05/2013   Leukocytosis 12/05/2013   Hypercalcemia 12/05/2013   Abnormal LFTs 12/05/2013  Elevated lipase 12/05/2013    Past Surgical History:  Procedure Laterality Date   CATARACT EXTRACTION     6 years ago   DIALYSIS FISTULA CREATION     IR FLUORO GUIDE CV LINE RIGHT  01/26/2017   IR REMOVAL TUN CV CATH W/O FL  05/16/2017   IR US GUIDE VASC ACCESS RIGHT  01/26/2017      OB History   No obstetric history on file.      Home Medications    Prior to Admission medications   Medication Sig Start Date End Date Taking? Authorizing Provider  amLODipine (NORVASC) 5 MG tablet One bid Patient taking differently: Take 5 mg by mouth 2 (two) times daily.  07/13/18   Rodriguez-Southworth, Sunday Spillers, PA-C  calcium acetate (PHOSLO) 667 MG capsule Take 3 capsules (2,001 mg total) by mouth 3 (three) times daily with meals. Patient taking differently: Take 1,334 mg by mouth 3 (three) times daily with meals.  05/16/17   Rai, Vernelle Emerald, MD  Famotidine 20 MG CHEW Chew 20 mg by mouth as needed for heartburn or indigestion.    [provider]  insulin aspart (NOVOLOG) 100 UNIT/ML injection Inject 0-9 Units into the skin 3 (three) times daily with meals. Patient taking differently: Inject 3 Units into the skin 3 (three) times daily with meals.  01/29/17   Georgette Shell, MD  insulin glargine (LANTUS) 100 UNIT/ML injection Inject 0.22 mLs (22 Units total) into the skin at bedtime. Patient taking differently: Inject 15 Units into the skin at bedtime.  03/22/18   Ghimire, Henreitta Leber, MD  metoprolol succinate (TOPROL XL) 50 MG 24 hr tablet Take 1 tablet (50 mg total) by mouth daily. Take with or immediately following a meal. 07/24/18 07/24/19  Rodriguez-Southworth, Sunday Spillers, PA-C  multivitamin (RENA-VIT) TABS tablet Take 1 tablet by mouth at bedtime. 01/29/17   Georgette Shell, MD  norethindrone (MICRONOR,CAMILA,ERRIN) 0.35 MG tablet Take 1 tablet (0.35 mg total) by mouth daily. 01/29/17   Georgette Shell, MD  ondansetron (ZOFRAN ODT) 4 MG disintegrating tablet Take 1 tablet (4 mg total) by mouth every 8 (eight) hours as needed for nausea or vomiting. 07/19/18   Antonietta Breach, PA-C  SENSIPAR 60 MG tablet Take 60 mg by mouth daily. 03/16/18   [provider]    Family History Family History  Problem Relation Age of Onset   Diabetes Maternal Grandmother     Stroke Maternal Grandmother    Cancer Maternal Aunt        aunt, passed last month, ?RCC   Cancer Maternal Uncle     Social History Social History   Tobacco Use   Smoking status: Never Smoker   Smokeless tobacco: Never Used  Substance Use Topics   Alcohol use: No    Alcohol/week: 0.0 standard drinks   Drug use: No     Allergies   Patient has no known allergies.   Review of Systems Review of Systems  Constitutional: Negative for chills and fever.  HENT: Negative for congestion and ear pain.   Eyes: Positive for visual disturbance.  Respiratory: Negative for cough and shortness of breath.   Cardiovascular: Negative for chest pain and palpitations.  Gastrointestinal: Positive for nausea and vomiting. Negative for abdominal pain, blood in stool, constipation and diarrhea.  Genitourinary:       Minimal urine output if any @ baseline  Neurological: Positive for dizziness, tremors, weakness (RLE intermittent dragging) and headaches. Negative for syncope, speech difficulty and numbness.  All  other systems reviewed and are negative.  Physical Exam Updated Vital Signs BP 129/78    Pulse 92    Temp 98.9 F (37.2 C) (Oral)    Resp 16    Ht 5\' 1"  (1.549 m)    Wt 59 kg    LMP 09/28/2018 (Exact Date)    SpO2 99%    BMI 24.56 kg/m   Physical Exam Vitals signs and nursing note reviewed.  Constitutional:      General: She is not in acute distress.    Appearance: She is well-developed.  HENT:     Head: Normocephalic and atraumatic.     Right Ear: Ear canal normal. Tympanic membrane is not perforated, erythematous, retracted or bulging.     Left Ear: Ear canal normal. Tympanic membrane is not perforated, erythematous, retracted or bulging.     Ears:     Comments: No mastoid erythema/swelling/tenderness.     Nose:     Right Sinus: No maxillary sinus tenderness or frontal sinus tenderness.     Left Sinus: No maxillary sinus tenderness or frontal sinus tenderness.      Mouth/Throat:     Pharynx: Uvula midline. No oropharyngeal exudate or posterior oropharyngeal erythema.     Comments: Posterior oropharynx is symmetric appearing. Patient tolerating own secretions without difficulty. No trismus. No drooling. No hot potato voice. No swelling beneath the tongue, submandibular compartment is soft.  Eyes:     General:        Right eye: No discharge.        Left eye: No discharge.     Extraocular Movements: Extraocular movements intact.     Conjunctiva/sclera: Conjunctivae normal.     Pupils: Pupils are equal, round, and reactive to light.     Comments: No vertical or rotational nystagmus.   Neck:     Musculoskeletal: Normal range of motion and neck supple. No edema or neck rigidity.  Cardiovascular:     Rate and Rhythm: Normal rate and regular rhythm.     Heart sounds: No murmur.  Pulmonary:     Effort: Pulmonary effort is normal. No respiratory distress.     Breath sounds: Normal breath sounds. No wheezing, rhonchi or rales.  Abdominal:     General: There is no distension.     Palpations: Abdomen is soft.     Tenderness: There is no abdominal tenderness.  Musculoskeletal:     Comments: LUE AV fistula in place w/ palpable thrill.   Lymphadenopathy:     Cervical: No cervical adenopathy.  Skin:    General: Skin is warm and dry.     Findings: No rash.  Neurological:     Mental Status: She is alert.     Comments: Alert. Clear speech. No facial droop. CNIII-XII grossly.  Bilateral upper and lower extremities' sensation grossly intact. 5/5 symmetric strength with grip strength and with plantar and dorsi flexion bilaterally.. Normal finger to nose bilaterally. Normal heel to shin bilaterally. Negative pronator drift. Negative Romberg sign. Gait intact w/o obvious foot drop.    Psychiatric:        Behavior: Behavior normal.    ED Treatments / Results  Labs (all labs ordered are listed, but only abnormal results are displayed) Labs Reviewed  CBC WITH  DIFFERENTIAL/PLATELET - Abnormal; Notable for the following components:      Result Value   RBC 3.63 (*)    Hemoglobin 10.5 (*)    HCT 32.4 (*)    All other components within normal  limits  COMPREHENSIVE METABOLIC PANEL - Abnormal; Notable for the following components:   Sodium 132 (*)    Chloride 90 (*)    Glucose, Bld 338 (*)    BUN 44 (*)    Creatinine, Ser 7.55 (*)    Calcium 8.5 (*)    GFR calc non Af Amer 7 (*)    GFR calc Af Amer 8 (*)    Anion gap 19 (*)    All other components within normal limits  CBG MONITORING, ED - Abnormal; Notable for the following components:   Glucose-Capillary 268 (*)    All other components within normal limits  CBG MONITORING, ED - Abnormal; Notable for the following components:   Glucose-Capillary 331 (*)    All other components within normal limits  CBG MONITORING, ED - Abnormal; Notable for the following components:   Glucose-Capillary 342 (*)    All other components within normal limits  I-STAT BETA HCG BLOOD, ED ([facility], [facility], [facility] ONLY)    EKG EKG Interpretation  Date/Time:  Sunday Oct 29 2018 12:42:52 EDT Ventricular Rate:  88 PR Interval:    QRS Duration: 88 QT Interval:  394 QTC Calculation: 477 R Axis:   72 Text Interpretation:  Normal sinus rhythm Confirmed by Pattricia Boss [phone]) on 10/29/2018 1:18:36 PM   Radiology Ct Head Wo Contrast  Result Date: 10/29/2018 CLINICAL DATA:  28 year old female with acute headache, vertigo and dizziness for 6 days. EXAM: CT HEAD WITHOUT CONTRAST TECHNIQUE: Contiguous axial images were obtained from the base of the skull through the vertex without intravenous contrast. COMPARISON:  06/12/2018 CT and 04/13/2014 MR FINDINGS: Brain: No evidence of acute infarction, hemorrhage, hydrocephalus, extra-axial collection or mass lesion/mass effect. Vascular: No hyperdense vessel or unexpected calcification. Skull: Normal. Negative for fracture or focal lesion. Sinuses/Orbits: No acute finding. The mastoid air  cells, middle ears and inner ears are clear. Other: None. IMPRESSION: Unremarkable noncontrast head CT. Electronically Signed   By: Margarette Canada M.D.   On: 10/29/2018 13:48   Mr Brain Wo Contrast (neuro Protocol)  Result Date: 10/29/2018 CLINICAL DATA:  28 year old female with dizziness and headache for 1 week and near syncope. EXAM: MRI HEAD WITHOUT CONTRAST TECHNIQUE: Multiplanar, multiecho pulse sequences of the brain and surrounding structures were obtained without intravenous contrast. COMPARISON:  Head CT without contrast earlier today. Brain MRI 04/13/2014. FINDINGS: Brain: No restricted diffusion to suggest acute infarction. No midline shift, mass effect, evidence of mass lesion, ventriculomegaly, extra-axial collection or acute intracranial hemorrhage. Cervicomedullary junction and pituitary are within normal limits. Cerebral volume remains normal. Subtle T2 hyperintense abnormality identified in the area of punctate restricted diffusion in 2015 on series 15 image 16. Pearline Cables and white matter signal elsewhere appears within normal limits. No chronic cerebral blood products or encephalomalacia identified. Vascular: Major intracranial vascular flow voids appear stable since 2015. Skull and upper cervical spine: Negative. Sinuses/Orbits: Chronic postoperative changes to both globes. Otherwise negative orbits. Paranasal sinuses are clear today. Other: Mastoids remain clear. Visible internal auditory structures appear normal. Scalp and face soft tissues appear negative. IMPRESSION: 1.  No acute intracranial abnormality. 2. Subtle white matter encephalomalacia corresponding to the punctate 2015 nonhemorrhagic infarct. Otherwise negative noncontrast MRI appearance of the brain. Electronically Signed   By: Genevie Ann M.D.   On: 10/29/2018 17:42    Procedures Procedures (including critical care time)  Medications Ordered in ED Medications  prochlorperazine (COMPAZINE) injection 10 mg (10 mg Intravenous Given  10/29/18 1407)  sodium chloride 0.9 %  bolus 500 mL (0 mLs Intravenous Stopped 10/29/18 1802)  insulin aspart (novoLOG) injection 2 Units (2 Units Subcutaneous Given 10/29/18 1802)     Initial Impression / Assessment and Plan / ED Course  I have reviewed the triage vital signs and the nursing notes.  Pertinent labs & imaging results that were available during my care of the patient were reviewed by me and considered in my medical decision making (see chart for details).  Patient presents to the ED w/ multiple complaints including headache, visual disturbance, dizziness, RLE dragging, episode of shaking, and N/V.  Patient is nontoxic-appearing, no apparent distress, her vitals are WNL with the exception of her elevated blood pressure, she has not had her antihypertensive medication today.  She has a normal neurologic exam without focal deficits. She is > 24 hours from onset of sxs therefore not code stroke.  We will proceed with labs and CT head without contrast, anticipate need for MRI given she states this feels similar to prior stroke.  Will trial migraine cocktail.  Work- up reviewed:  CBC: No leukocytosis. Baseline anemia.  CMP: mild electrolyte abnormalities as above, not hyperkalemic. Renal function consistent w/ ESRD Hyperglycemia, no acidosis, Anion gap elevated- will give 500 cc fluid bolus.  Not pregnant.  EKG: NSR.  CT head: Negative for acute abnormality.   Ultimately proceeded w/ MRI:  No acute intracranial abnormality. Subtle white matter encephalomalacia corresponding to the punctate 2015 nonhemorrhagic infarct.   Patient feeling improved following migraine cocktail. Tolerating PO. BP improved. Patient w/ continued hyperglycemia following fluids & small dose of insulin (2 units as this is what she uses at home when she has not eaten)- she states she feels comfortable going home and managing this with her at home regiment which myself & Dr. Jeanell Sparrow feel is reasonable. Overall unclear  definitive etiology to her sxs- will have her follow up closely outpatient w/ neurology. I discussed results, treatment plan, need for follow-up, and strict return precautions with the patient. Provided opportunity for questions, patient confirmed understanding and is in agreement with plan.   Vitals:   10/29/18 1800 10/29/18 1915  BP: (!) 163/98 129/78  Pulse: 92 92  Resp: 16 16  Temp:    SpO2: 99% 99%     Final Clinical Impressions(s) / ED Diagnoses   Final diagnoses:  Dizziness    ED Discharge Orders    None       Leafy Kindle 10/29/18 2007    Pattricia Boss, MD 10/30/18 1739

## 2018-10-29 NOTE — ED Triage Notes (Signed)
Pt endorses intermittent dizziness since Monday and near syncopal episode on arrival. Pt endorses migraines since Monday, currently 9/10. Pt is alert and oriented. Pt endorses right leg weakness x1 week. Pt endorses intermittent slurred speech, currently at baseline.

## 2018-10-29 NOTE — Discharge Instructions (Signed)
You were seen in the ER today for dizziness and headache. Your MRI did not show a stroke.  Your labs showed that your blood sugar is high, but you do not appear to be fully in DKA.  We like you to use your insulin as prescribed.  Follow-up with primary care as well as neurology within the next 2 to 3 days.  Return to the ER for new or worsening symptoms or any other concerns.

## 2018-11-13 ENCOUNTER — Encounter: Payer: Self-pay | Admitting: Nurse Practitioner

## 2018-12-19 ENCOUNTER — Emergency Department (HOSPITAL_COMMUNITY)
Admission: EM | Admit: 2018-12-19 | Discharge: 2018-12-19 | Disposition: A | Discharge status: Home / Self Care | Payer: Medicaid Other | Attending: Emergency Medicine | Admitting: Emergency Medicine

## 2018-12-19 ENCOUNTER — Other Ambulatory Visit: Payer: Self-pay

## 2018-12-19 ENCOUNTER — Encounter (HOSPITAL_COMMUNITY): Payer: Self-pay

## 2018-12-19 DIAGNOSIS — E1022 Type 1 diabetes mellitus with diabetic chronic kidney disease: Secondary | ICD-10-CM | POA: Diagnosis not present

## 2018-12-19 DIAGNOSIS — Z8673 Personal history of transient ischemic attack (TIA), and cerebral infarction without residual deficits: Secondary | ICD-10-CM | POA: Insufficient documentation

## 2018-12-19 DIAGNOSIS — I120 Hypertensive chronic kidney disease with stage 5 chronic kidney disease or end stage renal disease: Secondary | ICD-10-CM | POA: Insufficient documentation

## 2018-12-19 DIAGNOSIS — Y658 Other specified misadventures during surgical and medical care: Secondary | ICD-10-CM | POA: Insufficient documentation

## 2018-12-19 DIAGNOSIS — Z992 Dependence on renal dialysis: Secondary | ICD-10-CM | POA: Diagnosis not present

## 2018-12-19 DIAGNOSIS — Z794 Long term (current) use of insulin: Secondary | ICD-10-CM | POA: Insufficient documentation

## 2018-12-19 DIAGNOSIS — R58 Hemorrhage, not elsewhere classified: Secondary | ICD-10-CM

## 2018-12-19 DIAGNOSIS — T82838A Hemorrhage of vascular prosthetic devices, implants and grafts, initial encounter: Secondary | ICD-10-CM | POA: Insufficient documentation

## 2018-12-19 DIAGNOSIS — Z79899 Other long term (current) drug therapy: Secondary | ICD-10-CM | POA: Insufficient documentation

## 2018-12-19 DIAGNOSIS — N186 End stage renal disease: Secondary | ICD-10-CM | POA: Diagnosis not present

## 2018-12-19 LAB — CBG MONITORING, ED
Glucose-Capillary: 89 mg/dL (ref 70–99)
Glucose-Capillary: 140 mg/dL — ABNORMAL HIGH (ref 70–99)

## 2018-12-19 NOTE — ED Notes (Signed)
structions discussed and pt verbalizes undersftanding.

## 2018-12-19 NOTE — Discharge Instructions (Addendum)
Keep the dressing on for 24 hours, return to the ED as needed for recurrent bleeding

## 2018-12-19 NOTE — ED Triage Notes (Signed)
EMS transported from dialysis.  Patient received full treatment today, but when deaccessed, unable to get bleeding to stop , ems was called and unable to get bleed to stop without clamp that is placed on arm.

## 2018-12-19 NOTE — ED Provider Notes (Signed)
[facility] EMERGENCY DEPARTMENT Provider Note   CSN: [id number] Arrival date & time: 12/19/18  1356    History   Chief Complaint Chief Complaint  Patient presents with  . Bleeding/Bruising    HPI [name] is a 28 y.o. female.     HPI Patient presents to the ED for bleeding from her AV fistula.  Patient was at her routine dialysis today.  When they went to remove the access needles the patient started bleeding from the puncture wound.  They attempted to apply pressure but the bleeding restarted.  Patient states she had a dressing applied for at least an hour or 2.  They were going to release her but as they went to put on the dressing she started to bleed again.  Patient was then transported to ED for evaluation.  She denies any other complaints.  She is not having any trouble with numbness or weakness.  She is not having any shortness of breath or fever.  The site is no longer bleeding after the dressing and pressure clamp was applied. Past Medical History:  Diagnosis Date  . Cardiac arrest ([place])   . Complication of anesthesia   . Diabetes mellitus type 1 ([place])    2005 dx [facility]  . DKA (diabetic ketoacidoses) ([place])   . ESRD (end stage renal disease) ([facility])    TTS HD, dialyzed at [facility]  . Hypertension   . PONV (postoperative nausea and vomiting)   . Stroke [facility])     Patient Active Problem List   Diagnosis Date Noted  . Diabetic ketoacidosis without coma associated with type 1 diabetes mellitus ([place]) 10/25/2018  . Hyperkalemia 10/25/2018  . Hyperglycemia due to type 1 diabetes mellitus ([place]) 10/23/2018  . Essential hypertension 08/15/2018  . Lobar pneumonia ([place]) 06/27/2018  . Dizziness 03/22/2018  . Multifocal pneumonia 05/14/2017  . Normocytic normochromic anemia 01/26/2017  . ESRD (end stage renal disease) ([place]) 01/26/2017  . Fluid overload 01/26/2017  . Depression 07/04/2014  . Hemodialysis-associated hypotension  04/24/2014  . Cerebral infarction due to thrombosis of right middle cerebral artery ([place]) 04/24/2014  . History of anemia 04/17/2014  . Cardiac arrest ([place]) 04/14/2014  . Diabetic neuropathy ([place]) 02/14/2014  . DKA, type 1 ([place]) 02/12/2014  . Tachycardia 02/12/2014  . DOE (dyspnea on exertion) 02/12/2014  . Abdominal pain 02/12/2014  . IDDM (insulin dependent diabetes mellitus) ([place]) 02/12/2014  . Type I diabetes mellitus with renal manifestations, uncontrolled ([place]) 12/08/2013  . Hypokalemia 12/07/2013  . Noncompliance 12/06/2013  . DKA (diabetic ketoacidoses) ([place]) 12/05/2013  . Leukocytosis 12/05/2013  . Hypercalcemia 12/05/2013  . Abnormal LFTs 12/05/2013  . Elevated lipase 12/05/2013    Past Surgical History:  Procedure Laterality Date  . CATARACT EXTRACTION     6 years ago  . DIALYSIS FISTULA CREATION    . IR FLUORO GUIDE CV LINE RIGHT  01/26/2017  . IR REMOVAL TUN CV CATH W/O FL  05/16/2017  . IR US GUIDE VASC ACCESS RIGHT  01/26/2017     OB History   No obstetric history on file.      Home Medications    Prior to Admission medications   Medication Sig Start Date End Date Taking? Authorizing Provider  amLODipine (NORVASC) 5 MG tablet One bid Patient taking differently: Take 5 mg by mouth 2 (two) times daily.  07/13/18   Rodriguez-Southworth, Sunday Spillers, PA-C  calcium acetate (PHOSLO) 667 MG capsule Take 3 capsules (2,001 mg total) by mouth 3 (  three) times daily with meals. Patient taking differently: Take 1,334 mg by mouth 3 (three) times daily with meals.  05/16/17   Rai, Vernelle Emerald, MD  Famotidine 20 MG CHEW Chew 20 mg by mouth as needed for heartburn or indigestion.    [provider]  insulin aspart (NOVOLOG) 100 UNIT/ML injection Inject 0-9 Units into the skin 3 (three) times daily with meals. Patient taking differently: Inject 3 Units into the skin 3 (three) times daily with meals.  01/29/17   Georgette Shell, MD  insulin glargine (LANTUS) 100 UNIT/ML  injection Inject 0.22 mLs (22 Units total) into the skin at bedtime. Patient taking differently: Inject 15 Units into the skin at bedtime.  03/22/18   Ghimire, Henreitta Leber, MD  metoprolol succinate (TOPROL XL) 50 MG 24 hr tablet Take 1 tablet (50 mg total) by mouth daily. Take with or immediately following a meal. 07/24/18 07/24/19  Rodriguez-Southworth, Sunday Spillers, PA-C  multivitamin (RENA-VIT) TABS tablet Take 1 tablet by mouth at bedtime. 01/29/17   Georgette Shell, MD  norethindrone (MICRONOR,CAMILA,ERRIN) 0.35 MG tablet Take 1 tablet (0.35 mg total) by mouth daily. 01/29/17   Georgette Shell, MD  ondansetron (ZOFRAN ODT) 4 MG disintegrating tablet Take 1 tablet (4 mg total) by mouth every 8 (eight) hours as needed for nausea or vomiting. 07/19/18   Antonietta Breach, PA-C  SENSIPAR 60 MG tablet Take 60 mg by mouth daily. 03/16/18   [provider]    Family History Family History  Problem Relation Age of Onset  . Diabetes Maternal Grandmother   . Stroke Maternal Grandmother   . Cancer Maternal Aunt        aunt, passed last month, ?RCC  . Cancer Maternal Uncle     Social History Social History   Tobacco Use  . Smoking status: Never Smoker  . Smokeless tobacco: Never Used  Substance Use Topics  . Alcohol use: No    Alcohol/week: 0.0 standard drinks  . Drug use: No     Allergies   Patient has no known allergies.   Review of Systems Review of Systems  All other systems reviewed and are negative.    Physical Exam Updated Vital Signs Ht 1.549 m (5\' 1" )   Wt 59 kg   BMI 24.56 kg/m   Physical Exam Vitals signs and nursing note reviewed.  Constitutional:      General: She is not in acute distress.    Appearance: She is well-developed.  HENT:     Head: Normocephalic and atraumatic.     Right Ear: External ear normal.     Left Ear: External ear normal.  Eyes:     General: No scleral icterus.       Right eye: No discharge.        Left eye: No discharge.      Conjunctiva/sclera: Conjunctivae normal.  Neck:     Musculoskeletal: Neck supple.     Trachea: No tracheal deviation.  Cardiovascular:     Rate and Rhythm: Normal rate and regular rhythm.  Pulmonary:     Effort: Pulmonary effort is normal. No respiratory distress.     Breath sounds: No stridor.  Abdominal:     General: There is no distension.  Musculoskeletal:        General: No swelling or deformity.     Comments: Dressing and clamp overlying left AV fistula, no evidence of bleeding  Skin:    General: Skin is warm and dry.  Findings: No rash.  Neurological:     Mental Status: She is alert.     Cranial Nerves: Cranial nerve deficit: no gross deficits.      ED Treatments / Results  Labs (all labs ordered are listed, but only abnormal results are displayed) Labs Reviewed  CBG MONITORING, ED    EKG EKG Interpretation  Date/Time:  Tuesday December 19 2018 14:03:46 EDT Ventricular Rate:  94 PR Interval:    QRS Duration: 88 QT Interval:  347 QTC Calculation: 434 R Axis:   84 Text Interpretation:  Sinus rhythm Borderline short PR interval No significant change since last tracing Confirmed by Dorie Rank [id number]) on 12/19/2018 2:13:35 PM   Radiology No results found.  Procedures Procedures (including critical care time)  Medications Ordered in ED Medications - No data to display   Initial Impression / Assessment and Plan / ED Course  I have reviewed the triage vital signs and the nursing notes.  Pertinent labs & imaging results that were available during my care of the patient were reviewed by me and considered in my medical decision making (see chart for details).  Clinical Course as of Dec 19 1507  Tue Dec 19, 2018  1415 Pressure clamp was removed from the left AV fistula.  No signs of active bleeding.  We will continue to observe   [JK]    Clinical Course User Index [JK] Dorie Rank, MD     She was monitored in the ED.  No evidence of recurrent bleeding.  I  will have the patient keep the dressing on in place for the next 24 hours to avoid dislodging the clot.  Final Clinical Impressions(s) / ED Diagnoses   Final diagnoses:  Bleeding    ED Discharge Orders    None       Dorie Rank, MD 12/19/18 [phone]

## 2018-12-19 NOTE — ED Notes (Signed)
No bleeding noted at left access site.  Good distal radial pulse.

## 2018-12-19 NOTE — ED Notes (Signed)
Pt eating crackers and tolerating well.

## 2019-01-25 ENCOUNTER — Ambulatory Visit: Payer: Medicaid Other | Admitting: Internal Medicine

## 2019-02-06 ENCOUNTER — Other Ambulatory Visit: Payer: Self-pay

## 2019-02-06 ENCOUNTER — Encounter: Payer: Self-pay | Admitting: Vascular Surgery

## 2019-02-06 ENCOUNTER — Ambulatory Visit (INDEPENDENT_AMBULATORY_CARE_PROVIDER_SITE_OTHER): Payer: Medicaid Other | Admitting: Vascular Surgery

## 2019-02-06 VITALS — BP 158/93 | HR 91 | Temp 98.5°F | Resp 14 | Ht 61.0 in | Wt 128.0 lb

## 2019-02-06 DIAGNOSIS — N186 End stage renal disease: Secondary | ICD-10-CM

## 2019-02-06 NOTE — Progress Notes (Signed)
Patient name: [name] MRN: [id number] DOB: [birth year] Sex: female  REASON FOR VISIT: Prolonged bleeding left arm fistula  HPI: [name] is a 28 y.o. female who presents for evaluation of her left arm AV fistula for prolonged bleeding after dialysis.  Patient states the fistula was placed at [facility] and appears to be a brachiobasilic.  She had a previously failed fistula below the elbow in the left arm.  She states she had prolonged bleeding but then went to [facility] several weeks ago and its working fine since.  States there is already a stent higher in the fistula.  States the fistula worked fine earlier today.  Past Medical History:  Diagnosis Date  . Cardiac arrest ([place])   . Complication of anesthesia   . Diabetes mellitus type 1 ([place])    [facility] dx [facility]  . DKA (diabetic ketoacidoses) ([place])   . ESRD (end stage renal disease) ([facility])    TTS HD, dialyzed at [facility]  . Hypertension   . PONV (postoperative nausea and vomiting)   . Stroke [facility])     Past Surgical History:  Procedure Laterality Date  . CATARACT EXTRACTION     6 years ago  . DIALYSIS FISTULA CREATION    . IR FLUORO GUIDE CV LINE RIGHT  01/26/2017  . IR REMOVAL TUN CV CATH W/O FL  05/16/2017  . IR US GUIDE VASC ACCESS RIGHT  01/26/2017    Family History  Problem Relation Age of Onset  . Diabetes Maternal Grandmother   . Stroke Maternal Grandmother   . Cancer Maternal Aunt        aunt, passed last month, ?RCC  . Cancer Maternal Uncle     SOCIAL HISTORY: Social History   Tobacco Use  . Smoking status: Never Smoker  . Smokeless tobacco: Never Used  Substance Use Topics  . Alcohol use: No    Alcohol/week: 0.0 standard drinks    No Known Allergies  Current Outpatient Medications  Medication Sig Dispense Refill  . amLODipine (NORVASC) 5 MG tablet One bid (Patient taking differently: Take 5 mg by mouth 2 (two) times daily. ) 60 tablet 0  . calcium acetate (PHOSLO)  667 MG capsule Take 3 capsules (2,001 mg total) by mouth 3 (three) times daily with meals. (Patient taking differently: Take 1,334 mg by mouth 3 (three) times daily with meals. ) 180 capsule 2  . Famotidine 20 MG CHEW Chew 20 mg by mouth as needed for heartburn or indigestion.    . insulin aspart (NOVOLOG) 100 UNIT/ML injection Inject 0-9 Units into the skin 3 (three) times daily with meals. (Patient taking differently: Inject 3 Units into the skin 3 (three) times daily with meals. ) 10 mL 11  . insulin glargine (LANTUS) 100 UNIT/ML injection Inject 0.22 mLs (22 Units total) into the skin at bedtime. (Patient taking differently: Inject 15 Units into the skin at bedtime. ) 10 mL 0  . metoprolol succinate (TOPROL XL) 50 MG 24 hr tablet Take 1 tablet (50 mg total) by mouth daily. Take with or immediately following a meal. 90 tablet 0  . multivitamin (RENA-VIT) TABS tablet Take 1 tablet by mouth at bedtime. 30 tablet 0  . norethindrone (MICRONOR,CAMILA,ERRIN) 0.35 MG tablet Take 1 tablet (0.35 mg total) by mouth daily. 1 Package 11  . ondansetron (ZOFRAN ODT) 4 MG disintegrating tablet Take 1 tablet (4 mg total) by mouth every 8 (eight) hours as needed for nausea or vomiting. 10  tablet 0  . SENSIPAR 60 MG tablet Take 60 mg by mouth daily.  5   No current facility-administered medications for this visit.     REVIEW OF SYSTEMS:  [X]  denotes positive finding, [ ]  denotes negative finding Cardiac  Comments:  Chest pain or chest pressure:    Shortness of breath upon exertion:    Short of breath when lying flat:    Irregular heart rhythm:        Vascular    Pain in calf, thigh, or hip brought on by ambulation:    Pain in feet at night that wakes you up from your sleep:     Blood clot in your veins:    Leg swelling:         Pulmonary    Oxygen at home:    Productive cough:     Wheezing:         Neurologic    Sudden weakness in arms or legs:     Sudden numbness in arms or legs:     Sudden  onset of difficulty speaking or slurred speech:    Temporary loss of vision in one eye:     Problems with dizziness:         Gastrointestinal    Blood in stool:     Vomited blood:         Genitourinary    Burning when urinating:     Blood in urine:        Psychiatric    Major depression:         Hematologic    Bleeding problems:    Problems with blood clotting too easily:        Skin    Rashes or ulcers:        Constitutional    Fever or chills:      PHYSICAL EXAM: Vitals:   02/06/19 1457  BP: (!) 158/93  Pulse: 91  Resp: 14  Temp: 98.5 F (36.9 C)  TempSrc: Temporal  SpO2: 100%  Weight: 128 lb (58.1 kg)  Height: 5\' 1"  (1.549 m)    GENERAL: The patient is a well-nourished female, in no acute distress. The vital signs are documented above. CARDIAC: There is a regular rate and rhythm.  VASCULAR:  Left upper arm basilic vein fistula with good thrill Left radial pulse palpable  DATA:   None  Assessment/Plan:  29 year old female who presents for evaluation of her left arm AV fistula for prolonged bleeding.  Unfortunately the referral was made to [place] several weeks ago and she has since been seen by [facility] and had an intervention with resolution of her prolonged bleeding.  She suggested that they performed balloon angioplasty.  We contacted her dialysis center and they confirm that her fistula is working fine now.  Discussed with patient that she can follow-up with [place] as needed.   Marty Heck, MD [facility] Office: [phone] Pager: [phone]

## 2019-03-05 DIAGNOSIS — R87810 Cervical high risk human papillomavirus (HPV) DNA test positive: Secondary | ICD-10-CM | POA: Insufficient documentation

## 2019-03-22 ENCOUNTER — Emergency Department (HOSPITAL_COMMUNITY)
Admission: EM | Admit: 2019-03-22 | Discharge: 2019-03-22 | Disposition: A | Discharge status: Home / Self Care | Payer: Medicaid Other | Attending: Emergency Medicine | Admitting: Emergency Medicine

## 2019-03-22 ENCOUNTER — Other Ambulatory Visit: Payer: Self-pay

## 2019-03-22 ENCOUNTER — Encounter (HOSPITAL_COMMUNITY): Payer: Self-pay | Admitting: Emergency Medicine

## 2019-03-22 DIAGNOSIS — Z5321 Procedure and treatment not carried out due to patient leaving prior to being seen by health care provider: Secondary | ICD-10-CM | POA: Diagnosis not present

## 2019-03-22 DIAGNOSIS — T8249XA Other complication of vascular dialysis catheter, initial encounter: Secondary | ICD-10-CM | POA: Insufficient documentation

## 2019-03-22 DIAGNOSIS — Y828 Other medical devices associated with adverse incidents: Secondary | ICD-10-CM | POA: Insufficient documentation

## 2019-03-22 NOTE — ED Provider Notes (Signed)
1530: Pt LWBS. Went to room twice and she wasn't there.    Kinnie Feil, PA-C 03/22/19 1533    Quintella Reichert, MD 03/22/19 [phone]

## 2019-03-22 NOTE — ED Triage Notes (Signed)
Per [facility] pt was finished at dialysis and when removing needle from dialysis port wouldn't stop bleeding.

## 2019-03-22 NOTE — ED Notes (Signed)
Patient left and told RN Meghan she was leaving as she was no longer bleeding. Patient did not sign AMA. Patient was no in any active distress and was alert and oriented.

## 2019-03-30 DIAGNOSIS — T782XXS Anaphylactic shock, unspecified, sequela: Secondary | ICD-10-CM

## 2019-03-30 HISTORY — DX: Anaphylactic shock, unspecified, sequela: T78.2XXS

## 2019-04-28 ENCOUNTER — Other Ambulatory Visit: Payer: Self-pay

## 2019-04-28 ENCOUNTER — Emergency Department (HOSPITAL_COMMUNITY)
Admission: EM | Admit: 2019-04-28 | Discharge: 2019-04-28 | Disposition: A | Discharge status: Home / Self Care | Payer: Medicaid Other | Attending: Emergency Medicine | Admitting: Emergency Medicine

## 2019-04-28 ENCOUNTER — Encounter (HOSPITAL_COMMUNITY): Payer: Self-pay | Admitting: *Deleted

## 2019-04-28 DIAGNOSIS — Z992 Dependence on renal dialysis: Secondary | ICD-10-CM | POA: Insufficient documentation

## 2019-04-28 DIAGNOSIS — R112 Nausea with vomiting, unspecified: Secondary | ICD-10-CM | POA: Diagnosis not present

## 2019-04-28 DIAGNOSIS — Z79899 Other long term (current) drug therapy: Secondary | ICD-10-CM | POA: Diagnosis not present

## 2019-04-28 DIAGNOSIS — I120 Hypertensive chronic kidney disease with stage 5 chronic kidney disease or end stage renal disease: Secondary | ICD-10-CM | POA: Insufficient documentation

## 2019-04-28 DIAGNOSIS — N186 End stage renal disease: Secondary | ICD-10-CM | POA: Insufficient documentation

## 2019-04-28 DIAGNOSIS — E109 Type 1 diabetes mellitus without complications: Secondary | ICD-10-CM | POA: Insufficient documentation

## 2019-04-28 LAB — CBC WITH DIFFERENTIAL/PLATELET
Abs Immature Granulocytes: 0.03 10*3/uL (ref 0.00–0.07)
Basophils Absolute: 0.1 10*3/uL (ref 0.0–0.1)
Basophils Relative: 1 %
Eosinophils Absolute: 0.1 10*3/uL (ref 0.0–0.5)
Eosinophils Relative: 1 %
HCT: 39.4 % (ref 36.0–46.0)
Hemoglobin: 12.4 g/dL (ref 12.0–15.0)
Immature Granulocytes: 0 %
Lymphocytes Relative: 9 %
Lymphs Abs: 0.8 10*3/uL (ref 0.7–4.0)
MCH: 29.2 pg (ref 26.0–34.0)
MCHC: 31.5 g/dL (ref 30.0–36.0)
MCV: 92.7 fL (ref 80.0–100.0)
Monocytes Absolute: 0.4 10*3/uL (ref 0.1–1.0)
Monocytes Relative: 4 %
Neutro Abs: 8.2 10*3/uL — ABNORMAL HIGH (ref 1.7–7.7)
Neutrophils Relative %: 85 %
Platelets: 285 10*3/uL (ref 150–400)
RBC: 4.25 MIL/uL (ref 3.87–5.11)
RDW: 15.5 % (ref 11.5–15.5)
WBC: 9.6 10*3/uL (ref 4.0–10.5)
nRBC: 0 % (ref 0.0–0.2)

## 2019-04-28 LAB — COMPREHENSIVE METABOLIC PANEL
ALT: 23 U/L (ref 0–44)
AST: 31 U/L (ref 15–41)
Albumin: 3.9 g/dL (ref 3.5–5.0)
Alkaline Phosphatase: 64 U/L (ref 38–126)
Anion gap: 14 (ref 5–15)
BUN: 25 mg/dL — ABNORMAL HIGH (ref 6–20)
CO2: 23 mmol/L (ref 22–32)
Calcium: 11.1 mg/dL — ABNORMAL HIGH (ref 8.9–10.3)
Chloride: 99 mmol/L (ref 98–111)
Creatinine, Ser: 6.47 mg/dL — ABNORMAL HIGH (ref 0.44–1.00)
GFR calc Af Amer: 9 mL/min — ABNORMAL LOW (ref >60)
GFR calc non Af Amer: 8 mL/min — ABNORMAL LOW (ref >60)
Glucose, Bld: 81 mg/dL (ref 70–99)
Potassium: 4.2 mmol/L (ref 3.5–5.1)
Sodium: 136 mmol/L (ref 135–145)
Total Bilirubin: 0.8 mg/dL (ref 0.3–1.2)
Total Protein: 7 g/dL (ref 6.5–8.1)

## 2019-04-28 LAB — I-STAT BETA HCG BLOOD, ED (MC, WL, AP ONLY): I-stat hCG, quantitative: <5 m[IU]/mL (ref <5)

## 2019-04-28 LAB — LIPASE, BLOOD: Lipase: 24 U/L (ref 11–51)

## 2019-04-28 LAB — CBG MONITORING, ED: Glucose-Capillary: 86 mg/dL (ref 70–99)

## 2019-04-28 MED ORDER — DIPHENHYDRAMINE HCL 50 MG/ML IJ SOLN
25.0000 mg | Freq: Once | INTRAMUSCULAR | Status: AC
  Administered 2019-04-28: 25 mg via INTRAMUSCULAR
  Filled 2019-04-28: qty 1

## 2019-04-28 MED ORDER — PROMETHAZINE HCL 25 MG PO TABS: 25.0000 mg | ORAL_TABLET | Freq: Four times a day (QID) | ORAL | 0 refills | Status: AC | PRN

## 2019-04-28 MED ORDER — DROPERIDOL 2.5 MG/ML IJ SOLN
5.0000 mg | Freq: Once | INTRAMUSCULAR | Status: AC
  Administered 2019-04-28: 5 mg via INTRAMUSCULAR
  Filled 2019-04-28: qty 2

## 2019-04-28 NOTE — ED Triage Notes (Signed)
CBG 86. 

## 2019-04-28 NOTE — ED Triage Notes (Addendum)
Pt from Dialysis treatment center via EMS because of vomiting during dialysis. Pt did not finish the Dialysis treatment . Pt reports a bad HA that started one Hr ago. Nausea 3 days ago. Pt reports living alone . EMS Vitals 206/110, HR 94 , CBG 138, SPO2  98 , R 18. Pt reports BP is higher than normal.

## 2019-04-28 NOTE — ED Notes (Signed)
Patient verbalizes understanding of discharge instructions . Opportunity for questions and answers were provided . Armband removed by staff ,Pt discharged from ED. W/C  offered at D/C  and Declined W/C at D/C and was escorted to lobby by RN.  

## 2019-04-28 NOTE — Discharge Instructions (Signed)

## 2019-04-28 NOTE — ED Provider Notes (Signed)
[place] EMERGENCY DEPARTMENT Provider Note   CSN: [id number] Arrival date & time: 04/28/19  1029     History   Chief Complaint Chief Complaint  Patient presents with  . Emesis    HPI VERALYN YURKO is a 28 y.o. female who has an extensive pmh including history of cardiac arrest, type 1 insulin-dependent diabetes, end-stage renal disease on hemodialysis Tuesday Thursday and Friday.  Patient got about 2 hours of dialysis prior to arrival.  She states that she began throwing up while she was at dialysis.  Patient states that she has had about 3 days of nausea.  She did have some vomiting yesterday as well.  She has had 4 episodes of nonbloody nonbilious vomitus today.  Patient has a history of DKA.  She denies fever chills she denies abdominal pain, fevers, chills, constipation, diarrhea, vaginal symptoms.  She does not make urine.     HPI  Past Medical History:  Diagnosis Date  . Cardiac arrest ([place])   . Complication of anesthesia   . Diabetes mellitus type 1 ([place])    2005 [facility]  . DKA (diabetic ketoacidoses) ([place])   . ESRD (end stage renal disease) ([facility])    TTS HD, dialyzed at [facility]  . Hypertension   . PONV (postoperative nausea and vomiting)   . Stroke [facility]     Patient Active Problem List   Diagnosis Date Noted  . Diabetic ketoacidosis without coma associated with type 1 diabetes mellitus ([place]) 10/25/2018  . Hyperkalemia 10/25/2018  . Hyperglycemia due to type 1 diabetes mellitus ([place]) 10/23/2018  . Essential hypertension 08/15/2018  . Lobar pneumonia ([place]) 06/27/2018  . Dizziness 03/22/2018  . Multifocal pneumonia 05/14/2017  . Normocytic normochromic anemia 01/26/2017  . ESRD (end stage renal disease) ([place]) 01/26/2017  . Fluid overload 01/26/2017  . Depression 07/04/2014  . Hemodialysis-associated hypotension 04/24/2014  . Cerebral infarction due to thrombosis of right middle cerebral artery ([place]) 04/24/2014  .  History of anemia 04/17/2014  . Cardiac arrest ([place]) 04/14/2014  . Diabetic neuropathy ([place]) 02/14/2014  . DKA, type 1 ([place]) 02/12/2014  . Tachycardia 02/12/2014  . DOE (dyspnea on exertion) 02/12/2014  . Abdominal pain 02/12/2014  . IDDM (insulin dependent diabetes mellitus) 02/12/2014  . Type I diabetes mellitus with renal manifestations, uncontrolled ([place]) 12/08/2013  . Hypokalemia 12/07/2013  . Noncompliance 12/06/2013  . DKA (diabetic ketoacidoses) ([place]) 12/05/2013  . Leukocytosis 12/05/2013  . Hypercalcemia 12/05/2013  . Abnormal LFTs 12/05/2013  . Elevated lipase 12/05/2013    Past Surgical History:  Procedure Laterality Date  . CATARACT EXTRACTION     6 years ago  . DIALYSIS FISTULA CREATION    . IR FLUORO GUIDE CV LINE RIGHT  01/26/2017  . IR REMOVAL TUN CV CATH W/O FL  05/16/2017  . IR US GUIDE VASC ACCESS RIGHT  01/26/2017     OB History   No obstetric history on file.      Home Medications    Prior to Admission medications   Medication Sig Start Date End Date Taking? Authorizing Provider  amLODipine (NORVASC) 5 MG tablet One bid Patient taking differently: Take 5 mg by mouth 2 (two) times daily.  07/13/18   Rodriguez-Southworth, Sunday Spillers, PA-C  calcium acetate (PHOSLO) 667 MG capsule Take 3 capsules (2,001 mg total) by mouth 3 (three) times daily with meals. Patient taking differently: Take 1,334 mg by mouth 3 (three) times daily with meals.  05/16/17   Rai, Ripudeep K,  MD  Famotidine 20 MG CHEW Chew 20 mg by mouth as needed for heartburn or indigestion.    [provider]  insulin aspart (NOVOLOG) 100 UNIT/ML injection Inject 0-9 Units into the skin 3 (three) times daily with meals. Patient taking differently: Inject 3 Units into the skin 3 (three) times daily with meals.  01/29/17   Georgette Shell, MD  insulin glargine (LANTUS) 100 UNIT/ML injection Inject 0.22 mLs (22 Units total) into the skin at bedtime. Patient taking differently: Inject 15 Units  into the skin at bedtime.  03/22/18   Ghimire, Henreitta Leber, MD  metoprolol succinate (TOPROL XL) 50 MG 24 hr tablet Take 1 tablet (50 mg total) by mouth daily. Take with or immediately following a meal. 07/24/18 07/24/19  Rodriguez-Southworth, Sunday Spillers, PA-C  multivitamin (RENA-VIT) TABS tablet Take 1 tablet by mouth at bedtime. 01/29/17   Georgette Shell, MD  norethindrone (MICRONOR,CAMILA,ERRIN) 0.35 MG tablet Take 1 tablet (0.35 mg total) by mouth daily. 01/29/17   Georgette Shell, MD  ondansetron (ZOFRAN ODT) 4 MG disintegrating tablet Take 1 tablet (4 mg total) by mouth every 8 (eight) hours as needed for nausea or vomiting. 07/19/18   Antonietta Breach, PA-C  SENSIPAR 60 MG tablet Take 60 mg by mouth daily. 03/16/18   [provider]    Family History Family History  Problem Relation Age of Onset  . Diabetes Maternal Grandmother   . Stroke Maternal Grandmother   . Cancer Maternal Aunt        aunt, passed last month, ?RCC  . Cancer Maternal Uncle     Social History Social History   Tobacco Use  . Smoking status: Never Smoker  . Smokeless tobacco: Never Used  Substance Use Topics  . Alcohol use: No    Alcohol/week: 0.0 standard drinks  . Drug use: No     Allergies   Patient has no known allergies.   Review of Systems Review of Systems  Ten systems reviewed and are negative for acute change, except as noted in the HPI.   Physical Exam Updated Vital Signs BP (!) 165/88 (BP Location: Right Arm)   Pulse 89   Temp 98.3 F (36.8 C) (Oral)   Resp 20   LMP 04/26/2019   SpO2 99%   Physical Exam Vitals signs and nursing note reviewed.  Constitutional:      General: She is not in acute distress.    Appearance: She is well-developed. She is ill-appearing. She is not diaphoretic.  HENT:     Head: Normocephalic and atraumatic.  Eyes:     General: No scleral icterus.    Conjunctiva/sclera: Conjunctivae normal.  Neck:     Musculoskeletal: Normal range of motion.   Cardiovascular:     Rate and Rhythm: Normal rate and regular rhythm.     Heart sounds: Normal heart sounds. No murmur. No friction rub. No gallop.   Pulmonary:     Effort: Pulmonary effort is normal. No respiratory distress.     Breath sounds: Normal breath sounds.  Abdominal:     General: Bowel sounds are normal. There is no distension.     Palpations: Abdomen is soft. There is no mass.     Tenderness: There is no abdominal tenderness. There is no guarding.  Skin:    General: Skin is warm and dry.  Neurological:     Mental Status: She is alert and oriented to person, place, and time.  Psychiatric:  Behavior: Behavior normal.      ED Treatments / Results  Labs (all labs ordered are listed, but only abnormal results are displayed) Labs Reviewed  CBC WITH DIFFERENTIAL/PLATELET - Abnormal; Notable for the following components:      Result Value   Neutro Abs 8.2 (*)    All other components within normal limits  COMPREHENSIVE METABOLIC PANEL - Abnormal; Notable for the following components:   BUN 25 (*)    Creatinine, Ser 6.47 (*)    Calcium 11.1 (*)    GFR calc non Af Amer 8 (*)    GFR calc Af Amer 9 (*)    All other components within normal limits  LIPASE, BLOOD  I-STAT BETA HCG BLOOD, ED ([facility], [facility], [facility] ONLY)  CBG MONITORING, ED    EKG None  Radiology No results found.  Procedures Procedures (including critical care time)  Medications Ordered in ED Medications  droperidol (INAPSINE) 2.5 MG/ML injection 5 mg (5 mg Intramuscular Given 04/28/19 1217)  diphenhydrAMINE (BENADRYL) injection 25 mg (25 mg Intramuscular Given 04/28/19 1217)     Initial Impression / Assessment and Plan / ED Course  I have reviewed the triage vital signs and the nursing notes.  Pertinent labs & imaging results that were available during my care of the patient were reviewed by me and considered in my medical decision making (see chart for details).        Adriauna B Hermann has  presented with complaint of vomiting. I have reviewed the labs which show negative pregnancy test, CBC shows no elevated white blood cell count or anemia.  Patient's CMP shows slightly elevated BUN.  Creatinine at baseline for her end-stage renal disease, no other anabolic complications including no anion gap acidosis suggestive of DKA. The differential diagnosis for Lashana B Berni is extensive and thus medical decision making is of high complexity. After review of the data points in the case the patient's presentation is most consistent with non-intractable nausea and vomiting.. The presentation is not consistent with emergent causes such as acute coronary syndrome, acute radiation syndrome, acute gastric dilatation, acetaminophen toxicity, adrenal insufficiency, appendicitis, salicylate toxicity, bowel obstruction or ileus, carbon monoxide poisoning, cholecystitis, digoxin toxicity, CNS tumor elevated intracranial pressures, outlet obstruction or gastric volvulus, incarcerated hernia, hyperemesis gravidarum, pancreatitis, peritonitis, ruptured viscus, testicular or ovarian torsion, or theophylline toxicity.  Similarly the presentation is not consistent with biliary colic, cannabinoid hyperemesis syndrome, chemotherapy reaction, gastroparesis, vertigo, migraine, motion sickness, narcotic withdrawal, peptic ulcer disease, renal colic or urinary tract infection.  Repeat examination of the patient shows a benign abdomen and the patient is tolerating p.o. fluids.  Patient is feeling greatly improved.   She has been able to hold down fluids. Her blood pressure has improved significantly with treatment of her nausea  I discussed all of the findings with the patient and I have delivered strict return precautions personally or by detailed written instruction delivered verbally by nursing staff to the patient/caregiver/family member.   Data Reviewed/Counseling: I have reviewed the patient's vital signs,  nursing notes, and other relevant tests/information. I had a detailed discussion regarding the historical points, exam findings, and any diagnostic results supporting the discharge diagnosis. I also discussed the need for outpatient follow-up and the need to return to the ED if symptoms worsen or if there are any questions or concerns that arise at home.   Final Clinical Impressions(s) / ED Diagnoses   Final diagnoses:  Non-intractable vomiting with nausea, unspecified vomiting type  ED Discharge Orders    None       Margarita Mail, PA-C 04/28/19 [place], [place], DO 04/28/19 1846

## 2019-04-28 NOTE — ED Triage Notes (Signed)
2 atemps to establish  A IV line but unable . Request  To Lab tech to draw blood.

## 2019-05-21 ENCOUNTER — Telehealth: Payer: Self-pay

## 2019-05-21 ENCOUNTER — Other Ambulatory Visit: Payer: Self-pay

## 2019-05-21 ENCOUNTER — Encounter: Payer: Self-pay | Admitting: Nurse Practitioner

## 2019-05-21 ENCOUNTER — Telehealth (INDEPENDENT_AMBULATORY_CARE_PROVIDER_SITE_OTHER): Payer: Medicaid Other | Admitting: Nurse Practitioner

## 2019-05-21 DIAGNOSIS — R0609 Other forms of dyspnea: Secondary | ICD-10-CM

## 2019-05-21 DIAGNOSIS — Z992 Dependence on renal dialysis: Secondary | ICD-10-CM

## 2019-05-21 DIAGNOSIS — R0600 Dyspnea, unspecified: Secondary | ICD-10-CM

## 2019-05-21 DIAGNOSIS — R059 Cough, unspecified: Secondary | ICD-10-CM

## 2019-05-21 DIAGNOSIS — R05 Cough: Secondary | ICD-10-CM | POA: Diagnosis not present

## 2019-05-21 MED ORDER — AMOXICILLIN 500 MG PO TABS: 500.0000 mg | ORAL_TABLET | Freq: Two times a day (BID) | ORAL | 0 refills | Status: DC

## 2019-05-21 NOTE — Telephone Encounter (Signed)
Left vm to offer virtual appt

## 2019-05-21 NOTE — Progress Notes (Signed)
Virtual Visit via Video   This visit type was conducted due to national recommendations for restrictions regarding the COVID-19 Pandemic (e.g. social distancing) in an effort to limit this patient's exposure and mitigate transmission in our community.  Due to her co-morbid illnesses, this patient is at least at moderate risk for complications without adequate follow up.  This format is felt to be most appropriate for this patient at this time.  All issues noted in this document were discussed and addressed.  A limited physical exam was performed with this format.    This visit type was conducted due to national recommendations for restrictions regarding the COVID-19 Pandemic (e.g. social distancing) in an effort to limit this patient's exposure and mitigate transmission in our community.  Patients identity confirmed using two different identifiers.  This format is felt to be most appropriate for this patient at this time.  All issues noted in this document were discussed and addressed.  No physical exam was performed (except for noted visual exam findings with Video Visits).    Date:  05/21/2019   ID:  [name], DOB [birth year], MRN [id number]  Patient Location:  Home - spoke with [facility]  Provider location:   Office    Chief Complaint:  Cough and shortness of breath  History of Present Illness:    [name] is a 28 y.o. female who presents via video conferencing for a telehealth visit today.    The patient does not have symptoms concerning for COVID-19 infection (fever, chills, cough, or new shortness of breath).   Had bad fluid overload from her dialysis.  Pneumonia She complains of cough, shortness of breath and wheezing. There is no chest tightness, difficulty breathing or sputum production. This is a new problem. The current episode started in the past 7 days. The problem has been unchanged. The cough is non-productive. Her past medical history  is significant for pneumonia. There is no history of asthma.     Past Medical History:  Diagnosis Date  . Cardiac arrest ([place])   . Complication of anesthesia   . Diabetes mellitus type 1 ([place])    [facility]  . DKA (diabetic ketoacidoses) ([place])   . ESRD (end stage renal disease) ([facility])    TTS HD, dialyzed at [facility]  . Hypertension   . PONV (postoperative nausea and vomiting)   . Stroke [facility])    Past Surgical History:  Procedure Laterality Date  . CATARACT EXTRACTION     6 years ago  . DIALYSIS FISTULA CREATION    . IR FLUORO GUIDE CV LINE RIGHT  01/26/2017  . IR REMOVAL TUN CV CATH W/O FL  05/16/2017  . IR US GUIDE VASC ACCESS RIGHT  01/26/2017     Current Meds  Medication Sig  . acetaminophen (TYLENOL) 500 MG tablet Take 1,000 mg by mouth every 6 (six) hours as needed for headache (pain).  Marland Kitchen amLODipine (NORVASC) 5 MG tablet One bid (Patient taking differently: Take 5 mg by mouth at bedtime. )  . calcium acetate (PHOSLO) 667 MG capsule Take 3 capsules (2,001 mg total) by mouth 3 (three) times daily with meals. (Patient taking differently: Take 667-2,001 mg by mouth See admin instructions. Take three capsules (2001 mg) by mouth three times daily with meals, take one capsule (667 mg) with snacks)  . cinacalcet (SENSIPAR) 30 MG tablet Take 30 mg by mouth See admin instructions. Take one tablet (30 mg) by mouth every Tuesday, Thursday,  Saturday after dialysis  . Famotidine 20 MG CHEW Chew 20 mg by mouth daily as needed (severe heartburn/indigestion).   . insulin aspart (NOVOLOG) 100 UNIT/ML injection Inject 0-9 Units into the skin 3 (three) times daily with meals. (Patient taking differently: Inject 3 Units into the skin 3 (three) times daily with meals. )  . insulin glargine (LANTUS) 100 UNIT/ML injection Inject 0.22 mLs (22 Units total) into the skin at bedtime. (Patient taking differently: Inject 15 Units into the skin at bedtime. )  . metoprolol succinate (TOPROL-XL) 25  MG 24 hr tablet Take 25 mg by mouth at bedtime.  . multivitamin (RENA-VIT) TABS tablet Take 1 tablet by mouth at bedtime. (Patient taking differently: Take 1 tablet by mouth daily after lunch. )  . ondansetron (ZOFRAN ODT) 4 MG disintegrating tablet Take 1 tablet (4 mg total) by mouth every 8 (eight) hours as needed for nausea or vomiting.  . promethazine (PHENERGAN) 25 MG tablet Take 1 tablet (25 mg total) by mouth every 6 (six) hours as needed for nausea or vomiting.     Allergies:   Patient has no known allergies.   Social History   Tobacco Use  . Smoking status: Never Smoker  . Smokeless tobacco: Never Used  Substance Use Topics  . Alcohol use: No    Alcohol/week: 0.0 standard drinks  . Drug use: No     Family Hx: The patient's family history includes Cancer in her maternal aunt and maternal uncle; Diabetes in her maternal grandmother; Stroke in her maternal grandmother.  ROS:   Please see the history of present illness.    Review of Systems  Constitutional: Negative.   Respiratory: Positive for cough, shortness of breath and wheezing. Negative for sputum production.   Cardiovascular: Negative.   Neurological: Negative.     All other systems reviewed and are negative.   Labs/Other Tests and Data Reviewed:    Recent Labs: 04/28/2019: ALT 23; BUN 25; Creatinine, Ser 6.47; Hemoglobin 12.4; Platelets 285; Potassium 4.2; Sodium 136   Recent Lipid Panel Lab Results  Component Value Date/Time   CHOL 139 04/15/2014 05:40 AM   TRIG 218 (H) 04/15/2014 05:40 AM   HDL 48 04/15/2014 05:40 AM   CHOLHDL 2.9 04/15/2014 05:40 AM   LDLCALC 47 04/15/2014 05:40 AM    Wt Readings from Last 3 Encounters:  02/06/19 128 lb (58.1 kg)  12/19/18 130 lb (59 kg)  10/29/18 130 lb (59 kg)     Exam:    Vital Signs:  LMP 04/26/2019     Physical Exam  Constitutional: She is oriented to person, place, and time and well-developed, well-nourished, and in no distress. No distress.    Pulmonary/Chest: No respiratory distress.  Occasional taking a deep breath when talking  Neurological: She is alert and oriented to person, place, and time.  Psychiatric: Mood, memory, affect and judgment normal.    ASSESSMENT & PLAN:     1. Cough  Hacking cough after having fluid overload, she did have an additional dialysis treatment today  Previous history of pneumonia, will send for a chest xray and treat empirically - amoxicillin (AMOXIL) 500 MG tablet; Take 1 tablet (500 mg total) by mouth 2 (two) times daily.  Dispense: 20 tablet; Refill: 0 - DG Chest 2 View; Future  2. DOE (dyspnea on exertion)  Concerning for pneumonia, this was a virtual visit unable to auscultate however when speaking at times has to take a deep breath - amoxicillin (AMOXIL) 500 MG tablet; Take  1 tablet (500 mg total) by mouth 2 (two) times daily.  Dispense: 20 tablet; Refill: 0 - DG Chest 2 View; Future   COVID-19 Education: The signs and symptoms of COVID-19 were discussed with the patient and how to seek care for testing (follow up with PCP or arrange E-visit).  The importance of social distancing was discussed today.  Patient Risk:   After full review of this patients clinical status, I feel that they are at least moderate risk at this time.  Time:   Today, I have spent 7 minutes/ seconds with the patient with telehealth technology discussing above diagnoses.     Medication Adjustments/Labs and Tests Ordered: Current medicines are reviewed at length with the patient today.  Concerns regarding medicines are outlined above.   Tests Ordered: Orders Placed This Encounter  Procedures  . DG Chest 2 View    Medication Changes: Meds ordered this encounter  Medications  . amoxicillin (AMOXIL) 500 MG tablet    Sig: Take 1 tablet (500 mg total) by mouth 2 (two) times daily.    Dispense:  20 tablet    Refill:  0    Disposition:  Follow up prn  Signed, Minette Brine, FNP

## 2019-05-22 ENCOUNTER — Ambulatory Visit
Admission: RE | Admit: 2019-05-22 | Discharge: 2019-05-22 | Disposition: A | Discharge status: Home / Self Care | Payer: Medicaid Other | Source: Ambulatory Visit | Attending: Nurse Practitioner | Admitting: Nurse Practitioner

## 2019-05-22 DIAGNOSIS — R0609 Other forms of dyspnea: Secondary | ICD-10-CM

## 2019-05-22 DIAGNOSIS — R05 Cough: Secondary | ICD-10-CM

## 2019-05-22 DIAGNOSIS — R059 Cough, unspecified: Secondary | ICD-10-CM

## 2019-08-18 DIAGNOSIS — Z886 Allergy status to analgesic agent status: Secondary | ICD-10-CM | POA: Insufficient documentation

## 2020-04-25 ENCOUNTER — Ambulatory Visit (INDEPENDENT_AMBULATORY_CARE_PROVIDER_SITE_OTHER): Payer: Medicaid Other | Admitting: Physician Assistant

## 2020-04-25 ENCOUNTER — Other Ambulatory Visit: Payer: Self-pay

## 2020-04-25 VITALS — BP 192/98 | HR 95 | Temp 98.3°F | Resp 20 | Ht 61.0 in | Wt 135.5 lb

## 2020-04-25 DIAGNOSIS — N186 End stage renal disease: Secondary | ICD-10-CM | POA: Diagnosis not present

## 2020-04-25 NOTE — H&P (View-Only) (Signed)
Established Dialysis Access   History of Present Illness   [name] is a 29 y.o. ([birth year]) female who presents for re-evaluation of permanent access.  Current left arm AV fistula was created 3 years ago at [facility].  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  About a year ago she required balloon angioplasty of outflow vein at [facility].  She also has a small wound at the aneurysmal area closer to her shoulder.  Patient however believes this is improving and the dialysis center is doing her best to avoid sticking in this area.  She dialyzes on a Tuesday Thursday and Saturday under the care of Dr. Jimmy Footman at [facility].  The patient's PMH, PSH, SH, and FamHx were reviewed and are unchanged from prior visit.  Current Outpatient Medications  Medication Sig Dispense Refill  . acetaminophen (TYLENOL) 500 MG tablet Take 1,000 mg by mouth every 6 (six) hours as needed for headache (pain).    Marland Kitchen amLODipine (NORVASC) 5 MG tablet One bid (Patient taking differently: Take 5 mg by mouth at bedtime. ) 60 tablet 0  . cinacalcet (SENSIPAR) 30 MG tablet Take 30 mg by mouth See admin instructions. Take one tablet (30 mg) by mouth every Tuesday, Thursday, Saturday after dialysis    . ergocalciferol (VITAMIN D2) 1.25 MG (50000 UT) capsule Take by mouth.    . Famotidine 20 MG CHEW Chew 20 mg by mouth daily as needed (severe heartburn/indigestion).     . insulin aspart (NOVOLOG) 100 UNIT/ML injection Inject 0-9 Units into the skin 3 (three) times daily with meals. (Patient taking differently: Inject 3 Units into the skin 3 (three) times daily with meals. ) 10 mL 11  . insulin glargine (LANTUS) 100 UNIT/ML injection Inject 0.22 mLs (22 Units total) into the skin at bedtime. (Patient taking differently: Inject 15 Units into the skin at bedtime. ) 10 mL 0  . Insulin Pen Needle (B-D ULTRAFINE III SHORT PEN)  31G X 8 MM MISC USE WITH INSULIN FOUR TIMES DAILY    . metoprolol succinate (TOPROL-XL) 25 MG 24 hr tablet Take 25 mg by mouth at bedtime.    . multivitamin (RENA-VIT) TABS tablet Take 1 tablet by mouth at bedtime. (Patient taking differently: Take 1 tablet by mouth daily after lunch. ) 30 tablet 0  . norethindrone (MICRONOR,CAMILA,ERRIN) 0.35 MG tablet Take 1 tablet (0.35 mg total) by mouth daily. 1 Package 11  . ondansetron (ZOFRAN ODT) 4 MG disintegrating tablet Take 1 tablet (4 mg total) by mouth every 8 (eight) hours as needed for nausea or vomiting. 10 tablet 0  . promethazine (PHENERGAN) 25 MG tablet Take 1 tablet (25 mg total) by mouth every 6 (six) hours as needed for nausea or vomiting. 12 tablet 0  . sevelamer carbonate (RENVELA) 800 MG tablet Take by mouth.     No current facility-administered medications for this visit.    REVIEW OF SYSTEMS (negative unless checked):   Cardiac:  []  Chest pain or chest pressure? []  Shortness of breath upon activity? []  Shortness of breath when lying flat? []  Irregular heart rhythm?  Vascular:  []  Pain in calf, thigh, or hip brought on by walking? []  Pain in feet at night that wakes you up from your sleep? []  Blood clot in your veins? []  Leg swelling?  Pulmonary:  []  Oxygen at home? []  Productive cough? []  Wheezing?  Neurologic:  []   Sudden weakness in arms or legs? []  Sudden numbness in arms or legs? []  Sudden onset of difficult speaking or slurred speech? []  Temporary loss of vision in one eye? []  Problems with dizziness?  Gastrointestinal:  []  Blood in stool? []  Vomited blood?  Genitourinary:  []  Burning when urinating? []  Blood in urine?  Psychiatric:  []  Major depression  Hematologic:  []  Bleeding problems? []  Problems with blood clotting?  Dermatologic:  []  Rashes or ulcers?  Constitutional:  []  Fever or chills?  Ear/Nose/Throat:  []  Change in hearing? []  Nose bleeds? []  Sore throat?  Musculoskeletal:   []  Back pain? []  Joint pain? []  Muscle pain?   Physical Examination   Vitals:   04/25/20 1151  BP: (!) 192/98  Pulse: 95  Resp: 20  Temp: 98.3 F (36.8 C)  TempSrc: Temporal  SpO2: 100%  Weight: 135 lb 8 oz (61.5 kg)  Height: 5\' 1"  (1.549 m)   Body mass index is 25.6 kg/m.  General:  WDWN in NAD; vital signs documented above Gait: Not observed HENT: WNL, normocephalic Pulmonary: normal non-labored breathing , without Rales, rhonchi,  wheezing Cardiac: regular HR Abdomen: soft, NT, no masses Skin: without rashes Vascular Exam/Pulses: Palpable L radial pulse Extremities: without ischemic changes, without Gangrene , without cellulitis; without open wounds;  Musculoskeletal: no muscle wasting or atrophy  Neurologic: A&O X 3;  No focal weakness or paresthesias are detected Psychiatric:  The pt has Normal affect.     Medical Decision Making   [name] is a 29 y.o. female who presents with ESRD requiring hemodialysis.    Patent left arm AV fistula with palpable thrill and no signs or symptoms of steal  Patient believes aneurysmal areas are increasing in size; [facility] performed outflow vein balloon angioplasty last year and there is concern for recurrent narrowing  There is also a small superficial wound of the proximalmost aneurysmal area; patient believes this area is improving; plication of this area was offered however patient would like to wait another couple weeks to see if this area will continue to improve  We will however proceed with left arm fistulogram with possible intervention for suspected recurrent outflow narrowing.  This procedure will be performed on a nondialysis day in the next 1 to 2 weeks at which time we can also re-evaluate her wound to see we should proceed with plication Risk, benefits, and alternatives to access surgery were discussed.   The patient agrees to proceed with the fistulogram.   Dagoberto Ligas PA-C [facility] [place] Office: [phone]  Clinic MD: Donzetta Matters

## 2020-04-25 NOTE — H&P (View-Only) (Signed)
Established Dialysis Access   History of Present Illness   [name] is a 29 y.o. (1991/06/10) female who presents for re-evaluation of permanent access.  Current left arm AV fistula was created 3 years ago at [facility].  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  About a year ago she required balloon angioplasty of outflow vein at [facility].  She also has a small wound at the aneurysmal area closer to her shoulder.  Patient however believes this is improving and the dialysis center is doing her best to avoid sticking in this area.  She dialyzes on a Tuesday Thursday and Saturday under the care of Dr. Jimmy Footman at [facility].  The patient's PMH, PSH, SH, and FamHx were reviewed and are unchanged from prior visit.  Current Outpatient Medications  Medication Sig Dispense Refill  . acetaminophen (TYLENOL) 500 MG tablet Take 1,000 mg by mouth every 6 (six) hours as needed for headache (pain).    Marland Kitchen amLODipine (NORVASC) 5 MG tablet One bid (Patient taking differently: Take 5 mg by mouth at bedtime. ) 60 tablet 0  . cinacalcet (SENSIPAR) 30 MG tablet Take 30 mg by mouth See admin instructions. Take one tablet (30 mg) by mouth every Tuesday, Thursday, Saturday after dialysis    . ergocalciferol (VITAMIN D2) 1.25 MG (50000 UT) capsule Take by mouth.    . Famotidine 20 MG CHEW Chew 20 mg by mouth daily as needed (severe heartburn/indigestion).     . insulin aspart (NOVOLOG) 100 UNIT/ML injection Inject 0-9 Units into the skin 3 (three) times daily with meals. (Patient taking differently: Inject 3 Units into the skin 3 (three) times daily with meals. ) 10 mL 11  . insulin glargine (LANTUS) 100 UNIT/ML injection Inject 0.22 mLs (22 Units total) into the skin at bedtime. (Patient taking differently: Inject 15 Units into the skin at bedtime. ) 10 mL 0  . Insulin Pen Needle (B-D ULTRAFINE III SHORT PEN)  31G X 8 MM MISC USE WITH INSULIN FOUR TIMES DAILY    . metoprolol succinate (TOPROL-XL) 25 MG 24 hr tablet Take 25 mg by mouth at bedtime.    . multivitamin (RENA-VIT) TABS tablet Take 1 tablet by mouth at bedtime. (Patient taking differently: Take 1 tablet by mouth daily after lunch. ) 30 tablet 0  . norethindrone (MICRONOR,CAMILA,ERRIN) 0.35 MG tablet Take 1 tablet (0.35 mg total) by mouth daily. 1 Package 11  . ondansetron (ZOFRAN ODT) 4 MG disintegrating tablet Take 1 tablet (4 mg total) by mouth every 8 (eight) hours as needed for nausea or vomiting. 10 tablet 0  . promethazine (PHENERGAN) 25 MG tablet Take 1 tablet (25 mg total) by mouth every 6 (six) hours as needed for nausea or vomiting. 12 tablet 0  . sevelamer carbonate (RENVELA) 800 MG tablet Take by mouth.     No current facility-administered medications for this visit.    REVIEW OF SYSTEMS (negative unless checked):   Cardiac:  []  Chest pain or chest pressure? []  Shortness of breath upon activity? []  Shortness of breath when lying flat? []  Irregular heart rhythm?  Vascular:  []  Pain in calf, thigh, or hip brought on by walking? []  Pain in feet at night that wakes you up from your sleep? []  Blood clot in your veins? []  Leg swelling?  Pulmonary:  []  Oxygen at home? []  Productive cough? []  Wheezing?  Neurologic:  []   Sudden weakness in arms or legs? []  Sudden numbness in arms or legs? []  Sudden onset of difficult speaking or slurred speech? []  Temporary loss of vision in one eye? []  Problems with dizziness?  Gastrointestinal:  []  Blood in stool? []  Vomited blood?  Genitourinary:  []  Burning when urinating? []  Blood in urine?  Psychiatric:  []  Major depression  Hematologic:  []  Bleeding problems? []  Problems with blood clotting?  Dermatologic:  []  Rashes or ulcers?  Constitutional:  []  Fever or chills?  Ear/Nose/Throat:  []  Change in hearing? []  Nose bleeds? []  Sore throat?  Musculoskeletal:    []  Back pain? []  Joint pain? []  Muscle pain?   Physical Examination   Vitals:   04/25/20 1151  BP: (!) 192/98  Pulse: 95  Resp: 20  Temp: 98.3 F (36.8 C)  TempSrc: Temporal  SpO2: 100%  Weight: 135 lb 8 oz (61.5 kg)  Height: 5\' 1"  (1.549 m)   Body mass index is 25.6 kg/m.  General:  WDWN in NAD; vital signs documented above Gait: Not observed HENT: WNL, normocephalic Pulmonary: normal non-labored breathing , without Rales, rhonchi,  wheezing Cardiac: regular HR Abdomen: soft, NT, no masses Skin: without rashes Vascular Exam/Pulses: Palpable L radial pulse Extremities: without ischemic changes, without Gangrene , without cellulitis; without open wounds;  Musculoskeletal: no muscle wasting or atrophy  Neurologic: A&O X 3;  No focal weakness or paresthesias are detected Psychiatric:  The pt has Normal affect.     Medical Decision Making   [name] is a 29 y.o. female who presents with ESRD requiring hemodialysis.    Patent left arm AV fistula with palpable thrill and no signs or symptoms of steal  Patient believes aneurysmal areas are increasing in size; [facility] performed outflow vein balloon angioplasty last year and there is concern for recurrent narrowing  There is also a small superficial wound of the proximalmost aneurysmal area; patient believes this area is improving; plication of this area was offered however patient would like to wait another couple weeks to see if this area will continue to improve  We will however proceed with left arm fistulogram with possible intervention for suspected recurrent outflow narrowing.  This procedure will be performed on a nondialysis day in the next 1 to 2 weeks at which time we can also re-evaluate her wound to see we should proceed with plication Risk, benefits, and alternatives to access surgery were discussed.   The patient agrees to proceed with the fistulogram.   Dagoberto Ligas PA-C [facility] [place] Office: [phone]  Clinic MD: Donzetta Matters

## 2020-04-25 NOTE — Progress Notes (Signed)
Established Dialysis Access   History of Present Illness   [name] is a 29 y.o. ([birth year]) female who presents for re-evaluation of permanent access.  Current left arm AV fistula was created 3 years ago at [facility].  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  About a year ago she required balloon angioplasty of outflow [facility] at [facility].  She also has a small wound at the aneurysmal area closer to her shoulder.  Patient however believes this is improving and the dialysis center is doing her best to avoid sticking in this area.  She dialyzes on a Tuesday Thursday and Saturday under the care of Dr. Jimmy Footman at [facility].  The patient's PMH, PSH, SH, and FamHx were reviewed and are unchanged from prior visit.  Current Outpatient Medications  Medication Sig Dispense Refill   acetaminophen (TYLENOL) 500 MG tablet Take 1,000 mg by mouth every 6 (six) hours as needed for headache (pain).     amLODipine (NORVASC) 5 MG tablet One bid (Patient taking differently: Take 5 mg by mouth at bedtime. ) 60 tablet 0   cinacalcet (SENSIPAR) 30 MG tablet Take 30 mg by mouth See admin instructions. Take one tablet (30 mg) by mouth every Tuesday, Thursday, Saturday after dialysis     ergocalciferol (VITAMIN D2) 1.25 MG (50000 UT) capsule Take by mouth.     Famotidine 20 MG CHEW Chew 20 mg by mouth daily as needed (severe heartburn/indigestion).      insulin aspart (NOVOLOG) 100 UNIT/ML injection Inject 0-9 Units into the skin 3 (three) times daily with meals. (Patient taking differently: Inject 3 Units into the skin 3 (three) times daily with meals. ) 10 mL 11   insulin glargine (LANTUS) 100 UNIT/ML injection Inject 0.22 mLs (22 Units total) into the skin at bedtime. (Patient taking differently: Inject 15 Units into the skin at bedtime. ) 10 mL 0   Insulin Pen Needle (B-D ULTRAFINE III SHORT PEN)  31G X 8 MM MISC USE WITH INSULIN FOUR TIMES DAILY     metoprolol succinate (TOPROL-XL) 25 MG 24 hr tablet Take 25 mg by mouth at bedtime.     multivitamin (RENA-VIT) TABS tablet Take 1 tablet by mouth at bedtime. (Patient taking differently: Take 1 tablet by mouth daily after lunch. ) 30 tablet 0   norethindrone (MICRONOR,CAMILA,ERRIN) 0.35 MG tablet Take 1 tablet (0.35 mg total) by mouth daily. 1 Package 11   ondansetron (ZOFRAN ODT) 4 MG disintegrating tablet Take 1 tablet (4 mg total) by mouth every 8 (eight) hours as needed for nausea or vomiting. 10 tablet 0   promethazine (PHENERGAN) 25 MG tablet Take 1 tablet (25 mg total) by mouth every 6 (six) hours as needed for nausea or vomiting. 12 tablet 0   sevelamer carbonate (RENVELA) 800 MG tablet Take by mouth.     No current facility-administered medications for this visit.    REVIEW OF SYSTEMS (negative unless checked):   Cardiac:  []  Chest pain or chest pressure? []  Shortness of breath upon activity? []  Shortness of breath when lying flat? []  Irregular heart rhythm?  Vascular:  []  Pain in calf, thigh, or hip brought on by walking? []  Pain in feet at night that wakes you up from your sleep? []  Blood clot in your veins? []  Leg swelling?  Pulmonary:  []  Oxygen at home? []  Productive cough? []  Wheezing?  Neurologic:  []   Sudden weakness in arms or legs? []  Sudden numbness in arms or legs? []  Sudden onset of difficult speaking or slurred speech? []  Temporary loss of vision in one eye? []  Problems with dizziness?  Gastrointestinal:  []  Blood in stool? []  Vomited blood?  Genitourinary:  []  Burning when urinating? []  Blood in urine?  Psychiatric:  []  Major depression  Hematologic:  []  Bleeding problems? []  Problems with blood clotting?  Dermatologic:  []  Rashes or ulcers?  Constitutional:  []  Fever or chills?  Ear/Nose/Throat:  []  Change in hearing? []  Nose bleeds? []  Sore throat?  Musculoskeletal:    []  Back pain? []  Joint pain? []  Muscle pain?   Physical Examination   Vitals:   04/25/20 1151  BP: (!) 192/98  Pulse: 95  Resp: 20  Temp: 98.3 F (36.8 C)  TempSrc: Temporal  SpO2: 100%  Weight: 135 lb 8 oz (61.5 kg)  Height: 5\' 1"  (1.549 m)   Body mass index is 25.6 kg/m.  General:  WDWN in NAD; vital signs documented above Gait: Not observed HENT: WNL, normocephalic Pulmonary: normal non-labored breathing , without Rales, rhonchi,  wheezing Cardiac: regular HR Abdomen: soft, NT, no masses Skin: without rashes Vascular Exam/Pulses: Palpable L radial pulse Extremities: without ischemic changes, without Gangrene , without cellulitis; without open wounds;  Musculoskeletal: no muscle wasting or atrophy  Neurologic: A&O X 3;  No focal weakness or paresthesias are detected Psychiatric:  The pt has Normal affect.     Medical Decision Making   [name] is a 29 y.o. female who presents with ESRD requiring hemodialysis.    Patent left arm AV fistula with palpable thrill and no signs or symptoms of steal  Patient believes aneurysmal areas are increasing in size; [facility] performed outflow vein balloon angioplasty last year and there is concern for recurrent narrowing  There is also a small superficial wound of the proximalmost aneurysmal area; patient believes this area is improving; plication of this area was offered however patient would like to wait another couple weeks to see if this area will continue to improve  We will however proceed with left arm fistulogram with possible intervention for suspected recurrent outflow narrowing.  This procedure will be performed on a nondialysis day in the next 1 to 2 weeks at which time we can also re-evaluate her wound to see we should proceed with plication Risk, benefits, and alternatives to access surgery were discussed.   The patient agrees to proceed with the fistulogram.   Dagoberto Ligas PA-C [facility] [place] Office: [phone]  Clinic MD: Donzetta Matters

## 2020-04-28 ENCOUNTER — Other Ambulatory Visit (HOSPITAL_COMMUNITY)
Admission: RE | Admit: 2020-04-28 | Discharge: 2020-04-28 | Disposition: A | Discharge status: Home / Self Care | Payer: Medicaid Other | Source: Ambulatory Visit | Attending: Vascular Surgery | Admitting: Vascular Surgery

## 2020-04-28 DIAGNOSIS — Z01812 Encounter for preprocedural laboratory examination: Secondary | ICD-10-CM | POA: Diagnosis not present

## 2020-04-28 DIAGNOSIS — Z20822 Contact with and (suspected) exposure to covid-19: Secondary | ICD-10-CM | POA: Diagnosis not present

## 2020-04-28 LAB — SARS CORONAVIRUS 2 (TAT 6-24 HRS): SARS Coronavirus 2: NEGATIVE

## 2020-04-30 ENCOUNTER — Encounter (HOSPITAL_COMMUNITY): Payer: Self-pay | Admitting: Vascular Surgery

## 2020-04-30 ENCOUNTER — Ambulatory Visit (HOSPITAL_COMMUNITY)
Admission: RE | Admit: 2020-04-30 | Discharge: 2020-04-30 | Disposition: A | Discharge status: Home / Self Care | Payer: Medicaid Other | Attending: Vascular Surgery | Admitting: Vascular Surgery

## 2020-04-30 ENCOUNTER — Encounter (HOSPITAL_COMMUNITY)
Admission: RE | Disposition: A | Discharge status: Home / Self Care | Payer: Self-pay | Source: Home / Self Care | Attending: Vascular Surgery

## 2020-04-30 ENCOUNTER — Other Ambulatory Visit: Payer: Self-pay

## 2020-04-30 DIAGNOSIS — Y832 Surgical operation with anastomosis, bypass or graft as the cause of abnormal reaction of the patient, or of later complication, without mention of misadventure at the time of the procedure: Secondary | ICD-10-CM | POA: Insufficient documentation

## 2020-04-30 DIAGNOSIS — T82858A Stenosis of vascular prosthetic devices, implants and grafts, initial encounter: Secondary | ICD-10-CM | POA: Diagnosis not present

## 2020-04-30 DIAGNOSIS — Z794 Long term (current) use of insulin: Secondary | ICD-10-CM | POA: Insufficient documentation

## 2020-04-30 DIAGNOSIS — Z79899 Other long term (current) drug therapy: Secondary | ICD-10-CM | POA: Insufficient documentation

## 2020-04-30 DIAGNOSIS — Z992 Dependence on renal dialysis: Secondary | ICD-10-CM | POA: Insufficient documentation

## 2020-04-30 DIAGNOSIS — N186 End stage renal disease: Secondary | ICD-10-CM | POA: Diagnosis not present

## 2020-04-30 DIAGNOSIS — N185 Chronic kidney disease, stage 5: Secondary | ICD-10-CM

## 2020-04-30 HISTORY — PX: PERIPHERAL VASCULAR BALLOON ANGIOPLASTY: CATH118281

## 2020-04-30 LAB — POCT I-STAT, CHEM 8
BUN: 31 mg/dL — ABNORMAL HIGH (ref 6–20)
Calcium, Ion: 1.01 mmol/L — ABNORMAL LOW (ref 1.15–1.40)
Chloride: 93 mmol/L — ABNORMAL LOW (ref 98–111)
Creatinine, Ser: 8.4 mg/dL — ABNORMAL HIGH (ref 0.44–1.00)
Glucose, Bld: 293 mg/dL — ABNORMAL HIGH (ref 70–99)
HCT: 37 % (ref 36.0–46.0)
Hemoglobin: 12.6 g/dL (ref 12.0–15.0)
Potassium: 4.5 mmol/L (ref 3.5–5.1)
Sodium: 134 mmol/L — ABNORMAL LOW (ref 135–145)
TCO2: 29 mmol/L (ref 22–32)

## 2020-04-30 LAB — HCG, SERUM, QUALITATIVE: Preg, Serum: NEGATIVE

## 2020-04-30 SURGERY — PERIPHERAL VASCULAR BALLOON ANGIOPLASTY
Anesthesia: LOCAL | Laterality: Left

## 2020-04-30 MED ORDER — SODIUM CHLORIDE 0.9 % IV SOLN: 250.0000 mL | INTRAVENOUS | Status: DC | PRN

## 2020-04-30 MED ORDER — IODIXANOL 320 MG/ML IV SOLN
INTRAVENOUS | Status: DC | PRN
  Administered 2020-04-30: 35 mL via INTRAVENOUS

## 2020-04-30 MED ORDER — SODIUM CHLORIDE 0.9% FLUSH: 3.0000 mL | Freq: Two times a day (BID) | INTRAVENOUS | Status: DC

## 2020-04-30 MED ORDER — SODIUM CHLORIDE 0.9% FLUSH: 3.0000 mL | INTRAVENOUS | Status: DC | PRN

## 2020-04-30 MED ORDER — LIDOCAINE HCL (PF) 1 % IJ SOLN
INTRAMUSCULAR | Status: DC | PRN
  Administered 2020-04-30: 2 mL via INTRADERMAL

## 2020-04-30 MED ORDER — LIDOCAINE HCL (PF) 1 % IJ SOLN
INTRAMUSCULAR | Status: AC
  Filled 2020-04-30: qty 30

## 2020-04-30 MED ORDER — HEPARIN (PORCINE) IN NACL 1000-0.9 UT/500ML-% IV SOLN
INTRAVENOUS | Status: AC
  Filled 2020-04-30: qty 500

## 2020-04-30 MED ORDER — HEPARIN (PORCINE) IN NACL 1000-0.9 UT/500ML-% IV SOLN
INTRAVENOUS | Status: DC | PRN
  Administered 2020-04-30: 500 mL

## 2020-04-30 SURGICAL SUPPLY — 11 items
BALLN LUTONIX AV 8X40X75 (BALLOONS) ×2
BALLOON LUTONIX AV 8X40X75 (BALLOONS) ×1 IMPLANT
COVER DOME SNAP 22 D (MISCELLANEOUS) ×2 IMPLANT
KIT ENCORE 26 ADVANTAGE (KITS) ×2 IMPLANT
KIT MICROPUNCTURE NIT STIFF (SHEATH) ×2 IMPLANT
PROTECTION STATION PRESSURIZED (MISCELLANEOUS) ×2
SHEATH PINNACLE R/O II 6F 4CM (SHEATH) ×2 IMPLANT
STATION PROTECTION PRESSURIZED (MISCELLANEOUS) ×1 IMPLANT
TRAY PV CATH (CUSTOM PROCEDURE TRAY) ×2 IMPLANT
TUBING CIL FLEX 10 FLL-RA (TUBING) ×2 IMPLANT
WIRE BENTSON .035X145CM (WIRE) ×2 IMPLANT

## 2020-04-30 NOTE — Op Note (Signed)
DATE OF SERVICE: 04/30/2020  PATIENT:  [name]  29 y.o. 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  TOURNIQUET:  * No tourniquets in log *  PLAN OF CARE: Discharge to home after PACU  PATIENT DISPOSITION:  PACU - hemodynamically stable.   Delay start of Pharmacological VTE agent (>24hrs) due to surgical blood loss or risk of bleeding: no  INDICATION FOR PROCEDURE: [name] is a 28 y.o. 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 After careful discussion of risks, benefits, and alternatives the patient was offered fistulagram to evaluate for possible outflow stenosis. The patient understood and wished to proceed.  DESCRIPTION OF PROCEDURE: After identification of the patient in the pre-operative holding area, the patient was transferred to the operating room. The patient was positioned supine on the operating room table. Anesthesia was induced. The left arm was prepped and draped in standard fashion. A surgical pause was performed confirming correct patient, procedure, and operative location.  After infiltration of 1% lidocaine over the proximal AV fistula, micropuncture technique was used to access the fistula.  A 4 French sheath was advanced into the fistula over wire.  Fistulography was performed.  This revealed no evidence of central venous stenosis.  There was 50%-70% edge in-stent stenosis and a previously placed axillary vein stent.  There are no other areas of  hemodynamically significant stenosis.  Decision was made to intervene.  An 035 Bentson wire was advanced into the central veins.  Over the wire a 8 x 40 mm Lutonix drug-coated balloon was advanced over the area of central stenosis.  This was angioplastied with good result.  The balloon was withdrawn and again angioplasty performed on the more peripheral aspect of in-stent stenosis.  Good result was achieved.  Follow-up fistulogram revealed no residual stenosis.  The sheath was removed.  A pursestring suture of 4-0 Monocryl was placed around the sheath site.  This was hemostatic.  A sterile dressing was applied.  Upon completion of the case instrument and sharps counts were confirmed correct. The patient was transferred to the PACU in good condition. I was present for all portions of the procedure.  [name]. Stanford Breed, MD [facility] Phone Number: [phone] 04/30/2020 11:37 AM

## 2020-04-30 NOTE — Progress Notes (Signed)
Discharge instructions reviewed with patient.  Verbalized understanding.

## 2020-04-30 NOTE — Interval H&P Note (Signed)
History and Physical Interval Note:  04/30/2020 8:14 AM  [name]  has presented today for surgery, with the diagnosis of end stage renal.  The various methods of treatment have been discussed with the patient and family. After consideration of risks, benefits and other options for treatment, the patient has consented to  Procedure(s): A/V FISTULAGRAM (Left) as a surgical intervention.  The patient's history has been reviewed, patient examined, no change in status, stable for surgery.  I have reviewed the patient's chart and labs.  Questions were answered to the patient's satisfaction.     Cherre Robins

## 2020-04-30 NOTE — Discharge Instructions (Signed)

## 2020-05-02 ENCOUNTER — Other Ambulatory Visit (HOSPITAL_COMMUNITY)
Admission: RE | Admit: 2020-05-02 | Discharge: 2020-05-02 | Disposition: A | Discharge status: Home / Self Care | Payer: Medicaid Other | Source: Ambulatory Visit | Attending: Vascular Surgery | Admitting: Vascular Surgery

## 2020-05-02 ENCOUNTER — Encounter (HOSPITAL_COMMUNITY): Payer: Self-pay | Admitting: *Deleted

## 2020-05-02 ENCOUNTER — Other Ambulatory Visit: Payer: Self-pay

## 2020-05-02 DIAGNOSIS — Z20822 Contact with and (suspected) exposure to covid-19: Secondary | ICD-10-CM | POA: Diagnosis not present

## 2020-05-02 DIAGNOSIS — Z01812 Encounter for preprocedural laboratory examination: Secondary | ICD-10-CM | POA: Diagnosis present

## 2020-05-02 LAB — SARS CORONAVIRUS 2 (TAT 6-24 HRS): SARS Coronavirus 2: NEGATIVE

## 2020-05-02 NOTE — Progress Notes (Signed)
Anesthesia Chart Review: Same day workup  History of uncontrolled type 1 diabetes. In 2015 she had an episode of DKA complicated by hyperkalemia induced cardiac arrest requiring CPR and intubation. Echo at that time noted with EF of 55-60% and no wall motion abnormality. Last seen by cardiology 08/15/18, stable. Discussed history of DOE. Per note, "History of dyspnea on exertion with recent pneumonia back 06/26/2018 which resolved within several weeks.  She says she is more dyspneic on or off dialysis days probably be related to her volume status which improves when her fluid draws are increased.  A 2D echo performed back in 2015 (04/07/2014) was essentially normal." No changes were made to management.  Patient recently underwent stress testing and echocardiogram at [facility] as part of transplant evaluation.  Testing was benign.  Full results below.  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  He is followed by endocrinology at [facility].  Last A1c in Care Everywhere 8.4 on 12/12/2018.  ESRD on HD T Th Sa. Under the care of Dr. Jimmy Footman.   Will need day of surgery labs and evaluation.  EKG 04/30/20: Normal sinus rhythm. Rate 92. Possible Left atrial enlargement. RSR' or QR pattern in V1 suggests right ventricular conduction delay   Nuclear stress 12/12/18 (care everywhere): STRESS ECG RESULTS -----------------------------------------------------------   ECG Results: Nondiagnostic due to inadequate heart rate. .   INTERPRETATION ---------------------------------------------------------------   INDETERMINATE. NORMAL RESTING STUDY WITH NO WALL MOTION ABNORMALITIES AT  REST AND PEAK STRESS.  NO DOPPLER PERFORMED FOR VALVULAR REGURGITATION  NO DOPPLER PERFORMED FOR VALVULAR STENOSIS  Note: FULL TTE PERFORMED PRIOR TO STRESS ECHO  Maximum workload of 8.40 METs was achieved during exercise.  RESTING HYPERTENSION - APPROPRIATE RESPONSE  TRIVIAL PERICARDIAL EFFUSION   TTE 12/12/18 (care  everywhere): NORMAL LEFT VENTRICULAR SYSTOLIC FUNCTION WITH MILD LVH  NORMAL LA PRESSURES WITH NORMAL DIASTOLIC FUNCTION  NORMAL RIGHT VENTRICULAR SYSTOLIC FUNCTION  VALVULAR REGURGITATION: TRIVIAL AR, TRIVIAL MR, TRIVIAL PR, TRIVIAL TR  NO VALVULAR STENOSIS   Compared with prior Echo study on 10/08/2016: NO SIGNIFICANT CHANGE    TTE 04/08/14: - Left ventricle: The cavity size was normal. Wall thickness was  normal. Systolic function was normal. The estimated ejection  fraction was in the range of 55% to 60%. Left ventricular  diastolic function parameters were normal.  - Pulmonary arteries: PA peak pressure: 31 mm Hg (S).     Wynonia Musty [facility] Short Stay Center/Anesthesiology Phone [phone] 05/02/2020 11:49 AM

## 2020-05-02 NOTE — Anesthesia Preprocedure Evaluation (Addendum)
Anesthesia Evaluation  Patient identified by MRN, date of birth, ID band Patient awake    Reviewed: Patient's Chart, lab work & pertinent test results, reviewed documented beta blocker date and time   History of Anesthesia Complications (+) PONV  Airway Mallampati: II  TM Distance: >3 FB Neck ROM: Full    Dental  (+) Teeth Intact   Pulmonary    Pulmonary exam normal        Cardiovascular hypertension, Pt. on medications and Pt. on home beta blockers + DOE   Rhythm:Regular Rate:Normal     Neuro/Psych Depression CVA    GI/Hepatic negative GI ROS, Neg liver ROS,   Endo/Other  diabetes, Poorly Controlled, Type 1, Insulin Dependent  Renal/GU ESRF and DialysisRenal disease  negative genitourinary   Musculoskeletal negative musculoskeletal ROS (+)   Abdominal (+)  Abdomen: soft. Bowel sounds: normal.  Peds  Hematology  (+) anemia ,   Anesthesia Other Findings   Reproductive/Obstetrics negative OB ROS                            Anesthesia Physical Anesthesia Plan  ASA: III  Anesthesia Plan: MAC   Post-op Pain Management:    Induction:   PONV Risk Score and Plan: 3 and Dexamethasone, Ondansetron and Treatment may vary due to age or medical condition  Airway Management Planned: Simple Face Mask, Natural Airway and Nasal Cannula  Additional Equipment: None  Intra-op Plan:   Post-operative Plan:   Informed Consent: I have reviewed the patients History and Physical, chart, labs and discussed the procedure including the risks, benefits and alternatives for the proposed anesthesia with the patient or authorized representative who has indicated his/her understanding and acceptance.     Dental advisory given  Plan Discussed with: CRNA  Anesthesia Plan Comments: (Lab Results      Component                Value               Date                      WBC                      9.6                  04/28/2019                HGB                      12.6                04/30/2020                HCT                      37.0                04/30/2020                MCV                      92.7                04/28/2019                PLT  285                 04/28/2019           Lab Results      Component                Value               Date                      NA                       134 (L)             04/30/2020                K                        4.5                 04/30/2020                CO2                      23                  04/28/2019                GLUCOSE                  293 (H)             04/30/2020                BUN                      31 (H)              04/30/2020                CREATININE               8.40 (H)            04/30/2020                CALCIUM                  11.1 (H)            04/28/2019                GFRNONAA                 8 (L)               04/28/2019                GFRAA                    9 (L)               04/28/2019           PAT note by Karoline Caldwell, PA-C: History of uncontrolled type 1 diabetes. In 2015 she had an episode of DKA complicated by hyperkalemia induced cardiac arrest requiring CPR and intubation. Echo at that time noted with EF of 55-60% and no wall motion abnormality. Last seen by cardiology 08/15/18, stable. Discussed history of DOE. Per note, "History of  dyspnea on exertion with recent pneumonia back 06/26/2018 which resolved within several weeks.  She says she is more dyspneic on or off dialysis days probably be related to her volume status which improves when her fluid draws are increased.  A 2D echo performed back in 2015 (04/07/2014) was essentially normal." No changes were made to management.  Patient recently underwent stress testing and echocardiogram at [facility] as part of transplant evaluation.  Testing was benign.  Full results below.  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  He  is followed by endocrinology at [facility].  Last A1c in Care Everywhere 8.4 on 12/12/2018.  ESRD on HD T Th Sa. Under the care of Dr. Jimmy Footman.   Will need day of surgery labs and evaluation.  EKG 04/30/20: Normal sinus rhythm. Rate 92. Possible Left atrial enlargement. RSR' or QR pattern in V1 suggests right ventricular conduction delay   Nuclear stress 12/12/18 (care everywhere): STRESS ECG RESULTS -----------------------------------------------------------   ECG Results: Nondiagnostic due to inadequate heart rate. .   INTERPRETATION ---------------------------------------------------------------   INDETERMINATE. NORMAL RESTING STUDY WITH NO WALL MOTION ABNORMALITIES AT  REST AND PEAK STRESS.  NO DOPPLER PERFORMED FOR VALVULAR REGURGITATION  NO DOPPLER PERFORMED FOR VALVULAR STENOSIS  Note: FULL TTE PERFORMED PRIOR TO STRESS ECHO  Maximum workload of 8.40 METs was achieved during exercise.  RESTING HYPERTENSION - APPROPRIATE RESPONSE  TRIVIAL PERICARDIAL EFFUSION   TTE 12/12/18 (care everywhere): NORMAL LEFT VENTRICULAR SYSTOLIC FUNCTION WITH MILD LVH  NORMAL LA PRESSURES WITH NORMAL DIASTOLIC FUNCTION  NORMAL RIGHT VENTRICULAR SYSTOLIC FUNCTION  VALVULAR REGURGITATION: TRIVIAL AR, TRIVIAL MR, TRIVIAL PR, TRIVIAL TR  NO VALVULAR STENOSIS   Compared with prior Echo study on 10/08/2016: NO SIGNIFICANT CHANGE    TTE 04/08/14: - Left ventricle: The cavity size was normal. Wall thickness was  normal. Systolic function was normal. The estimated ejection  fraction was in the range of 55% to 60%. Left ventricular  diastolic function parameters were normal.  - Pulmonary arteries: PA peak pressure: 31 mm Hg (S).    )      Anesthesia Quick Evaluation

## 2020-05-02 NOTE — Progress Notes (Signed)
PCP - Minette Brine, FNP Cardiologist - Dr Gwenlyn Found Nephrology - Dr Azzie Glatter Endocrinology - Janina Mayo, NP  Chest x-ray - 05/22/19 (2V) EKG - 04/30/20 Stress Test - 08/19/17 CE,  ECHO - 04/08/14 Cardiac Cath - n/a  Fasting Blood Sugar - 35-300s Checks Blood Sugar 3 times a day  . THE NIGHT BEFORE SURGERY, take 12 Units of Lantus Insulin.      . THE MORNING OF SURGERY, do not take Novolog Insulin unless your CBG is greater than 220 mg/dL, you may take  of your sliding scale (correction) dose of insulin.   . If your blood sugar is less than 70 mg/dL, you will need to treat for low blood sugar: o Treat a low blood sugar (less than 70 mg/dL) with  cup of clear juice (cranberry or apple), 4 glucose tablets, OR glucose gel. o Recheck blood sugar in 15 minutes after treatment (to make sure it is greater than 70 mg/dL). If your blood sugar is not greater than 70 mg/dL on recheck, call [phone] for further instructions.  Anesthesia review: Yes  STOP now taking any Aspirin (unless otherwise instructed by your surgeon), Aleve, Naproxen, Ibuprofen, Motrin, Advil, Goody's, BC's, all herbal medications, fish oil, and all vitamins.   Coronavirus Screening Covid test is scheduled on 05/02/20 @ 1155 Do you have any of the following symptoms:  Cough yes/no: No Fever (>100.31F)  yes/no: No Runny nose yes/no: No Sore throat yes/no: No Difficulty breathing/shortness of breath  yes/no: No  Have you traveled in the last 14 days and where? yes/no: No  Patient verbalized understanding of instructions that were given via phone.

## 2020-05-05 ENCOUNTER — Encounter (HOSPITAL_COMMUNITY)
Admission: RE | Disposition: A | Discharge status: Home / Self Care | Payer: Self-pay | Source: Home / Self Care | Attending: Vascular Surgery

## 2020-05-05 ENCOUNTER — Ambulatory Visit (HOSPITAL_COMMUNITY)
Admission: RE | Admit: 2020-05-05 | Discharge: 2020-05-05 | Disposition: A | Discharge status: Home / Self Care | Payer: Medicaid Other | Attending: Vascular Surgery | Admitting: Vascular Surgery

## 2020-05-05 ENCOUNTER — Encounter (HOSPITAL_COMMUNITY): Payer: Self-pay

## 2020-05-05 ENCOUNTER — Ambulatory Visit (HOSPITAL_COMMUNITY): Payer: Medicaid Other | Admitting: Physician Assistant

## 2020-05-05 ENCOUNTER — Other Ambulatory Visit: Payer: Self-pay

## 2020-05-05 DIAGNOSIS — Z992 Dependence on renal dialysis: Secondary | ICD-10-CM | POA: Insufficient documentation

## 2020-05-05 DIAGNOSIS — Z794 Long term (current) use of insulin: Secondary | ICD-10-CM | POA: Insufficient documentation

## 2020-05-05 DIAGNOSIS — Z79899 Other long term (current) drug therapy: Secondary | ICD-10-CM | POA: Insufficient documentation

## 2020-05-05 DIAGNOSIS — N186 End stage renal disease: Secondary | ICD-10-CM | POA: Insufficient documentation

## 2020-05-05 HISTORY — PX: REVISON OF ARTERIOVENOUS FISTULA: SHX6074

## 2020-05-05 LAB — POCT I-STAT, CHEM 8
BUN: 32 mg/dL — ABNORMAL HIGH (ref 6–20)
Calcium, Ion: 1.08 mmol/L — ABNORMAL LOW (ref 1.15–1.40)
Chloride: 95 mmol/L — ABNORMAL LOW (ref 98–111)
Creatinine, Ser: 10.2 mg/dL — ABNORMAL HIGH (ref 0.44–1.00)
Glucose, Bld: 150 mg/dL — ABNORMAL HIGH (ref 70–99)
HCT: 41 % (ref 36.0–46.0)
Hemoglobin: 13.9 g/dL (ref 12.0–15.0)
Potassium: 4 mmol/L (ref 3.5–5.1)
Sodium: 137 mmol/L (ref 135–145)
TCO2: 27 mmol/L (ref 22–32)

## 2020-05-05 LAB — GLUCOSE, CAPILLARY
Glucose-Capillary: 138 mg/dL — ABNORMAL HIGH (ref 70–99)
Glucose-Capillary: 167 mg/dL — ABNORMAL HIGH (ref 70–99)
Glucose-Capillary: 153 mg/dL — ABNORMAL HIGH (ref 70–99)

## 2020-05-05 LAB — HCG, SERUM, QUALITATIVE: Preg, Serum: NEGATIVE

## 2020-05-05 SURGERY — REVISON OF ARTERIOVENOUS FISTULA
Anesthesia: Monitor Anesthesia Care | Site: Arm Upper | Laterality: Left

## 2020-05-05 MED ORDER — CEFAZOLIN SODIUM-DEXTROSE 2-4 GM/100ML-% IV SOLN
2.0000 g | INTRAVENOUS | Status: AC
  Administered 2020-05-05: 2 g via INTRAVENOUS
  Filled 2020-05-05: qty 100

## 2020-05-05 MED ORDER — SODIUM CHLORIDE 0.9 % IV SOLN: INTRAVENOUS | Status: DC

## 2020-05-05 MED ORDER — CHLORHEXIDINE GLUCONATE 4 % EX LIQD: 60.0000 mL | Freq: Once | CUTANEOUS | Status: DC

## 2020-05-05 MED ORDER — PROPOFOL 500 MG/50ML IV EMUL
INTRAVENOUS | Status: DC | PRN
  Administered 2020-05-05: 100 ug/kg/min via INTRAVENOUS

## 2020-05-05 MED ORDER — ACETAMINOPHEN 10 MG/ML IV SOLN: 1000.0000 mg | Freq: Once | INTRAVENOUS | Status: DC | PRN

## 2020-05-05 MED ORDER — ONDANSETRON HCL 4 MG/2ML IJ SOLN
INTRAMUSCULAR | Status: DC | PRN
  Administered 2020-05-05: 4 mg via INTRAVENOUS

## 2020-05-05 MED ORDER — GLYCOPYRROLATE PF 0.2 MG/ML IJ SOSY
PREFILLED_SYRINGE | INTRAMUSCULAR | Status: DC | PRN
  Administered 2020-05-05 (×2): .1 mg via INTRAVENOUS

## 2020-05-05 MED ORDER — PROPOFOL 10 MG/ML IV BOLUS
INTRAVENOUS | Status: DC | PRN
  Administered 2020-05-05: 40 mg via INTRAVENOUS

## 2020-05-05 MED ORDER — HEPARIN SODIUM (PORCINE) 1000 UNIT/ML IJ SOLN
INTRAMUSCULAR | Status: DC | PRN
  Administered 2020-05-05: 5000 [IU] via INTRAVENOUS

## 2020-05-05 MED ORDER — ONDANSETRON HCL 4 MG/2ML IJ SOLN: 4.0000 mg | Freq: Once | INTRAMUSCULAR | Status: DC | PRN

## 2020-05-05 MED ORDER — MIDAZOLAM HCL 2 MG/2ML IJ SOLN
INTRAMUSCULAR | Status: DC | PRN
  Administered 2020-05-05: 2 mg via INTRAVENOUS

## 2020-05-05 MED ORDER — SODIUM CHLORIDE 0.9 % IV SOLN
INTRAVENOUS | Status: AC
  Filled 2020-05-05: qty 1.2

## 2020-05-05 MED ORDER — GLYCOPYRROLATE PF 0.2 MG/ML IJ SOSY
PREFILLED_SYRINGE | INTRAMUSCULAR | Status: AC
  Filled 2020-05-05: qty 1

## 2020-05-05 MED ORDER — LIDOCAINE-EPINEPHRINE (PF) 1 %-1:200000 IJ SOLN
INTRAMUSCULAR | Status: AC
  Filled 2020-05-05: qty 30

## 2020-05-05 MED ORDER — PHENYLEPHRINE 40 MCG/ML (10ML) SYRINGE FOR IV PUSH (FOR BLOOD PRESSURE SUPPORT)
PREFILLED_SYRINGE | INTRAVENOUS | Status: AC
  Filled 2020-05-05: qty 10

## 2020-05-05 MED ORDER — DEXAMETHASONE SODIUM PHOSPHATE 10 MG/ML IJ SOLN
INTRAMUSCULAR | Status: DC | PRN
  Administered 2020-05-05: 4 mg via INTRAVENOUS

## 2020-05-05 MED ORDER — ROCURONIUM BROMIDE 10 MG/ML (PF) SYRINGE
PREFILLED_SYRINGE | INTRAVENOUS | Status: AC
  Filled 2020-05-05: qty 10

## 2020-05-05 MED ORDER — DEXAMETHASONE SODIUM PHOSPHATE 10 MG/ML IJ SOLN
INTRAMUSCULAR | Status: AC
  Filled 2020-05-05: qty 2

## 2020-05-05 MED ORDER — HEPARIN SODIUM (PORCINE) 1000 UNIT/ML IJ SOLN
INTRAMUSCULAR | Status: AC
  Filled 2020-05-05: qty 2

## 2020-05-05 MED ORDER — SODIUM CHLORIDE 0.9 % IV SOLN
INTRAVENOUS | Status: DC | PRN
  Administered 2020-05-05: 500 mL

## 2020-05-05 MED ORDER — HYDROMORPHONE HCL 1 MG/ML IJ SOLN: 0.2500 mg | INTRAMUSCULAR | Status: DC | PRN

## 2020-05-05 MED ORDER — FENTANYL CITRATE (PF) 250 MCG/5ML IJ SOLN
INTRAMUSCULAR | Status: DC | PRN
  Administered 2020-05-05 (×2): 25 ug via INTRAVENOUS
  Administered 2020-05-05: 50 ug via INTRAVENOUS

## 2020-05-05 MED ORDER — FENTANYL CITRATE (PF) 250 MCG/5ML IJ SOLN
INTRAMUSCULAR | Status: AC
  Filled 2020-05-05: qty 5

## 2020-05-05 MED ORDER — 0.9 % SODIUM CHLORIDE (POUR BTL) OPTIME
TOPICAL | Status: DC | PRN
  Administered 2020-05-05: 1000 mL

## 2020-05-05 MED ORDER — LIDOCAINE-EPINEPHRINE (PF) 1 %-1:200000 IJ SOLN
INTRAMUSCULAR | Status: DC | PRN
  Administered 2020-05-05: 5 mL

## 2020-05-05 MED ORDER — CHLORHEXIDINE GLUCONATE 0.12 % MT SOLN
15.0000 mL | Freq: Once | OROMUCOSAL | Status: AC
  Administered 2020-05-05: 15 mL via OROMUCOSAL
  Filled 2020-05-05: qty 15

## 2020-05-05 MED ORDER — ONDANSETRON HCL 4 MG/2ML IJ SOLN
INTRAMUSCULAR | Status: AC
  Filled 2020-05-05: qty 4

## 2020-05-05 MED ORDER — HYDROCODONE-ACETAMINOPHEN 5-325 MG PO TABS: 1.0000 | ORAL_TABLET | Freq: Four times a day (QID) | ORAL | 0 refills | Status: DC | PRN

## 2020-05-05 MED ORDER — LIDOCAINE 2% (20 MG/ML) 5 ML SYRINGE
INTRAMUSCULAR | Status: AC
  Filled 2020-05-05: qty 5

## 2020-05-05 MED ORDER — MIDAZOLAM HCL 2 MG/2ML IJ SOLN
INTRAMUSCULAR | Status: AC
  Filled 2020-05-05: qty 2

## 2020-05-05 MED ORDER — ORAL CARE MOUTH RINSE: 15.0000 mL | Freq: Once | OROMUCOSAL | Status: AC

## 2020-05-05 SURGICAL SUPPLY — 43 items
ARMBAND PINK RESTRICT EXTREMIT (MISCELLANEOUS) ×3 IMPLANT
BENZOIN TINCTURE PRP APPL 2/3 (GAUZE/BANDAGES/DRESSINGS) IMPLANT
CANISTER SUCT 3000ML PPV (MISCELLANEOUS) ×3 IMPLANT
CANNULA VESSEL 3MM 2 BLNT TIP (CANNULA) ×3 IMPLANT
CHLORAPREP W/TINT 26 (MISCELLANEOUS) ×3 IMPLANT
CLIP VESOCCLUDE MED 6/CT (CLIP) ×3 IMPLANT
CLIP VESOCCLUDE SM WIDE 6/CT (CLIP) ×3 IMPLANT
CLOSURE STERI-STRIP 1/2X4 (GAUZE/BANDAGES/DRESSINGS)
CLOSURE WOUND 1/2 X4 (GAUZE/BANDAGES/DRESSINGS)
CLSR STERI-STRIP ANTIMIC 1/2X4 (GAUZE/BANDAGES/DRESSINGS) IMPLANT
COVER PROBE W GEL 5X96 (DRAPES) IMPLANT
COVER WAND RF STERILE (DRAPES) IMPLANT
DERMABOND ADVANCED (GAUZE/BANDAGES/DRESSINGS) ×2
DERMABOND ADVANCED .7 DNX12 (GAUZE/BANDAGES/DRESSINGS) ×1 IMPLANT
ELECT REM PT RETURN 9FT ADLT (ELECTROSURGICAL) ×3
ELECTRODE REM PT RTRN 9FT ADLT (ELECTROSURGICAL) ×1 IMPLANT
GAUZE SPONGE 4X4 12PLY STRL (GAUZE/BANDAGES/DRESSINGS) ×3 IMPLANT
GLOVE SURG SS PI 8.0 STRL IVOR (GLOVE) ×3 IMPLANT
GOWN STRL REUS W/ TWL LRG LVL3 (GOWN DISPOSABLE) ×2 IMPLANT
GOWN STRL REUS W/ TWL XL LVL3 (GOWN DISPOSABLE) ×1 IMPLANT
GOWN STRL REUS W/TWL LRG LVL3 (GOWN DISPOSABLE) ×4
GOWN STRL REUS W/TWL XL LVL3 (GOWN DISPOSABLE) ×2
INSERT FOGARTY SM (MISCELLANEOUS) IMPLANT
KIT BASIN OR (CUSTOM PROCEDURE TRAY) ×3 IMPLANT
KIT TURNOVER KIT B (KITS) ×3 IMPLANT
NEEDLE HYPO 25GX1X1/2 BEV (NEEDLE) ×3 IMPLANT
NS IRRIG 1000ML POUR BTL (IV SOLUTION) ×3 IMPLANT
PACK CV ACCESS (CUSTOM PROCEDURE TRAY) ×3 IMPLANT
PAD ARMBOARD 7.5X6 YLW CONV (MISCELLANEOUS) ×6 IMPLANT
STRIP CLOSURE SKIN 1/2X4 (GAUZE/BANDAGES/DRESSINGS) IMPLANT
SUT MNCRL AB 4-0 PS2 18 (SUTURE) ×6 IMPLANT
SUT PROLENE 4 0 RB 1 (SUTURE) ×4
SUT PROLENE 4-0 RB1 .5 CRCL 36 (SUTURE) ×2 IMPLANT
SUT PROLENE 6 0 BV (SUTURE) ×6 IMPLANT
SUT VIC AB 3-0 SH 27 (SUTURE) ×4
SUT VIC AB 3-0 SH 27X BRD (SUTURE) ×2 IMPLANT
SUT VIC AB 4-0 PS2 18 (SUTURE) ×3 IMPLANT
SYR BULB IRRIG 60ML STRL (SYRINGE) ×3 IMPLANT
SYR CONTROL 10ML LL (SYRINGE) ×3 IMPLANT
TAPE CLOTH SURG 4X10 WHT LF (GAUZE/BANDAGES/DRESSINGS) ×3 IMPLANT
TOWEL GREEN STERILE (TOWEL DISPOSABLE) ×3 IMPLANT
UNDERPAD 30X36 HEAVY ABSORB (UNDERPADS AND DIAPERS) ×3 IMPLANT
WATER STERILE IRR 1000ML POUR (IV SOLUTION) ×3 IMPLANT

## 2020-05-05 NOTE — Anesthesia Postprocedure Evaluation (Signed)
Anesthesia Post Note  Patient: [name]  Procedure(s) Performed: LEFT UPPER EXTREMITY ARTERIOVENOUS FISTULA REVISON (Left Arm Upper)     Patient location during evaluation: PACU Anesthesia Type: MAC Level of consciousness: awake and alert Pain management: pain level controlled Vital Signs Assessment: post-procedure vital signs reviewed and stable Respiratory status: spontaneous breathing, nonlabored ventilation, respiratory function stable and patient connected to nasal cannula oxygen Cardiovascular status: stable and blood pressure returned to baseline Postop Assessment: no apparent nausea or vomiting Anesthetic complications: no   No complications documented.  Last Vitals:  Vitals:   05/05/20 1335 05/05/20 1350  BP: 133/85 (!) 141/87  Pulse: 88 87  Resp: 14 15  Temp:  (!) 36.2 C  SpO2: 97% 98%    Last Pain:  Vitals:   05/05/20 1350  TempSrc:   PainSc: 0-No pain                 Belenda Cruise P Anneke Cundy

## 2020-05-05 NOTE — Discharge Instructions (Signed)
° °  [facility] [place] ° °Discharge Instructions ° °AV Fistula or Graft Surgery for Dialysis Access ° °Please refer to the following instructions for your post-procedure care. Your surgeon or physician assistant will discuss any changes with you. ° °Activity ° °You may drive the day following your surgery, if you are comfortable and no longer taking prescription pain medication. Resume full activity as the soreness in your incision resolves. ° °Bathing/Showering ° °You may shower after you go home. Keep your incision dry for 48 hours. Do not soak in a bathtub, hot tub, or swim until the incision heals completely. You may not shower if you have a hemodialysis catheter. ° °Incision Care ° °Clean your incision with mild soap and water after 48 hours. Pat the area dry with a clean towel. You do not need a bandage unless otherwise instructed. Do not apply any ointments or creams to your incision. You may have skin glue on your incision. Do not peel it off. It will come off on its own in about one week. Your arm may swell a bit after surgery. To reduce swelling use pillows to elevate your arm so it is above your heart. Your doctor will tell you if you need to lightly wrap your arm with an ACE bandage. ° °Diet ° °Resume your normal diet. There are not special food restrictions following this procedure. In order to heal from your surgery, it is CRITICAL to get adequate nutrition. Your body requires vitamins, minerals, and protein. Vegetables are the best source of vitamins and minerals. Vegetables also provide the perfect balance of protein. Processed food has little nutritional value, so try to avoid this. ° °Medications ° °Resume taking all of your medications. If your incision is causing pain, you may take over-the counter pain relievers such as acetaminophen (Tylenol). If you were prescribed a stronger pain medication, please be aware these medications can cause nausea and constipation. Prevent  nausea by taking the medication with a snack or meal. Avoid constipation by drinking plenty of fluids and eating foods with high amount of fiber, such as fruits, vegetables, and grains. Do not take Tylenol if you are taking prescription pain medications. ° ° ° ° °Follow up °Your surgeon may want to see you in the office following your access surgery. If so, this will be arranged at the time of your surgery. ° °Please call us immediately for any of the following conditions: ° °Increased pain, redness, drainage (pus) from your incision site °Fever of 101 degrees or higher °Severe or worsening pain at your incision site °Hand pain or numbness. ° °Reduce your risk of vascular disease: ° °Stop smoking. If you would like help, call [facility] at 1-800-QUIT-NOW (1-800-784-8669) or [place] at 336-586-4000 ° °Manage your cholesterol °Maintain a desired weight °Control your diabetes °Keep your blood pressure down ° °Dialysis ° °It will take several weeks to several months for your new dialysis access to be ready for use. Your surgeon will determine when it is OK to use it. Your nephrologist will continue to direct your dialysis. You can continue to use your Permcath until your new access is ready for use. ° °If you have any questions, please call the office at [phone]. ° °

## 2020-05-05 NOTE — Transfer of Care (Signed)
Immediate Anesthesia Transfer of Care Note  Patient: [name]  Procedure(s) Performed: LEFT UPPER EXTREMITY ARTERIOVENOUS FISTULA REVISON (Left Arm Upper)  Patient Location: PACU  Anesthesia Type:MAC  Level of Consciousness: awake, alert  and patient cooperative  Airway & Oxygen Therapy: Patient Spontanous Breathing  Post-op Assessment: Report given to RN and Post -op Vital signs reviewed and stable  Post vital signs: Reviewed and stable  Last Vitals:  Vitals Value Taken Time  BP 124/81 05/05/20 1305  Temp    Pulse 89 05/05/20 1305  Resp 17 05/05/20 1305  SpO2 93 % 05/05/20 1305  Vitals shown include unvalidated device data.  Last Pain:  Vitals:   05/05/20 0830  TempSrc:   PainSc: 0-No pain         Complications: No complications documented.

## 2020-05-05 NOTE — Interval H&P Note (Signed)
History and Physical Interval Note:  05/05/2020 11:32 AM  [name]  has presented today for surgery, with the diagnosis of ESRD.  The various methods of treatment have been discussed with the patient and family. After consideration of risks, benefits and other options for treatment, the patient has consented to  Procedure(s): LEFT UPPER EXTREMITY ARTERIOVENOUS FISTULA REVISON (Left) as a surgical intervention.  The patient's history has been reviewed, patient examined, no change in status, stable for surgery.  I have reviewed the patient's chart and labs.  Questions were answered to the patient's satisfaction.     Cherre Robins

## 2020-05-05 NOTE — Progress Notes (Signed)
Per patient PTA at 0400 CBG 56, patient stated drank 4oz of sprite, re-checked 45 minutes after and was in the 90's. Upon arrival to Short Stay CBG checked, it was 138.

## 2020-05-06 ENCOUNTER — Encounter (HOSPITAL_COMMUNITY): Payer: Self-pay | Admitting: Vascular Surgery

## 2020-05-06 ENCOUNTER — Telehealth: Payer: Self-pay

## 2020-05-06 DIAGNOSIS — N185 Chronic kidney disease, stage 5: Secondary | ICD-10-CM | POA: Diagnosis not present

## 2020-05-06 NOTE — Op Note (Signed)
DATE OF SERVICE: 05/05/20  PATIENT:  [name]  29 y.o. female  PRE-OPERATIVE DIAGNOSIS:  End Stage Renal Disease  POST-OPERATIVE DIAGNOSIS:  End Stage Renal Disease  PROCEDURE:  Procedure(s): LEFT UPPER EXTREMITY ARTERIOVENOUS FISTULA REVISON (Left)  SURGEON:  Surgeon(s) and Role:    * Cherre Robins, MD - Primary  ASSISTANT: Matt Eveland, PA-C  ANESTHESIA:   local and IV sedation  EBL:  20 mL   BLOOD ADMINISTERED:none  DRAINS: none   LOCAL MEDICATIONS USED:  LIDOCAINE   SPECIMEN:  none  DISPOSITION OF SPECIMEN:  n/a  COUNTS: confirmed correct.  TOURNIQUET:  * No tourniquets in log *  PLAN OF CARE: Discharge to home after PACU  PATIENT DISPOSITION:  PACU - hemodynamically stable.   Delay start of Pharmacological VTE agent (>24hrs) due to surgical blood loss or risk of bleeding: no  INDICATION FOR PROCEDURE: [name] is a 29 y.o. 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 After careful discussion of risks, benefits, and alternatives the patient was offered revision of LUE AVF. The patient understood and wished to proceed.  DESCRIPTION OF PROCEDURE: After identification of the patient in the pre-operative holding area, the patient was transferred to the operating room. The patient was positioned supine on the operating room table. Anesthesia was induced. The left arm was prepped and draped in standard fashion. A surgical pause was performed confirming correct patient, procedure, and operative location.  An ellipse incision was made over proximal segment of aneurysmal AV fistula.  Incision was carried down with combination of sharp and electrocautery dissection until the fistula was encountered.  The fistula was skeletonized.  The ellipse of skin which had previously been ulcerated was left attached to the roof of the fistula.  Patient was anticoagulated with 5000 units of intravenous heparin.  Gregory clamps were applied  proximally and distally on the fistula.  The ulcerated skin and anterior aspect of the aneurysmal AV fistula were excised.  The fistula was plicated with a 2 layer closure of 4-0 Prolene suture.  Clamps were released.  Hemostasis was achieved.  The wound was closed in layers using 3-0 Vicryl and 4-0 Monocryl.  Patient may dialyze in the segment of fistula immediately distal to the repair.   Upon completion of the case instrument and sharps counts were confirmed correct. The patient was transferred to the PACU in good condition. I was present for all portions of the procedure.  [name]. Stanford Breed, MD [facility] Phone Number: [phone] 05/06/2020 4:55 PM

## 2020-05-06 NOTE — Telephone Encounter (Signed)
LVM for pt to call office for appt

## 2020-06-03 ENCOUNTER — Other Ambulatory Visit: Payer: Self-pay

## 2020-06-03 DIAGNOSIS — N186 End stage renal disease: Secondary | ICD-10-CM

## 2020-06-17 ENCOUNTER — Ambulatory Visit (HOSPITAL_COMMUNITY)
Admission: RE | Admit: 2020-06-17 | Discharge: 2020-06-17 | Disposition: A | Discharge status: Home / Self Care | Payer: Medicaid Other | Source: Ambulatory Visit | Attending: Physician Assistant | Admitting: Physician Assistant

## 2020-06-17 ENCOUNTER — Ambulatory Visit (INDEPENDENT_AMBULATORY_CARE_PROVIDER_SITE_OTHER): Payer: Self-pay | Admitting: Physician Assistant

## 2020-06-17 ENCOUNTER — Other Ambulatory Visit: Payer: Self-pay

## 2020-06-17 VITALS — BP 189/103 | HR 94 | Temp 98.3°F | Resp 20 | Ht 61.0 in | Wt 140.3 lb

## 2020-06-17 DIAGNOSIS — N186 End stage renal disease: Secondary | ICD-10-CM | POA: Insufficient documentation

## 2020-06-17 NOTE — Progress Notes (Signed)
POST OPERATIVE OFFICE NOTE    CC:  F/u for surgery  HPI:  This is a 29 y.o. female who is s/p plication/revision of left arm fistula on 05/05/2020 by Dr. Stanford Breed.    Her original fistula was created at [facility] about 3 years ago.  She has required balloon angioplasty of outflow vein at [facility] in the past.    She states the fistula is working well.  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  She states that the plan was to fix the proximal aneurysm and then fix the distal aneurysm once the first one had healed.   Pt states she does not have pain/numbness in the left hand.    The pt is on dialysis T/T/S at [place] location.  She is currently enrolled at [employer] majoring in [employer].    No Known Allergies  Current Outpatient Medications  Medication Sig Dispense Refill  . acetaminophen (TYLENOL) 500 MG tablet Take 1,000 mg by mouth every 6 (six) hours as needed for headache (pain).    Marland Kitchen amLODipine (NORVASC) 5 MG tablet One bid (Patient taking differently: Take 5 mg by mouth at bedtime. ) 60 tablet 0  . carvedilol (COREG) 12.5 MG tablet Take 12.5 mg by mouth every evening.    . cinacalcet (SENSIPAR) 30 MG tablet Take 30 mg by mouth See admin instructions. Take one tablet (30 mg) by mouth every Tuesday, Thursday, Saturday after dialysis in the evening.    Marland Kitchen HYDROcodone-acetaminophen (NORCO) 5-325 MG tablet Take 1 tablet by mouth every 6 (six) hours as needed for moderate pain. 15 tablet 0  . insulin aspart (NOVOLOG) 100 UNIT/ML injection Inject 0-9 Units into the skin 3 (three) times daily with meals. (Patient taking differently: Inject 5 Units into the skin 3 (three) times daily with meals. ) 10 mL 11  . insulin glargine (LANTUS) 100 UNIT/ML injection Inject 0.22 mLs (22 Units total) into the skin at bedtime. (Patient taking differently: Inject 15 Units into the skin at bedtime. ) 10 mL 0  . Insulin Pen Needle (B-D ULTRAFINE III SHORT PEN) 31G X 8 MM MISC USE WITH  INSULIN FOUR TIMES DAILY    . multivitamin (RENA-VIT) TABS tablet Take 1 tablet by mouth at bedtime. (Patient taking differently: Take 1 tablet by mouth daily after lunch. ) 30 tablet 0  . ondansetron (ZOFRAN ODT) 4 MG disintegrating tablet Take 1 tablet (4 mg total) by mouth every 8 (eight) hours as needed for nausea or vomiting. 10 tablet 0  . promethazine (PHENERGAN) 25 MG tablet Take 1 tablet (25 mg total) by mouth every 6 (six) hours as needed for nausea or vomiting. 12 tablet 0  . sevelamer carbonate (RENVELA) 800 MG tablet Take 800 mg by mouth with breakfast, with lunch, and with evening meal.      No current facility-administered medications for this visit.     ROS:  See HPI  Physical Exam:  Today's Vitals   06/17/20 1047  BP: (!) 189/103  Pulse: 94  Resp: 20  Temp: 98.3 F (36.8 C)  TempSrc: Temporal  SpO2: 97%  Weight: 140 lb 4.8 oz (63.6 kg)  Height: 5\' 1"  (1.549 m)   Body mass index is 26.51 kg/m.   Incision:  Well healed with spitting stitch.  Extremities:   There is a palpable left radial pulse.   Motor and sensory are in tact.   There is a thrill present.  The fistula is easily palpable  Dialysis Duplex on 06/17/2020: +--------------------+----------+-----------------+--------+  AVF         PSV (cm/s)Flow Vol (mL/min)Comments  +--------------------+----------+-----------------+--------+  Native artery inflow  287     2836          +--------------------+----------+-----------------+--------+  AVF Anastomosis     517                 +--------------------+----------+-----------------+--------+     +------------+----------+-------------+----------+--------+  OUTFLOW VEINPSV (cm/s)Diameter (cm)Depth (cm)Describe  +------------+----------+-------------+----------+--------+  Prox UA     303    0.59     0.21        +------------+----------+-------------+----------+--------+   Mid UA     230    0.96     0.20        +------------+----------+-------------+----------+--------+  Dist UA     207    1.83     0.26        +------------+----------+-------------+----------+--------+  AC Fossa    398    0.91     0.39        +------------+----------+-------------+----------+--------+    Assessment/Plan:  This is a 29 y.o. female who is s/p: plication/revision of left arm fistula on 05/05/2020 by Dr. Stanford Breed.    -the pt is doing well from previous surgery.  Her fistula is working well.  Incision has healed.  It does have a spitting stitch and I offered to trim this but she would rather just let it fall out.  -her duplex does reveal a slight narrowing at the axillary artery, otherwise, diameter/depth looks good.   -the proximal fistula can be stuck starting on 07/01/2020.  Once this has been used without difficulty, we can plan repair of the distal aneurysm.  She will f/u with Dr. Stanford Breed in 4 weeks for this discussion.  -she understands if there are any issues, she can call us sooner.    Leontine Locket, [facility] [phone]  Clinic MD:  Stanford Breed

## 2020-07-15 ENCOUNTER — Emergency Department (HOSPITAL_COMMUNITY)
Admission: EM | Admit: 2020-07-15 | Discharge: 2020-07-16 | Disposition: A | Discharge status: Home / Self Care | Payer: Medicaid Other | Attending: Emergency Medicine | Admitting: Emergency Medicine

## 2020-07-15 ENCOUNTER — Emergency Department (HOSPITAL_COMMUNITY): Payer: Medicaid Other

## 2020-07-15 ENCOUNTER — Encounter (HOSPITAL_COMMUNITY): Payer: Self-pay | Admitting: Emergency Medicine

## 2020-07-15 DIAGNOSIS — Z79899 Other long term (current) drug therapy: Secondary | ICD-10-CM | POA: Insufficient documentation

## 2020-07-15 DIAGNOSIS — R0602 Shortness of breath: Secondary | ICD-10-CM | POA: Insufficient documentation

## 2020-07-15 DIAGNOSIS — R1030 Lower abdominal pain, unspecified: Secondary | ICD-10-CM | POA: Diagnosis not present

## 2020-07-15 DIAGNOSIS — Z794 Long term (current) use of insulin: Secondary | ICD-10-CM | POA: Diagnosis not present

## 2020-07-15 DIAGNOSIS — R55 Syncope and collapse: Secondary | ICD-10-CM | POA: Diagnosis not present

## 2020-07-15 DIAGNOSIS — Z992 Dependence on renal dialysis: Secondary | ICD-10-CM | POA: Insufficient documentation

## 2020-07-15 DIAGNOSIS — I120 Hypertensive chronic kidney disease with stage 5 chronic kidney disease or end stage renal disease: Secondary | ICD-10-CM | POA: Diagnosis not present

## 2020-07-15 DIAGNOSIS — N186 End stage renal disease: Secondary | ICD-10-CM | POA: Diagnosis not present

## 2020-07-15 DIAGNOSIS — S0012XA Contusion of left eyelid and periocular area, initial encounter: Secondary | ICD-10-CM | POA: Insufficient documentation

## 2020-07-15 DIAGNOSIS — E1022 Type 1 diabetes mellitus with diabetic chronic kidney disease: Secondary | ICD-10-CM | POA: Diagnosis not present

## 2020-07-15 DIAGNOSIS — Y92410 Unspecified street and highway as the place of occurrence of the external cause: Secondary | ICD-10-CM | POA: Insufficient documentation

## 2020-07-15 DIAGNOSIS — S0512XA Contusion of eyeball and orbital tissues, left eye, initial encounter: Secondary | ICD-10-CM

## 2020-07-15 DIAGNOSIS — S0592XA Unspecified injury of left eye and orbit, initial encounter: Secondary | ICD-10-CM | POA: Diagnosis present

## 2020-07-15 LAB — BASIC METABOLIC PANEL
Anion gap: 16 — ABNORMAL HIGH (ref 5–15)
BUN: 33 mg/dL — ABNORMAL HIGH (ref 6–20)
CO2: 27 mmol/L (ref 22–32)
Calcium: 8.8 mg/dL — ABNORMAL LOW (ref 8.9–10.3)
Chloride: 90 mmol/L — ABNORMAL LOW (ref 98–111)
Creatinine, Ser: 8.54 mg/dL — ABNORMAL HIGH (ref 0.44–1.00)
GFR, Estimated: 6 mL/min — ABNORMAL LOW (ref >60)
Glucose, Bld: 488 mg/dL — ABNORMAL HIGH (ref 70–99)
Potassium: 5.1 mmol/L (ref 3.5–5.1)
Sodium: 133 mmol/L — ABNORMAL LOW (ref 135–145)

## 2020-07-15 LAB — CBG MONITORING, ED: Glucose-Capillary: 391 mg/dL — ABNORMAL HIGH (ref 70–99)

## 2020-07-15 LAB — CBC
HCT: 28.5 % — ABNORMAL LOW (ref 36.0–46.0)
Hemoglobin: 8.9 g/dL — ABNORMAL LOW (ref 12.0–15.0)
MCH: 30 pg (ref 26.0–34.0)
MCHC: 31.2 g/dL (ref 30.0–36.0)
MCV: 96 fL (ref 80.0–100.0)
Platelets: 128 10*3/uL — ABNORMAL LOW (ref 150–400)
RBC: 2.97 MIL/uL — ABNORMAL LOW (ref 3.87–5.11)
RDW: 14.7 % (ref 11.5–15.5)
WBC: 5.4 10*3/uL (ref 4.0–10.5)
nRBC: 0 % (ref 0.0–0.2)

## 2020-07-15 LAB — I-STAT BETA HCG BLOOD, ED (MC, WL, AP ONLY): I-stat hCG, quantitative: <5 m[IU]/mL (ref <5)

## 2020-07-15 NOTE — ED Notes (Signed)
Patients mother requests patient blood sugar be checked.
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
## 2020-07-16 MED ORDER — CYCLOBENZAPRINE HCL 10 MG PO TABS: 10.0000 mg | ORAL_TABLET | Freq: Three times a day (TID) | ORAL | 0 refills | Status: DC | PRN

## 2020-07-16 MED ORDER — HYDROCODONE-ACETAMINOPHEN 5-325 MG PO TABS
2.0000 | ORAL_TABLET | Freq: Once | ORAL | Status: AC
  Administered 2020-07-16: 2 via ORAL
  Filled 2020-07-16: qty 2
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
## 2020-07-22 ENCOUNTER — Ambulatory Visit (INDEPENDENT_AMBULATORY_CARE_PROVIDER_SITE_OTHER): Payer: Medicaid Other | Admitting: Vascular Surgery

## 2020-07-22 ENCOUNTER — Encounter: Payer: Self-pay | Admitting: Vascular Surgery

## 2020-07-22 ENCOUNTER — Other Ambulatory Visit: Payer: Self-pay

## 2020-07-22 VITALS — BP 176/99 | HR 82 | Temp 98.2°F | Resp 20 | Ht 61.0 in | Wt 140.0 lb

## 2020-07-22 DIAGNOSIS — N186 End stage renal disease: Secondary | ICD-10-CM
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
## 2020-07-23 ENCOUNTER — Other Ambulatory Visit: Payer: Self-pay

## 2020-07-23 DIAGNOSIS — N186 End stage renal disease: Secondary | ICD-10-CM

## 2020-08-13 DIAGNOSIS — H4312 Vitreous hemorrhage, left eye: Secondary | ICD-10-CM | POA: Insufficient documentation

## 2020-08-13 DIAGNOSIS — H35351 Cystoid macular degeneration, right eye: Secondary | ICD-10-CM | POA: Insufficient documentation

## 2020-08-19 ENCOUNTER — Telehealth: Payer: Medicaid Other | Admitting: Physician Assistant

## 2020-08-19 DIAGNOSIS — R112 Nausea with vomiting, unspecified: Secondary | ICD-10-CM

## 2020-08-19 NOTE — Progress Notes (Signed)
Based on what you shared with me, I feel your condition warrants further evaluation and I recommend that you be seen for a face to face office visit.  I am concerned about your persistent nausea and vomiting, especially in the setting of your other chronic medical conditions. You will need to be seen in person to have a physical exam and you may also need to have lab work completed to ensure that you are not in diabetic ketoacidosis or have abnormalities in your electrolytes.   NOTE: If you entered your credit card information for this eVisit, you will not be charged. You may see a "hold" on your card for the $35 but that hold will drop off and you will not have a charge processed.   If you are having a true medical emergency please call 911.      For an urgent face to face visit, [facility] has five urgent care centers for your convenience:     [facility] [place] at  Get Driving Directions [id number] [place], [place] [address] . 10 am - 6pm Monday - Friday    [facility]) Get Driving Directions [id number] [address] [place], [place] [address] . 10 am to 8 pm Monday-Friday . 12 pm to 8 pm [facility] at [facility] Get Driving Directions [id number] [address], [place], [place] [address] . 8 am to 8 pm Monday-Friday . 9 am to 6 pm Saturday . 11 am to 6 pm Sunday     [facility] at [facility] Get Driving Directions  [id number] [address] [place], [place] [address] . 8 am to 8 pm Monday-Friday . 8 am to 4 pm [facility] at [place] Get Driving Directions [id number] [address] [place], [place] [address] . 12 pm to 6 pm Monday-Friday      Your e-visit answers were reviewed by a board certified advanced clinical practitioner to complete your  personal care plan.  Thank you for using e-Visits.   Greater than 5 minutes, yet less than 10 minutes of time have been spend researching, coordinating, and implementing care for this patient today.

## 2020-09-08 ENCOUNTER — Encounter: Payer: Self-pay | Admitting: Nurse Practitioner

## 2020-10-01 DIAGNOSIS — I639 Cerebral infarction, unspecified: Secondary | ICD-10-CM | POA: Insufficient documentation

## 2020-10-04 IMAGING — CT CT HEAD W/O CM
4 series · 15 of 47 positions shown, 17 images · non-contrast
Comparison: None.

CLINICAL DATA: Onset headache today.  No known injury.

EXAM:
CT HEAD WITHOUT CONTRAST
TECHNIQUE: Contiguous axial images were obtained from the base of the skull
through the vertex without intravenous contrast.

[Series 3: head without · axial · non-contrast · 0.45mm/px · z∈[-163,-43]mm · 7 of 33 slices shown, 9 images]
[im 5/33  brain]
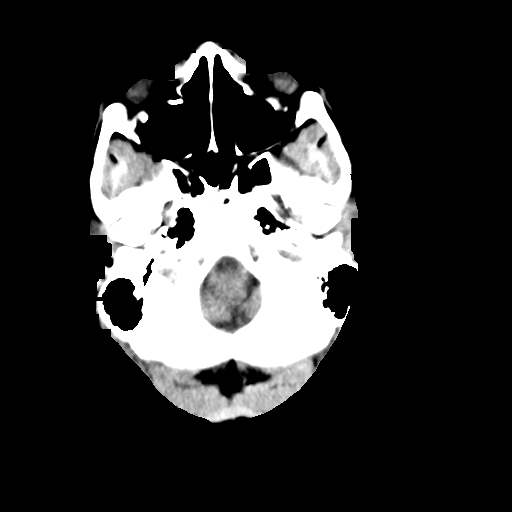
[im 5/33  bone]
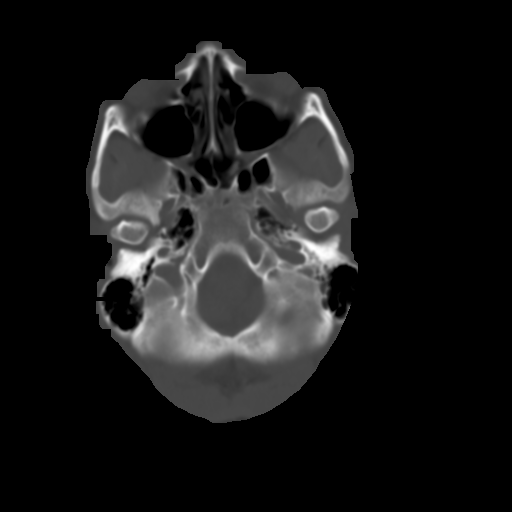
[im 9/33  brain]
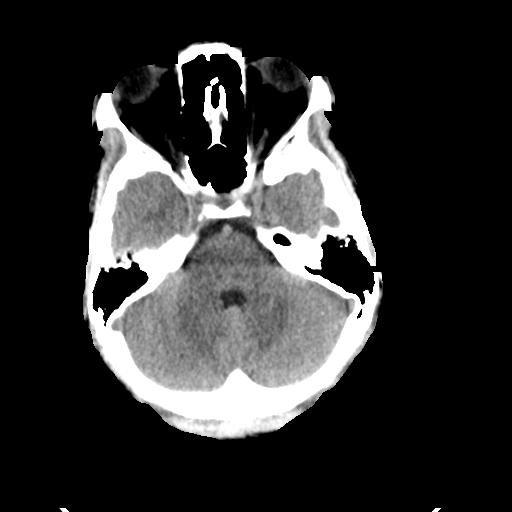
[im 13/33  brain]
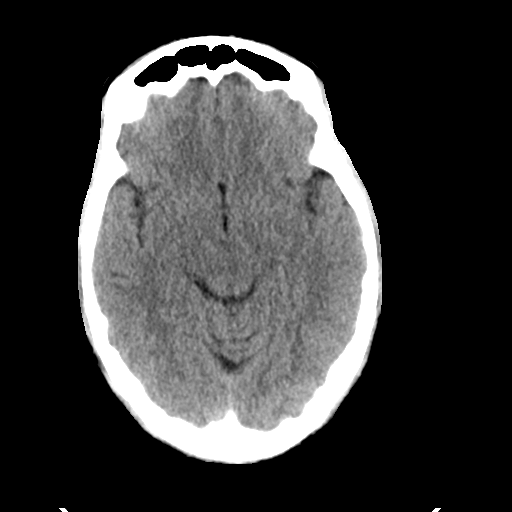
[im 17/33  brain]
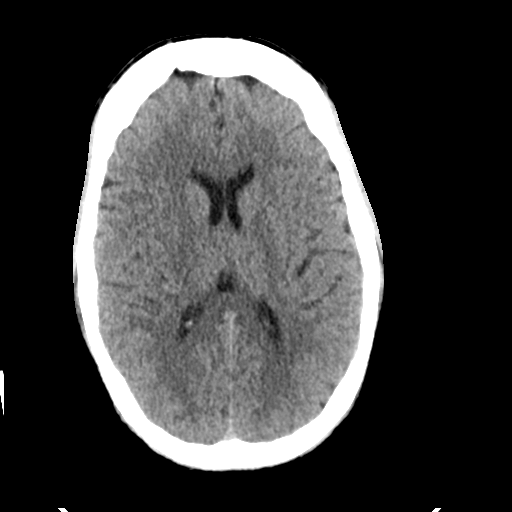
[im 21/33  brain]
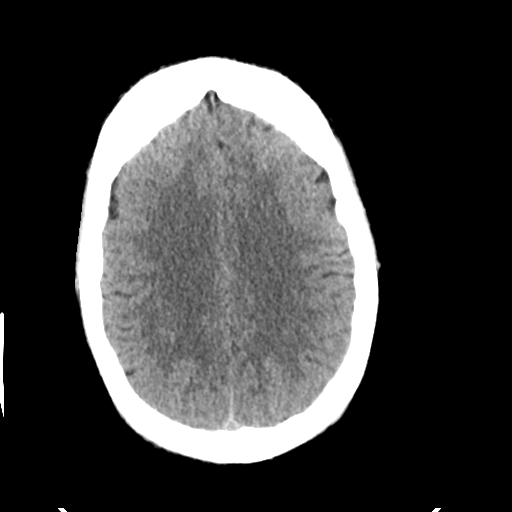
[im 21/33  bone]
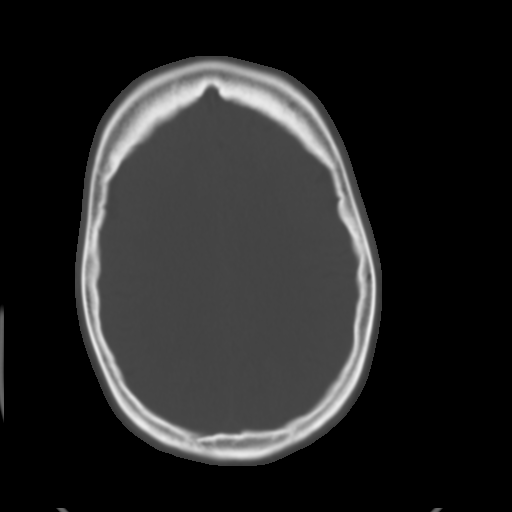
[im 25/33  brain]
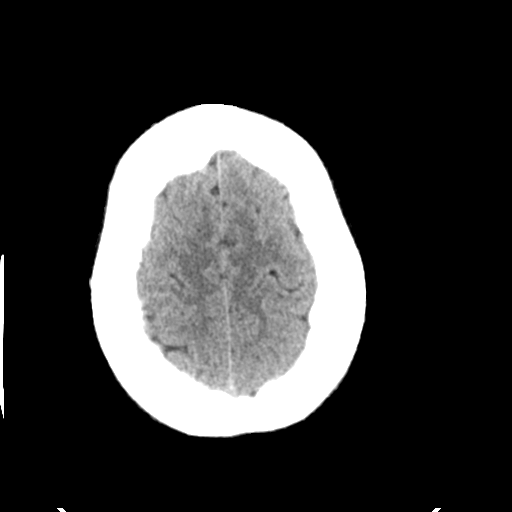
[im 29/33  brain]
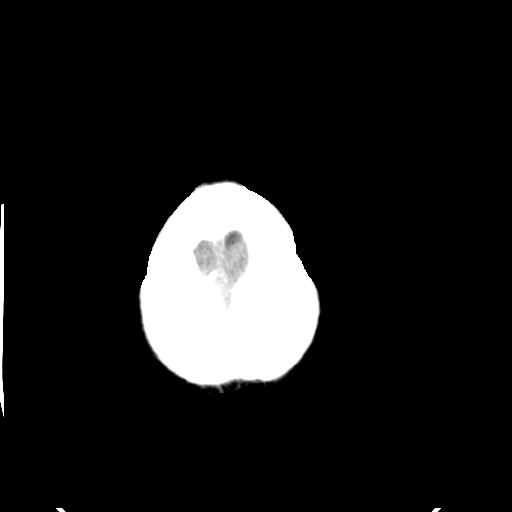

[Series 4: head bone · axial · 0.45mm/px · z∈[-167,-151]mm · 2 of 82 slices shown]
[im 9/82  bone]
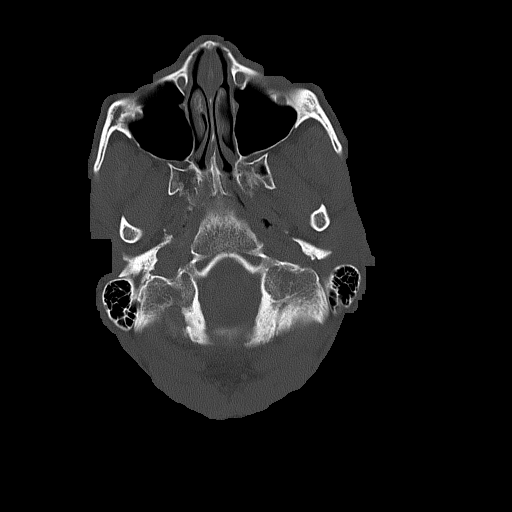
[im 17/82  bone]
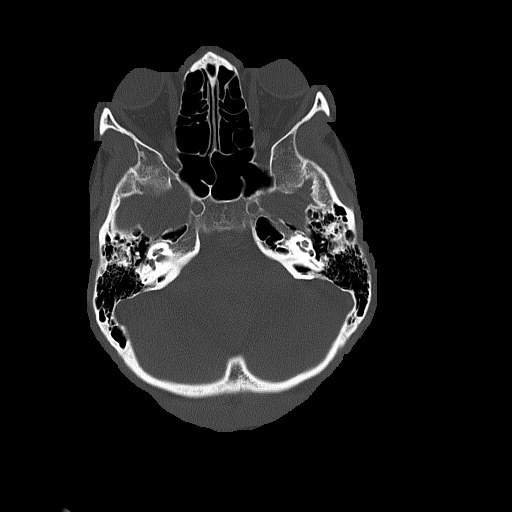

[Series 5: head without cor · coronal · non-contrast · 0.32mm/px · 3 of 72 slices shown]
[im 24/72  brain]
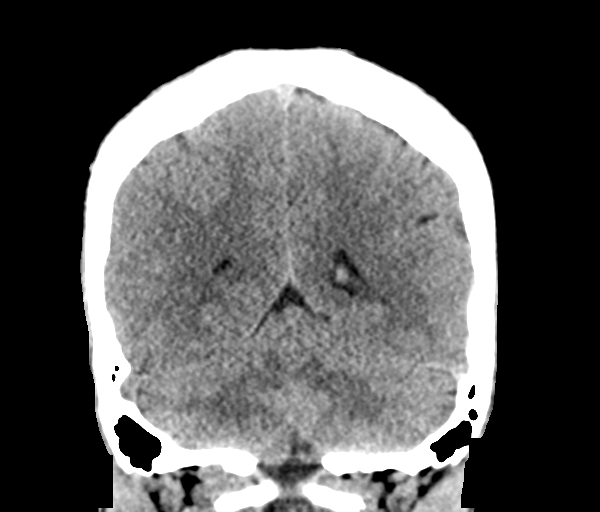
[im 32/72  brain]
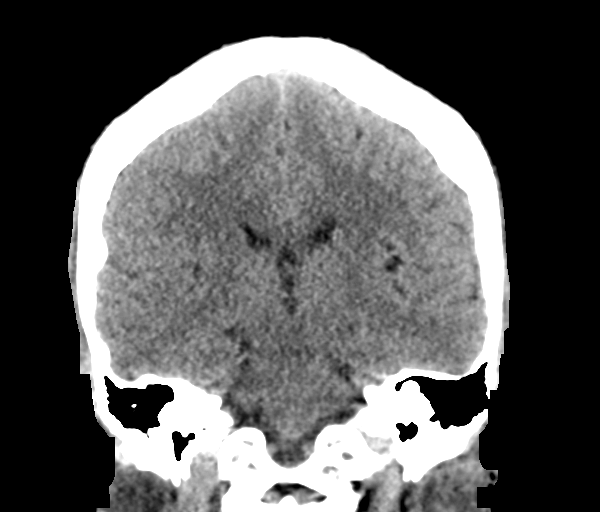
[im 40/72  brain]
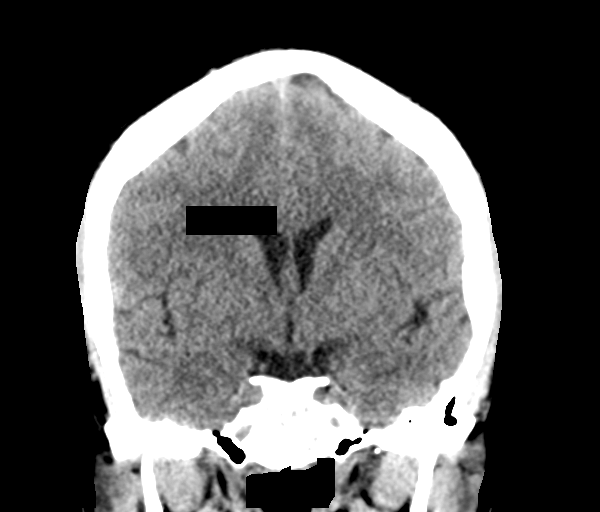

[Series 6: head without sag · sagittal · non-contrast · 0.32mm/px · 3 of 59 slices shown]
[im 20/59  brain]
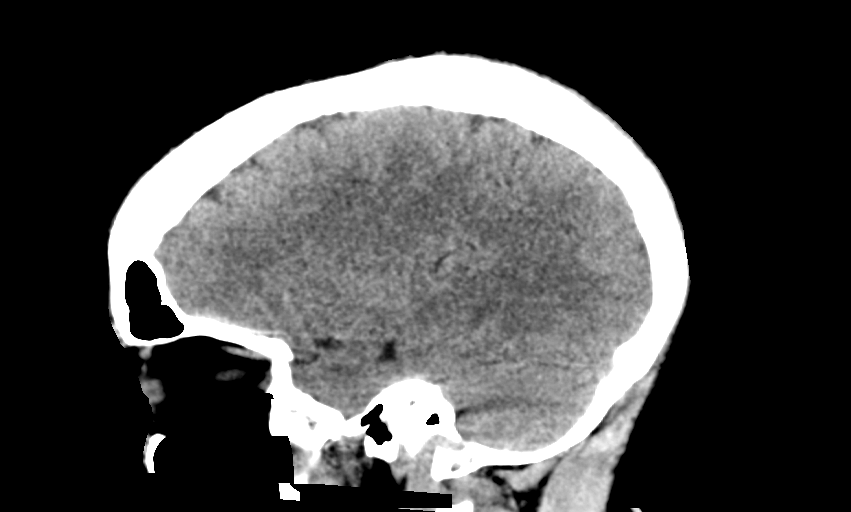
[im 30/59  brain]
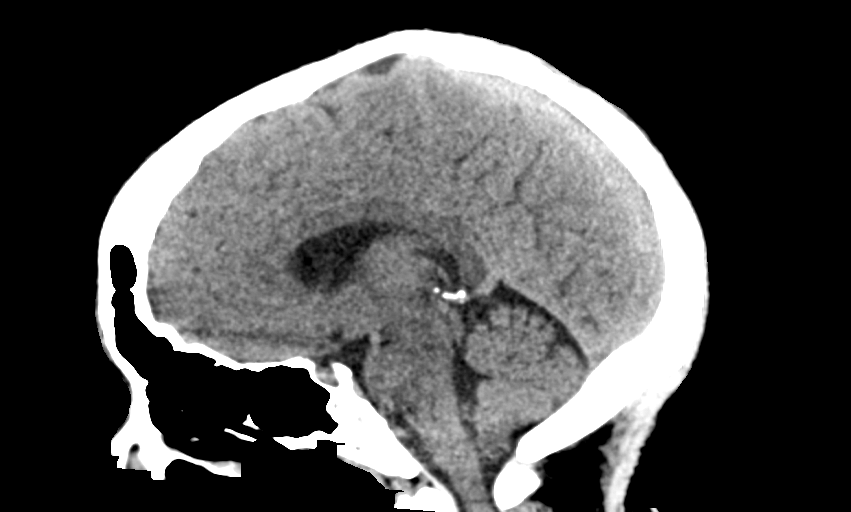
[im 39/59  brain]
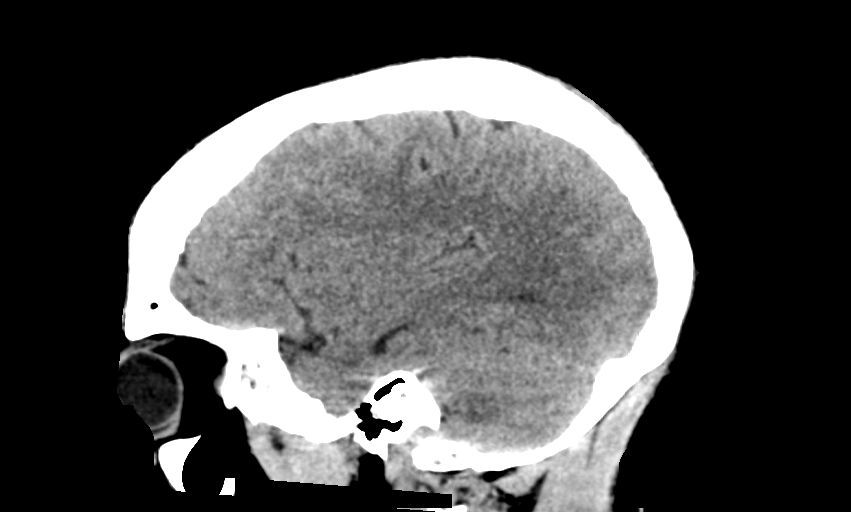

[15 of 47 positions shown; findings below may reference images not displayed]

FINDINGS: Brain: No evidence of acute infarction, hemorrhage, hydrocephalus,
extra-axial collection or mass lesion/mass effect.

Vascular: No hyperdense vessel or unexpected calcification.

Skull: Negative.

Sinuses/Orbits: Negative.

Other: None.
IMPRESSION: Normal head CT.

## 2020-10-20 NOTE — Progress Notes (Deleted)
VASC[facility] AND VEIN SPECIALISTS OF [place] PROGRESS NOTE  ASSESSMENT / PLAN: [name] is a 30 y.o. female status post plication of left upper extremity arteriovenous fistula 05/05/20. ***   SUBJECTIVE: ***  OBJECTIVE: There were no vitals taken for this visit. '@INTAKEOUTPUTBRIEF'$ @  Urine output over past 24 hours: ***  Constitutional: *** appearing. *** acute distress. CNS: *** Cardiac: ***. Pulmonary: *** Abdomen: *** Vascular: ***  CBC Latest Ref Rng & Units 07/15/2020 05/05/2020 04/30/2020  WBC 4.0 - 10.5 K/uL 5.4 - -  Hemoglobin 12.0 - 15.0 g/dL 8.9(L) 13.9 12.6  Hematocrit 36.0 - 46.0 % 28.5(L) 41.0 37.0  Platelets 150 - 400 K/uL 128(L) - -     CMP Latest Ref Rng & Units 07/15/2020 05/05/2020 04/30/2020  Glucose 70 - 99 mg/dL 488(H) 150(H) 293(H)  BUN 6 - 20 mg/dL 33(H) 32(H) 31(H)  Creatinine 0.44 - 1.00 mg/dL 8.54(H) 10.20(H) 8.40(H)  Sodium 135 - 145 mmol/L 133(L) 137 134(L)  Potassium 3.5 - 5.1 mmol/L 5.1 4.0 4.5  Chloride 98 - 111 mmol/L 90(L) 95(L) 93(L)  CO2 22 - 32 mmol/L 27 - -  Calcium 8.9 - 10.3 mg/dL 8.8(L) - -  Total Protein 6.5 - 8.1 g/dL - - -  Total Bilirubin 0.3 - 1.2 mg/dL - - -  Alkaline Phos 38 - 126 U/L - - -  AST 15 - 41 U/L - - -  ALT 0 - 44 U/L - - -    CrCl cannot be calculated (Patient's most recent lab result is older than the maximum 21 days allowed.).  ***  [name]. Stanford Breed, MD [facility] Phone Number: [phone] 10/20/2020 11:51 AM

## 2020-10-21 ENCOUNTER — Ambulatory Visit (HOSPITAL_COMMUNITY): Payer: Medicaid Other | Attending: Vascular Surgery

## 2020-10-21 ENCOUNTER — Ambulatory Visit: Payer: Medicaid Other | Admitting: Vascular Surgery

## 2020-10-30 ENCOUNTER — Ambulatory Visit (INDEPENDENT_AMBULATORY_CARE_PROVIDER_SITE_OTHER): Payer: Medicaid Other | Admitting: Internal Medicine

## 2020-10-30 ENCOUNTER — Other Ambulatory Visit: Payer: Self-pay

## 2020-10-30 ENCOUNTER — Encounter: Payer: Self-pay | Admitting: Internal Medicine

## 2020-10-30 VITALS — BP 150/88 | HR 94 | Temp 97.2°F | Ht 61.0 in | Wt 138.0 lb

## 2020-10-30 DIAGNOSIS — R058 Other specified cough: Secondary | ICD-10-CM

## 2020-10-30 DIAGNOSIS — R059 Cough, unspecified: Secondary | ICD-10-CM | POA: Diagnosis not present

## 2020-10-30 MED ORDER — PANTOPRAZOLE SODIUM 40 MG PO TBEC: 40.0000 mg | DELAYED_RELEASE_TABLET | Freq: Every day | ORAL | 2 refills | Status: AC

## 2020-10-30 MED ORDER — FAMOTIDINE 20 MG PO TABS: ORAL_TABLET | ORAL | 11 refills | Status: DC

## 2020-10-30 NOTE — Patient Instructions (Addendum)
GERD (REFLUX)  is an extremely common cause of respiratory symptoms just like yours , many times with no obvious heartburn at all.    It can be treated with medication, but also with lifestyle changes including elevation of the head of your bed (ideally with 6 -8inch blocks under the headboard of your bed),  Smoking cessation, avoidance of late meals, excessive alcohol, and avoid fatty foods, chocolate, peppermint, colas, red wine, and acidic juices such as orange juice.  NO MINT OR MENTHOL PRODUCTS SO NO COUGH DROPS  USE SUGARLESS CANDY INSTEAD (Jolley ranchers or Stover's or Life Savers) or even ice chips will also do - the key is to swallow to prevent all throat clearing. NO OIL BASED VITAMINS - use powdered substitutes.  Avoid fish oil when coughing.     Pantoprazole (protonix) 40 mg   Take  30-60 min before first meal of the day and Pepcid (famotidine)  20 mg after supper until return to office - this is the best way to tell whether stomach acid is contributing to your problem.     We will set you up for evaluation by Dr Bettina Gavia [facility]  ENT division

## 2020-10-30 NOTE — Progress Notes (Addendum)
[name], female    DOB: 07/05/1990     MRN: [id number]   Brief patient profile:   29 yobf  On HD x aug 2018 due to dm/hbp with h/o severe hyperglycemia/ ? cva  > et 2014 and voiced recovered p 9-10 months  but coughing since esp with strong smells / also assoc with cough/gag/ vomit sporadically with meals since et removed  and almost every meal x 2000       History of Present Illness  10/30/2020  Pulmonary/ 1st office eval/[name]  Chief Complaint  Patient presents with  . Pulmonary Consult    Referred by Marlyne Beards, NP. Pt c/o cough x 6 years- started after she had to be intubated when she had a stroke. She is coughing up clear sputum. Cough is triggered by "everything"- strong smells, heat, cold, exertion, lying down.    Dyspnea:  MMRC2 = can't walk a nl pace on a flat grade s sob but does fine slow and flat  Cough: clear daytime triggered by smells  Sleep: once gets to sleep does fine SABA use: none  No obvious day to day or daytime variability or assoc excess/ purulent sputum or mucus plugs or hemoptysis or cp or chest tightness, subjective wheeze or overt sinus or hb symptoms.   Sleeping as above without nocturnal  or early am exacerbation  of respiratory  c/o's or need for noct saba. Also denies any obvious fluctuation of symptoms with weather or environmental changes or other aggravating or alleviating factors except as outlined above   No unusual exposure hx or h/o childhood pna/ asthma or knowledge of premature birth.  Current Allergies, Complete Past Medical History, Past Surgical History, Family History, and Social History were reviewed in [employer] record.  ROS  The following are not active complaints unless bolded Hoarseness, sore throat/globus dysphagia, dental problems, itching, sneezing,  nasal congestion or discharge of excess mucus or purulent secretions, ear ache,   fever, chills, sweats, unintended wt loss or wt gain,  classically pleuritic or exertional cp,  orthopnea pnd or arm/hand swelling  or leg swelling, presyncope, palpitations, abdominal pain, anorexia, nausea, vomiting, diarrhea  or change in bowel habits or change in bladder habits, change in stools or change in urine, dysuria, hematuria,  rash, arthralgias, visual complaints, headache, numbness, weakness or ataxia or problems with walking or coordination,  change in mood or  memory.           Past Medical History:  Diagnosis Date  . Cardiac arrest ([place]) 03/2014  . Complication of anesthesia   . Diabetes mellitus type 1 ([place])    2005 dx [facility]  . DKA (diabetic ketoacidoses)   . ESRD (end stage renal disease) ([facility])    TTS HD, dialyzed at [facility]  . Hypertension   . PONV (postoperative nausea and vomiting)   . Stroke [facility]) 03/2014    Outpatient Medications Prior to Visit  Medication Sig Dispense Refill  . acetaminophen (TYLENOL) 500 MG tablet Take 1,000 mg by mouth every 6 (six) hours as needed for headache (pain).    Marland Kitchen amLODipine (NORVASC) 5 MG tablet Take 10 mg by mouth at bedtime.    . carvedilol (COREG) 12.5 MG tablet Take 12.5 mg by mouth every evening.    . cinacalcet (SENSIPAR) 30 MG tablet Take 30 mg by mouth See admin instructions. Take one tablet (30 mg) by mouth every Tuesday, Thursday, Saturday after dialysis in the evening.    Marland Kitchen  cloNIDine HCl (KAPVAY) 0.1 MG TB12 ER tablet Take 0.1 mg by mouth at bedtime.    . cyclobenzaprine (FLEXERIL) 10 MG tablet Take 1 tablet (10 mg total) by mouth 3 (three) times daily as needed for muscle spasms. 10 tablet 0  . heparin 1000 unit/mL SOLN injection Heparin Sodium (Porcine) 1,000 Units/mL Systemic    . insulin aspart (NOVOLOG) 100 UNIT/ML injection Inject 0-9 Units into the skin 3 (three) times daily with meals. (Patient taking differently: Inject 5 Units into the skin 3 (three) times daily with meals.) 10 mL 11  . insulin glargine (LANTUS) 100 UNIT/ML injection Inject 0.22 mLs (22  Units total) into the skin at bedtime. (Patient taking differently: Inject 15 Units into the skin at bedtime.) 10 mL 0  . Insulin Pen Needle (B-D ULTRAFINE III SHORT PEN) 31G X 8 MM MISC USE WITH INSULIN FOUR TIMES DAILY    . iron sucrose in sodium chloride 0.9 % 100 mL Iron Sucrose (Venofer)    . ketorolac (ACULAR) 0.5 % ophthalmic solution Apply to eye.    . multivitamin (RENA-VIT) TABS tablet Take 1 tablet by mouth at bedtime. (Patient taking differently: Take 1 tablet by mouth daily after lunch.) 30 tablet 0  . ondansetron (ZOFRAN ODT) 4 MG disintegrating tablet Take 1 tablet (4 mg total) by mouth every 8 (eight) hours as needed for nausea or vomiting. 10 tablet 0  . promethazine (PHENERGAN) 25 MG tablet Take 1 tablet (25 mg total) by mouth every 6 (six) hours as needed for nausea or vomiting. 12 tablet 0  . sevelamer carbonate (RENVELA) 800 MG tablet Take 800 mg by mouth with breakfast, with lunch, and with evening meal.     . doxazosin (CARDURA) 2 MG tablet Take 2 mg by mouth at bedtime.    Marland Kitchen HYDROcodone-acetaminophen (NORCO) 5-325 MG tablet Take 1 tablet by mouth every 6 (six) hours as needed for moderate pain. (Patient not taking: Reported on 07/22/2020) 15 tablet 0   No facility-administered medications prior to visit.     Objective:     BP (!) 150/88 (BP Location: Left Arm, Cuff Size: Normal)   Pulse 94   Temp (!) 97.2 F (36.2 C) (Temporal)   Ht '5\' 1"'$  (1.549 m)   Wt 138 lb (62.6 kg)   SpO2 100% Comment: on RA  BMI 26.07 kg/m   SpO2: 100 % (on RA)   Quiet amb bf nad with freq throat clearing   HEENT : pt wearing mask not removed for exam due to covid -19 concerns.    NECK :  without JVD/Nodes/TM/ nl carotid upstrokes bilaterally   LUNGS: no acc muscle use,  Nl contour chest which is clear to A and P bilaterally without cough on insp or exp maneuvers   CV:  RRR  no s3 or murmur or increase in P2, and no edema   ABD:  soft and nontender with nl inspiratory excursion  in the supine position. No bruits or organomegaly appreciated, bowel sounds nl  MS:  Nl gait/ ext warm without deformities, calf tenderness, cyanosis or clubbing No obvious joint restrictions   SKIN: warm and dry without lesions    NEURO:  alert, approp, nl sensorium with  no motor or cerebellar deficits apparent.      cxr done at [facility] per pt ok late April 2022       Assessment   Upper airway cough syndrome Onset 2014 p ET removed following cva assoc with oropharangeal dysphagia - MBS rec  10/30/2020  - max gerd rx 10/30/2020 d - ENT eval / Dr Joya Gaskins at [facility]  10/30/2020 referred   Classic Upper airway cough syndrome (previously labeled PNDS),  is so named because it's frequently impossible to sort out how much is  CR/sinusitis with freq throat clearing (which can be related to primary GERD)   vs  causing  secondary (" extra esophageal")  GERD from wide swings in gastric pressure that occur with throat clearing, often  promoting self use of mint and menthol lozenges that reduce the lower esophageal sphincter tone and exacerbate the problem further in a cyclical fashion.   These are the same pts (now being labeled as having "irritable larynx syndrome" by some cough centers) who not infrequently have a history of having failed to tolerate ace inhibitors,  dry powder inhalers or biphosphonates or report having atypical/extraesophageal reflux symptoms that don't respond to standard doses of PPI  and are easily confused as having aecopd or asthma flares by even experienced allergists/ pulmonologists (myself included).    rec as above, no pulmonary f/u needed.   [facility] so referred now to [place] [facility].         Each maintenance medication was reviewed in detail including emphasizing most importantly the difference between maintenance and prns and under what circumstances the prns are to be triggered using an action plan format where  appropriate.  Total time for H and P, chart review, counseling,  and generating customized AVS unique to this office visit / same day charting = 50 min           Christinia Gully, MD 10/30/2020

## 2020-10-31 ENCOUNTER — Other Ambulatory Visit (HOSPITAL_COMMUNITY): Payer: Self-pay

## 2020-10-31 ENCOUNTER — Encounter: Payer: Self-pay | Admitting: Internal Medicine

## 2020-10-31 DIAGNOSIS — R1310 Dysphagia, unspecified: Secondary | ICD-10-CM

## 2020-10-31 DIAGNOSIS — R058 Other specified cough: Secondary | ICD-10-CM | POA: Insufficient documentation

## 2020-10-31 NOTE — Assessment & Plan Note (Addendum)
Onset 2014 p ET removed following cva assoc with oropharangeal dysphagia - MBS rec 10/30/2020  - max gerd rx 10/30/2020 d - ENT eval / Dr Joya Gaskins at [facility]  10/30/2020 referred   Classic Upper airway cough syndrome (previously labeled PNDS),  is so named because it's frequently impossible to sort out how much is  CR/sinusitis with freq throat clearing (which can be related to primary GERD)   vs  causing  secondary (" extra esophageal")  GERD from wide swings in gastric pressure that occur with throat clearing, often  promoting self use of mint and menthol lozenges that reduce the lower esophageal sphincter tone and exacerbate the problem further in a cyclical fashion.   These are the same pts (now being labeled as having "irritable larynx syndrome" by some cough centers) who not infrequently have a history of having failed to tolerate ace inhibitors,  dry powder inhalers or biphosphonates or report having atypical/extraesophageal reflux symptoms that don't respond to standard doses of PPI  and are easily confused as having aecopd or asthma flares by even experienced allergists/ pulmonologists (myself included).    rec as above, no pulmonary f/u needed.   [facility] so referred now to [place] /ENT at the [facility].         Each maintenance medication was reviewed in detail including emphasizing most importantly the difference between maintenance and prns and under what circumstances the prns are to be triggered using an action plan format where appropriate.  Total time for H and P, chart review, counseling,  and generating customized AVS unique to this office visit / same day charting = 50 min

## 2020-11-04 ENCOUNTER — Telehealth: Payer: Self-pay | Admitting: *Deleted

## 2020-11-04 NOTE — Telephone Encounter (Signed)
-----   Message from Tanda Rockers, MD sent at 10/31/2020  5:44 AM EDT ----- Let her know I decided to go ahead and do MBS now

## 2020-11-04 NOTE — Telephone Encounter (Signed)
Spoke with the pt and notified MW ordered the MBS. She verbalized understanding. Test has already been scheduled.

## 2020-11-11 ENCOUNTER — Ambulatory Visit (HOSPITAL_COMMUNITY)
Admission: RE | Admit: 2020-11-11 | Discharge: 2020-11-11 | Disposition: A | Discharge status: Home / Self Care | Payer: Medicaid Other | Source: Ambulatory Visit | Attending: Internal Medicine | Admitting: Internal Medicine

## 2020-11-11 ENCOUNTER — Other Ambulatory Visit: Payer: Self-pay

## 2020-11-11 DIAGNOSIS — R058 Other specified cough: Secondary | ICD-10-CM | POA: Diagnosis present

## 2020-11-11 DIAGNOSIS — R1310 Dysphagia, unspecified: Secondary | ICD-10-CM | POA: Diagnosis present

## 2020-11-11 NOTE — Progress Notes (Signed)
Modified Barium Swallow Progress Note  Patient Details  Name: [name] MRN: [id number] Date of Birth: 1990/10/08  Today's Date: 11/11/2020  Modified Barium Swallow completed.  Full report located under Chart Review in the Imaging Section.  Brief recommendations include the following:  Clinical Impression  Pt exhibits a suspected primary esophageal dysphagia. Oropharyngeal phases were timely, coordinated with adequate ROM of protective mechanisms. No aspiration or residue was observed with larger and challenging straw sip thin or barium pill with thin. All consistencies passed her upper esophageal sphincter followed by esophageal scann although MBS does not diagnose below the UES. It was initially noted that significant amount of solid trial (graham cracker and barium) remained at proximal esophagus only partially cleared with thin barium. Barium pill lodged mid esophagus and puree trial aggregated above and attempted to surpass pill. Esophagus appeared to exhibit significantly weak contractions and pill/applesauce was not effectively cleared. Pill remained mid esophagus despite thin barium trials. Maudry experienced episode of gagging and coughing. Coughing noted at onset prior to po's. Recommend pt continue to consume thin liquids and soft textures as able. Reviewed and discussed esophageal precaution handout many of which pt had implemented independently. It is recommend pt see a GI physician to further assess what appeared to be esophgaeal differences not typically seen in pt's of Neesha's age.   Swallow Evaluation Recommendations   Recommended Consults: Consider GI evaluation   SLP Diet Recommendations: Thin liquid;Other (Comment) (textures she can tolerate/soft)   Liquid Administration via: Cup;Straw   Medication Administration: Whole meds with liquid   Supervision: Patient able to self feed       Postural Changes: Seated upright at 90 degrees;Remain semi-upright after  after feeds/meals (Comment)   Oral Care Recommendations: Oral care BID        Houston Siren 11/11/2020,2:58 PM   Orbie Pyo [place].Ed Risk analyst [phone] Office [phone]

## 2020-11-12 LAB — CBC AND DIFFERENTIAL: Hemoglobin: 8.7 — AB (ref 12.0–16.0)

## 2020-11-12 LAB — BASIC METABOLIC PANEL: Potassium: 5.3 (ref 3.4–5.3)

## 2020-11-12 LAB — COMPREHENSIVE METABOLIC PANEL
Albumin: 3.7 (ref 3.5–5.0)
Calcium: 9.3 (ref 8.7–10.7)

## 2020-11-12 LAB — HEMOGLOBIN A1C: Hemoglobin A1C: 8.4

## 2020-11-12 NOTE — Progress Notes (Signed)
Spoke with pt and notified of results per Dr. Melvyn Novas. Pt verbalized understanding and denied any questions. She states that she prefers to see Dr Joya Gaskins first and will go from there. She is already scheduled appt with Dr Joya Gaskins.

## 2020-12-02 ENCOUNTER — Ambulatory Visit (INDEPENDENT_AMBULATORY_CARE_PROVIDER_SITE_OTHER): Payer: Medicaid Other | Admitting: Nurse Practitioner

## 2020-12-02 ENCOUNTER — Encounter: Payer: Self-pay | Admitting: Nurse Practitioner

## 2020-12-02 ENCOUNTER — Other Ambulatory Visit: Payer: Self-pay | Admitting: Nurse Practitioner

## 2020-12-02 ENCOUNTER — Other Ambulatory Visit: Payer: Self-pay

## 2020-12-02 VITALS — BP 160/100 | HR 85 | Temp 98.4°F | Ht 61.0 in | Wt 138.8 lb

## 2020-12-02 DIAGNOSIS — R55 Syncope and collapse: Secondary | ICD-10-CM | POA: Diagnosis not present

## 2020-12-02 DIAGNOSIS — I10 Essential (primary) hypertension: Secondary | ICD-10-CM

## 2020-12-02 DIAGNOSIS — Z992 Dependence on renal dialysis: Secondary | ICD-10-CM

## 2020-12-02 DIAGNOSIS — E1022 Type 1 diabetes mellitus with diabetic chronic kidney disease: Secondary | ICD-10-CM

## 2020-12-02 DIAGNOSIS — N186 End stage renal disease: Secondary | ICD-10-CM

## 2020-12-02 MED ORDER — AMLODIPINE BESYLATE 10 MG PO TABS: 10.0000 mg | ORAL_TABLET | Freq: Every day | ORAL | 11 refills | Status: DC

## 2020-12-02 MED ORDER — CARVEDILOL 25 MG PO TABS: ORAL_TABLET | ORAL | 1 refills | Status: DC

## 2020-12-02 MED ORDER — GVOKE HYPOPEN 2-PACK 0.5 MG/0.1ML ~~LOC~~ SOAJ: 0.5000 mg | Freq: Every day | SUBCUTANEOUS | 2 refills | Status: DC | PRN
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
## 2020-12-15 ENCOUNTER — Telehealth: Payer: Self-pay

## 2020-12-15 NOTE — Telephone Encounter (Signed)
PA for Gvoke has been submitted we are just waiting on the determination for the patient's ins. YL,RMA

## 2020-12-16 ENCOUNTER — Ambulatory Visit (INDEPENDENT_AMBULATORY_CARE_PROVIDER_SITE_OTHER): Payer: Medicaid Other

## 2020-12-16 ENCOUNTER — Other Ambulatory Visit: Payer: Self-pay | Admitting: Nurse Practitioner

## 2020-12-16 ENCOUNTER — Other Ambulatory Visit: Payer: Self-pay

## 2020-12-16 ENCOUNTER — Ambulatory Visit: Payer: Medicaid Other

## 2020-12-16 VITALS — BP 132/88 | HR 87 | Temp 98.3°F | Ht 61.0 in | Wt 143.4 lb

## 2020-12-16 DIAGNOSIS — Z23 Encounter for immunization: Secondary | ICD-10-CM

## 2020-12-16 DIAGNOSIS — I10 Essential (primary) hypertension: Secondary | ICD-10-CM

## 2020-12-16 MED ORDER — GVOKE HYPOPEN 1-PACK 0.5 MG/0.1ML ~~LOC~~ SOAJ: 0.5000 mg | SUBCUTANEOUS | 2 refills | Status: AC | PRN

## 2020-12-16 NOTE — Progress Notes (Signed)
   Covid-19 Vaccination Clinic  Name:  [name]    MRN: ID:[id number] DOB: 20-Mar-1991  12/16/2020  Ms. Aldaco was observed post Covid-19 immunization for 15 minutes without incident. She was provided with Vaccine Information Sheet and instruction to access the V-Safe system.   Ms. Muffoletto was instructed to call 911 with any severe reactions post vaccine: Difficulty breathing  Swelling of face and throat  A fast heartbeat  A bad rash all over body  Dizziness and weakness   Immunizations Administered     Name Date Dose VIS Date Route   Moderna COVID-19 Vaccine 12/16/2020  2:53 PM 0.5 mL 04/09/2020 Intramuscular   Manufacturer: Moderna   Lot: [id number]   [id number]

## 2020-12-16 NOTE — Progress Notes (Signed)
Pt is here today for a BPC. She takes her medications as directed, carvedilol at night, amlodipine in the morning. She does complain of dizziness. Pt stated she has been trying to walk more as far as exercise. She can not drink as much water as she wants being on dialysis.  BP Readings from Last 3 Encounters:  12/16/20 132/88  12/02/20 (!) 160/100  10/30/20 (!) 150/88   Provider stated BP is much better, keep taking meds as directed and watch salt intake.

## 2020-12-28 ENCOUNTER — Other Ambulatory Visit: Payer: Self-pay | Admitting: Nurse Practitioner

## 2020-12-28 DIAGNOSIS — R55 Syncope and collapse: Secondary | ICD-10-CM

## 2021-01-29 ENCOUNTER — Ambulatory Visit (INDEPENDENT_AMBULATORY_CARE_PROVIDER_SITE_OTHER): Payer: Medicaid Other | Admitting: Nurse Practitioner

## 2021-01-29 ENCOUNTER — Other Ambulatory Visit: Payer: Self-pay

## 2021-01-29 ENCOUNTER — Encounter: Payer: Self-pay | Admitting: Nurse Practitioner

## 2021-01-29 VITALS — BP 150/90 | HR 65 | Temp 98.6°F | Ht 61.0 in | Wt 147.8 lb

## 2021-01-29 DIAGNOSIS — Z992 Dependence on renal dialysis: Secondary | ICD-10-CM

## 2021-01-29 DIAGNOSIS — H6121 Impacted cerumen, right ear: Secondary | ICD-10-CM

## 2021-01-29 DIAGNOSIS — I10 Essential (primary) hypertension: Secondary | ICD-10-CM

## 2021-01-29 DIAGNOSIS — Z79899 Other long term (current) drug therapy: Secondary | ICD-10-CM

## 2021-01-29 DIAGNOSIS — E1022 Type 1 diabetes mellitus with diabetic chronic kidney disease: Secondary | ICD-10-CM

## 2021-01-29 DIAGNOSIS — Z23 Encounter for immunization: Secondary | ICD-10-CM | POA: Diagnosis not present

## 2021-01-29 DIAGNOSIS — N186 End stage renal disease: Secondary | ICD-10-CM

## 2021-01-29 DIAGNOSIS — Z6827 Body mass index (BMI) 27.0-27.9, adult: Secondary | ICD-10-CM

## 2021-01-29 DIAGNOSIS — N6310 Unspecified lump in the right breast, unspecified quadrant: Secondary | ICD-10-CM

## 2021-01-29 DIAGNOSIS — Z0000 Encounter for general adult medical examination without abnormal findings: Secondary | ICD-10-CM

## 2021-01-29 LAB — LIPID PANEL
Chol/HDL Ratio: 1.9 ratio (ref 0.0–4.4)
Cholesterol, Total: 135 mg/dL (ref 100–199)
HDL: 71 mg/dL (ref >39)
LDL Chol Calc (NIH): 53 mg/dL (ref 0–99)
Triglycerides: 51 mg/dL (ref 0–149)
VLDL Cholesterol Cal: 11 mg/dL (ref 5–40)

## 2021-01-29 NOTE — Patient Instructions (Signed)
Health Maintenance, Female Adopting a healthy lifestyle and getting preventive care are important in promoting health and wellness. Ask your health care provider about: The right schedule for you to have regular tests and exams. Things you can do on your own to prevent diseases and keep yourself healthy. What should I know about diet, weight, and exercise? Eat a healthy diet  Eat a diet that includes plenty of vegetables, fruits, low-fat dairy products, and lean protein. Do not eat a lot of foods that are high in solid fats, added sugars, or sodium.  Maintain a healthy weight Body mass index (BMI) is used to identify weight problems. It estimates body fat based on height and weight. Your health care provider can help determineyour BMI and help you achieve or maintain a healthy weight. Get regular exercise Get regular exercise. This is one of the most important things you can do for your health. Most adults should: Exercise for at least 150 minutes each week. The exercise should increase your heart rate and make you sweat (moderate-intensity exercise). Do strengthening exercises at least twice a week. This is in addition to the moderate-intensity exercise. Spend less time sitting. Even light physical activity can be beneficial. Watch cholesterol and blood lipids Have your blood tested for lipids and cholesterol at 30 years of age, then havethis test every 5 years. Have your cholesterol levels checked more often if: Your lipid or cholesterol levels are high. You are older than 30 years of age. You are at high risk for heart disease. What should I know about cancer screening? Depending on your health history and family history, you may need to have cancer screening at various ages. This may include screening for: Breast cancer. Cervical cancer. Colorectal cancer. Skin cancer. Lung cancer. What should I know about heart disease, diabetes, and high blood pressure? Blood pressure and heart  disease High blood pressure causes heart disease and increases the risk of stroke. This is more likely to develop in people who have high blood pressure readings, are of African descent, or are overweight. Have your blood pressure checked: Every 3-5 years if you are 18-39 years of age. Every year if you are 40 years old or older. Diabetes Have regular diabetes screenings. This checks your fasting blood sugar level. Have the screening done: Once every three years after age 40 if you are at a normal weight and have a low risk for diabetes. More often and at a younger age if you are overweight or have a high risk for diabetes. What should I know about preventing infection? Hepatitis B If you have a higher risk for hepatitis B, you should be screened for this virus. Talk with your health care provider to find out if you are at risk forhepatitis B infection. Hepatitis C Testing is recommended for: Everyone born from 1945 through 1965. Anyone with known risk factors for hepatitis C. Sexually transmitted infections (STIs) Get screened for STIs, including gonorrhea and chlamydia, if: You are sexually active and are younger than 30 years of age. You are older than 30 years of age and your health care provider tells you that you are at risk for this type of infection. Your sexual activity has changed since you were last screened, and you are at increased risk for chlamydia or gonorrhea. Ask your health care provider if you are at risk. Ask your health care provider about whether you are at high risk for HIV. Your health care provider may recommend a prescription medicine to help   prevent HIV infection. If you choose to take medicine to prevent HIV, you should first get tested for HIV. You should then be tested every 3 months for as long as you are taking the medicine. Pregnancy If you are about to stop having your period (premenopausal) and you may become pregnant, seek counseling before you get  pregnant. Take 400 to 800 micrograms (mcg) of folic acid every day if you become pregnant. Ask for birth control (contraception) if you want to prevent pregnancy. Osteoporosis and menopause Osteoporosis is a disease in which the bones lose minerals and strength with aging. This can result in bone fractures. If you are 65 years old or older, or if you are at risk for osteoporosis and fractures, ask your health care provider if you should: Be screened for bone loss. Take a calcium or vitamin D supplement to lower your risk of fractures. Be given hormone replacement therapy (HRT) to treat symptoms of menopause. Follow these instructions at home: Lifestyle Do not use any products that contain nicotine or tobacco, such as cigarettes, e-cigarettes, and chewing tobacco. If you need help quitting, ask your health care provider. Do not use street drugs. Do not share needles. Ask your health care provider for help if you need support or information about quitting drugs. Alcohol use Do not drink alcohol if: Your health care provider tells you not to drink. You are pregnant, may be pregnant, or are planning to become pregnant. If you drink alcohol: Limit how much you use to 0-1 drink a day. Limit intake if you are breastfeeding. Be aware of how much alcohol is in your drink. In the [place] one drink equals one 12 oz bottle of beer (355 mL), one 5 oz glass of wine (148 mL), or one 1 oz glass of hard liquor (44 mL). General instructions Schedule regular health, dental, and eye exams. Stay current with your vaccines. Tell your health care provider if: You often feel depressed. You have ever been abused or do not feel safe at home. Summary Adopting a healthy lifestyle and getting preventive care are important in promoting health and wellness. Follow your health care provider's instructions about healthy diet, exercising, and getting tested or screened for diseases. Follow your health care provider's  instructions on monitoring your cholesterol and blood pressure. This information is not intended to replace advice given to you by your health care provider. Make sure you discuss any questions you have with your healthcare provider. Document Revised: 05/31/2018 Document Reviewed: 05/31/2018 Elsevier Patient Education  2022 Elsevier Inc.  
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
## 2021-01-30 LAB — CMP14+EGFR
ALT: 16 IU/L (ref 0–32)
AST: 17 IU/L (ref 0–40)
Albumin/Globulin Ratio: 1.5 (ref 1.2–2.2)
Albumin: 4.1 g/dL (ref 3.9–5.0)
Alkaline Phosphatase: 106 IU/L (ref 44–121)
BUN/Creatinine Ratio: 3 — ABNORMAL LOW (ref 9–23)
BUN: 23 mg/dL — ABNORMAL HIGH (ref 6–20)
Bilirubin Total: 0.5 mg/dL (ref 0.0–1.2)
CO2: 27 mmol/L (ref 20–29)
Calcium: 9.3 mg/dL (ref 8.7–10.2)
Chloride: 94 mmol/L — ABNORMAL LOW (ref 96–106)
Creatinine, Ser: 7.1 mg/dL — ABNORMAL HIGH (ref 0.57–1.00)
Globulin, Total: 2.8 g/dL (ref 1.5–4.5)
Glucose: 238 mg/dL — ABNORMAL HIGH (ref 65–99)
Potassium: 4.5 mmol/L (ref 3.5–5.2)
Sodium: 140 mmol/L (ref 134–144)
Total Protein: 6.9 g/dL (ref 6.0–8.5)
eGFR: 7 mL/min/{1.73_m2} — ABNORMAL LOW (ref >59)

## 2021-01-30 LAB — HEMOGLOBIN A1C
Est. average glucose Bld gHb Est-mCnc: 177 mg/dL
Hgb A1c MFr Bld: 7.8 % — ABNORMAL HIGH (ref 4.8–5.6)

## 2021-01-30 LAB — CBC
Hematocrit: 37.6 % (ref 34.0–46.6)
Hemoglobin: 11.5 g/dL (ref 11.1–15.9)
MCH: 28 pg (ref 26.6–33.0)
MCHC: 30.6 g/dL — ABNORMAL LOW (ref 31.5–35.7)
MCV: 92 fL (ref 79–97)
Platelets: 158 10*3/uL (ref 150–450)
RBC: 4.11 x10E6/uL (ref 3.77–5.28)
RDW: 16.5 % — ABNORMAL HIGH (ref 11.7–15.4)
WBC: 2.8 10*3/uL — ABNORMAL LOW (ref 3.4–10.8)

## 2021-02-10 ENCOUNTER — Ambulatory Visit: Payer: Medicaid Other

## 2021-02-10 ENCOUNTER — Other Ambulatory Visit: Payer: Self-pay

## 2021-02-10 DIAGNOSIS — Z23 Encounter for immunization: Secondary | ICD-10-CM

## 2021-02-10 NOTE — Progress Notes (Signed)
   Covid-19 Vaccination Clinic  Name:  [name]    MRN: ID:[id number] DOB: Sep 04, 1990  02/10/2021  Ms. Butcher was observed post Covid-19 immunization for 15 minutes without incident. She was provided with Vaccine Information Sheet and instruction to access the V-Safe system.   Ms. Quinones was instructed to call 911 with any severe reactions post vaccine: Difficulty breathing  Swelling of face and throat  A fast heartbeat  A bad rash all over body  Dizziness and weakness   Immunizations Administered     Name Date Dose VIS Date Route   Moderna Covid-19 Booster Vaccine 02/10/2021  2:42 PM 0.25 mL 04/09/2020 Intramuscular   Manufacturer: Moderna   Lot: [id number]   [id number]

## 2021-03-15 ENCOUNTER — Encounter: Payer: Self-pay | Admitting: Nurse Practitioner

## 2021-03-18 ENCOUNTER — Other Ambulatory Visit: Payer: Self-pay | Admitting: Nurse Practitioner

## 2021-03-18 DIAGNOSIS — N6310 Unspecified lump in the right breast, unspecified quadrant: Secondary | ICD-10-CM

## 2021-04-08 DIAGNOSIS — R1319 Other dysphagia: Secondary | ICD-10-CM | POA: Insufficient documentation
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
## 2021-04-14 ENCOUNTER — Ambulatory Visit (INDEPENDENT_AMBULATORY_CARE_PROVIDER_SITE_OTHER): Payer: Medicaid Other | Admitting: Vascular Surgery

## 2021-04-14 ENCOUNTER — Encounter: Payer: Self-pay | Admitting: Vascular Surgery

## 2021-04-14 ENCOUNTER — Other Ambulatory Visit: Payer: Self-pay

## 2021-04-14 ENCOUNTER — Ambulatory Visit (HOSPITAL_COMMUNITY)
Admission: RE | Admit: 2021-04-14 | Discharge: 2021-04-14 | Disposition: A | Discharge status: Home / Self Care | Payer: Medicaid Other | Source: Ambulatory Visit | Attending: Vascular Surgery | Admitting: Vascular Surgery

## 2021-04-14 VITALS — BP 137/84 | HR 82 | Temp 98.0°F | Resp 20 | Ht 61.0 in | Wt 147.0 lb

## 2021-04-14 DIAGNOSIS — N186 End stage renal disease: Secondary | ICD-10-CM | POA: Insufficient documentation

## 2021-04-14 DIAGNOSIS — Z992 Dependence on renal dialysis: Secondary | ICD-10-CM

## 2021-04-23 ENCOUNTER — Other Ambulatory Visit: Payer: Self-pay

## 2021-04-23 ENCOUNTER — Ambulatory Visit
Admission: RE | Admit: 2021-04-23 | Discharge: 2021-04-23 | Disposition: A | Discharge status: Home / Self Care | Payer: Medicaid Other | Source: Ambulatory Visit | Attending: Nurse Practitioner | Admitting: Nurse Practitioner

## 2021-04-23 DIAGNOSIS — N6310 Unspecified lump in the right breast, unspecified quadrant: Secondary | ICD-10-CM

## 2021-04-28 DIAGNOSIS — R054 Cough syncope: Secondary | ICD-10-CM | POA: Insufficient documentation

## 2021-04-28 DIAGNOSIS — R55 Syncope and collapse: Secondary | ICD-10-CM

## 2021-04-28 HISTORY — DX: Syncope and collapse: R55

## 2021-05-25 DIAGNOSIS — D849 Immunodeficiency, unspecified: Secondary | ICD-10-CM | POA: Insufficient documentation

## 2021-06-03 DIAGNOSIS — Z4822 Encounter for aftercare following kidney transplant: Secondary | ICD-10-CM | POA: Insufficient documentation

## 2021-06-10 ENCOUNTER — Encounter: Payer: Self-pay | Admitting: Nurse Practitioner

## 2021-07-10 ENCOUNTER — Encounter: Payer: Self-pay | Admitting: Nurse Practitioner

## 2021-07-24 DIAGNOSIS — Z940 Kidney transplant status: Secondary | ICD-10-CM | POA: Insufficient documentation

## 2021-08-06 ENCOUNTER — Encounter: Payer: Self-pay | Admitting: Nurse Practitioner

## 2021-11-05 ENCOUNTER — Encounter: Payer: Self-pay | Admitting: Nurse Practitioner

## 2021-11-06 ENCOUNTER — Encounter: Payer: Self-pay | Admitting: Nurse Practitioner

## 2021-12-24 DIAGNOSIS — R768 Other specified abnormal immunological findings in serum: Secondary | ICD-10-CM | POA: Insufficient documentation

## 2022-02-01 ENCOUNTER — Encounter: Payer: Medicaid Other | Admitting: Nurse Practitioner

## 2022-03-23 DIAGNOSIS — Z961 Presence of intraocular lens: Secondary | ICD-10-CM | POA: Insufficient documentation

## 2022-03-23 DIAGNOSIS — H40053 Ocular hypertension, bilateral: Secondary | ICD-10-CM | POA: Insufficient documentation

## 2022-03-23 DIAGNOSIS — H31012 Macula scars of posterior pole (postinflammatory) (post-traumatic), left eye: Secondary | ICD-10-CM | POA: Insufficient documentation

## 2022-03-23 DIAGNOSIS — H3581 Retinal edema: Secondary | ICD-10-CM | POA: Insufficient documentation

## 2022-03-23 DIAGNOSIS — E113311 Type 2 diabetes mellitus with moderate nonproliferative diabetic retinopathy with macular edema, right eye: Secondary | ICD-10-CM | POA: Insufficient documentation

## 2022-04-21 ENCOUNTER — Encounter: Payer: Self-pay | Admitting: Nurse Practitioner

## 2022-04-21 ENCOUNTER — Ambulatory Visit (INDEPENDENT_AMBULATORY_CARE_PROVIDER_SITE_OTHER): Payer: Medicaid Other | Admitting: Nurse Practitioner

## 2022-04-21 VITALS — BP 160/98 | Temp 98.9°F | Ht 61.0 in | Wt 133.0 lb

## 2022-04-21 DIAGNOSIS — Z940 Kidney transplant status: Secondary | ICD-10-CM

## 2022-04-21 DIAGNOSIS — N186 End stage renal disease: Secondary | ICD-10-CM

## 2022-04-21 DIAGNOSIS — Z992 Dependence on renal dialysis: Secondary | ICD-10-CM | POA: Diagnosis not present

## 2022-04-21 DIAGNOSIS — I10 Essential (primary) hypertension: Secondary | ICD-10-CM

## 2022-04-21 DIAGNOSIS — E1022 Type 1 diabetes mellitus with diabetic chronic kidney disease: Secondary | ICD-10-CM | POA: Diagnosis not present

## 2022-04-21 MED ORDER — AMLODIPINE BESYLATE 5 MG PO TABS: 5.0000 mg | ORAL_TABLET | Freq: Every day | ORAL | 11 refills | Status: AC

## 2022-04-21 NOTE — Patient Instructions (Signed)
Hypertension, Adult High blood pressure (hypertension) is when the force of blood pumping through the arteries is too strong. The arteries are the blood vessels that carry blood from the heart throughout the body. Hypertension forces the heart to work harder to pump blood and may cause arteries to become narrow or stiff. Untreated or uncontrolled hypertension can lead to a heart attack, heart failure, a stroke, kidney disease, and other problems. A blood pressure reading consists of a higher number over a lower number. Ideally, your blood pressure should be below 120/80. The first ("top") number is called the systolic pressure. It is a measure of the pressure in your arteries as your heart beats. The second ("bottom") number is called the diastolic pressure. It is a measure of the pressure in your arteries as the heart relaxes. What are the causes? The exact cause of this condition is not known. There are some conditions that result in high blood pressure. What increases the risk? Certain factors may make you more likely to develop high blood pressure. Some of these risk factors are under your control, including: Smoking. Not getting enough exercise or physical activity. Being overweight. Having too much fat, sugar, calories, or salt (sodium) in your diet. Drinking too much alcohol. Other risk factors include: Having a personal history of heart disease, diabetes, high cholesterol, or kidney disease. Stress. Having a family history of high blood pressure and high cholesterol. Having obstructive sleep apnea. Age. The risk increases with age. What are the signs or symptoms? High blood pressure may not cause symptoms. Very high blood pressure (hypertensive crisis) may cause: Headache. Fast or irregular heartbeats (palpitations). Shortness of breath. Nosebleed. Nausea and vomiting. Vision changes. Severe chest pain, dizziness, and seizures. How is this diagnosed? This condition is diagnosed by  measuring your blood pressure while you are seated, with your arm resting on a flat surface, your legs uncrossed, and your feet flat on the floor. The cuff of the blood pressure monitor will be placed directly against the skin of your upper arm at the level of your heart. Blood pressure should be measured at least twice using the same arm. Certain conditions can cause a difference in blood pressure between your right and left arms. If you have a high blood pressure reading during one visit or you have normal blood pressure with other risk factors, you may be asked to: Return on a different day to have your blood pressure checked again. Monitor your blood pressure at home for 1 week or longer. If you are diagnosed with hypertension, you may have other blood or imaging tests to help your health care provider understand your overall risk for other conditions. How is this treated? This condition is treated by making healthy lifestyle changes, such as eating healthy foods, exercising more, and reducing your alcohol intake. You may be referred for counseling on a healthy diet and physical activity. Your health care provider may prescribe medicine if lifestyle changes are not enough to get your blood pressure under control and if: Your systolic blood pressure is above 130. Your diastolic blood pressure is above 80. Your personal target blood pressure may vary depending on your medical conditions, your age, and other factors. Follow these instructions at home: Eating and drinking  Eat a diet that is high in fiber and potassium, and low in sodium, added sugar, and fat. An example of this eating plan is called the DASH diet. DASH stands for Dietary Approaches to Stop Hypertension. To eat this way: Eat   plenty of fresh fruits and vegetables. Try to fill one half of your plate at each meal with fruits and vegetables. Eat whole grains, such as whole-wheat pasta, brown rice, or whole-grain bread. Fill about one  fourth of your plate with whole grains. Eat or drink low-fat dairy products, such as skim milk or low-fat yogurt. Avoid fatty cuts of meat, processed or cured meats, and poultry with skin. Fill about one fourth of your plate with lean proteins, such as fish, chicken without skin, beans, eggs, or tofu. Avoid pre-made and processed foods. These tend to be higher in sodium, added sugar, and fat. Reduce your daily sodium intake. Many people with hypertension should eat less than 1,500 mg of sodium a day. Do not drink alcohol if: Your health care provider tells you not to drink. You are pregnant, may be pregnant, or are planning to become pregnant. If you drink alcohol: Limit how much you have to: 0-1 drink a day for women. 0-2 drinks a day for men. Know how much alcohol is in your drink. In the [place] one drink equals one 12 oz bottle of beer (355 mL), one 5 oz glass of wine (148 mL), or one 1 oz glass of hard liquor (44 mL). Lifestyle  Work with your health care provider to maintain a healthy body weight or to lose weight. Ask what an ideal weight is for you. Get at least 30 minutes of exercise that causes your heart to beat faster (aerobic exercise) most days of the week. Activities may include walking, swimming, or biking. Include exercise to strengthen your muscles (resistance exercise), such as Pilates or lifting weights, as part of your weekly exercise routine. Try to do these types of exercises for 30 minutes at least 3 days a week. Do not use any products that contain nicotine or tobacco. These products include cigarettes, chewing tobacco, and vaping devices, such as e-cigarettes. If you need help quitting, ask your health care provider. Monitor your blood pressure at home as told by your health care provider. Keep all follow-up visits. This is important. Medicines Take over-the-counter and prescription medicines only as told by your health care provider. Follow directions carefully. Blood  pressure medicines must be taken as prescribed. Do not skip doses of blood pressure medicine. Doing this puts you at risk for problems and can make the medicine less effective. Ask your health care provider about side effects or reactions to medicines that you should watch for. Contact a health care provider if you: Think you are having a reaction to a medicine you are taking. Have headaches that keep coming back (recurring). Feel dizzy. Have swelling in your ankles. Have trouble with your vision. Get help right away if you: Develop a severe headache or confusion. Have unusual weakness or numbness. Feel faint. Have severe pain in your chest or abdomen. Vomit repeatedly. Have trouble breathing. These symptoms may be an emergency. Get help right away. Call 911. Do not wait to see if the symptoms will go away. Do not drive yourself to the hospital. Summary Hypertension is when the force of blood pumping through your arteries is too strong. If this condition is not controlled, it may put you at risk for serious complications. Your personal target blood pressure may vary depending on your medical conditions, your age, and other factors. For most people, a normal blood pressure is less than 120/80. Hypertension is treated with lifestyle changes, medicines, or a combination of both. Lifestyle changes include losing weight, eating a healthy,   low-sodium diet, exercising more, and limiting alcohol. This information is not intended to replace advice given to you by your health care provider. Make sure you discuss any questions you have with your health care provider. Document Revised: 04/14/2021 Document Reviewed: 04/14/2021 Elsevier Patient Education  2023 Elsevier Inc.  

## 2022-04-21 NOTE — Progress Notes (Addendum)
I,Tianna Badgett,acting as a Education administrator for [employer], FNP.,have documented all relevant documentation on the behalf of Minette Brine, FNP,as directed by  Minette Brine, FNP while in the presence of Minette Brine, [place].  Subjective:     Patient ID: [name] , female    DOB: [birth year] , 31 y.o.   MRN: [id number]   Chief Complaint  Patient presents with   Hypertension    HPI  Patient presents today for HTN follow up. 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 Her blood pressure was low and they decreased her amlodipine 5 mg . She reports it will go up around this time because she takes her meds at night. She seen her transplant team Oct 18th and sees them monthly. She is doing well. She is no longer on dialysis. At home in the morning blood pressure is 130-140/70's. Sometimes it will be low and she will not take the medication. Her carvedilol was stopped in early October.   She is getting her flu shot at her next appt with Transplant team  Hypertension This is a chronic problem. The current episode started more than 1 year ago.     Past Medical History:  Diagnosis Date   Cardiac arrest ([place]) 51/7616   Complication of anesthesia    Diabetes mellitus type 1 ([place])    2005 dx [facility]   DKA (diabetic ketoacidoses)    ESRD (end stage renal disease) ([place])    TTS HD, dialyzed at [facility]   Hypertension    PONV (postoperative nausea and vomiting)    Stroke ([place]) 03/2014     Family History  Problem Relation Age of Onset   Diabetes Maternal Grandmother    Stroke Maternal Grandmother    Cancer Maternal Aunt        aunt, passed last month, ?RCC   Cancer Maternal Uncle      Current Outpatient Medications:    acetaminophen (TYLENOL) 500 MG tablet, Take 1,000 mg by mouth every 6 (six) hours as needed for headache (pain)., Disp: , Rfl:    cinacalcet (SENSIPAR) 30 MG tablet, Take 30 mg by mouth See admin instructions. Take one tablet (30 mg) by mouth every Tuesday,  Thursday, Saturday after dialysis in the evening., Disp: , Rfl:    Glucagon (GVOKE HYPOPEN 1-PACK) 0.5 MG/0.1ML SOAJ, Inject 0.5 mg into the skin as needed., Disp: 0.1 mL, Rfl: 2   insulin aspart (NOVOLOG) 100 UNIT/ML injection, Inject 0-9 Units into the skin 3 (three) times daily with meals. (Patient taking differently: Inject 5 Units into the skin 3 (three) times daily with meals.), Disp: 10 mL, Rfl: 11   insulin glargine (LANTUS) 100 UNIT/ML injection, Inject 0.22 mLs (22 Units total) into the skin at bedtime. (Patient taking differently: Inject 15 Units into the skin at bedtime.), Disp: 10 mL, Rfl: 0   Insulin Pen Needle (B-D ULTRAFINE III SHORT PEN) 31G X 8 MM MISC, USE WITH INSULIN FOUR TIMES DAILY, Disp: , Rfl:    ketorolac (ACULAR) 0.5 % ophthalmic solution, Apply to eye., Disp: , Rfl:    multivitamin (RENA-VIT) TABS tablet, Take 1 tablet by mouth at bedtime. (Patient taking differently: Take 1 tablet by mouth daily after lunch.), Disp: 30 tablet, Rfl: 0   ondansetron (ZOFRAN ODT) 4 MG disintegrating tablet, Take 1 tablet (4 mg total) by mouth every 8 (eight) hours as needed for nausea or vomiting., Disp: 10 tablet, Rfl: 0   pantoprazole (PROTONIX) 40 MG tablet, Take 1 tablet (40  mg total) by mouth daily. Take 30-60 min before first meal of the day, Disp: 30 tablet, Rfl: 2   promethazine (PHENERGAN) 25 MG tablet, Take 1 tablet (25 mg total) by mouth every 6 (six) hours as needed for nausea or vomiting., Disp: 12 tablet, Rfl: 0   sevelamer carbonate (RENVELA) 800 MG tablet, Take 800 mg by mouth with breakfast, with lunch, and with evening meal. , Disp: , Rfl:    amLODipine (NORVASC) 5 MG tablet, Take 1 tablet (5 mg total) by mouth daily., Disp: 30 tablet, Rfl: 11   No Known Allergies   Review of Systems  Constitutional: Negative.   Respiratory: Negative.    Cardiovascular: Negative.   Gastrointestinal: Negative.   Neurological: Negative.      Today's Vitals   04/21/22 1458  BP: (!)  160/98  Temp: 98.9 F (37.2 C)  TempSrc: Oral  Weight: 133 lb (60.3 kg)  Height: 5\' 1"  (1.549 m)   Body mass index is 25.13 kg/m.   Objective:  Physical Exam Vitals reviewed.  Constitutional:      General: She is not in acute distress.    Appearance: Normal appearance.  Cardiovascular:     Rate and Rhythm: Normal rate and regular rhythm.     Pulses: Normal pulses.     Heart sounds: Normal heart sounds. No murmur heard. Pulmonary:     Effort: Pulmonary effort is normal. No respiratory distress.     Breath sounds: Normal breath sounds.  Neurological:     General: No focal deficit present.     Mental Status: She is alert and oriented to person, place, and time.     Cranial Nerves: No cranial nerve deficit.     Motor: No weakness.  Psychiatric:        Mood and Affect: Mood normal.        Behavior: Behavior normal.        Thought Content: Thought content normal.        Judgment: Judgment normal.         Assessment And Plan:     1. Essential hypertension Comments: Blood pressure is elevated this visit. She needs to call transplant team to see about increasing her treatment of bp. - amLODipine (NORVASC) 5 MG tablet; Take 1 tablet (5 mg total) by mouth daily.  Dispense: 30 tablet; Refill: 11  2. Type 1 diabetes mellitus with chronic kidney disease on chronic dialysis [facility]) Comments: Continue f/u with Endocrinology, last HgbA1c was 7.8 at [facility] in September  3. History of kidney transplant Comments: She had done in December 2022 and is doing well overall. Continue f/u with [facility] transplant team     Patient was given opportunity to ask questions. Patient verbalized understanding of the plan and was able to repeat key elements of the plan. All questions were answered to their satisfaction.  Minette Brine, FNP   I, Minette Brine, FNP, have reviewed all documentation for this visit. The documentation on 04/21/22 for the exam, diagnosis, procedures, and orders are all accurate  and complete.   IF YOU HAVE BEEN REFERRED TO A SPECIALIST, IT MAY TAKE 1-2 WEEKS TO SCHEDULE/PROCESS THE REFERRAL. IF YOU HAVE NOT HEARD FROM US/SPECIALIST IN TWO WEEKS, PLEASE GIVE [place] A CALL AT [phone].   THE PATIENT IS ENCOURAGED TO PRACTICE SOCIAL DISTANCING DUE TO THE COVID-19 PANDEMIC.

## 2022-08-23 ENCOUNTER — Encounter: Payer: Self-pay | Admitting: Nurse Practitioner

## 2022-08-23 ENCOUNTER — Ambulatory Visit (INDEPENDENT_AMBULATORY_CARE_PROVIDER_SITE_OTHER): Payer: Medicaid Other | Admitting: Nurse Practitioner

## 2022-08-23 VITALS — BP 128/68 | HR 101 | Temp 99.0°F | Ht 61.0 in | Wt 134.0 lb

## 2022-08-23 DIAGNOSIS — Z0000 Encounter for general adult medical examination without abnormal findings: Secondary | ICD-10-CM

## 2022-08-23 DIAGNOSIS — D649 Anemia, unspecified: Secondary | ICD-10-CM | POA: Diagnosis not present

## 2022-08-23 DIAGNOSIS — I10 Essential (primary) hypertension: Secondary | ICD-10-CM | POA: Diagnosis not present

## 2022-08-23 DIAGNOSIS — Z136 Encounter for screening for cardiovascular disorders: Secondary | ICD-10-CM

## 2022-08-23 DIAGNOSIS — E1065 Type 1 diabetes mellitus with hyperglycemia: Secondary | ICD-10-CM | POA: Diagnosis not present

## 2022-08-23 DIAGNOSIS — Z13220 Encounter for screening for lipoid disorders: Secondary | ICD-10-CM

## 2022-08-23 DIAGNOSIS — Z1321 Encounter for screening for nutritional disorder: Secondary | ICD-10-CM

## 2022-08-23 DIAGNOSIS — Z940 Kidney transplant status: Secondary | ICD-10-CM | POA: Insufficient documentation

## 2022-08-23 NOTE — Progress Notes (Signed)
I,Sheena H Holbrook,acting as a Education administrator for Minette Brine, FNP.,have documented all relevant documentation on the behalf of Minette Brine, FNP,as directed by  Minette Brine, FNP while in the presence of Minette Brine, [place].   Subjective:     Patient ID: [name] , female    DOB: 10-11-1990 , 32 y.o.   MRN: [id number]   Chief Complaint  Patient presents with   Annual Exam    HPI  Patient presents today for annual exam.  She is now seeing a Nephrologist in [place] at [facility]  She is due to see the Transplant Team in December. Last HgbA1c was 7.7 in November, she is due to go back in May 2024. She goes to Dr. 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   Hypertension    Noncompliance 12/06/2013   PONV (postoperative nausea and vomiting)    Stroke ([place]) 03/2014   Syncope, tussive 04/28/2021     Family History  Problem Relation Age of Onset   Diabetes Maternal Grandmother    Stroke Maternal Grandmother    Cancer Maternal Aunt        aunt, passed last month, ?RCC   Cancer Maternal Uncle      Current Outpatient Medications:    acetaminophen (TYLENOL) 500 MG tablet, Take 1,000 mg by mouth every 6 (six) hours as needed for headache (pain)., Disp: , Rfl:    amLODipine (NORVASC) 5 MG tablet, Take 1 tablet (5 mg total) by mouth daily., Disp: 30 tablet, Rfl: 11   cinacalcet (SENSIPAR) 30 MG tablet, Take 30 mg by mouth See admin instructions. Take one tablet  (30 mg) by mouth every Tuesday, Thursday, Saturday after dialysis in the evening., Disp: , Rfl:    Glucagon (GVOKE HYPOPEN 1-PACK) 0.5 MG/0.1ML SOAJ, Inject 0.5 mg into the skin as needed., Disp: 0.1 mL, Rfl: 2   insulin aspart (NOVOLOG) 100 UNIT/ML injection, Inject 0-9 Units into the skin 3 (three) times daily with meals. (Patient taking differently: Inject 5 Units into the skin 3 (three) times daily with meals.), Disp: 10 mL, Rfl: 11   insulin glargine (LANTUS) 100 UNIT/ML injection, Inject 0.22 mLs (22 Units total) into the skin at bedtime. (Patient taking differently: Inject 15 Units into the skin at bedtime.), Disp: 10 mL, Rfl: 0   Insulin Pen Needle (B-D ULTRAFINE III SHORT PEN) 31G X 8 MM MISC, USE WITH INSULIN FOUR TIMES DAILY, Disp: , Rfl:    ketorolac (ACULAR) 0.5 % ophthalmic solution, Apply to eye., Disp: , Rfl:    multivitamin (RENA-VIT) TABS tablet, Take 1 tablet by mouth at bedtime. (Patient taking differently: Take 1 tablet by mouth daily after lunch.), Disp: 30 tablet, Rfl: 0   ondansetron (ZOFRAN ODT) 4 MG disintegrating tablet, Take 1 tablet (4 mg total) by mouth every 8 (eight) hours as needed for nausea or vomiting., Disp: 10 tablet, Rfl: 0   pantoprazole (PROTONIX) 40 MG tablet, Take 1 tablet (  40 mg total) by mouth daily. Take 30-60 min before first meal of the day, Disp: 30 tablet, Rfl: 2   promethazine (PHENERGAN) 25 MG tablet, Take 1 tablet (25 mg total) by mouth every 6 (six) hours as needed for nausea or vomiting., Disp: 12 tablet, Rfl: 0   sevelamer carbonate (RENVELA) 800 MG tablet, Take 800 mg by mouth with breakfast, with lunch, and with evening meal. , Disp: , Rfl:    tacrolimus (PROGRAF) 1 MG capsule, Take 1 mg by mouth 2 (two) times daily., Disp: , Rfl:    No Known Allergies    The patient states she uses none for birth control.  Patient's last menstrual period was 07/27/2022.. Negative for Dysmenorrhea and Negative for Menorrhagia. Negative for: breast discharge,  breast lump(s), breast pain and breast self exam. Associated symptoms include abnormal vaginal bleeding. Pertinent negatives include abnormal bleeding (hematology), anxiety, decreased libido, depression, difficulty falling sleep, dyspareunia, history of infertility, nocturia, sexual dysfunction, sleep disturbances, urinary incontinence, urinary urgency, vaginal discharge and vaginal itching. Diet diabetic diet. She tries to stay away from salt. No longer doing a renal diet. The patient states her exercise level is minimal - she is working with instacart and not lifting more than 6 lbs. Her goal is to walk 6000 steps a day.   The patient's tobacco use is:  Social History   Tobacco Use  Smoking Status Never  Smokeless Tobacco Never   She has been exposed to passive smoke. The patient's alcohol use is:  Social History   Substance and Sexual Activity  Alcohol Use No   Alcohol/week: 0.0 standard drinks of alcohol   Additional information: Last pap 02/06/2020, next one scheduled for 02/06/2023.    Review of Systems  Constitutional: Negative.   HENT: Negative.    Eyes: Negative.   Respiratory: Negative.    Cardiovascular: Negative.   Gastrointestinal: Negative.   Endocrine: Negative.   Genitourinary: Negative.   Musculoskeletal: Negative.   Skin: Negative.   Allergic/Immunologic: Negative.   Neurological: Negative.   Hematological: Negative.   Psychiatric/Behavioral: Negative.       Today's Vitals   08/23/22 1457  BP: 128/68  Pulse: (!) 101  Temp: 99 F (37.2 C)  TempSrc: Oral  SpO2: 96%  Weight: 134 lb (60.8 kg)  Height: 5\' 1"  (1.549 m)   Body mass index is 25.32 kg/m.   Objective:  Physical Exam Vitals reviewed.  Constitutional:      General: She is not in acute distress.    Appearance: Normal appearance. She is well-developed and normal weight.  HENT:     Head: Normocephalic and atraumatic.     Right Ear: Hearing, tympanic membrane, ear canal and external ear  normal. There is no impacted cerumen.     Left Ear: Hearing, tympanic membrane, ear canal and external ear normal. There is no impacted cerumen.     Nose: Nose normal.     Mouth/Throat:     Mouth: Mucous membranes are moist.  Eyes:     General: Lids are normal.     Extraocular Movements: Extraocular movements intact.     Conjunctiva/sclera: Conjunctivae normal.     Pupils: Pupils are equal, round, and reactive to light.     Funduscopic exam:    Right eye: No papilledema.        Left eye: No papilledema.  Neck:     Thyroid: No thyroid mass.     Vascular: No carotid bruit.  Cardiovascular:     Rate  and Rhythm: Normal rate and regular rhythm.     Pulses: Normal pulses.     Heart sounds: Normal heart sounds. No murmur heard.    Arteriovenous access: Left arteriovenous access is present.    Comments: Positive thrill and bruit Pulmonary:     Effort: Pulmonary effort is normal. No respiratory distress.     Breath sounds: Normal breath sounds. No wheezing.  Chest:     Chest wall: No mass.  Breasts:    Tanner Score is 5.     Right: Normal. No mass or tenderness.     Left: Normal. No mass or tenderness.     Comments:   Abdominal:     General: Abdomen is flat. Bowel sounds are normal. There is no distension.     Palpations: Abdomen is soft.     Tenderness: There is no abdominal tenderness.  Genitourinary:    Comments: Followed by GYN Musculoskeletal:        General: No swelling. Normal range of motion.     Cervical back: Full passive range of motion without pain, normal range of motion and neck supple.     Right lower leg: No edema.     Left lower leg: No edema.  Lymphadenopathy:     Upper Body:     Right upper body: No supraclavicular, axillary or pectoral adenopathy.     Left upper body: No supraclavicular, axillary or pectoral adenopathy.  Skin:    General: Skin is warm and dry.     Capillary Refill: Capillary refill takes less than 2 seconds.  Neurological:     General: No  focal deficit present.     Mental Status: She is alert and oriented to person, place, and time.     Cranial Nerves: No cranial nerve deficit.     Sensory: No sensory deficit.     Motor: No weakness.  Psychiatric:        Mood and Affect: Mood normal.        Behavior: Behavior normal.        Thought Content: Thought content normal.        Judgment: Judgment normal.         Assessment And Plan:     1. Encounter for health maintenance examination Behavior modifications discussed and diet history reviewed.   Pt will continue to exercise regularly and modify diet with low GI, plant based foods and decrease intake of processed foods.  Recommend intake of daily multivitamin, Vitamin D, and calcium.  Recommend self breast exam for preventive screenings, as well as recommend immunizations that include influenza, TDAP  2. Encounter for vitamin deficiency screening - VITAMIN D 25 Hydroxy (Vit-D Deficiency, Fractures)  3. Encounter for lipid screening for cardiovascular disease - Lipid panel  4. Hyperglycemia due to type 1 diabetes mellitus ([place]) Comments: Last HgbA1c was 7.7 in November, continue f/u with Endocrinology. They are managing her Hgba1c and glucose via Dexcom. No recent changes to medications. - Hemoglobin A1c - Urine microalbumin-creatinine with uACR - Comprehensive metabolic panel with eGFR (no eGFR if sent to [facility])  5. Normocytic normochromic anemia Comments: Will check blood levels. - CBC  6. Essential hypertension Comments: EKG done with NSR HR 99, blood pressure is well controlled. Continue current medications - EKG 12-Lead  7. History of kidney transplant Overall she is doing well.   Patient was given opportunity to ask questions. Patient verbalized understanding of the plan and was able to repeat key elements of the plan. All questions  were answered to their satisfaction.   Minette Brine, FNP   I, Minette Brine, FNP, have reviewed all documentation for this  visit. The documentation on 08/23/22 for the exam, diagnosis, procedures, and orders are all accurate and complete.   THE PATIENT IS ENCOURAGED TO PRACTICE SOCIAL DISTANCING DUE TO THE COVID-19 PANDEMIC.

## 2022-08-24 LAB — CBC
Hematocrit: 38.9 % (ref 34.0–46.6)
Hemoglobin: 12.4 g/dL (ref 11.1–15.9)
MCH: 27.2 pg (ref 26.6–33.0)
MCHC: 31.9 g/dL (ref 31.5–35.7)
MCV: 85 fL (ref 79–97)
Platelets: 226 10*3/uL (ref 150–450)
RBC: 4.56 x10E6/uL (ref 3.77–5.28)
RDW: 13.1 % (ref 11.7–15.4)
WBC: 6.8 10*3/uL (ref 3.4–10.8)

## 2022-08-24 LAB — HEMOGLOBIN A1C
Est. average glucose Bld gHb Est-mCnc: 166 mg/dL
Hgb A1c MFr Bld: 7.4 % — ABNORMAL HIGH (ref 4.8–5.6)

## 2022-08-24 LAB — MICROALBUMIN / CREATININE URINE RATIO
Creatinine, Urine: 115.6 mg/dL
Microalb/Creat Ratio: 43 mg/g creat — ABNORMAL HIGH (ref 0–29)
Microalbumin, Urine: 49.4 ug/mL

## 2022-08-24 LAB — COMPREHENSIVE METABOLIC PANEL
ALT: 15 IU/L (ref 0–32)
AST: 10 IU/L (ref 0–40)
Albumin/Globulin Ratio: 1.8 (ref 1.2–2.2)
Albumin: 4.7 g/dL (ref 3.9–4.9)
Alkaline Phosphatase: 104 IU/L (ref 44–121)
BUN/Creatinine Ratio: 14 (ref 9–23)
BUN: 15 mg/dL (ref 6–20)
Bilirubin Total: 0.3 mg/dL (ref 0.0–1.2)
CO2: 18 mmol/L — ABNORMAL LOW (ref 20–29)
Calcium: 9.3 mg/dL (ref 8.7–10.2)
Chloride: 104 mmol/L (ref 96–106)
Creatinine, Ser: 1.09 mg/dL — ABNORMAL HIGH (ref 0.57–1.00)
Globulin, Total: 2.6 g/dL (ref 1.5–4.5)
Glucose: 210 mg/dL — ABNORMAL HIGH (ref 70–99)
Potassium: 4.2 mmol/L (ref 3.5–5.2)
Sodium: 138 mmol/L (ref 134–144)
Total Protein: 7.3 g/dL (ref 6.0–8.5)
eGFR: 70 mL/min/{1.73_m2} (ref >59)

## 2022-08-24 LAB — LIPID PANEL
Chol/HDL Ratio: 2.1 ratio (ref 0.0–4.4)
Cholesterol, Total: 172 mg/dL (ref 100–199)
HDL: 83 mg/dL (ref >39)
LDL Chol Calc (NIH): 75 mg/dL (ref 0–99)
Triglycerides: 73 mg/dL (ref 0–149)
VLDL Cholesterol Cal: 14 mg/dL (ref 5–40)

## 2022-08-24 LAB — VITAMIN D 25 HYDROXY (VIT D DEFICIENCY, FRACTURES): Vit D, 25-Hydroxy: 6.4 ng/mL — ABNORMAL LOW (ref 30.0–100.0)

## 2022-11-05 ENCOUNTER — Emergency Department (HOSPITAL_COMMUNITY)
Admission: EM | Admit: 2022-11-05 | Discharge: 2022-11-06 | Disposition: A | Discharge status: Home / Self Care | Payer: No Typology Code available for payment source | Attending: Emergency Medicine | Admitting: Emergency Medicine

## 2022-11-05 ENCOUNTER — Other Ambulatory Visit: Payer: Self-pay

## 2022-11-05 ENCOUNTER — Encounter (HOSPITAL_COMMUNITY): Payer: Self-pay

## 2022-11-05 DIAGNOSIS — N186 End stage renal disease: Secondary | ICD-10-CM | POA: Insufficient documentation

## 2022-11-05 DIAGNOSIS — I120 Hypertensive chronic kidney disease with stage 5 chronic kidney disease or end stage renal disease: Secondary | ICD-10-CM | POA: Insufficient documentation

## 2022-11-05 DIAGNOSIS — Z992 Dependence on renal dialysis: Secondary | ICD-10-CM | POA: Insufficient documentation

## 2022-11-05 DIAGNOSIS — R109 Unspecified abdominal pain: Secondary | ICD-10-CM | POA: Insufficient documentation

## 2022-11-05 DIAGNOSIS — E1022 Type 1 diabetes mellitus with diabetic chronic kidney disease: Secondary | ICD-10-CM | POA: Insufficient documentation

## 2022-11-05 DIAGNOSIS — Z8673 Personal history of transient ischemic attack (TIA), and cerebral infarction without residual deficits: Secondary | ICD-10-CM | POA: Diagnosis not present

## 2022-11-05 MED ORDER — ACETAMINOPHEN 500 MG PO TABS
1000.0000 mg | ORAL_TABLET | Freq: Once | ORAL | Status: AC
  Administered 2022-11-06: 1000 mg via ORAL
  Filled 2022-11-05: qty 2
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
## 2022-11-05 NOTE — ED Triage Notes (Signed)
Pt. Arrives c/o flank pain that radiates to the abdomen. Pt. States that she was in an [facility] Wednesday and has had pain since. She states that the pain is similar to what she had when she had her kidney transplant and wants to make sure that the graft is okay.

## 2022-11-06 ENCOUNTER — Emergency Department (HOSPITAL_COMMUNITY): Payer: No Typology Code available for payment source

## 2022-11-06 LAB — URINALYSIS, ROUTINE W REFLEX MICROSCOPIC
Bilirubin Urine: NEGATIVE
Glucose, UA: 50 mg/dL — AB
Hgb urine dipstick: NEGATIVE
Ketones, ur: NEGATIVE mg/dL
Nitrite: POSITIVE — AB
Protein, ur: NEGATIVE mg/dL
Specific Gravity, Urine: 1.014 (ref 1.005–1.030)
pH: 5 (ref 5.0–8.0)

## 2022-11-06 LAB — COMPREHENSIVE METABOLIC PANEL
ALT: 15 U/L (ref 0–44)
AST: 14 U/L — ABNORMAL LOW (ref 15–41)
Albumin: 3.7 g/dL (ref 3.5–5.0)
Alkaline Phosphatase: 70 U/L (ref 38–126)
Anion gap: 6 (ref 5–15)
BUN: 16 mg/dL (ref 6–20)
CO2: 23 mmol/L (ref 22–32)
Calcium: 9.1 mg/dL (ref 8.9–10.3)
Chloride: 106 mmol/L (ref 98–111)
Creatinine, Ser: 1.31 mg/dL — ABNORMAL HIGH (ref 0.44–1.00)
GFR, Estimated: 56 mL/min — ABNORMAL LOW (ref >60)
Glucose, Bld: 133 mg/dL — ABNORMAL HIGH (ref 70–99)
Potassium: 4.2 mmol/L (ref 3.5–5.1)
Sodium: 135 mmol/L (ref 135–145)
Total Bilirubin: 0.4 mg/dL (ref 0.3–1.2)
Total Protein: 6.9 g/dL (ref 6.5–8.1)

## 2022-11-06 LAB — CBC
HCT: 40.4 % (ref 36.0–46.0)
Hemoglobin: 11.8 g/dL — ABNORMAL LOW (ref 12.0–15.0)
MCH: 26.8 pg (ref 26.0–34.0)
MCHC: 29.2 g/dL — ABNORMAL LOW (ref 30.0–36.0)
MCV: 91.8 fL (ref 80.0–100.0)
Platelets: 187 10*3/uL (ref 150–400)
RBC: 4.4 MIL/uL (ref 3.87–5.11)
RDW: 12.8 % (ref 11.5–15.5)
WBC: 4.2 10*3/uL (ref 4.0–10.5)
nRBC: 0 % (ref 0.0–0.2)

## 2022-11-06 MED ORDER — CEPHALEXIN 500 MG PO CAPS: 500.0000 mg | ORAL_CAPSULE | Freq: Four times a day (QID) | ORAL | 0 refills | Status: AC

## 2022-11-06 NOTE — Discharge Instructions (Signed)
You were evaluated in the Emergency Department and after careful evaluation, we did not find any emergent condition requiring admission or further testing in the hospital.  Your exam/testing today is overall reassuring.  You may have a urinary tract infection.  Please take the Keflex antibiotic as directed.  Use Tylenol as needed for pain at home.  Please return to the Emergency Department if you experience any worsening of your condition.   Thank you for allowing [place] to be a part of your care.

## 2022-11-06 NOTE — ED Notes (Signed)
Pt ambulated from ed with steady gait. Pt verbalizes understanding of discharge instructions. Pt family to drive home

## 2022-12-27 ENCOUNTER — Ambulatory Visit: Payer: Medicaid Other | Admitting: Nurse Practitioner

## 2023-06-06 ENCOUNTER — Encounter: Payer: Self-pay | Admitting: Nurse Practitioner

## 2023-06-06 NOTE — Telephone Encounter (Signed)
 Care team updated and letter sent for eye exam notes.

## 2023-10-17 ENCOUNTER — Ambulatory Visit: Attending: Surgery | Admitting: Physician Assistant

## 2023-10-17 VITALS — BP 157/90 | HR 98 | Temp 97.9°F | Ht 61.0 in | Wt 134.6 lb

## 2023-10-17 DIAGNOSIS — T82848A Pain from vascular prosthetic devices, implants and grafts, initial encounter: Secondary | ICD-10-CM | POA: Insufficient documentation

## 2023-10-17 DIAGNOSIS — Z940 Kidney transplant status: Secondary | ICD-10-CM | POA: Diagnosis not present

## 2023-10-17 NOTE — Progress Notes (Signed)
 Established Dialysis Access   History of Present Illness   [name] is a 33 y.o. (03/26/1991) female who presents for evaluation of her Left AV fistula. She has previously undergone Plication/revision in November of 2021 by Dr. Edgardo Goodwill. This was her second plication. Her fistula was initially created at [facility] in 2018. Since her last visit here she received a renal transplant in December of 2022. This has been working well for her. She recently started having pain in her left AV fistula over past several months. The pain is in axilla as well as throughout fistula. Sometimes she says pain is minimal and last just few seconds, other times she rates it a 6/10. The pain generally when its worse stays longer. 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 The pain she says is more just an annoyance. She denies any pain, weakness or coldness in her hand.  She currently dialyzes on TTS at [facility] location  Current Outpatient Medications  Medication Sig Dispense Refill   acetaminophen  (TYLENOL ) 500 MG tablet Take 1,000 mg by mouth every 6 (six) hours as needed for headache (pain).     Glucagon  (GVOKE HYPOPEN  1-PACK) 0.5 MG/0.1ML SOAJ Inject 0.5 mg into the skin as needed. 0.1 mL 2   insulin  aspart (NOVOLOG ) 100 UNIT/ML injection Inject 0-9 Units into the skin 3 (three) times daily with meals. 10 mL 11   insulin  glargine (LANTUS ) 100 UNIT/ML injection Inject 0.22 mLs (22 Units total) into the skin at bedtime. (Patient taking differently: Inject 15 Units into the skin at bedtime.) 10 mL 0   Insulin  Pen Needle (B-D ULTRAFINE III SHORT PEN) 31G X 8 MM MISC USE WITH INSULIN  FOUR TIMES DAILY     ketorolac  (ACULAR ) 0.5 % ophthalmic solution Apply to eye.     multivitamin (RENA-VIT) TABS tablet Take 1 tablet by mouth at bedtime. (Patient taking differently: Take 1 tablet by mouth daily after lunch.) 30 tablet 0   ondansetron  (ZOFRAN  ODT) 4 MG disintegrating tablet Take 1  tablet (4 mg total) by mouth every 8 (eight) hours as needed for nausea or vomiting. 10 tablet 0   pantoprazole  (PROTONIX ) 40 MG tablet Take 1 tablet (40 mg total) by mouth daily. Take 30-60 min before first meal of the day 30 tablet 2   tacrolimus (PROGRAF) 1 MG capsule Take 1 mg by mouth 2 (two) times daily.     amLODipine  (NORVASC ) 5 MG tablet Take 1 tablet (5 mg total) by mouth daily. 30 tablet 11   cinacalcet  (SENSIPAR ) 30 MG tablet Take 30 mg by mouth See admin instructions. Take one tablet (30 mg) by mouth every Tuesday, Thursday, Saturday after dialysis in the evening. (Patient not taking: Reported on 10/17/2023)     promethazine  (PHENERGAN ) 25 MG tablet Take 1 tablet (25 mg total) by mouth every 6 (six) hours as needed for nausea or vomiting. (Patient not taking: Reported on 10/17/2023) 12 tablet 0   sevelamer carbonate (RENVELA) 800 MG tablet Take 800 mg by mouth with breakfast, with lunch, and with evening meal.  (Patient not taking: Reported on 10/17/2023)     No current facility-administered medications for this visit.    On ROS today: negative unless stated in HPI   Physical Examination   Vitals:   10/17/23 1326  BP: (!) 157/90  Pulse: 98  Temp: 97.9 F (36.6 C)  TempSrc: Temporal  SpO2: 97%  Weight: 134 lb 9.6 oz (61.1 kg)  Height: 5\' 1"  (  1.549 m)   Body mass index is 25.43 kg/m.  General Well appearing, well nourished in no distress  Pulmonary Non labored  Cardiac regular  Vascular Left radial pulse 2+, hand warm and well perfused, fistula with good thrill. 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 female who presents with concerns of pain in her left AV fistula. She is currently not on dialysis because she received a kidney transplant in  December of 2022. Over past several months she has been having pain in her left AV fistula. Pain waxes and wanes. Her pain is currently tolerable. I discussed with her concerns of giving her contrast dye to better image her fistula to see why she may be experiencing pain due to her kidney transplant. The other option would be to abort her functioning fistula and either excise or ligate it. She would prefer to keep her fistula functioning as long as she can and she also feels her symptoms are presently manageable. No intervention is indicate at this time. She will call for follow up if at any point it becomes unbearable or if she has any other new or concerning symptoms   Deneen Finical, PA-C [facility] Office: [phone]  Clinic MD: Charlotte Cookey

## 2024-03-30 LAB — LAB REPORT - SCANNED
Albumin, Urine POC: 242
Albumin/Creatinine Ratio, Urine, POC: 119
Creatinine, POC: 202.9 mg/dL
EGFR: 68
